# Patient Record
Sex: Female | Born: 1945 | Race: White | Hispanic: No | Marital: Married | Smoking: Never smoker
Health system: Southern US, Community
[De-identification: ages and names within clinical notes are randomized; demographics above are authoritative.]

## PROBLEM LIST (undated history)

## (undated) DIAGNOSIS — R569 Unspecified convulsions: Secondary | ICD-10-CM

## (undated) DIAGNOSIS — K219 Gastro-esophageal reflux disease without esophagitis: Secondary | ICD-10-CM

## (undated) DIAGNOSIS — F32A Depression, unspecified: Secondary | ICD-10-CM

## (undated) DIAGNOSIS — R000 Tachycardia, unspecified: Secondary | ICD-10-CM

## (undated) DIAGNOSIS — M3500 Sicca syndrome, unspecified: Secondary | ICD-10-CM

## (undated) DIAGNOSIS — I7300 Raynaud's syndrome without gangrene: Secondary | ICD-10-CM

## (undated) DIAGNOSIS — M1990 Unspecified osteoarthritis, unspecified site: Secondary | ICD-10-CM

## (undated) DIAGNOSIS — I10 Essential (primary) hypertension: Secondary | ICD-10-CM

## (undated) DIAGNOSIS — J189 Pneumonia, unspecified organism: Secondary | ICD-10-CM

## (undated) DIAGNOSIS — I517 Cardiomegaly: Secondary | ICD-10-CM

## (undated) DIAGNOSIS — E039 Hypothyroidism, unspecified: Secondary | ICD-10-CM

## (undated) HISTORY — PX: SALPINGOOPHORECTOMY: SHX82

## (undated) HISTORY — DX: Tachycardia, unspecified: R00.0

## (undated) HISTORY — PX: WISDOM TOOTH EXTRACTION: SHX21

## (undated) HISTORY — DX: Raynaud's syndrome without gangrene: I73.00

## (undated) HISTORY — DX: Depression, unspecified: F32.A

## (undated) HISTORY — DX: Unspecified convulsions: R56.9

## (undated) HISTORY — DX: Unspecified osteoarthritis, unspecified site: M19.90

## (undated) HISTORY — PX: NASAL SEPTUM SURGERY: SHX37

## (undated) HISTORY — DX: Pneumonia, unspecified organism: J18.9

## (undated) HISTORY — DX: Sjogren syndrome, unspecified: M35.00

## (undated) HISTORY — DX: Cardiomegaly: I51.7

## (undated) HISTORY — DX: Hypothyroidism, unspecified: E03.9

## (undated) HISTORY — PX: OVARIAN CYST REMOVAL: SHX89

---

## 1998-04-05 ENCOUNTER — Other Ambulatory Visit: Admission: RE | Admit: 1998-04-05 | Discharge: 1998-04-05 | Payer: Self-pay | Admitting: Obstetrics and Gynecology

## 1999-05-12 ENCOUNTER — Encounter: Payer: Self-pay | Admitting: Emergency Medicine

## 1999-05-12 ENCOUNTER — Emergency Department (HOSPITAL_COMMUNITY): Admission: EM | Admit: 1999-05-12 | Discharge: 1999-05-12 | Payer: Self-pay | Admitting: Emergency Medicine

## 1999-05-22 ENCOUNTER — Encounter: Payer: Self-pay | Admitting: Otolaryngology

## 1999-05-22 ENCOUNTER — Encounter: Admission: RE | Admit: 1999-05-22 | Discharge: 1999-05-22 | Payer: Self-pay | Admitting: Otolaryngology

## 1999-07-03 ENCOUNTER — Other Ambulatory Visit
Admission: RE | Admit: 1999-07-03 | Discharge: 1999-07-03 | Payer: Self-pay | Admitting: Physical Medicine & Rehabilitation

## 2000-08-11 ENCOUNTER — Other Ambulatory Visit: Admission: RE | Admit: 2000-08-11 | Discharge: 2000-08-11 | Payer: Self-pay | Admitting: Obstetrics and Gynecology

## 2000-08-14 ENCOUNTER — Encounter: Payer: Self-pay | Admitting: Obstetrics and Gynecology

## 2000-08-14 ENCOUNTER — Encounter: Admission: RE | Admit: 2000-08-14 | Discharge: 2000-08-14 | Payer: Self-pay | Admitting: Obstetrics and Gynecology

## 2001-08-26 ENCOUNTER — Other Ambulatory Visit: Admission: RE | Admit: 2001-08-26 | Discharge: 2001-08-26 | Payer: Self-pay | Admitting: Obstetrics and Gynecology

## 2002-10-21 ENCOUNTER — Encounter: Payer: Self-pay | Admitting: Internal Medicine

## 2002-10-21 ENCOUNTER — Ambulatory Visit (HOSPITAL_COMMUNITY): Admission: RE | Admit: 2002-10-21 | Discharge: 2002-10-21 | Payer: Self-pay | Admitting: Internal Medicine

## 2003-04-02 ENCOUNTER — Emergency Department (HOSPITAL_COMMUNITY): Admission: EM | Admit: 2003-04-02 | Discharge: 2003-04-02 | Payer: Self-pay | Admitting: Emergency Medicine

## 2003-04-09 ENCOUNTER — Emergency Department (HOSPITAL_COMMUNITY): Admission: EM | Admit: 2003-04-09 | Discharge: 2003-04-10 | Payer: Self-pay | Admitting: Emergency Medicine

## 2003-04-19 ENCOUNTER — Other Ambulatory Visit: Admission: RE | Admit: 2003-04-19 | Discharge: 2003-04-19 | Payer: Self-pay | Admitting: Obstetrics and Gynecology

## 2003-05-15 ENCOUNTER — Encounter: Admission: RE | Admit: 2003-05-15 | Discharge: 2003-05-15 | Payer: Self-pay | Admitting: Obstetrics and Gynecology

## 2004-04-05 ENCOUNTER — Emergency Department (HOSPITAL_COMMUNITY): Admission: EM | Admit: 2004-04-05 | Discharge: 2004-04-05 | Payer: Self-pay | Admitting: Emergency Medicine

## 2005-02-20 ENCOUNTER — Other Ambulatory Visit: Admission: RE | Admit: 2005-02-20 | Discharge: 2005-02-20 | Payer: Self-pay | Admitting: Obstetrics and Gynecology

## 2005-03-03 ENCOUNTER — Encounter: Admission: RE | Admit: 2005-03-03 | Discharge: 2005-03-03 | Payer: Self-pay | Admitting: Obstetrics and Gynecology

## 2005-10-08 ENCOUNTER — Emergency Department (HOSPITAL_COMMUNITY): Admission: EM | Admit: 2005-10-08 | Discharge: 2005-10-08 | Payer: Self-pay | Admitting: Emergency Medicine

## 2007-02-09 HISTORY — PX: HAND TENDON SURGERY: SHX663

## 2007-09-06 ENCOUNTER — Other Ambulatory Visit: Admission: RE | Admit: 2007-09-06 | Discharge: 2007-09-06 | Payer: Self-pay | Admitting: Obstetrics and Gynecology

## 2007-12-20 ENCOUNTER — Ambulatory Visit: Payer: Self-pay | Admitting: Infectious Disease

## 2007-12-20 DIAGNOSIS — J328 Other chronic sinusitis: Secondary | ICD-10-CM

## 2007-12-20 DIAGNOSIS — L719 Rosacea, unspecified: Secondary | ICD-10-CM | POA: Insufficient documentation

## 2007-12-20 LAB — CONVERTED CEMR LAB
BUN: 18 mg/dL (ref 6–23)
Basophils Relative: 0 % (ref 0–1)
CO2: 22 meq/L (ref 19–32)
Calcium: 9.4 mg/dL (ref 8.4–10.5)
Creatinine, Ser: 1.25 mg/dL — ABNORMAL HIGH (ref 0.40–1.20)
HCT: 45.2 % (ref 36.0–46.0)
Hemoglobin: 14.7 g/dL (ref 12.0–15.0)
Monocytes Absolute: 0.4 10*3/uL (ref 0.1–1.0)
Monocytes Relative: 8 % (ref 3–12)
Neutro Abs: 3.2 10*3/uL (ref 1.7–7.7)
Neutrophils Relative %: 59 % (ref 43–77)
Potassium: 4.3 meq/L (ref 3.5–5.3)
RBC: 4.98 M/uL (ref 3.87–5.11)

## 2008-02-14 ENCOUNTER — Encounter: Admission: RE | Admit: 2008-02-14 | Discharge: 2008-02-14 | Payer: Self-pay | Admitting: Cardiology

## 2008-02-21 ENCOUNTER — Encounter: Admission: RE | Admit: 2008-02-21 | Discharge: 2008-02-21 | Payer: Self-pay | Admitting: Cardiology

## 2008-03-10 ENCOUNTER — Encounter: Admission: RE | Admit: 2008-03-10 | Discharge: 2008-03-10 | Payer: Self-pay | Admitting: Cardiology

## 2010-01-06 DIAGNOSIS — Z8719 Personal history of other diseases of the digestive system: Secondary | ICD-10-CM

## 2010-01-06 HISTORY — DX: Personal history of other diseases of the digestive system: Z87.19

## 2010-01-07 ENCOUNTER — Emergency Department (HOSPITAL_COMMUNITY)
Admission: EM | Admit: 2010-01-07 | Discharge: 2010-01-07 | Payer: Self-pay | Source: Home / Self Care | Admitting: Emergency Medicine

## 2010-02-26 ENCOUNTER — Encounter: Payer: Self-pay | Admitting: Internal Medicine

## 2010-02-26 ENCOUNTER — Ambulatory Visit (INDEPENDENT_AMBULATORY_CARE_PROVIDER_SITE_OTHER): Payer: BC Managed Care – PPO | Admitting: Internal Medicine

## 2010-02-26 DIAGNOSIS — G43909 Migraine, unspecified, not intractable, without status migrainosus: Secondary | ICD-10-CM | POA: Insufficient documentation

## 2010-02-26 DIAGNOSIS — S62109A Fracture of unspecified carpal bone, unspecified wrist, initial encounter for closed fracture: Secondary | ICD-10-CM

## 2010-02-26 DIAGNOSIS — R634 Abnormal weight loss: Secondary | ICD-10-CM | POA: Insufficient documentation

## 2010-02-26 DIAGNOSIS — S62102A Fracture of unspecified carpal bone, left wrist, initial encounter for closed fracture: Secondary | ICD-10-CM | POA: Insufficient documentation

## 2010-02-26 DIAGNOSIS — E039 Hypothyroidism, unspecified: Secondary | ICD-10-CM

## 2010-02-26 DIAGNOSIS — I517 Cardiomegaly: Secondary | ICD-10-CM | POA: Insufficient documentation

## 2010-02-26 DIAGNOSIS — D1803 Hemangioma of intra-abdominal structures: Secondary | ICD-10-CM

## 2010-02-26 DIAGNOSIS — K222 Esophageal obstruction: Secondary | ICD-10-CM | POA: Insufficient documentation

## 2010-02-26 MED ORDER — METOPROLOL SUCCINATE ER 25 MG PO TB24: ORAL_TABLET | ORAL | Status: DC

## 2010-02-26 MED ORDER — LEVOTHYROXINE SODIUM 112 MCG PO TABS: 112.0000 ug | ORAL_TABLET | Freq: Every day | ORAL | Status: DC

## 2010-02-26 MED ORDER — OMEPRAZOLE 40 MG PO CPDR: 40.0000 mg | DELAYED_RELEASE_CAPSULE | Freq: Every day | ORAL | Status: DC

## 2010-02-26 NOTE — Assessment & Plan Note (Signed)
Stable dose of synthroid. Obtain tsh.

## 2010-02-26 NOTE — Progress Notes (Signed)
  Subjective:    Patient ID: Christy Moore, female    DOB: June 19, 1945, 65 y.o.   MRN: [id number]  HPI Pt presents to clinic to establish primary care. Notes 1.5 month h/o decreased appetite and ~5lb wt loss. May have noted intermittent slight nausea without emesis and lighter stools.Denis abd pain or blood in stool.  H/o hypothyroidism followed by endocrinology (states physician is retiring) with stable reported tsh and synthroid dose. Last lab evaluation ~6 months ago. H/o migraines with aura taking topamax followed by [facility] neurology. Followed by cardiology with h/o ST and cardiomegaly maintained on low dose beta blocker. Recalls EGD ?12/10 with esophageal stricture s/p dilatation and denies dysphagia. Colonoscopy utd 2y ago with reported 5y followup. Now seeing gyn for pap smears and mammogram utd within 1y and reportedly nl.   Reivewed PMH, PSH, medications, allergies, social hx and family hx.    Review of Systems  Constitutional: Positive for appetite change and unexpected weight change. Negative for fever, chills and fatigue.  HENT: Negative for ear pain, congestion and sore throat.   Eyes: Negative for pain and redness.  Respiratory: Negative for cough and shortness of breath.   Cardiovascular: Negative for chest pain and palpitations.  Gastrointestinal: Positive for nausea. Negative for abdominal pain, constipation and blood in stool.  Genitourinary: Negative for hematuria, decreased urine volume and difficulty urinating.  Musculoskeletal: Negative for back pain and gait problem.  Skin: Negative for color change and rash.  Neurological: Positive for headaches. Negative for dizziness, tremors and seizures.  Hematological: Negative for adenopathy. Does not bruise/bleed easily.  Psychiatric/Behavioral: Negative for behavioral problems and agitation.       Objective:   Physical Exam  Constitutional: She appears well-developed and well-nourished. No distress.  HENT:  Head:  Normocephalic and atraumatic.  Right Ear: External ear normal.  Left Ear: External ear normal.  Nose: Nose normal.  Mouth/Throat: Oropharynx is clear and moist. No oropharyngeal exudate.  Eyes: Conjunctivae and EOM are normal. Pupils are equal, round, and reactive to light. Right eye exhibits no discharge. Left eye exhibits no discharge. No scleral icterus.  Neck: Neck supple. No JVD present. No thyromegaly present.  Cardiovascular: Normal rate, regular rhythm and normal heart sounds.  Exam reveals no gallop and no friction rub.   No murmur heard. Pulmonary/Chest: Effort normal and breath sounds normal. No respiratory distress. She has no wheezes. She has no rales.  Abdominal: Soft. Bowel sounds are normal. She exhibits no distension and no mass. There is no hepatosplenomegaly. There is tenderness in the epigastric area. There is no rigidity, no rebound and no guarding.         Sl tenderness to palpation epigastric area  Musculoskeletal: She exhibits no edema.  Lymphadenopathy:    She has no cervical adenopathy.  Neurological: She is alert. Gait normal.  Skin: Skin is warm and dry. No rash noted. She is not diaphoretic. No erythema.  Psychiatric: She has a normal mood and affect. Her behavior is normal.          Assessment & Plan:

## 2010-02-26 NOTE — Assessment & Plan Note (Signed)
Mild. Associated mild nausea and decreased appetite. Increase omeprazole 40mg  po qd. Obtain cbc, chem7, lft and tsh.

## 2010-02-27 LAB — BASIC METABOLIC PANEL
BUN: 17 mg/dL (ref 6–23)
CO2: 27 mEq/L (ref 19–32)
Chloride: 110 mEq/L (ref 96–112)
Creatinine, Ser: 1 mg/dL (ref 0.4–1.2)
GFR: 60.92 mL/min (ref >60.00)
Glucose, Bld: 83 mg/dL (ref 70–99)
Potassium: 4.8 mEq/L (ref 3.5–5.1)

## 2010-02-27 LAB — CBC WITH DIFFERENTIAL/PLATELET
Basophils Relative: 0.3 % (ref 0.0–3.0)
HCT: 42.8 % (ref 36.0–46.0)
Hemoglobin: 14.6 g/dL (ref 12.0–15.0)
Lymphocytes Relative: 18.4 % (ref 12.0–46.0)
Lymphs Abs: 1.5 10*3/uL (ref 0.7–4.0)
MCHC: 34 g/dL (ref 30.0–36.0)
MCV: 93.5 fl (ref 78.0–100.0)

## 2010-02-27 LAB — TSH: TSH: 0.94 u[IU]/mL (ref 0.35–5.50)

## 2010-02-27 LAB — HEPATIC FUNCTION PANEL: Total Protein: 6.9 g/dL (ref 6.0–8.3)

## 2010-02-28 ENCOUNTER — Telehealth: Payer: Self-pay | Admitting: Internal Medicine

## 2010-02-28 NOTE — Telephone Encounter (Signed)
Triage vm----wants to speak with a nurse about the medication change and the sxs. Please return call.

## 2010-03-01 NOTE — Telephone Encounter (Signed)
Left message on machine return their call

## 2010-03-01 NOTE — Telephone Encounter (Signed)
Left message on machine returning patient's call 

## 2010-03-04 ENCOUNTER — Encounter: Payer: Self-pay | Admitting: *Deleted

## 2010-03-04 ENCOUNTER — Ambulatory Visit: Payer: BC Managed Care – PPO | Admitting: Internal Medicine

## 2010-03-05 ENCOUNTER — Encounter: Payer: Self-pay | Admitting: Internal Medicine

## 2010-03-05 ENCOUNTER — Ambulatory Visit (INDEPENDENT_AMBULATORY_CARE_PROVIDER_SITE_OTHER): Payer: BC Managed Care – PPO | Admitting: Internal Medicine

## 2010-03-05 DIAGNOSIS — R634 Abnormal weight loss: Secondary | ICD-10-CM

## 2010-03-05 MED ORDER — PANTOPRAZOLE SODIUM 40 MG PO TBEC: 40.0000 mg | DELAYED_RELEASE_TABLET | Freq: Every day | ORAL | Status: DC

## 2010-03-05 NOTE — Progress Notes (Signed)
  Subjective:    Patient ID: Christy Moore, female    DOB: Nov 14, 1945, 65 y.o.   MRN: [id number]  HPI  Pt presents to clinic for followup of wt loss. Last visit noted unintentional wt loss, nausea and abdominal discomfort. Omeprazole dose increased to 40mg  qd but pt noted increased gas production and no improvement of sx's. Attempt tums yesterday with resolution of sx's within . This am noted diarrhea without blood. Reviewed nl cbc, chem7, lft and tsh with pt.  Reviewed PMH, medications and allergies.    Review of Systems See HPI     Objective:   Physical Exam  Constitutional: She appears well-developed and well-nourished. No distress.  HENT:  Head: Normocephalic and atraumatic.  Right Ear: External ear normal.  Left Ear: External ear normal.  Eyes: Conjunctivae are normal. No scleral icterus.  Abdominal: Soft. Normal appearance and bowel sounds are normal. She exhibits no distension, no ascites and no mass. There is no hepatosplenomegaly. There is tenderness in the epigastric area and periumbilical area. There is no rigidity, no rebound and no guarding.  Neurological: She is alert.  Skin: Skin is warm and dry. She is not diaphoretic. No erythema.  Psychiatric: She has a normal mood and affect.          Assessment & Plan:

## 2010-03-05 NOTE — Assessment & Plan Note (Signed)
Failed to improve with increased dose of omeprazole but improvement with antacid. Change omeprazole to protonix 40mg  qd. If no significant improvement of sx's within 3 wks will recommend GI consult.

## 2010-03-27 ENCOUNTER — Other Ambulatory Visit: Payer: Self-pay | Admitting: Internal Medicine

## 2010-03-27 ENCOUNTER — Telehealth: Payer: Self-pay | Admitting: *Deleted

## 2010-03-27 DIAGNOSIS — R634 Abnormal weight loss: Secondary | ICD-10-CM

## 2010-03-27 NOTE — Telephone Encounter (Signed)
Pt needs to  Make sure this referral has been made to Dr. Kinnie Scales as that is her MD>

## 2010-03-27 NOTE — Telephone Encounter (Signed)
Pt wants Dr. Rodena Medin to know that the Protonix is helping her pain, but she is still losing weight and has a crawling sensation in her esphagus.  Is taking Prednisonepack  This week and is causing the GERD to be worse.

## 2010-03-27 NOTE — Telephone Encounter (Signed)
Will schedule gi consult as discussed last visit

## 2010-06-04 ENCOUNTER — Ambulatory Visit (INDEPENDENT_AMBULATORY_CARE_PROVIDER_SITE_OTHER): Payer: BC Managed Care – PPO | Admitting: Internal Medicine

## 2010-06-04 ENCOUNTER — Encounter: Payer: Self-pay | Admitting: Internal Medicine

## 2010-06-04 DIAGNOSIS — R634 Abnormal weight loss: Secondary | ICD-10-CM

## 2010-06-09 ENCOUNTER — Encounter: Payer: Self-pay | Admitting: Internal Medicine

## 2010-06-09 NOTE — Progress Notes (Signed)
  Subjective:    Patient ID: DELENA CASEBEER, female    DOB: [birth year], 65 y.o.   MRN: [id number]  HPI Patient presents to clinic for followup of weight loss. Patient now without abdominal pain nausea and weight has stabilized. Status post GI consultation. Stopped clindamycin and believes his symptoms resolved at that point. Laboratory evaluation unrevealing. Colonoscopy up-to-date and EGD December 2010. Blood pressure reviewed and her average control. No current active complaints.    Review of Systems  Constitutional: Negative for fever, fatigue and unexpected weight change.  Gastrointestinal: Negative for nausea, abdominal pain, blood in stool and abdominal distention.  Skin: Negative for color change and rash.       Objective:   Physical Exam    Physical Exam  Vitals reviewed. Constitutional:  appears well-developed and well-nourished. No distress.  HENT:  Head: Normocephalic and atraumatic.  Nose: Nose normal.  Mouth/Throat: Oropharynx is clear and moist. No oropharyngeal exudate.  Eyes: Conjunctivae and EOM are normal. Pupils are equal, round, and reactive to light. Right eye exhibits no discharge. Left eye exhibits no discharge. No scleral icterus.  Neck: Neck supple. No thyromegaly present.  Cardiovascular: Normal rate, regular rhythm and normal heart sounds.  Exam reveals no gallop and no friction rub.   No murmur heard. Pulmonary/Chest: Effort normal and breath sounds normal. No respiratory distress.  has no wheezes.  has no rales.  Lymphadenopathy:   no cervical adenopathy.  Neurological:  is alert.  Skin: Skin is warm and dry.  not diaphoretic.  Psychiatric: normal mood and affect.      Assessment & Plan:

## 2010-06-09 NOTE — Assessment & Plan Note (Signed)
Resolved. Patient relates to possible medication side effect. Continue to monitor weight. Followup as scheduled for routine care

## 2010-11-07 ENCOUNTER — Telehealth: Payer: Self-pay | Admitting: Cardiology

## 2010-11-07 NOTE — Telephone Encounter (Signed)
New message  Pt called. She said her right hand lost blood circulation. She thinks it is reymauds syndrome.  please call her back.

## 2010-11-07 NOTE — Telephone Encounter (Signed)
lm

## 2010-11-07 NOTE — Telephone Encounter (Signed)
Follow up: pt returned your call.  Please call cell [phone]

## 2010-11-08 MED ORDER — AMLODIPINE BESYLATE 2.5 MG PO TABS: 2.5000 mg | ORAL_TABLET | Freq: Every day | ORAL | Status: DC

## 2010-11-08 NOTE — Telephone Encounter (Signed)
Advised patient

## 2010-11-08 NOTE — Telephone Encounter (Signed)
Called stating (R) hand went "white" and numb,cold this AM. 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 Now at 3:45 hand is not numb but is still cold. Thinks she may have Raynaud's syndrome. Is now taking Cambia 50 mg poweder when she gets migranes. States doctors at surgical center suggested she take Norvasc 2.5 instead of Metoprol XL 25 mg 1/2 tab BID. Will route to [place] to talk to Dr. Patty Sermons.

## 2010-11-08 NOTE — Telephone Encounter (Signed)
Agree that amlodipine would be preferable in this situation.

## 2010-12-05 ENCOUNTER — Ambulatory Visit: Payer: BC Managed Care – PPO | Admitting: Internal Medicine

## 2011-02-03 ENCOUNTER — Telehealth: Payer: Self-pay | Admitting: Internal Medicine

## 2011-02-03 NOTE — Telephone Encounter (Signed)
Rx refill denied office visit needed. 

## 2011-02-04 ENCOUNTER — Telehealth: Payer: Self-pay | Admitting: Internal Medicine

## 2011-02-05 MED ORDER — PANTOPRAZOLE SODIUM 40 MG PO TBEC: 40.0000 mg | DELAYED_RELEASE_TABLET | Freq: Every day | ORAL | Status: DC

## 2011-02-05 NOTE — Telephone Encounter (Signed)
Office visit due June 2013.

## 2011-03-17 ENCOUNTER — Telehealth: Payer: Self-pay | Admitting: Family Medicine

## 2011-03-17 MED ORDER — METOPROLOL SUCCINATE ER 25 MG PO TB24: ORAL_TABLET | ORAL | Status: DC

## 2011-03-17 NOTE — Telephone Encounter (Signed)
Refill- metoprolol succ er 25mg  tab. Take 1/2 tablet twice a day. Qty 90 last fill 9.9.12

## 2011-03-17 NOTE — Telephone Encounter (Signed)
Rx refill sent to pharmacy office visit needed for future refills 

## 2011-03-19 ENCOUNTER — Encounter: Payer: Self-pay | Admitting: Family Medicine

## 2011-03-19 ENCOUNTER — Ambulatory Visit (INDEPENDENT_AMBULATORY_CARE_PROVIDER_SITE_OTHER): Payer: BC Managed Care – PPO | Admitting: Family Medicine

## 2011-03-19 VITALS — BP 130/88 | Temp 98.2°F | Wt 156.0 lb

## 2011-03-19 DIAGNOSIS — E039 Hypothyroidism, unspecified: Secondary | ICD-10-CM

## 2011-03-19 DIAGNOSIS — K219 Gastro-esophageal reflux disease without esophagitis: Secondary | ICD-10-CM | POA: Insufficient documentation

## 2011-03-19 DIAGNOSIS — I7300 Raynaud's syndrome without gangrene: Secondary | ICD-10-CM

## 2011-03-19 DIAGNOSIS — H04129 Dry eye syndrome of unspecified lacrimal gland: Secondary | ICD-10-CM

## 2011-03-19 DIAGNOSIS — H04123 Dry eye syndrome of bilateral lacrimal glands: Secondary | ICD-10-CM

## 2011-03-19 DIAGNOSIS — R682 Dry mouth, unspecified: Secondary | ICD-10-CM

## 2011-03-19 DIAGNOSIS — M2550 Pain in unspecified joint: Secondary | ICD-10-CM

## 2011-03-19 DIAGNOSIS — K117 Disturbances of salivary secretion: Secondary | ICD-10-CM

## 2011-03-19 DIAGNOSIS — I10 Essential (primary) hypertension: Secondary | ICD-10-CM | POA: Insufficient documentation

## 2011-03-19 NOTE — Progress Notes (Addendum)
Subjective:    Patient ID: Christy Moore, female    DOB: 1945-12-16, 66 y.o.   MRN: [id number]  HPI  Medical followup. Establishing with me. Past medical history significant for GERD, history of esophageal stricture, hypothyroidism, migraine headaches, rosacea. Medications reviewed. Needs followup lab work. GERD symptoms stable on protonix. Compliant with all medications. Migraines stable. She has history of Raynaud's syndrome which has been improved with amlodipine.  She complains of progressive symptoms of dry eyes and mouth. Also has dry skin. She is concerned about possible Sjogren's syndrome. She has never been tested. No history of parotid enlargement. No recent esophageal problems.  She does have occasional arthralgias which are somewhat bilateral but no objective evidence of inflammation such as redness or warmth.  Past Medical History  Diagnosis Date  . Hypothyroidism   . Sinus tachycardia   . Cardiomegaly    Past Surgical History  Procedure Date  . Hand tendon surgery 02/09/2007    tendon transfer-Dr. Amanda Pea  . Ovarian cyst removal     Right Sided Salpingo-oopherectomy  . Nasal septum surgery     operated on by Dr. Haroldine Laws 20 years ago  . Salpingoophorectomy     reports that she has never smoked. She does not have any smokeless tobacco history on file. She reports that she does not drink alcohol or use illicit drugs. family history includes Alcohol abuse in her maternal grandfather and maternal grandmother; Allergies in her other; and Cancer in her father and mother. Allergies  Allergen Reactions  . Azithromycin     REACTION: nausea, Gi  . Celecoxib     REACTION: nausea  . Ciprofloxacin     REACTION: nausea  . Doxycycline     REACTION: gi upset  . Eggs Or Egg-Derived Products   . Erythromycin     REACTION: nause, GI  . Hydrocodone     REACTION: nausea  . Hydrocodone-Acetaminophen     REACTION: GI upset  . WUJ:[id number]+[id number]+QIONGEXBMW Acid+Aspartame       REACTION: nausea  . Levofloxacin     REACTION: she claims renal failiure  . Propoxyphene N-Acetaminophen   . Rofecoxib     REACTION: nausea  . Tetanus Toxoid       Review of Systems  Constitutional: Positive for fatigue. Negative for fever, chills and appetite change.  HENT: Negative for congestion, sore throat and voice change.   Eyes: Negative for pain, redness and visual disturbance.  Respiratory: Negative for cough and shortness of breath.   Cardiovascular: Negative for chest pain, palpitations and leg swelling.  Gastrointestinal: Negative for nausea, vomiting, abdominal pain and diarrhea.  Genitourinary: Negative for dysuria.  Neurological: Negative for dizziness and headaches.       Objective:   Physical Exam  Constitutional: She is oriented to person, place, and time. She appears well-developed and well-nourished.  HENT:  Mouth/Throat: Oropharynx is clear and moist.  Neck: Neck supple. No thyromegaly present.  Cardiovascular: Normal rate and regular rhythm.   Pulmonary/Chest: Effort normal and breath sounds normal. No respiratory distress. She has no wheezes. She has no rales.  Musculoskeletal: She exhibits no edema and no tenderness.  Lymphadenopathy:    She has no cervical adenopathy.  Neurological: She is alert and oriented to person, place, and time. No cranial nerve deficit.  Skin: No rash noted.  Psychiatric: She has a normal mood and affect. Her behavior is normal.          Assessment & Plan:  #1 hypothyroidism. Recheck TSH #2 history  of GERD with esophageal stricture stable. Continue proton pump inhibitor #3 progressive symptoms of dry eyes and dry mouth. Rule out Sjogren syndrome. Check anti-nuclear antibodies, rheumatoid factor, and anti-SSA and anti-SSB antibodies #4 Raynaud's syndrome stable on calcium channel blocker  Patient has been notified of lab results. Negative rheumatoid factor. Sjogren's antibodies negative positive ANA with high titer  and speckled pattern. Patient has history of Raynaud's disease, arthralgias, and progressive dry eyes and dry mouth. She is requesting a rheumatology referral and we will set up

## 2011-03-20 LAB — BASIC METABOLIC PANEL
BUN: 14 mg/dL (ref 6–23)
Calcium: 9.6 mg/dL (ref 8.4–10.5)
Creatinine, Ser: 0.9 mg/dL (ref 0.4–1.2)
GFR: 66.98 mL/min (ref >60.00)
Potassium: 4.1 mEq/L (ref 3.5–5.1)

## 2011-03-20 LAB — ANTI-NUCLEAR AB-TITER (ANA TITER): ANA Titer 1: >1:2560 {titer} — ABNORMAL HIGH

## 2011-03-20 LAB — SJOGRENS SYNDROME-A EXTRACTABLE NUCLEAR ANTIBODY: SSA (Ro) (ENA) Antibody, IgG: 22 AU/mL (ref <30)

## 2011-03-20 LAB — SJOGRENS SYNDROME-B EXTRACTABLE NUCLEAR ANTIBODY: SSB (La) (ENA) Antibody, IgG: <1 AU/mL (ref <30)

## 2011-03-26 ENCOUNTER — Other Ambulatory Visit: Payer: Self-pay | Admitting: Family Medicine

## 2011-03-26 DIAGNOSIS — E039 Hypothyroidism, unspecified: Secondary | ICD-10-CM

## 2011-03-26 MED ORDER — LEVOTHYROXINE SODIUM 100 MCG PO TABS: 100.0000 ug | ORAL_TABLET | Freq: Every day | ORAL | Status: DC

## 2011-03-26 NOTE — Progress Notes (Signed)
Quick Note:  Pt informed, labs mailed to home future orders placed, Rx sent Pt would like rheumatology referral ______

## 2011-03-27 NOTE — Progress Notes (Signed)
Addended by: Kristian Covey on: 03/27/2011 07:56 AM   Modules accepted: Orders

## 2011-08-08 ENCOUNTER — Other Ambulatory Visit: Payer: Self-pay | Admitting: Internal Medicine

## 2011-08-11 NOTE — Telephone Encounter (Signed)
Refill for 1 year 

## 2011-08-14 ENCOUNTER — Telehealth: Payer: Self-pay | Admitting: Family Medicine

## 2011-08-14 ENCOUNTER — Ambulatory Visit (INDEPENDENT_AMBULATORY_CARE_PROVIDER_SITE_OTHER): Payer: BC Managed Care – PPO | Admitting: Emergency Medicine

## 2011-08-14 VITALS — BP 133/90 | HR 97 | Temp 98.8°F | Resp 18 | Ht 64.0 in | Wt 155.0 lb

## 2011-08-14 DIAGNOSIS — L02419 Cutaneous abscess of limb, unspecified: Secondary | ICD-10-CM

## 2011-08-14 DIAGNOSIS — L03119 Cellulitis of unspecified part of limb: Secondary | ICD-10-CM

## 2011-08-14 MED ORDER — CLINDAMYCIN HCL 300 MG PO CAPS: 300.0000 mg | ORAL_CAPSULE | Freq: Three times a day (TID) | ORAL | Status: AC

## 2011-08-14 NOTE — Telephone Encounter (Signed)
Please advise 

## 2011-08-14 NOTE — Telephone Encounter (Signed)
Pt called and said that she is going to go to an Urgent Care re: foot, since she hasn't rcvd a call back yet. Pt said that she will send Dr Caryl Never info.

## 2011-08-14 NOTE — Telephone Encounter (Signed)
Pt called and said that CAN was not avail. Pt said that she went to see Dr Thomasena Edis -Orthopedic surgeon re: inflamed and swollen lft foot and ankle. Dx with ? Cellulitis or flebitus. Need to get abx asap called in to [facility] on [address].

## 2011-08-14 NOTE — Patient Instructions (Signed)

## 2011-08-14 NOTE — Progress Notes (Signed)
Date:  08/14/2011   Name:  KESHONDA MONSOUR   DOB:  10/12/1945   MRN:  [id number]  PCP:  Kristian Covey, MD    Chief Complaint: Foot Pain   History of Present Illness:  Christy Moore is a 66 y.o. very pleasant female patient who presents with the following:  Awoke with pain in foot and now has redness and warmth.  No fever or chills.  No history of injury.  Patient Active Problem List  Diagnosis  . OTHER CHRONIC SINUSITIS  . Rosacea  . Cardiomegaly  . Hypothyroidism  . Wrist fracture, left  . Liver hemangioma  . Migraine  . Esophageal stricture  . Weight loss  . GERD (gastroesophageal reflux disease)  . Hypertension  . Raynaud's disease    Past Medical History  Diagnosis Date  . Hypothyroidism   . Sinus tachycardia   . Cardiomegaly     Past Surgical History  Procedure Date  . Hand tendon surgery 02/09/2007    tendon transfer-Dr. Amanda Pea  . Ovarian cyst removal     Right Sided Salpingo-oopherectomy  . Nasal septum surgery     operated on by Dr. Haroldine Laws 20 years ago  . Salpingoophorectomy     History  Substance Use Topics  . Smoking status: Never Smoker   . Smokeless tobacco: Not on file  . Alcohol Use: No    Family History  Problem Relation Age of Onset  . Allergies Other     Grandmother-pt states she takes after grandmother who had multiple allergies  . Cancer Mother     breast and lung  . Cancer Father     prostate  . Alcohol abuse Maternal Grandmother   . Alcohol abuse Maternal Grandfather     Allergies  Allergen Reactions  . Amoxicillin-Pot Clavulanate     REACTION: nausea  . Azithromycin     REACTION: nausea, Gi  . Celecoxib     REACTION: nausea  . Ciprofloxacin     REACTION: nausea  . Doxycycline     REACTION: gi upset  . Eggs Or Egg-Derived Products   . Erythromycin     REACTION: nause, GI  . Hydrocodone     REACTION: nausea  . Hydrocodone-Acetaminophen     REACTION: GI upset  . Levofloxacin     REACTION: she claims  renal failiure  . Propoxyphene-Acetaminophen   . Rofecoxib     REACTION: nausea  . Tetanus Toxoid     Medication list has been reviewed and updated.  Current Outpatient Prescriptions on File Prior to Visit  Medication Sig Dispense Refill  . amLODipine (NORVASC) 2.5 MG tablet Take 1 tablet (2.5 mg total) by mouth daily.  90 tablet  3  . levothyroxine (SYNTHROID, LEVOTHROID) 100 MCG tablet Take 1 tablet (100 mcg total) by mouth daily.  90 tablet  3  . pantoprazole (PROTONIX) 40 MG tablet Take 1 tablet (40 mg total) by mouth daily.  30 tablet  11  . Diclofenac Potassium (CAMBIA) 50 MG PACK Take 50 mg by mouth as needed. Powder pack used as needed for migraine headache      . DISCONTD: metoprolol succinate (TOPROL-XL) 25 MG 24 hr tablet 1/2 tablet twice a day  90 tablet  0  . DISCONTD: mupirocin (BACTROBAN) 2 % nasal ointment by Nasal route 2 (two) times daily. Use one-half of tube in each nostril twice daily for five (5) days. After application, press sides of nose together and gently massage.       Marland Kitchen  DISCONTD: topiramate (TOPAMAX) 25 MG tablet Take 25 mg by mouth at bedtime.          Review of Systems:  As per HPI, otherwise negative.    Physical Examination: Filed Vitals:   08/14/11 1803  BP: 133/90  Pulse: 97  Temp: 98.8 F (37.1 C)  Resp: 18   Filed Vitals:   08/14/11 1803  Height: 5\' 4"  (1.626 m)  Weight: 155 lb (70.308 kg)   Body mass index is 26.61 kg/(m^2). Ideal Body Weight: Weight in (lb) to have BMI = 25: 145.3    GEN: WDWN, NAD, Non-toxic, Alert & Oriented x 3 HEENT: Atraumatic, Normocephalic.  Ears and Nose: No external deformity. EXTR: No clubbing/cyanosis/edema.. Cellulitis anterior ankle and lower leg NEURO: Normal gait.  PSYCH: Normally interactive. Conversant. Not depressed or anxious appearing.  Calm demeanor.    Assessment and Plan: Crutches nwb Elevate Clindamycin (allergies) Local heat Follow up as needed and with FMD on Monday  [name], MD

## 2011-08-18 ENCOUNTER — Encounter: Payer: Self-pay | Admitting: Radiology

## 2011-08-18 ENCOUNTER — Ambulatory Visit (INDEPENDENT_AMBULATORY_CARE_PROVIDER_SITE_OTHER): Payer: BC Managed Care – PPO | Admitting: Emergency Medicine

## 2011-08-18 VITALS — BP 110/62 | HR 102 | Temp 98.6°F | Resp 16 | Ht 64.0 in | Wt 156.0 lb

## 2011-08-18 DIAGNOSIS — L03119 Cellulitis of unspecified part of limb: Secondary | ICD-10-CM

## 2011-08-18 NOTE — Progress Notes (Deleted)
  Subjective:    Patient ID: Christy Moore, female    DOB: [birth year], 66 y.o.   MRN: [id number]  HPI    Review of Systems     Objective:   Physical Exam        Assessment & Plan:

## 2011-08-18 NOTE — Progress Notes (Signed)
Date:  08/18/2011   Name:  Christy Moore   DOB:  [birth year]   MRN:  [id number] Gender: female  Age: 66 y.o.  PCP:  Kristian Covey, MD    Chief Complaint: Wound Check   History of Present Illness:  Christy Moore is a 66 y.o. pleasant patient who presents with the following:  Follow up from visit.  Her cellulitis has completely resolved with antibiotics.    Patient Active Problem List  Diagnosis  . OTHER CHRONIC SINUSITIS  . Rosacea  . Cardiomegaly  . Hypothyroidism  . Wrist fracture, left  . Liver hemangioma  . Migraine  . Esophageal stricture  . Weight loss  . GERD (gastroesophageal reflux disease)  . Hypertension  . Raynaud's disease    Past Medical History  Diagnosis Date  . Hypothyroidism   . Sinus tachycardia   . Cardiomegaly     Past Surgical History  Procedure Date  . Hand tendon surgery 02/09/2007    tendon transfer-Dr. Amanda Pea  . Ovarian cyst removal     Right Sided Salpingo-oopherectomy  . Nasal septum surgery     operated on by Dr. Haroldine Laws 20 years ago  . Salpingoophorectomy     History  Substance Use Topics  . Smoking status: Never Smoker   . Smokeless tobacco: Not on file  . Alcohol Use: No    Family History  Problem Relation Age of Onset  . Allergies Other     Grandmother-pt states she takes after grandmother who had multiple allergies  . Cancer Mother     breast and lung  . Cancer Father     prostate  . Alcohol abuse Maternal Grandmother   . Alcohol abuse Maternal Grandfather     Allergies  Allergen Reactions  . Amoxicillin-Pot Clavulanate     REACTION: nausea  . Azithromycin     REACTION: nausea, Gi  . Celecoxib     REACTION: nausea  . Ciprofloxacin     REACTION: nausea  . Doxycycline     REACTION: gi upset  . Eggs Or Egg-Derived Products   . Erythromycin     REACTION: nause, GI  . Hydrocodone     REACTION: nausea  . Hydrocodone-Acetaminophen     REACTION: GI upset  . Levofloxacin     REACTION: she  claims renal failiure  . Propoxyphene-Acetaminophen   . Rofecoxib     REACTION: nausea  . Tetanus Toxoid     Medication list has been reviewed and updated.  Current Outpatient Prescriptions on File Prior to Visit  Medication Sig Dispense Refill  . amLODipine (NORVASC) 2.5 MG tablet Take 1 tablet (2.5 mg total) by mouth daily.  90 tablet  3  . clindamycin (CLEOCIN) 300 MG capsule Take 1 capsule (300 mg total) by mouth 3 (three) times daily.  30 capsule  0  . Diclofenac Potassium (CAMBIA) 50 MG PACK Take 50 mg by mouth as needed. Powder pack used as needed for migraine headache      . levothyroxine (SYNTHROID, LEVOTHROID) 100 MCG tablet Take 1 tablet (100 mcg total) by mouth daily.  90 tablet  3  . pantoprazole (PROTONIX) 40 MG tablet Take 1 tablet (40 mg total) by mouth daily.  30 tablet  11  . DISCONTD: metoprolol succinate (TOPROL-XL) 25 MG 24 hr tablet 1/2 tablet twice a day  90 tablet  0  . DISCONTD: mupirocin (BACTROBAN) 2 % nasal ointment by Nasal route 2 (two) times daily. Use one-half of tube in each  nostril twice daily for five (5) days. After application, press sides of nose together and gently massage.       Marland Kitchen DISCONTD: topiramate (TOPAMAX) 25 MG tablet Take 25 mg by mouth at bedtime.          Review of Systems:  As per HPI, otherwise negative.    Physical Examination: Filed Vitals:   08/18/11 1458  BP: 110/62  Pulse: 102  Temp: 98.6 F (37 C)  Resp: 16   Filed Vitals:   08/18/11 1458  Height: 5\' 4"  (1.626 m)  Weight: 156 lb (70.761 kg)   Body mass index is 26.78 kg/(m^2). Ideal Body Weight: Weight in (lb) to have BMI = 25: 145.3    GEN: WDWN, NAD, Non-toxic, Alert & Oriented x 3 HEENT: Atraumatic, Normocephalic.  Ears and Nose: No external deformity. EXTR: No clubbing/cyanosis/edema NEURO: Normal gait.  PSYCH: Normally interactive. Conversant. Not depressed or anxious appearing.  Calm demeanor.  EXT:  Cellulitis resolved  Assessment and Plan: Resolved  cellulitis  [name], MD

## 2011-09-05 ENCOUNTER — Other Ambulatory Visit: Payer: Self-pay | Admitting: Emergency Medicine

## 2011-09-10 ENCOUNTER — Other Ambulatory Visit: Payer: Self-pay | Admitting: Family Medicine

## 2011-09-10 NOTE — Telephone Encounter (Signed)
Pt is requesting pantoprazole 40 mg #90 with 3 refills [facility] [phone]

## 2011-09-11 MED ORDER — PANTOPRAZOLE SODIUM 40 MG PO TBEC: 40.0000 mg | DELAYED_RELEASE_TABLET | Freq: Every day | ORAL | Status: DC

## 2011-09-19 ENCOUNTER — Ambulatory Visit: Payer: BC Managed Care – PPO | Admitting: Family Medicine

## 2011-09-19 DIAGNOSIS — Z0289 Encounter for other administrative examinations: Secondary | ICD-10-CM

## 2011-09-23 ENCOUNTER — Telehealth: Payer: Self-pay | Admitting: Family Medicine

## 2011-09-23 NOTE — Telephone Encounter (Signed)
Caller: [name]/Patient; Patient Name: Christy Moore; PCP: Evelena Peat [facility] Best Callback Phone Number: [phone]; Reason for call: States that she has an egg allergy and has never been able to get a FLU shot. States that her company has purchased a "new FLU vaccine for people with egg allergies/immunocomprised/over 64 years of age", wants to know if Dr. Caryl Never thinks it would be safe for her to have this administered. OFFICE PLEASE FOLLOW UP WITH PATIENT/QUESTION.

## 2011-09-23 NOTE — Telephone Encounter (Signed)
Pls advise.  

## 2011-09-24 NOTE — Telephone Encounter (Signed)
I would need to know specifics about exactly which vaccine before I would be comfortable saying okay to give. If she to give his name and manufacturer I can research this

## 2011-09-24 NOTE — Telephone Encounter (Signed)
Called and spoke with pt and pt states she will fax the information about the vaccine to the office.

## 2011-10-01 ENCOUNTER — Ambulatory Visit (INDEPENDENT_AMBULATORY_CARE_PROVIDER_SITE_OTHER): Payer: BC Managed Care – PPO | Admitting: Emergency Medicine

## 2011-10-01 VITALS — BP 128/86 | HR 95 | Temp 99.2°F | Resp 16 | Ht 64.5 in | Wt 155.4 lb

## 2011-10-01 DIAGNOSIS — L0291 Cutaneous abscess, unspecified: Secondary | ICD-10-CM

## 2011-10-01 DIAGNOSIS — L0390 Cellulitis, unspecified: Secondary | ICD-10-CM

## 2011-10-01 MED ORDER — CLINDAMYCIN HCL 300 MG PO CAPS: 300.0000 mg | ORAL_CAPSULE | Freq: Three times a day (TID) | ORAL | Status: DC

## 2011-10-01 NOTE — Progress Notes (Signed)
Date:  10/01/2011   Name:  Christy Moore   DOB:  08-20-1945   MRN:  [id number] Gender: female Age: 66 y.o.  PCP:  Kristian Covey, MD    Chief Complaint: Rash   History of Present Illness:  Christy Moore is a 66 y.o. pleasant patient who presents with the following:  History of cellulitis in left leg.  Previously treated with clindamycin (allergic to sulfa).  Responded well and the infection recurred when the clinda was exhausted.  She had a prescription previously filled but not taken for clindamycin and she took that and the infection was eradicated.  Has no history of injury or overuse.  No wounds on foot.  No fever or chills. Just noticed a painful red area on distal anterior lower leg.    Patient Active Problem List  Diagnosis  . OTHER CHRONIC SINUSITIS  . Rosacea  . Cardiomegaly  . Hypothyroidism  . Wrist fracture, left  . Liver hemangioma  . Migraine  . Esophageal stricture  . Weight loss  . GERD (gastroesophageal reflux disease)  . Hypertension  . Raynaud's disease    Past Medical History  Diagnosis Date  . Hypothyroidism   . Sinus tachycardia   . Cardiomegaly     Past Surgical History  Procedure Date  . Hand tendon surgery 02/09/2007    tendon transfer-Dr. Amanda Pea  . Ovarian cyst removal     Right Sided Salpingo-oopherectomy  . Nasal septum surgery     operated on by Dr. Haroldine Laws 20 years ago  . Salpingoophorectomy     History  Substance Use Topics  . Smoking status: Never Smoker   . Smokeless tobacco: Not on file  . Alcohol Use: No    Family History  Problem Relation Age of Onset  . Allergies Other     Grandmother-pt states she takes after grandmother who had multiple allergies  . Cancer Mother     breast and lung  . Cancer Father     prostate  . Alcohol abuse Maternal Grandmother   . Alcohol abuse Maternal Grandfather     Allergies  Allergen Reactions  . Amoxicillin-Pot Clavulanate     REACTION: nausea  . Azithromycin    REACTION: nausea, Gi  . Celecoxib     REACTION: nausea  . Ciprofloxacin     REACTION: nausea  . Doxycycline     REACTION: gi upset  . Eggs Or Egg-Derived Products   . Erythromycin     REACTION: nause, GI  . Hydrocodone     REACTION: nausea  . Hydrocodone-Acetaminophen     REACTION: GI upset  . Levofloxacin     REACTION: she claims renal failiure  . Propoxyphene-Acetaminophen   . Rofecoxib     REACTION: nausea  . Tetanus Toxoid     Medication list has been reviewed and updated.  Outpatient Prescriptions Prior to Visit  Medication Sig Dispense Refill  . amLODipine (NORVASC) 2.5 MG tablet Take 1 tablet (2.5 mg total) by mouth daily.  90 tablet  3  . Diclofenac Potassium (CAMBIA) 50 MG PACK Take 50 mg by mouth as needed. Powder pack used as needed for migraine headache      . levothyroxine (SYNTHROID, LEVOTHROID) 100 MCG tablet Take 1 tablet (100 mcg total) by mouth daily.  90 tablet  3  . pantoprazole (PROTONIX) 40 MG tablet Take 1 tablet (40 mg total) by mouth daily.  90 tablet  3    Review of Systems:  As per HPI,  otherwise negative.    Physical Examination: Filed Vitals:   10/01/11 1654  BP: 128/86  Pulse: 95  Temp: 99.2 F (37.3 C)  Resp: 16   Filed Vitals:   10/01/11 1654  Height: 5' 4.5" (1.638 m)  Weight: 155 lb 6.4 oz (70.489 kg)   Body mass index is 26.26 kg/(m^2). Ideal Body Weight: Weight in (lb) to have BMI = 25: 147.6    GEN: WDWN, NAD, Non-toxic, Alert & Oriented x 3 HEENT: Atraumatic, Normocephalic.  Ears and Nose: No external deformity. EXTR: No clubbing/cyanosis/edema NEURO: Normal gait.  PSYCH: Normally interactive. Conversant. Not depressed or anxious appearing.  Calm demeanor.  LEG:  Area of cellulitis left lower leg.  No wounds on foot.  No FB.    Assessment and Plan: Cellulitis Clindamycin Stop shaving for time hibiclens shower.  [name], MD  I have reviewed and agree with documentation. Robert P. Merla Riches,  M.D.

## 2011-11-21 ENCOUNTER — Telehealth: Payer: Self-pay | Admitting: Cardiology

## 2011-11-21 NOTE — Telephone Encounter (Signed)
[facility] [id number] CALLING FOR REFILL REQUEST FOR NORVASC

## 2011-11-25 ENCOUNTER — Telehealth: Payer: Self-pay | Admitting: Family Medicine

## 2011-11-25 MED ORDER — AMLODIPINE BESYLATE 2.5 MG PO TABS: 2.5000 mg | ORAL_TABLET | Freq: Every day | ORAL | Status: DC

## 2011-11-25 NOTE — Telephone Encounter (Signed)
Refills OK for one year. 

## 2011-11-25 NOTE — Telephone Encounter (Signed)
Patient calling because she needs refills on Amlodipine 2.5mg . This was being rx'd by Dr. Patty Sermons in Cardiology. Per pt, when she started coming to see Dr. Caryl Never, she assumed her meds would just flow over to our office and he would rx, but she never specifically had the conversation with him. Dr. Patty Sermons is no longer managing the amlodipine, and she is out. She has not taken one today.  [facility] [address] - the med is in her chart for viewing.  She wants to know if we will fill it for her. Last OV with [place] 03/19/11 - she has upcoming cpx appt on 01/22/2012. Please advise.

## 2011-11-25 NOTE — Telephone Encounter (Signed)
Pt informed Rx sent. 

## 2012-01-22 ENCOUNTER — Encounter: Payer: Self-pay | Admitting: Family Medicine

## 2012-01-22 ENCOUNTER — Ambulatory Visit (INDEPENDENT_AMBULATORY_CARE_PROVIDER_SITE_OTHER): Payer: BC Managed Care – PPO | Admitting: Family Medicine

## 2012-01-22 VITALS — BP 120/80 | HR 72 | Temp 99.0°F | Resp 12 | Ht 64.25 in | Wt 158.0 lb

## 2012-01-22 DIAGNOSIS — M3500 Sicca syndrome, unspecified: Secondary | ICD-10-CM

## 2012-01-22 DIAGNOSIS — Z0000 Encounter for general adult medical examination without abnormal findings: Secondary | ICD-10-CM

## 2012-01-22 LAB — HEPATIC FUNCTION PANEL
ALT: 14 U/L (ref 0–35)
AST: 21 U/L (ref 0–37)
Albumin: 4.1 g/dL (ref 3.5–5.2)
Total Protein: 7.1 g/dL (ref 6.0–8.3)

## 2012-01-22 LAB — CBC WITH DIFFERENTIAL/PLATELET
Basophils Relative: 0.1 % (ref 0.0–3.0)
Eosinophils Absolute: 0.2 10*3/uL (ref 0.0–0.7)
Eosinophils Relative: 4 % (ref 0.0–5.0)
HCT: 43.1 % (ref 36.0–46.0)
Lymphs Abs: 0.9 10*3/uL (ref 0.7–4.0)
MCHC: 33.3 g/dL (ref 30.0–36.0)
MCV: 91.6 fl (ref 78.0–100.0)
Monocytes Absolute: 0.4 10*3/uL (ref 0.1–1.0)
Neutro Abs: 3.6 10*3/uL (ref 1.4–7.7)
RBC: 4.71 Mil/uL (ref 3.87–5.11)
WBC: 5.1 10*3/uL (ref 4.5–10.5)

## 2012-01-22 LAB — BASIC METABOLIC PANEL
BUN: 16 mg/dL (ref 6–23)
Calcium: 9.2 mg/dL (ref 8.4–10.5)
GFR: 68.56 mL/min (ref >60.00)
Glucose, Bld: 96 mg/dL (ref 70–99)
Potassium: 3.9 mEq/L (ref 3.5–5.1)
Sodium: 141 mEq/L (ref 135–145)

## 2012-01-22 LAB — LIPID PANEL
Cholesterol: 153 mg/dL (ref 0–200)
HDL: 45.9 mg/dL (ref >39.00)

## 2012-01-22 NOTE — Patient Instructions (Signed)
Schedule repeat mammogram

## 2012-01-22 NOTE — Progress Notes (Signed)
Subjective:    Patient ID: Christy Moore, female    DOB: 09/10/1945, 67 y.o.   MRN: [id number]  HPI Patient here for complete physical. Already had flu vaccine. Colonoscopy reportedly 3 years ago. Previous Pneumovax. This was 2 years ago. Previous adverse reaction to tetanus. Shingles vaccine couple years ago. Pap smear 2 years ago. Low risk.  Followed by rheumatologist and she has history of Sjogren's syndrome. She has Raynaud's syndrome which does flare during the winter. Blood pressure treated with amlodipine. Hypothyroidism on levothyroxin. History of esophageal stricture with no current symptomatology.  Past Medical History  Diagnosis Date  . Hypothyroidism   . Sinus tachycardia   . Cardiomegaly    Past Surgical History  Procedure Date  . Hand tendon surgery 02/09/2007    tendon transfer-Dr. Amanda Pea  . Ovarian cyst removal     Right Sided Salpingo-oopherectomy  . Nasal septum surgery     operated on by Dr. Haroldine Laws 20 years ago  . Salpingoophorectomy     reports that she has never smoked. She does not have any smokeless tobacco history on file. She reports that she does not drink alcohol or use illicit drugs. family history includes Alcohol abuse in her maternal grandfather and maternal grandmother; Allergies in her other; and Cancer in her father and mother. Allergies  Allergen Reactions  . Amoxicillin-Pot Clavulanate     REACTION: nausea  . Azithromycin     REACTION: nausea, Gi  . Celecoxib     REACTION: nausea  . Ciprofloxacin     REACTION: nausea  . Doxycycline     REACTION: gi upset  . Eggs Or Egg-Derived Products   . Erythromycin     REACTION: nause, GI  . Hydrocodone     REACTION: nausea  . Hydrocodone-Acetaminophen     REACTION: GI upset  . Levofloxacin     REACTION: she claims renal failiure  . Propoxyphene-Acetaminophen   . Rofecoxib     REACTION: nausea  . Tetanus Toxoid       Review of Systems  Constitutional: Negative for fever, activity  change, appetite change, fatigue and unexpected weight change.  HENT: Negative for hearing loss, ear pain, sore throat and trouble swallowing.   Eyes: Negative for visual disturbance.  Respiratory: Negative for cough and shortness of breath.   Cardiovascular: Negative for chest pain and palpitations.  Gastrointestinal: Negative for abdominal pain, diarrhea, constipation and blood in stool.  Genitourinary: Negative for dysuria and hematuria.  Musculoskeletal: Negative for myalgias, back pain and arthralgias.  Skin: Negative for rash.  Neurological: Negative for dizziness, syncope and headaches.  Hematological: Negative for adenopathy.  Psychiatric/Behavioral: Negative for confusion and dysphoric mood.       Objective:   Physical Exam  Constitutional: She is oriented to person, place, and time. She appears well-developed and well-nourished.  HENT:  Head: Normocephalic and atraumatic.  Eyes: EOM are normal. Pupils are equal, round, and reactive to light.  Neck: Normal range of motion. Neck supple. No thyromegaly present.  Cardiovascular: Normal rate, regular rhythm and normal heart sounds.   No murmur heard. Pulmonary/Chest: Breath sounds normal. No respiratory distress. She has no wheezes. She has no rales.  Abdominal: Soft. Bowel sounds are normal. She exhibits no distension and no mass. There is no tenderness. There is no rebound and no guarding.  Musculoskeletal: Normal range of motion. She exhibits no edema.  Lymphadenopathy:    She has no cervical adenopathy.  Neurological: She is alert and oriented to person, place,  and time. She displays normal reflexes. No cranial nerve deficit.  Skin: No rash noted.  Psychiatric: She has a normal mood and affect. Her behavior is normal. Judgment and thought content normal.          Assessment & Plan:  Complete physical. Screening labs obtained. Vaccines up-to-date with exception of tetanus which she cannot take. Schedule repeat  mammography, will need repeat Pap smear done next year

## 2012-01-23 NOTE — Progress Notes (Signed)
Quick Note:  Pt informed on personally identified VM ______ 

## 2012-01-26 LAB — PROTEIN ELECTROPHORESIS, SERUM
Beta Globulin: 5.9 % (ref 4.7–7.2)
Total Protein, Serum Electrophoresis: 7.1 g/dL (ref 6.0–8.3)

## 2012-01-27 NOTE — Progress Notes (Signed)
Quick Note:  In our research, Rita Ohara ordered, Dr Caryl Never authorizing. Pryor Curia states she never orders additional labs with out asking first. ______

## 2012-02-06 ENCOUNTER — Telehealth: Payer: Self-pay | Admitting: Family Medicine

## 2012-02-06 NOTE — Telephone Encounter (Signed)
Error

## 2012-03-10 ENCOUNTER — Encounter: Payer: Self-pay | Admitting: Cardiology

## 2012-03-17 ENCOUNTER — Encounter: Payer: Self-pay | Admitting: Family

## 2012-03-17 ENCOUNTER — Ambulatory Visit (INDEPENDENT_AMBULATORY_CARE_PROVIDER_SITE_OTHER): Payer: BC Managed Care – PPO | Admitting: Family

## 2012-03-17 VITALS — BP 106/76 | HR 102 | Temp 98.7°F | Wt 156.0 lb

## 2012-03-17 DIAGNOSIS — J069 Acute upper respiratory infection, unspecified: Secondary | ICD-10-CM

## 2012-03-17 DIAGNOSIS — L0390 Cellulitis, unspecified: Secondary | ICD-10-CM

## 2012-03-17 DIAGNOSIS — L0291 Cutaneous abscess, unspecified: Secondary | ICD-10-CM

## 2012-03-17 MED ORDER — CLINDAMYCIN HCL 150 MG PO CAPS: 150.0000 mg | ORAL_CAPSULE | Freq: Three times a day (TID) | ORAL | Status: DC

## 2012-03-17 NOTE — Progress Notes (Signed)
Subjective:    Patient ID: Christy Moore, female    DOB: 1945-12-23, 67 y.o.   MRN: [id number]  HPI 67 year old WF, nonsmoker, is in with c/o laryngitis, ear pain, nasal congestion x 3 days. She has taken Claritin-D that helps but has only taken 1 dose.   Patient has a history of cellulitis of her left lower extremity that originally began in August 2013. She subsequently had another episode in September 2013. She was treated with clindamycin due to her multiple allergies and tolerated the medication reasonably well. Today, she woke up with redness and pain to her left shin area. He has become increasingly warm to touch, more red, and sensitive.   Review of Systems  Constitutional: Negative.  Negative for chills and fatigue.  HENT: Positive for congestion, rhinorrhea and postnasal drip.   Eyes: Negative.   Respiratory: Negative for cough and wheezing.   Cardiovascular: Negative.   Skin: Positive for rash.       Cellulitis of the left lower leg  Allergic/Immunologic: Positive for environmental allergies.  Neurological: Negative.   Hematological: Negative.   Psychiatric/Behavioral: Negative.    Past Medical History  Diagnosis Date  . Hypothyroidism   . Sinus tachycardia   . Cardiomegaly     History   Social History  . Marital Status: Married    Spouse Name: N/A    Number of Children: N/A  . Years of Education: N/A   Occupational History  . Not on file.   Social History Main Topics  . Smoking status: Never Smoker   . Smokeless tobacco: Not on file  . Alcohol Use: No  . Drug Use: No  . Sexually Active:    Other Topics Concern  . Not on file   Social History Narrative  . No narrative on file    Past Surgical History  Procedure Laterality Date  . Hand tendon surgery  02/09/2007    tendon transfer-Dr. Amanda Pea  . Ovarian cyst removal      Right Sided Salpingo-oopherectomy  . Nasal septum surgery      operated on by Dr. Haroldine Laws 20 years ago  .  Salpingoophorectomy      Family History  Problem Relation Age of Onset  . Allergies Other     Grandmother-pt states she takes after grandmother who had multiple allergies  . Cancer Mother     breast and lung  . Cancer Father     prostate  . Alcohol abuse Maternal Grandmother   . Alcohol abuse Maternal Grandfather     Allergies  Allergen Reactions  . Amoxicillin-Pot Clavulanate     REACTION: nausea  . Azithromycin     REACTION: nausea, Gi  . Celecoxib     REACTION: nausea  . Ciprofloxacin     REACTION: nausea  . Doxycycline     REACTION: gi upset  . Eggs Or Egg-Derived Products   . Erythromycin     REACTION: nause, GI  . Hydrocodone     REACTION: nausea  . Hydrocodone-Acetaminophen     REACTION: GI upset  . Levofloxacin     REACTION: she claims renal failiure  . Propoxyphene-Acetaminophen   . Rofecoxib     REACTION: nausea  . Tetanus Toxoid     Current Outpatient Prescriptions on File Prior to Visit  Medication Sig Dispense Refill  . amLODipine (NORVASC) 2.5 MG tablet Take 1 tablet (2.5 mg total) by mouth daily.  90 tablet  3  . Diclofenac Potassium (CAMBIA) 50 MG  PACK Take 50 mg by mouth as needed. Powder pack used as needed for migraine headache      . levothyroxine (SYNTHROID, LEVOTHROID) 100 MCG tablet Take 1 tablet (100 mcg total) by mouth daily.  90 tablet  3  . pantoprazole (PROTONIX) 40 MG tablet Take 1 tablet (40 mg total) by mouth daily.  90 tablet  3  . [DISCONTINUED] metoprolol succinate (TOPROL-XL) 25 MG 24 hr tablet 1/2 tablet twice a day  90 tablet  0  . [DISCONTINUED] mupirocin (BACTROBAN) 2 % nasal ointment by Nasal route 2 (two) times daily. Use one-half of tube in each nostril twice daily for five (5) days. After application, press sides of nose together and gently massage.       . [DISCONTINUED] topiramate (TOPAMAX) 25 MG tablet Take 25 mg by mouth at bedtime.         No current facility-administered medications on file prior to visit.    BP  106/76  Pulse 102  Temp(Src) 98.7 F (37.1 C) (Oral)  Wt 156 lb (70.761 kg)  BMI 26.57 kg/m2  SpO2 97%chart    Objective:   Physical Exam  Constitutional: She is oriented to person, place, and time. She appears well-developed.  HENT:  Right Ear: External ear normal.  Left Ear: External ear normal.  Nose: Nose normal.  Mouth/Throat: Oropharynx is clear and moist.  Neck: Normal range of motion. Neck supple. No thyromegaly present.  Cardiovascular: Normal rate, regular rhythm and normal heart sounds.   Pulmonary/Chest: Effort normal and breath sounds normal.  Neurological: She is alert and oriented to person, place, and time.  Skin: Skin is warm and dry. There is erythema.  Left anterior shin red, tender to touch. No calf tenderness. Pedal pulses 2 out of 2.  Psychiatric: She has a normal mood and affect.          Assessment & Plan:  Assessment:  1. Allergic rhinitis 2. Cellulitis  Plan: 1. Continue Claritin-D twice a day. 2. Clindamycin 150 mg 3 times a day x7 days. Encouraged a probiotic or yogurt daily while she's on the medication. Consider referral to infectious disease if the cellulitis continues to recur. Call the office if symptoms worsen or persist. Recheck as scheduled, and as needed.

## 2012-03-17 NOTE — Patient Instructions (Addendum)
Upper Respiratory Infection, Adult An upper respiratory infection (URI) is also sometimes known as the common cold. The upper respiratory tract includes the nose, sinuses, throat, trachea, and bronchi. Bronchi are the airways leading to the lungs. Most people improve within 1 week, but symptoms can last up to 2 weeks. A residual cough may last even longer.  CAUSES Many different viruses can infect the tissues lining the upper respiratory tract. The tissues become irritated and inflamed and often become very moist. Mucus production is also common. A cold is contagious. You can easily spread the virus to others by oral contact. This includes kissing, sharing a glass, coughing, or sneezing. Touching your mouth or nose and then touching a surface, which is then touched by another person, can also spread the virus. SYMPTOMS  Symptoms typically develop 1 to 3 days after you come in contact with a cold virus. Symptoms vary from person to person. They may include:  Runny nose.  Sneezing.  Nasal congestion.  Sinus irritation.  Sore throat.  Loss of voice (laryngitis).  Cough.  Fatigue.  Muscle aches.  Loss of appetite.  Headache.  Low-grade fever. DIAGNOSIS  You might diagnose your own cold based on familiar symptoms, since most people get a cold 2 to 3 times a year. Your caregiver can confirm this based on your exam. Most importantly, your caregiver can check that your symptoms are not due to another disease such as strep throat, sinusitis, pneumonia, asthma, or epiglottitis. Blood tests, throat tests, and X-rays are not necessary to diagnose a common cold, but they may sometimes be helpful in excluding other more serious diseases. Your caregiver will decide if any further tests are required. RISKS AND COMPLICATIONS  You may be at risk for a more severe case of the common cold if you smoke cigarettes, have chronic heart disease (such as heart failure) or lung disease (such as asthma), or if  you have a weakened immune system. The very young and very old are also at risk for more serious infections. Bacterial sinusitis, middle ear infections, and bacterial pneumonia can complicate the common cold. The common cold can worsen asthma and chronic obstructive pulmonary disease (COPD). Sometimes, these complications can require emergency medical care and may be life-threatening. PREVENTION  The best way to protect against getting a cold is to practice good hygiene. Avoid oral or hand contact with people with cold symptoms. Wash your hands often if contact occurs. There is no clear evidence that vitamin C, vitamin E, echinacea, or exercise reduces the chance of developing a cold. However, it is always recommended to get plenty of rest and practice good nutrition. TREATMENT  Treatment is directed at relieving symptoms. There is no cure. Antibiotics are not effective, because the infection is caused by a virus, not by bacteria. Treatment may include:  Increased fluid intake. Sports drinks offer valuable electrolytes, sugars, and fluids.  Breathing heated mist or steam (vaporizer or shower).  Eating chicken soup or other clear broths, and maintaining good nutrition.  Getting plenty of rest.  Using gargles or lozenges for comfort.  Controlling fevers with ibuprofen or acetaminophen as directed by your caregiver.  Increasing usage of your inhaler if you have asthma. Zinc gel and zinc lozenges, taken in the first 24 hours of the common cold, can shorten the duration and lessen the severity of symptoms. Pain medicines may help with fever, muscle aches, and throat pain. A variety of non-prescription medicines are available to treat congestion and runny nose. Your caregiver   can make recommendations and may suggest nasal or lung inhalers for other symptoms.  HOME CARE INSTRUCTIONS   Only take over-the-counter or prescription medicines for pain, discomfort, or fever as directed by your  caregiver.  Use a warm mist humidifier or inhale steam from a shower to increase air moisture. This may keep secretions moist and make it easier to breathe.  Drink enough water and fluids to keep your urine clear or pale yellow.  Rest as needed.  Return to work when your temperature has returned to normal or as your caregiver advises. You may need to stay home longer to avoid infecting others. You can also use a face mask and careful hand washing to prevent spread of the virus. SEEK MEDICAL CARE IF:   After the first few days, you feel you are getting worse rather than better.  You need your caregiver's advice about medicines to control symptoms.  You develop chills, worsening shortness of breath, or brown or red sputum. These may be signs of pneumonia.  You develop yellow or brown nasal discharge or pain in the face, especially when you bend forward. These may be signs of sinusitis.  You develop a fever, swollen neck glands, pain with swallowing, or white areas in the back of your throat. These may be signs of strep throat. SEEK IMMEDIATE MEDICAL CARE IF:   You have a fever.  You develop severe or persistent headache, ear pain, sinus pain, or chest pain.  You develop wheezing, a prolonged cough, cough up blood, or have a change in your usual mucus (if you have chronic lung disease).  You develop sore muscles or a stiff neck. Document Released: 06/18/2000 Document Revised: 03/17/2011 Document Reviewed: 04/26/2010 [facility] Patient Information 2013 [place], [place].   Cellulitis Cellulitis is an infection of the skin and the tissue beneath it. The infected area is usually red and tender. Cellulitis occurs most often in the arms and lower legs.  CAUSES  Cellulitis is caused by bacteria that enter the skin through cracks or cuts in the skin. The most common types of bacteria that cause cellulitis are Staphylococcus and Streptococcus. SYMPTOMS   Redness and  warmth.  Swelling.  Tenderness or pain.  Fever. DIAGNOSIS  Your caregiver can usually determine what is wrong based on a physical exam. Blood tests may also be done. TREATMENT  Treatment usually involves taking an antibiotic medicine. HOME CARE INSTRUCTIONS   Take your antibiotics as directed. Finish them even if you start to feel better.  Keep the infected arm or leg elevated to reduce swelling.  Apply a warm cloth to the affected area up to 4 times per day to relieve pain.  Only take over-the-counter or prescription medicines for pain, discomfort, or fever as directed by your caregiver.  Keep all follow-up appointments as directed by your caregiver. SEEK MEDICAL CARE IF:   You notice red streaks coming from the infected area.  Your red area gets larger or turns dark in color.  Your bone or joint underneath the infected area becomes painful after the skin has healed.  Your infection returns in the same area or another area.  You notice a swollen bump in the infected area.  You develop new symptoms. SEEK IMMEDIATE MEDICAL CARE IF:   You have a fever.  You feel very sleepy.  You develop vomiting or diarrhea.  You have a general ill feeling (malaise) with muscle aches and pains. MAKE SURE YOU:   Understand these instructions.  Will watch your condition.  Will get help right away if you are not doing well or get worse. Document Released: 10/02/2004 Document Revised: 06/24/2011 Document Reviewed: 03/10/2011 [facility] Patient Information 2013 [place], [place].

## 2012-04-07 ENCOUNTER — Other Ambulatory Visit: Payer: Self-pay | Admitting: Family Medicine

## 2012-05-04 ENCOUNTER — Encounter: Payer: Self-pay | Admitting: Cardiology

## 2012-08-05 ENCOUNTER — Ambulatory Visit (INDEPENDENT_AMBULATORY_CARE_PROVIDER_SITE_OTHER): Payer: BC Managed Care – PPO | Admitting: Family Medicine

## 2012-08-05 ENCOUNTER — Ambulatory Visit: Payer: BC Managed Care – PPO

## 2012-08-05 VITALS — BP 122/78 | HR 96 | Temp 100.1°F | Resp 20 | Ht 64.0 in | Wt 159.0 lb

## 2012-08-05 DIAGNOSIS — R0602 Shortness of breath: Secondary | ICD-10-CM

## 2012-08-05 DIAGNOSIS — J069 Acute upper respiratory infection, unspecified: Secondary | ICD-10-CM

## 2012-08-05 LAB — POCT CBC
Granulocyte percent: 79.1 %G (ref 37–80)
HCT, POC: 42.3 % (ref 37.7–47.9)
Hemoglobin: 13.6 g/dL (ref 12.2–16.2)
Lymph, poc: 1.5 (ref 0.6–3.4)
MCH, POC: 30.7 pg (ref 27–31.2)
MCHC: 32.2 g/dL (ref 31.8–35.4)
MCV: 95.4 fL (ref 80–97)
MID (cbc): 0.6 (ref 0–0.9)
MPV: 14.4 fL (ref 0–99.8)
POC Granulocyte: 7.8 — AB (ref 2–6.9)
POC LYMPH PERCENT: 15.1 %L (ref 10–50)
POC MID %: 5.8 % (ref 0–12)
Platelet Count, POC: 189 10*3/uL (ref 142–424)
RBC: 4.43 M/uL (ref 4.04–5.48)
RDW, POC: 14.3 %
WBC: 9.8 10*3/uL (ref 4.6–10.2)

## 2012-08-05 LAB — D-DIMER, QUANTITATIVE: D-Dimer, Quant: 0.27 ug{FEU}/mL (ref 0.00–0.48)

## 2012-08-05 MED ORDER — CLINDAMYCIN HCL 300 MG PO CAPS: 300.0000 mg | ORAL_CAPSULE | Freq: Three times a day (TID) | ORAL | Status: DC

## 2012-08-05 NOTE — Progress Notes (Signed)
[facility]:  Office Visit  Chief Complaint:  Chief Complaint  Patient presents with  . Shortness of Breath    today-very painful to take a breath    HPI: Christy Moore is a 67 y.o. female who complains of a 1 day history of acute onset of SOB this morning. Yesterday she had some sneezing and coughing but it went away, lasted for about 30 minutes.  She has low probability of PE: No recent plane ride, car ride No recent surgeries, no h/o malignancy, no history of DVT/PE Nonsmoker No use of estrogen No leg pain, swelling or asymmetry She went to work and had stabbing back pain and was painful with deep breaths.  She now has a low grade fever No new acitivities, no heavy lifting or chest wall injury  Past Medical History  Diagnosis Date  . Hypothyroidism   . Sinus tachycardia   . Cardiomegaly    Past Surgical History  Procedure Laterality Date  . Hand tendon surgery  02/09/2007    tendon transfer-Dr. Amanda Pea  . Ovarian cyst removal      Right Sided Salpingo-oopherectomy  . Nasal septum surgery      operated on by Dr. Haroldine Laws 20 years ago  . Salpingoophorectomy     History   Social History  . Marital Status: Married    Spouse Name: N/A    Number of Children: N/A  . Years of Education: N/A   Social History Main Topics  . Smoking status: Never Smoker   . Smokeless tobacco: None  . Alcohol Use: No  . Drug Use: No  . Sexually Active:    Other Topics Concern  . None   Social History Narrative  . None   Family History  Problem Relation Age of Onset  . Allergies Other     Grandmother-pt states she takes after grandmother who had multiple allergies  . Cancer Mother     breast and lung  . Cancer Father     prostate  . Alcohol abuse Maternal Grandmother   . Alcohol abuse Maternal Grandfather    Allergies  Allergen Reactions  . Amoxicillin-Pot Clavulanate     REACTION: nausea  . Azithromycin     REACTION: nausea, Gi  . Celecoxib    REACTION: nausea  . Ciprofloxacin     REACTION: nausea  . Doxycycline     REACTION: gi upset  . Eggs Or Egg-Derived Products   . Erythromycin     REACTION: nause, GI  . Hydrocodone     REACTION: nausea  . Hydrocodone-Acetaminophen     REACTION: GI upset  . Levofloxacin     REACTION: she claims renal failiure  . Propoxyphene-Acetaminophen   . Rofecoxib     REACTION: nausea  . Tetanus Toxoid    Prior to Admission medications   Medication Sig Start Date End Date Taking? Authorizing Provider  amLODipine (NORVASC) 2.5 MG tablet Take 1 tablet (2.5 mg total) by mouth daily. 11/25/11 11/24/12 Yes Bruce Colletta [place], MD  levothyroxine (SYNTHROID, LEVOTHROID) 100 MCG tablet TAKE 1 TABLET (100 MCG TOTAL) BY MOUTH DAILY. 04/07/12  Yes Kristian Covey, MD  pantoprazole (PROTONIX) 40 MG tablet Take 1 tablet (40 mg total) by mouth daily. 09/11/11  Yes Kristian Covey, MD     ROS: The patient denies  night sweats, unintentional weight loss,palpitations, wheezing, nausea, vomiting, abdominal pain, dysuria, hematuria, melena, numbness, weakness, or tingling.   All other systems have been reviewed and were  otherwise negative with the exception of those mentioned in the HPI and as above.    PHYSICAL EXAM: Filed Vitals:   08/05/12 1923  BP: 122/78  Pulse: 96  Temp: 100.1 F (37.8 C)  Resp: 20   Filed Vitals:   08/05/12 1923  Height: 5\' 4"  (1.626 m)  Weight: 159 lb (72.122 kg)   Body mass index is 27.28 kg/(m^2).  General: Alert, no acute distress HEENT:  Normocephalic, atraumatic, oropharynx patent. Tm nl. No exudates, throat is normal. No sinus tenderness Cardiovascular:  Regular rate and rhythm, no rubs murmurs or gallops.  No Carotid bruits, radial pulse intact. No pedal edema.  Respiratory: Clear to auscultation bilaterally.  No wheezes, rales, or rhonchi.  No cyanosis, no use of accessory musculature GI: No organomegaly, abdomen is soft and non-tender, positive bowel sounds.  No  masses. Skin: No rashes. Neurologic: Facial musculature symmetric. Psychiatric: Patient is appropriate throughout our interaction. Lymphatic: No cervical lymphadenopathy Musculoskeletal: Gait intact.   LABS: Results for orders placed in visit on 08/05/12  D-DIMER, QUANTITATIVE      Result Value Range   D-Dimer, Quant 0.27  0.00 - 0.48 ug/mL-FEU  POCT CBC      Result Value Range   WBC 9.8  4.6 - 10.2 K/uL   Lymph, poc 1.5  0.6 - 3.4   POC LYMPH PERCENT 15.1  10 - 50 %L   MID (cbc) 0.6  0 - 0.9   POC MID % 5.8  0 - 12 %M   POC Granulocyte 7.8 (*) 2 - 6.9   Granulocyte percent 79.1  37 - 80 %G   RBC 4.43  4.04 - 5.48 M/uL   Hemoglobin 13.6  12.2 - 16.2 g/dL   HCT, POC 40.9  81.1 - 47.9 %   MCV 95.4  80 - 97 fL   MCH, POC 30.7  27 - 31.2 pg   MCHC 32.2  31.8 - 35.4 g/dL   RDW, POC 91.4     Platelet Count, POC 189  142 - 424 K/uL   MPV 14.4  0 - 99.8 fL     EKG/XRAY:   Primary read interpreted by Dr. Conley Rolls at [facility]. ? Right peri- hilar Infiltrate vs increase vascular markings + horizontal fissure line No appreciable pneumothorax, no cardiomegaly Pt unable to take deep breaths  ASSESSMENT/PLAN: Encounter Diagnoses  Name Primary?  . SOB (shortness of breath) Yes  . URI (upper respiratory infection)    She has multiple drug allergies especially to abx including sulfa drugs She can try low dose clindamycin for short course less than 1 week without problems ? Bronchitis vs viral URI with pleurisy. No new activities, No e/o costochondritis Precautions for PE given to patient Stat D dimer will be obtained [phone] ( home)  Go to ER prn or f/u prn Official radiology results pending   Spoke to patient about labs --CBC and D dimer.   [name] PHUONG, DO 08/05/2012 10:12 PM   08/06/12 9:14 pm  Patient is doing slightly better able to take deeper breaths but still hurts We discussed her radiology report and she will continue with the clindamycin, motrin prn due to clinical  picture.  F/u prn or go to ER prn

## 2012-09-07 ENCOUNTER — Ambulatory Visit (INDEPENDENT_AMBULATORY_CARE_PROVIDER_SITE_OTHER): Payer: BC Managed Care – PPO | Admitting: Physician Assistant

## 2012-09-07 VITALS — BP 124/74 | HR 89 | Temp 99.1°F | Resp 16 | Ht 64.0 in | Wt 157.0 lb

## 2012-09-07 DIAGNOSIS — L03116 Cellulitis of left lower limb: Secondary | ICD-10-CM

## 2012-09-07 DIAGNOSIS — L02419 Cutaneous abscess of limb, unspecified: Secondary | ICD-10-CM

## 2012-09-07 MED ORDER — CLINDAMYCIN HCL 150 MG PO CAPS: 150.0000 mg | ORAL_CAPSULE | Freq: Three times a day (TID) | ORAL | Status: DC

## 2012-09-07 NOTE — Patient Instructions (Addendum)
Begin taking the clindamycin as directed (150mg  every 8 hours for 7 days).  Make sure to finish the full course.  Continue Motrin and/or Tylenol for pain relief.  Elevate the leg tonight.  Please let us know if any symptoms are worsening or not improving or if the redness is spreading.  I have put in a referral to infectious disease - you will get a phone call about scheduling this   Cellulitis Cellulitis is an infection of the skin and the tissue beneath it. The infected area is usually red and tender. Cellulitis occurs most often in the arms and lower legs.  CAUSES  Cellulitis is caused by bacteria that enter the skin through cracks or cuts in the skin. The most common types of bacteria that cause cellulitis are Staphylococcus and Streptococcus. SYMPTOMS   Redness and warmth.  Swelling.  Tenderness or pain.  Fever. DIAGNOSIS  Your caregiver can usually determine what is wrong based on a physical exam. Blood tests may also be done. TREATMENT  Treatment usually involves taking an antibiotic medicine. HOME CARE INSTRUCTIONS   Take your antibiotics as directed. Finish them even if you start to feel better.  Keep the infected arm or leg elevated to reduce swelling.  Apply a warm cloth to the affected area up to 4 times per day to relieve pain.  Only take over-the-counter or prescription medicines for pain, discomfort, or fever as directed by your caregiver.  Keep all follow-up appointments as directed by your caregiver. SEEK MEDICAL CARE IF:   You notice red streaks coming from the infected area.  Your red area gets larger or turns dark in color.  Your bone or joint underneath the infected area becomes painful after the skin has healed.  Your infection returns in the same area or another area.  You notice a swollen bump in the infected area.  You develop new symptoms. SEEK IMMEDIATE MEDICAL CARE IF:   You have a fever.  You feel very sleepy.  You develop vomiting or  diarrhea.  You have a general ill feeling (malaise) with muscle aches and pains. MAKE SURE YOU:   Understand these instructions.  Will watch your condition.  Will get help right away if you are not doing well or get worse. Document Released: 10/02/2004 Document Revised: 06/24/2011 Document Reviewed: 03/10/2011 [facility] Patient Information 2014 [place], [place].

## 2012-09-07 NOTE — Progress Notes (Signed)
  Subjective:    Patient ID: Christy Moore, female    DOB: Jan 27, 1945, 67 y.o.   MRN: [id number]  HPI   Christy Moore is a pleasant 67 yr old female here with concern for cellulitis of her left lower leg.  This has been a recurring problem - last in March 2014.  Always occurs in the same location.  She had been working in the yard a few days ago and assumed she had bruised her skin; however the left shin then turned pink and became warm and painful.  Describes the pain as throbbing.  No pain into toes or calf.  No edema.  No hx of blood clots.  Pt has taken Motrin for pain relief and elevated the leg today.    She had some clindamycin left over from previous episode of cellulitis she took 300mg  at 6pm last night then 4 hours later took another 150mg .  Today she took 300mg  at 5am and 300mg  at 2:30pm  She reports extensive antibiotic intolerances - mostly nausea, no hypersensitivity   Review of Systems  Constitutional: Negative for fever and chills.  HENT: Negative.   Respiratory: Negative.   Cardiovascular: Negative.   Gastrointestinal: Negative.   Skin: Positive for color change.  Neurological: Negative.        Objective:   Physical Exam  Vitals reviewed. Constitutional: She is oriented to person, place, and time. She appears well-developed and well-nourished. No distress.  HENT:  Head: Normocephalic and atraumatic.  Eyes: Conjunctivae are normal. No scleral icterus.  Cardiovascular: Normal rate, regular rhythm and normal heart sounds.   Pulmonary/Chest: Effort normal and breath sounds normal. She has no wheezes. She has no rales.  Neurological: She is alert and oriented to person, place, and time.  Skin: Skin is warm and dry. There is erythema.     Approx 5cm of slight erythema at anterior aspect of left lower lef; the area is warm to the touch; no induration or fluctuance; no open areas; the leg is not edematous  Psychiatric: She has a normal mood and affect. Her behavior is normal.         Assessment & Plan:  Cellulitis of leg, left - Plan: clindamycin (CLEOCIN) 150 MG capsule, Ambulatory referral to Infectious Disease   Christy Moore is a 67 yr old female with cellulitis of left lower leg.  This has been a recurrent problem in the same location.  Pt has significant antibiotic intolerances, most of which are GI related and not true hypersensitivities, but in any case pt has tolerate clinda in the past and it has resolved cellulitis.  Will try clinda 150mg  TID x 7 days.  Continue Motrin/Tylenol for pain relief.  Elevate the leg as much as possible.  Will refer pt to ID for evaluation given recurrent cellulitis and extensive intolerances.  Pt to RTC if worsening or not improving.

## 2012-09-16 ENCOUNTER — Other Ambulatory Visit: Payer: Self-pay | Admitting: Family Medicine

## 2012-09-17 ENCOUNTER — Encounter: Payer: Self-pay | Admitting: Internal Medicine

## 2012-09-17 ENCOUNTER — Ambulatory Visit (INDEPENDENT_AMBULATORY_CARE_PROVIDER_SITE_OTHER): Payer: BC Managed Care – PPO | Admitting: Internal Medicine

## 2012-09-17 VITALS — Temp 98.1°F | Ht 64.0 in | Wt 157.0 lb

## 2012-09-17 DIAGNOSIS — L03119 Cellulitis of unspecified part of limb: Secondary | ICD-10-CM

## 2012-09-17 DIAGNOSIS — L02419 Cutaneous abscess of limb, unspecified: Secondary | ICD-10-CM

## 2012-09-17 NOTE — Progress Notes (Signed)
[facility] NOTE  RFV: community referral for recurent cellulitis of left leg  Subjective:    Patient ID: Christy Moore, female    DOB: 1945/01/10, 67 y.o.   MRN: [id number]  HPI 67yo F with sjorgen's syndrome/raynaud's phenomenon, has mostly dry eyes and dry mouth, not on immuno-modulators, she does suffer from recurrent left leg cellulitis. Initially treated in aug 2013, recurrent in sep 2013, Dec 2013. Had episode in March  Went to urgent care in early sept started to notice "starting again". First noted having feeling of having bruised leg, and second day noticeably having pain with ambulation with erythema to anterior shin and warm to touch. At urgent care they gave clindamycin 150mg  TID but did not show much improved, clinda 300mg  BID which improved her erythema. Now just feels "bruised" .   Allergies  Allergen Reactions  . Amoxicillin-Pot Clavulanate     REACTION: nausea  . Azithromycin     REACTION: nausea, Gi  . Celecoxib     REACTION: nausea  . Ciprofloxacin     REACTION: nausea  . Doxycycline     REACTION: gi upset  . Eggs Or Egg-Derived Products   . Erythromycin     REACTION: nause, GI  . Hydrocodone     REACTION: nausea  . Hydrocodone-Acetaminophen     REACTION: GI upset  . Levofloxacin     REACTION: she claims renal failiure  . Propoxyphene-Acetaminophen   . Rofecoxib     REACTION: nausea  . Tetanus Toxoid    Current Outpatient Prescriptions on File Prior to Visit  Medication Sig Dispense Refill  . amLODipine (NORVASC) 2.5 MG tablet Take 1 tablet (2.5 mg total) by mouth daily.  90 tablet  3  . clindamycin (CLEOCIN) 150 MG capsule Take 1 capsule (150 mg total) by mouth 3 (three) times daily.  21 capsule  0  . levothyroxine (SYNTHROID, LEVOTHROID) 100 MCG tablet TAKE 1 TABLET (100 MCG TOTAL) BY MOUTH DAILY.  90 tablet  3  . pantoprazole (PROTONIX) 40 MG tablet TAKE 1 TABLET (40 MG TOTAL) BY MOUTH DAILY.  90 tablet  3  . [DISCONTINUED] metoprolol succinate  (TOPROL-XL) 25 MG 24 hr tablet 1/2 tablet twice a day  90 tablet  0  . [DISCONTINUED] mupirocin (BACTROBAN) 2 % nasal ointment by Nasal route 2 (two) times daily. Use one-half of tube in each nostril twice daily for five (5) days. After application, press sides of nose together and gently massage.       . [DISCONTINUED] topiramate (TOPAMAX) 25 MG tablet Take 25 mg by mouth at bedtime.         No current facility-administered medications on file prior to visit.   Active Ambulatory Problems    Diagnosis Date Noted  . OTHER CHRONIC SINUSITIS 12/20/2007  . Rosacea 12/20/2007  . Cardiomegaly 02/26/2010  . Hypothyroidism 02/26/2010  . Wrist fracture, left 02/26/2010  . Liver hemangioma 02/26/2010  . Migraine 02/26/2010  . Esophageal stricture 02/26/2010  . Weight loss 02/26/2010  . GERD (gastroesophageal reflux disease) 03/19/2011  . Hypertension 03/19/2011  . Raynaud's disease 03/19/2011  . Sjogren's disease 01/22/2012   Resolved Ambulatory Problems    Diagnosis Date Noted  . No Resolved Ambulatory Problems   Past Medical History  Diagnosis Date  . Sinus tachycardia    History  Substance Use Topics  . Smoking status: Never Smoker   . Smokeless tobacco: Not on file  . Alcohol Use: No  family history includes Alcohol abuse in her  maternal grandfather and maternal grandmother; Allergies in her other; Cancer in her father and mother.   Review of Systems  Constitutional: Negative for fever, chills, diaphoresis, activity change, appetite change, fatigue and unexpected weight change.  HENT: dry mouth. Negative for congestion, sore throat, rhinorrhea, sneezing, trouble swallowing and sinus pressure.  Eyes: Negative for photophobia and visual disturbance. Does have dry eyes Respiratory: Negative for cough, chest tightness, shortness of breath, wheezing and stridor.  Cardiovascular: Negative for chest pain, palpitations and leg swelling.  Gastrointestinal: Negative for nausea, vomiting,  abdominal pain, diarrhea, constipation, blood in stool, abdominal distention and anal bleeding.  Genitourinary: Negative for dysuria, hematuria, flank pain and difficulty urinating. Painful sex due vaginal dryness Musculoskeletal: Negative for myalgias, back pain, joint swelling, arthralgias and gait problem.  Skin: Negative for color change, pallor, rash and wound.  Neurological: Negative for dizziness, tremors, weakness and light-headedness.  Hematological: Negative for adenopathy. Does not bruise/bleed easily.  Psychiatric/Behavioral: Negative for behavioral problems, confusion, sleep disturbance, dysphoric mood, decreased concentration and agitation.       Objective:   Physical Exam Temp(Src) 98.1 F (36.7 C) (Oral)  Ht 5\' 4"  (1.626 m)  Wt 157 lb (71.215 kg)  BMI 26.94 kg/m2 Physical Exam  Constitutional: oriented to person, place, and time.  appears well-developed and well-nourished. No distress.  HENT:  Mouth/Throat: Oropharynx is clear and moist. No oropharyngeal exudate.  Lymphadenopathy: no cervical adenopathy.  Neurological:  alert and oriented to person, place, and time.  Skin: Skin is warm and dry. No rash noted. No erythema.  Psychiatric: a normal mood and affect. His behavior is normal.       Assessment & Plan:  Recurrent cellulitis = no resolved with clindamycin 300mg  BID x 5 days. Recommend that in the future she should use clindamycin 300mg  Q 8hr for more effective dosing. Her early "non-response" to antibiotics was likely due to underdosing. For now do not recommend any chronic prophylaxis. Treat as needed for next episode. She is welcome to come back if not responding in 7 days of antibiotics  Numerous antibiotic intolerances = consider to pre dosing with anti-emetic if need to use other antibiotics

## 2012-11-01 ENCOUNTER — Ambulatory Visit (INDEPENDENT_AMBULATORY_CARE_PROVIDER_SITE_OTHER): Payer: BC Managed Care – PPO | Admitting: Internal Medicine

## 2012-11-01 VITALS — BP 120/88 | HR 88 | Temp 98.4°F | Resp 20 | Ht 64.0 in | Wt 156.4 lb

## 2012-11-01 DIAGNOSIS — B370 Candidal stomatitis: Secondary | ICD-10-CM

## 2012-11-01 DIAGNOSIS — J309 Allergic rhinitis, unspecified: Secondary | ICD-10-CM

## 2012-11-01 MED ORDER — FLUTICASONE PROPIONATE 50 MCG/ACT NA SUSP: 2.0000 | Freq: Every day | NASAL | Status: DC

## 2012-11-01 MED ORDER — NYSTATIN 100000 UNIT/ML MT SUSP: 500000.0000 [IU] | Freq: Four times a day (QID) | OROMUCOSAL | Status: DC

## 2012-11-01 NOTE — Progress Notes (Signed)
  Subjective:    Patient ID: Christy Moore, female    DOB: 1945-05-04, 67 y.o.   MRN: [id number]  HPI Patient with one week history of sore mouth. Started after eating a tootsie roll last week. Also having trouble with allergies, itchy eyes. Takes Benadryl at night and sometimes loratadine during the day. Has tried saline nasal spray and vicks vaporub. Clear nasal drainage, gets congested at night. Not much PND. Has dry mouth and eyes due to Sjogren's. Has tried Muconex and Flonase in past with some success.  No fever. Little dry cough.  NO SOB, no chest pain.   Had a course of cleocin about 1 month ago.  Had flu vaccine 10/14   Review of Systems  Constitutional: Negative for fever and fatigue.  HENT: Positive for drooling, ear pain, rhinorrhea and sore throat. Negative for postnasal drip and sinus pressure.        Ear fullness.Tongue sore, white coating.  Eyes: Positive for itching.  Respiratory: Positive for cough. Negative for chest tightness, shortness of breath and wheezing.   Cardiovascular: Negative for chest pain.  Allergic/Immunologic: Positive for environmental allergies.       Objective:   Physical Exam  Nursing note and vitals reviewed. Constitutional: She is oriented to person, place, and time. She appears well-developed and well-nourished. No distress.  HENT:  Right Ear: Tympanic membrane, external ear and ear canal normal.  Left Ear: Tympanic membrane, external ear and ear canal normal.  Nose: Mucosal edema and rhinorrhea present.  Mouth/Throat: Uvula is midline and mucous membranes are normal. Posterior oropharyngeal erythema present.  Tongue with white coating.  Neck: Normal range of motion. Neck supple.  Slightly tender right anterior neck. No masses palpated.  Cardiovascular: Normal rate, regular rhythm and normal heart sounds.   Pulmonary/Chest: Effort normal and breath sounds normal. No respiratory distress. She has no wheezes. She has no rales.   Lymphadenopathy:    She has no cervical adenopathy.  Neurological: She is alert and oriented to person, place, and time.  Skin: Skin is warm and dry. She is not diaphoretic.  Psychiatric: She has a normal mood and affect. Her behavior is normal. Judgment and thought content normal.      Assessment & Plan:  Thrush, oral  Allergic rhinitis  Meds ordered this encounter  Medications  . fluticasone (FLONASE) 50 MCG/ACT nasal spray    Sig: Place 2 sprays into the nose daily.    Dispense:  16 g    Refill:  6  . nystatin (MYCOSTATIN) 100000 UNIT/ML suspension    Sig: Take 5 mLs (500,000 Units total) by mouth 4 (four) times daily.    Dispense:  240 mL    Refill:  0   Instructed patient to return if no improvement with treatment or if worsening, or fever, cough, SOB.  I have completed the patient encounter in its entirety as documented by FNP Leone Payor, with editing by me where necessary. Robert P. Merla Riches, M.D.

## 2012-11-03 ENCOUNTER — Encounter: Payer: Self-pay | Admitting: Family Medicine

## 2012-11-03 ENCOUNTER — Encounter: Payer: Self-pay | Admitting: *Deleted

## 2012-11-03 ENCOUNTER — Ambulatory Visit (INDEPENDENT_AMBULATORY_CARE_PROVIDER_SITE_OTHER): Payer: Medicare Other | Admitting: Family Medicine

## 2012-11-03 ENCOUNTER — Telehealth: Payer: Self-pay | Admitting: Family Medicine

## 2012-11-03 VITALS — BP 132/80 | HR 84 | Temp 99.8°F

## 2012-11-03 DIAGNOSIS — B370 Candidal stomatitis: Secondary | ICD-10-CM | POA: Diagnosis not present

## 2012-11-03 MED ORDER — FLUCONAZOLE 150 MG PO TABS: 150.0000 mg | ORAL_TABLET | Freq: Every day | ORAL | Status: DC

## 2012-11-03 NOTE — Progress Notes (Signed)
Subjective: Patient is a 67 year old female with history of Sjgren syndrome as well as recent cellulitis treated with antibiotics coming in with 2 week history of painful mouth sores. Patient was seen in urgent care 2 days ago and was diagnosed with rash. Patient was given a nystatin swish and swallow and states this has not made any significant improvement.  Past medical history, social, surgical and family history all reviewed.   Denies fever, chills, nausea vomiting abdominal pain, dysuria, chest pain, shortness of breath dyspnea on exertion or numbness in extremities   Physical exam BP 132/80  Pulse 84  Temp(Src) 99.8 F (37.7 C) (Oral)  SpO2 97% General appearance: alert and cooperative Head: Normocephalic, without obvious abnormality, atraumatic Eyes: conjunctivae/corneas clear. PERRL, EOM's intact. Fundi benign. Ears: normal TM's and external ear canals both ears Nose: Nares normal. Septum midline. Mucosa normal. No drainage or sinus tenderness.Mild redness of turbinates.  Throat: abnormal findings: fissured tongue, geographic tongue, mild oropharyngeal erythema, thrush and small vesicles on posterior pharynx.  Neck: moderate anterior cervical adenopathy, no carotid bruit, no JVD, supple, symmetrical, trachea midline and thyroid not enlarged, symmetric, no tenderness/mass/nodules Lungs: clear to auscultation bilaterally Heart: regular rate and rhythm, S1, S2 normal, no murmur, click, rub or gallop Abdomen: soft, non-tender; bowel sounds normal; no masses,  no organomegaly Extremities: extremities normal, atraumatic, no cyanosis or edema Skin: Skin color, texture, turgor normal. No rashes or lesions Lymph nodes: Moderate anterior cervical lymphadenopathy Neurologic: Alert and oriented X 3, normal strength and tone. Normal symmetric reflexes. Normal coordination and gait

## 2012-11-03 NOTE — Telephone Encounter (Signed)
Patient Information:  Caller Name: [place]  Phone: [phone]  Patient: Christy Moore, Christy Moore  Gender: Female  DOB: Dec 27, 1945  Age: 67 Years  PCP: Evelena Peat (Family Practice)  Office Follow Up:  Does the office need to follow up with this patient?: No  Instructions For The Office: N/A  RN Note:  Drinking fluids and voiding.  Eating soft foods. Marble sized flat lymph nodes sore on both sides. Minimal rhinorrhea present. No appointments remain at [facility]; scheduled for 1300 11/03/12 with Dr. Katrinka Blazing at [place] office.    Symptoms  Reason For Call & Symptoms: Mouth discomfort, white tongue, sensation of lump in throat "like a golf ball,"  aching glands in neck without swollen lymph nodes, aching in shoulders "like someone took a baseball bat to me."  Reports '"general feeling of exhaustion."  Diagnosed with oral thrush 11/01/12 at [facility].  UC stopped antihistamine and started  Flonase, Mucinex, and Magic Mouthwash.  Reviewed Health History In EMR: Yes  Reviewed Medications In EMR: Yes  Reviewed Allergies In EMR: Yes  Reviewed Surgeries / Procedures: Yes  Date of Onset of Symptoms: 10/27/2012  Treatments Tried: Magic Mouthwash, Flonase, Mucinex  Treatments Tried Worked: No  Guideline(s) Used:  Neck Pain or Stiffness  Lymph Nodes - Swollen  Disposition Per Guideline:   See Today in Office  Reason For Disposition Reached:   Tender node in the neck and also has a sore throat with minimal/no runny nose or cough  Advice Given:  Swollen Lymph Nodes from a Viral Infection:  Viral throat infections and colds can cause lymph nodes in the neck to double in size. Slight enlargement and mild tenderness means the lymph node is fighting the infection and doing a good job.  Expected Course: After the infection is gone, the nodes slowly return to normal size over 1 to 2 weeks. However, they will not ever completely disappear.  Pain and Fever Medicines:  For pain or fever relief, take  either acetaminophen or ibuprofen.  Call Back If:  Node enlarges to more than 1 inch (2.5 cm) in size  Overlying skin becomes red  Fever more than 103 F (39.4 C) occurs  You become worse or are worried about a lymph node.  Patient Will Follow Care Advice:  YES

## 2012-11-03 NOTE — Assessment & Plan Note (Addendum)
With patient's recent antibiotic use and patient seemed to not be responding quickly to nystatin and her history of Sorgren syndrome I believe oral anti-fungals are necessary. Patient was given a prescription for ten-day supply of Diflucan Follow up with PCP in 10 days. COntinue flonase, avoid antihistamines secondary to SS.

## 2012-11-03 NOTE — Telephone Encounter (Signed)
Noted, Pt was seen at [facility] office today

## 2012-11-03 NOTE — Patient Instructions (Signed)
Nice to meet you Try diflucan daily for 10 days Sugar free gum daily Honey at night to help throat Only come back if not better in 72 hours.  Thrush, Adult  Christy Moore is a yeast infection that develops in the mouth and throat and on the tongue. The medical term for this is oropharyngeal candidiasis, or OPC. Christy Moore is most common in older adults, but it can occur at any age. Christy Moore occurs when a yeast called candida grows out of control. Candida normally is present in small amounts in the mouth and on other mucous membranes. However, under certain circumstances, candida can grow rapidly, causing thrush. Christy Moore can be a recurring problem for people who have chronic illnesses or who take medications that limit the body's ability to fight infection (weakened immune system). Since these people have difficulty fighting infections, the fungus that causes thrush can spread throughout the body. This can cause life-threatening blood or organ infections. CAUSES  Candida, the yeast that causes thrush, is normally present in small amounts in the mouth and on other mucous membranes. It usually causes no harm. However, when conditions are present that allow the yeast to grow uncontrolled, it invades surrounding tissues and becomes an infection. Christy Moore is most commonly caused by the yeast Candida albicans. Less often, other forms of candida can lead to thrush. There are many types of bacteria in your mouth that normally control the growth of candida. Sometimes a new type of bacteria gets into your mouth and disrupts the balance of the germs already there. This can allow candida to overgrow. Other factors that increase your risk of developing thrush include:  An impaired ability to fight infection (weakened immune system). A normal immune system is usually strong enough to prevent candida from overgrowing.  Older adults are more likely to develop thrush because they may have weaker immune systems.  People with human  immunodeficiency virus (HIV) infection have a high likelihood of developing thrush. About 90% of people with HIV develop thrush at some point during the course of their disease.  People with diabetes are more likely to get thrush because high blood sugar levels promote overgrowth of the candida fungus.  A dry mouth (xerostomia). Dry mouth can result from overuse of mouthwashes or from certain conditions such as Sjgren's syndrome.  Pregnancy. Hormone changes during pregnancy can lead to thrush by altering the balance of bacteria in the mouth.  Poor dental care, especially in people who have false teeth.  The use of antibiotic medications. This may lead to thrush by changing the balance of bacteria in the mouth. SYMPTOMS  Thrush can be a mild infection that causes no symptoms. If symptoms develop, they may include the following:  A burning feeling in the mouth and throat. This can occur at the start of a thrush infection.  White patches that adhere to the mouth and tongue. The tissue around the patches may be red, raw, and painful. If rubbed (during tooth brushing, for example), the patches and the tissue of the mouth may bleed easily.  A bad taste in the mouth or difficulty tasting foods.  Cottony feeling in the mouth.  Sometimes pain during eating and swallowing. DIAGNOSIS  Your caregiver can usually diagnose thrush by exam. In addition to looking in your mouth, your caregiver will ask you questions about your health. TREATMENT  Medications that help prevent the growth of fungi (antifungals) are the standard treatment for thrush. These medications are either applied directly to the affected area (topical) or  swallowed (oral). Mild thrush In adults, mild cases of thrush may clear up with simple treatment that can be done at home. This treatment usually involves using an antifungal mouth rinse or lozenges. Treatment usually lasts about 14 days. Moderate to severe thrush  More severe  thrush infections that have spread to the esophagus are treated with an oral antifungal medication. A topical antifungal medication may also be used.  For some severe infections, a treatment period longer than 14 days may be needed.  Oral antifungal medications are almost never used during pregnancy because the fetus may be harmed. However, if a pregnant woman has a rare, severe thrush infection that has spread to her blood, oral antifungal medications may be used. In this case, the risk of harm to the mother and fetus from the severe thrush infection may be greater than the risk posed by the use of antifungal medications. Persistent or recurrent thrush Persistent (does not go away) or recurrent (keeps coming back) cases of thrush may:  Need to be treated twice as long as the symptoms last.  Require treatment with both oral and topical antifungal medications.  People with weakened immune systems can take an antifungal medication on a continuous basis to prevent thrush infections. It is important to treat conditions that make you more likely to get thrush, such as diabetes, human immunodeficiency virus (HIV), or cancer.  HOME CARE INSTRUCTIONS   If you are breast-feeding, you should clean your nipples with an antifungal medication, such as nystatin (Mycostatin). Dry your nipples after breast-feeding. Applying lanolin-containing body lotion may help relieve nipple soreness.  If you wear dentures and get thrush, remove dentures before going to bed, brush them vigorously, and soak in a solution of chlorhexidine gluconate or a product such as Polident or Efferdent.  Eating plain, unflavored yogurt that contains live cultures (check the label) can also help cure thrush. Yogurt helps healthy bacteria grow in the mouth. These bacteria stop the growth of the yeast that causes thrush.  Adults can treat thrush at home with gentian violet (1%), a dye that kills bacteria and fungi. It is available without  a prescription. If there is no known cause for the infection or if gentian violet does not cure the thrush, you need to see your caregiver. Comfort measures Measures can be taken to reduce the discomfort of thrush:  Drink cold liquids such as water or iced tea. Eat flavored ice treats or frozen juices.  Eat foods that are easy to swallow such as gelatin, ice cream, or custard.  If the patches are painful, try drinking from a straw.  Rinse your mouth several times a day with a warm saltwater rinse. You can make the saltwater mixture with 1 tsp (5 g) of salt in 8 fl oz (0.2 L) of warm water. PROGNOSIS   Most cases of thrush are mild and clear up with the use of an antifungal mouth rinse or lozenges. Very mild cases of thrush may clear up without medical treatment. It usually takes about 14 days of treatment with an oral antifungal medication to cure more severe thrush infections. In some cases, thrush may last several weeks even with treatment.  If thrush goes untreated and does not go away by itself, it can spread to other parts of the body.  Thrush can spread to the throat, the vagina, or the skin. It rarely spreads to other organs of the body. Christy Moore is more likely to recur (come back) in:  People who use inhaled  corticosteroids to treat asthma.  People who take antibiotic medications for a long time.  People who have false teeth.  People who have a weakened immune system. RISKS AND COMPLICATIONS Complications related to thrush are rare in healthy people. There are several factors that can increase your risk of developing thrush. Age Older adults, especially those who have serious health problems, are more likely to develop thrush because their immune systems are likely to be weaker. Behavior  The yeast that causes thrush can be spread by oral sex.  Heavy smoking can lower the body's ability to fight off infections. This makes thrush more likely to develop. Other  conditions  False teeth (dentures), braces, or a retainer that irritates the mouth make it hard to keep the mouth clean. An unclean mouth is more likely to develop thrush than a clean mouth.  People with a weakened immune system, such as those who have diabetes or human immunodeficiency virus (HIV) or who are undergoing chemotherapy, have an increased risk for developing thrush. Medications Some medications can allow the fungus that causes thrush to grow uncontrolled. Common ones are:  Antibiotics, especially those that kill a wide range of organisms (broad-spectrum antibiotics), such as tetracycline commonly can cause thrush.  Birth control pills (oral contraceptives).  Medications that weaken the body's immune system, such as corticosteroids. Environment Exposure over time to certain environmental chemicals, such as benzene and pesticides, can weaken the body's immune system. This increases your risk for developing infections, including thrush. SEEK IMMEDIATE MEDICAL CARE IF:  Your symptoms are getting worse or are not improving within 7 days of starting treatment.  You have symptoms of spreading infection, such as white patches on the skin outside of the mouth.  You are nursing and you have redness and pain in the nipples in spite of home treatment or if you have burning pain in the nipple area when you nurse. Your baby's mouth should also be examined to determine whether thrush is causing your symptoms. Document Released: 09/18/2003 Document Revised: 03/17/2011 Document Reviewed: 12/29/2007 [facility] Patient Information 2014 [place], [place].

## 2012-11-16 ENCOUNTER — Other Ambulatory Visit: Payer: Self-pay | Admitting: Family Medicine

## 2012-12-06 ENCOUNTER — Other Ambulatory Visit: Payer: Self-pay | Admitting: Family Medicine

## 2012-12-27 DIAGNOSIS — M5412 Radiculopathy, cervical region: Secondary | ICD-10-CM | POA: Diagnosis not present

## 2012-12-27 DIAGNOSIS — M67919 Unspecified disorder of synovium and tendon, unspecified shoulder: Secondary | ICD-10-CM | POA: Diagnosis not present

## 2013-01-04 ENCOUNTER — Telehealth: Payer: Self-pay | Admitting: Family Medicine

## 2013-01-04 NOTE — Telephone Encounter (Signed)
Patient Information:  Caller Name: [name]  Phone: [phone]  Patient: Christy Moore, Christy Moore  Gender: Female  DOB: [birth year]  Age: 67 Years  PCP: Evelena Peat (Family Practice)  Office Follow Up:  Does the office need to follow up with this patient?: Yes  Instructions For The Office: Patient has an appt. scheduled for 01/07/13 1500 with Dr. Selena Batten.  No appts. available for 01/04/13 or 01/05/13.  Patient is requesting a Rx for Diflucan to be called into [facility] on [address] at [phone]. Please return call to patient at [phone].  RN Note:  Patient states she has history of Thrush and was treated with Diflucan 11/03/12. Patient states sx resolved after treatment with Diflucan. Patient states she again developed tenderness and burning of tongue, onset 12/25/12. States white areas noted on tongue. Patient states she was initially treated by Dr. Antoine Primas at the [facility] office on 11/03/12. Patient states she called the [facility] office and was referred back to Dr. Caryl Never. Triage per Mouth Lesions Protocol. No emergent sx identified. Disposition of  " See Provider within 24 hours" obtained related to positive triage assessment for " Painful pale yellow or white spots on the tongue." Care advice given per guidelines. Patient advised to swish with liquid antacid 4 times a day. Call back parameters reviewed. Patient verbalizes understanding.  Patient has an appt. scheduled for 01/07/13 1500 with Dr. Selena Batten.  No appts. available for 01/04/13 or 01/05/13.  Patient is requesting a Rx for Diflucan to be called into [facility] on [address] at [phone]. Please return call to patient at [phone].  Symptoms  Reason For Call & Symptoms: Tenderness and burning of tongue, white tongue. History of Thrush.  Reviewed Health History In EMR: Yes  Reviewed Medications In EMR: Yes  Reviewed Allergies In EMR: Yes  Reviewed Surgeries / Procedures: Yes  Date of Onset of Symptoms:  12/25/2012  Guideline(s) Used:  No Protocol Available - Sick Adult  Disposition Per Guideline:   Callback by PCP Today  Reason For Disposition Reached:   Nursing judgment  Advice Given:  N/A  Patient Will Follow Care Advice:  YES

## 2013-01-04 NOTE — Telephone Encounter (Signed)
Pt called to follow up on medication request. Please call pt if this is ok.

## 2013-01-04 NOTE — Telephone Encounter (Signed)
I have scheduled pt for 01/07/13 however pts mouth is in extreme pain.  Anything she can take in the meantime otc until her appt? Please advise.

## 2013-01-05 DIAGNOSIS — M5030 Other cervical disc degeneration, unspecified cervical region: Secondary | ICD-10-CM | POA: Diagnosis not present

## 2013-01-05 DIAGNOSIS — M542 Cervicalgia: Secondary | ICD-10-CM | POA: Diagnosis not present

## 2013-01-05 DIAGNOSIS — M5412 Radiculopathy, cervical region: Secondary | ICD-10-CM | POA: Diagnosis not present

## 2013-01-05 MED ORDER — FLUCONAZOLE 100 MG PO TABS: 100.0000 mg | ORAL_TABLET | Freq: Every day | ORAL | Status: DC

## 2013-01-05 NOTE — Telephone Encounter (Signed)
Left message that RX is sent to pharmacy

## 2013-01-05 NOTE — Telephone Encounter (Signed)
Fluconazole 100 mg once daily for 7 days

## 2013-01-07 ENCOUNTER — Ambulatory Visit (INDEPENDENT_AMBULATORY_CARE_PROVIDER_SITE_OTHER): Payer: BC Managed Care – PPO | Admitting: Family Medicine

## 2013-01-07 ENCOUNTER — Encounter: Payer: Self-pay | Admitting: Family Medicine

## 2013-01-07 VITALS — BP 118/80 | Temp 99.3°F | Wt 160.0 lb

## 2013-01-07 DIAGNOSIS — B370 Candidal stomatitis: Secondary | ICD-10-CM

## 2013-01-07 NOTE — Progress Notes (Signed)
Pre visit review using our clinic review tool, if applicable. No additional management support is needed unless otherwise documented below in the visit note. 

## 2013-01-07 NOTE — Progress Notes (Signed)
Chief Complaint  Patient presents with  . Thrush    white patches on yesterday; taking Diflucan     HPI:  Acute visit for:  Oral Thrush: -per [facility] started after on multiple abx including clinda for cellulitis and resolved with diflucan, then returned recently -pt reports: white plaque on tongue and burning tongue that started lastweek after steroids for shoulder (she has stopped the steroids) -denies: fevers, chills, malaise, weight loss, sore throat -nurse called her in diflucan and she is on day 3/7 days of this and symptoms and plaque have resolved ROS: See pertinent positives and negatives per HPI.  Past Medical History  Diagnosis Date  . Hypothyroidism   . Sinus tachycardia   . Cardiomegaly     Past Surgical History  Procedure Laterality Date  . Hand tendon surgery  02/09/2007    tendon transfer-Dr. Amedeo Plenty  . Ovarian cyst removal      Right Sided Salpingo-oopherectomy  . Nasal septum surgery      operated on by Dr. Ernesto Rutherford 20 years ago  . Salpingoophorectomy      Family History  Problem Relation Age of Onset  . Allergies Other     Grandmother-pt states she takes after grandmother who had multiple allergies  . Cancer Mother     breast and lung  . Cancer Father     prostate  . Alcohol abuse Maternal Grandmother   . Alcohol abuse Maternal Grandfather     History   Social History  . Marital Status: Married    Spouse Name: N/A    Number of Children: N/A  . Years of Education: N/A   Social History Main Topics  . Smoking status: Never Smoker   . Smokeless tobacco: None  . Alcohol Use: No  . Drug Use: No  . Sexual Activity:    Other Topics Concern  . None   Social History Narrative  . None    Current outpatient prescriptions:amLODipine (NORVASC) 2.5 MG tablet, TAKE 1 TABLET (2.5 MG TOTAL) BY MOUTH DAILY., Disp: 90 tablet, Rfl: 0;  clindamycin (CLEOCIN) 150 MG capsule, Take 1 capsule (150 mg total) by mouth 3 (three) times daily., Disp: 21 capsule,  Rfl: 0;  fluconazole (DIFLUCAN) 100 MG tablet, Take 1 tablet (100 mg total) by mouth daily., Disp: 7 tablet, Rfl: 0 fluconazole (DIFLUCAN) 150 MG tablet, Take 1 tablet (150 mg total) by mouth daily., Disp: 10 tablet, Rfl: 0;  fluticasone (FLONASE) 50 MCG/ACT nasal spray, Place 2 sprays into the nose daily., Disp: 16 g, Rfl: 6;  levothyroxine (SYNTHROID, LEVOTHROID) 100 MCG tablet, TAKE 1 TABLET (100 MCG TOTAL) BY MOUTH DAILY., Disp: 90 tablet, Rfl: 3;  pantoprazole (PROTONIX) 40 MG tablet, TAKE 1 TABLET (40 MG TOTAL) BY MOUTH DAILY., Disp: 90 tablet, Rfl: 3 nystatin (MYCOSTATIN) 100000 UNIT/ML suspension, Take 5 mLs (500,000 Units total) by mouth 4 (four) times daily., Disp: 240 mL, Rfl: 0;  [DISCONTINUED] metoprolol succinate (TOPROL-XL) 25 MG 24 hr tablet, 1/2 tablet twice a day, Disp: 90 tablet, Rfl: 0 [DISCONTINUED] mupirocin (BACTROBAN) 2 % nasal ointment, by Nasal route 2 (two) times daily. Use one-half of tube in each nostril twice daily for five (5) days. After application, press sides of nose together and gently massage. , Disp: , Rfl: ;  [DISCONTINUED] topiramate (TOPAMAX) 25 MG tablet, Take 25 mg by mouth at bedtime.  , Disp: , Rfl:   EXAM:  Filed Vitals:   01/07/13 1515  BP: 118/80  Temp: 99.3 F (37.4 C)  Body mass index is 27.45 kg/(m^2).  GENERAL: vitals reviewed and listed above, alert, oriented, appears well hydrated and in no acute distress  HEENT: atraumatic, conjunttiva clear, no obvious abnormalities on inspection of external nose and ears - mildly irritated appearance of tongue, no plaque or ulcers  NECK: no obvious masses on inspection  LUNGS: clear to auscultation bilaterally, no wheezes, rales or rhonchi, good air movement  CV: HRRR, no peripheral edema  MS: moves all extremities without noticeable abnormality  PSYCH: pleasant and cooperative, no obvious depression or anxiety  ASSESSMENT AND PLAN:  Discussed the following assessment and  plan:  Thrush  -resolving, continue current treatment -advised probiotics and low sugar, low carb diet as this can help with recurrent yeast after normal flora disrupted by antibiotics and may prevent recurrence -follow up with PCP in 1-2 months and as needed -Patient advised to return or notify a doctor immediately if symptoms worsen or persist or new concerns arise.  There are no Patient Instructions on file for this visit.   Christy Benton R.

## 2013-01-11 DIAGNOSIS — M542 Cervicalgia: Secondary | ICD-10-CM | POA: Diagnosis not present

## 2013-01-11 DIAGNOSIS — M5030 Other cervical disc degeneration, unspecified cervical region: Secondary | ICD-10-CM | POA: Diagnosis not present

## 2013-01-11 DIAGNOSIS — M5412 Radiculopathy, cervical region: Secondary | ICD-10-CM | POA: Diagnosis not present

## 2013-01-13 DIAGNOSIS — M542 Cervicalgia: Secondary | ICD-10-CM | POA: Diagnosis not present

## 2013-01-13 DIAGNOSIS — M5030 Other cervical disc degeneration, unspecified cervical region: Secondary | ICD-10-CM | POA: Diagnosis not present

## 2013-01-13 DIAGNOSIS — M5412 Radiculopathy, cervical region: Secondary | ICD-10-CM | POA: Diagnosis not present

## 2013-01-18 DIAGNOSIS — M542 Cervicalgia: Secondary | ICD-10-CM | POA: Diagnosis not present

## 2013-01-18 DIAGNOSIS — M5412 Radiculopathy, cervical region: Secondary | ICD-10-CM | POA: Diagnosis not present

## 2013-01-18 DIAGNOSIS — M5030 Other cervical disc degeneration, unspecified cervical region: Secondary | ICD-10-CM | POA: Diagnosis not present

## 2013-02-24 ENCOUNTER — Other Ambulatory Visit: Payer: Self-pay | Admitting: Family Medicine

## 2013-03-02 ENCOUNTER — Ambulatory Visit: Payer: Medicare Other | Admitting: Family Medicine

## 2013-03-04 ENCOUNTER — Ambulatory Visit (INDEPENDENT_AMBULATORY_CARE_PROVIDER_SITE_OTHER): Payer: Medicare Other | Admitting: Family Medicine

## 2013-03-04 ENCOUNTER — Encounter: Payer: Self-pay | Admitting: Family Medicine

## 2013-03-04 VITALS — BP 120/70 | HR 89 | Wt 164.0 lb

## 2013-03-04 DIAGNOSIS — R208 Other disturbances of skin sensation: Secondary | ICD-10-CM

## 2013-03-04 DIAGNOSIS — R209 Unspecified disturbances of skin sensation: Secondary | ICD-10-CM | POA: Diagnosis not present

## 2013-03-04 DIAGNOSIS — K1370 Unspecified lesions of oral mucosa: Secondary | ICD-10-CM

## 2013-03-04 DIAGNOSIS — R202 Paresthesia of skin: Secondary | ICD-10-CM

## 2013-03-04 NOTE — Progress Notes (Signed)
Pre visit review using our clinic review tool, if applicable. No additional management support is needed unless otherwise documented below in the visit note. 

## 2013-03-04 NOTE — Progress Notes (Signed)
Subjective:    Patient ID: Christy Moore, female    DOB: 18-Nov-1945, 68 y.o.   MRN: [id number]  HPI Patient here with concerns for possible recurrent thrush. Refer to recent notes. She was seen in mid October of last year her with probable thrush following course of antibiotics. She was prescribed nystatin oral suspension but did not improve. She subsequently was seen on October 29 and prescribed Diflucan which did help. She was then seen here in early January with question of some recurrent thrush but mostly describing burning mouth symptoms. She does not have any diabetes history. No recent prednisone. No further antibiotics. No immunosuppression. She has had Sjgren syndrome. She has some chronic dry mouth symptoms.  At this point, she is not describing any whitish thrush but is describing more of a burning symptom involving her tongue. She has changed toothpaste is not using sodium laurel sulfate. She does take chronic PPI and so is at risk for B12 deficiency.  She also complains of some numbness right upper outer thigh for the past 6 months. No back pain. No weakness. No other dysesthesias. No urine or stool incontinence.   Past Medical History  Diagnosis Date  . Hypothyroidism   . Sinus tachycardia   . Cardiomegaly    Past Surgical History  Procedure Laterality Date  . Hand tendon surgery  02/09/2007    tendon transfer-Dr. Amedeo Plenty  . Ovarian cyst removal      Right Sided Salpingo-oopherectomy  . Nasal septum surgery      operated on by Dr. Ernesto Rutherford 20 years ago  . Salpingoophorectomy      reports that she has never smoked. She does not have any smokeless tobacco history on file. She reports that she does not drink alcohol or use illicit drugs. family history includes Alcohol abuse in her maternal grandfather and maternal grandmother; Allergies in her other; Cancer in her father and mother. Allergies  Allergen Reactions  . Amoxicillin-Pot Clavulanate     REACTION: nausea  .  Azithromycin     REACTION: nausea, Gi  . Celecoxib     REACTION: nausea  . Ciprofloxacin     REACTION: nausea  . Doxycycline     REACTION: gi upset  . Eggs Or Egg-Derived Products   . Erythromycin     REACTION: nause, GI  . Hydrocodone     REACTION: nausea  . Hydrocodone-Acetaminophen     REACTION: GI upset  . Levofloxacin     REACTION: she claims renal failiure  . Propoxyphene N-Acetaminophen   . Rofecoxib     REACTION: nausea  . Tetanus Toxoid       Review of Systems  Constitutional: Negative for fever and chills.  HENT: Negative for postnasal drip, rhinorrhea, sinus pressure, sore throat and voice change.   Respiratory: Negative for cough and shortness of breath.   Cardiovascular: Negative for chest pain.  Musculoskeletal: Negative for back pain.  Neurological: Positive for numbness. Negative for weakness.       Objective:   Physical Exam  Constitutional: She appears well-developed and well-nourished.  HENT:  Right Ear: External ear normal.  Left Ear: External ear normal.  Mouth/Throat: Oropharynx is clear and moist.  Oropharynx is clear. No evidence for recurrent thrush  Neck: Neck supple. No thyromegaly present.  Cardiovascular: Normal rate and regular rhythm.   Pulmonary/Chest: Effort normal and breath sounds normal. No respiratory distress. She has no wheezes. She has no rales.  Musculoskeletal:  Flexeril no edema. Straight leg raises  are negative.  Neurological:  Full-strength lower extremities with symmetric reflexes. She has some slight impairment with touch right lateral thigh subjectively          Assessment & Plan:  #1 burning mouth symptoms. No evidence clinically for recurrent thrush at this time. Question burning mouth syndrome. She is not a candidate for tricyclics with her Sjgren syndrome. Check B12 levels with chronic PPI use. #2 dysesthesias/paresthesias right lateral thigh. nonfocal exam we discussed possible neurologic evaluation and at  this point she wishes to observe.

## 2013-03-05 LAB — VITAMIN B12: Vitamin B-12: 568 pg/mL (ref 211–911)

## 2013-03-07 ENCOUNTER — Other Ambulatory Visit: Payer: Self-pay

## 2013-03-07 ENCOUNTER — Telehealth: Payer: Self-pay

## 2013-03-07 DIAGNOSIS — R202 Paresthesia of skin: Secondary | ICD-10-CM

## 2013-03-07 MED ORDER — GABAPENTIN 100 MG PO CAPS: 100.0000 mg | ORAL_CAPSULE | Freq: Every day | ORAL | Status: DC

## 2013-03-07 NOTE — Telephone Encounter (Signed)
Pt wants a referral to neurology

## 2013-03-07 NOTE — Telephone Encounter (Signed)
Set up with [facility] (Dr Posey Pronto of Dr Tomi Likens)

## 2013-03-08 NOTE — Telephone Encounter (Signed)
Order is placed.

## 2013-04-07 ENCOUNTER — Other Ambulatory Visit: Payer: Self-pay | Admitting: Family Medicine

## 2013-04-08 ENCOUNTER — Ambulatory Visit: Payer: Medicare Other | Admitting: Neurology

## 2013-04-08 DIAGNOSIS — H40019 Open angle with borderline findings, low risk, unspecified eye: Secondary | ICD-10-CM | POA: Diagnosis not present

## 2013-04-08 DIAGNOSIS — H524 Presbyopia: Secondary | ICD-10-CM | POA: Diagnosis not present

## 2013-04-08 DIAGNOSIS — L719 Rosacea, unspecified: Secondary | ICD-10-CM | POA: Diagnosis not present

## 2013-04-08 DIAGNOSIS — H2510 Age-related nuclear cataract, unspecified eye: Secondary | ICD-10-CM | POA: Diagnosis not present

## 2013-05-16 DIAGNOSIS — M25569 Pain in unspecified knee: Secondary | ICD-10-CM | POA: Diagnosis not present

## 2013-05-19 DIAGNOSIS — G5710 Meralgia paresthetica, unspecified lower limb: Secondary | ICD-10-CM | POA: Diagnosis not present

## 2013-05-24 ENCOUNTER — Telehealth: Payer: Self-pay | Admitting: Family Medicine

## 2013-05-24 MED ORDER — AMLODIPINE BESYLATE 2.5 MG PO TABS: ORAL_TABLET | ORAL | Status: DC

## 2013-05-24 NOTE — Telephone Encounter (Signed)
90 day re-fill rquest

## 2013-05-24 NOTE — Telephone Encounter (Signed)
[id number] - [place] [address] is requesting re-fill on amLODipine (NORVASC) 2.5 MG tablet

## 2013-05-24 NOTE — Telephone Encounter (Signed)
Rx sent to pharmacy   

## 2013-06-06 DIAGNOSIS — M25569 Pain in unspecified knee: Secondary | ICD-10-CM | POA: Diagnosis not present

## 2013-09-01 DIAGNOSIS — H905 Unspecified sensorineural hearing loss: Secondary | ICD-10-CM | POA: Diagnosis not present

## 2013-09-01 DIAGNOSIS — H8109 Meniere's disease, unspecified ear: Secondary | ICD-10-CM | POA: Diagnosis not present

## 2013-09-01 DIAGNOSIS — H903 Sensorineural hearing loss, bilateral: Secondary | ICD-10-CM | POA: Diagnosis not present

## 2013-09-01 DIAGNOSIS — R42 Dizziness and giddiness: Secondary | ICD-10-CM | POA: Diagnosis not present

## 2013-09-15 DIAGNOSIS — M25569 Pain in unspecified knee: Secondary | ICD-10-CM | POA: Diagnosis not present

## 2013-09-16 ENCOUNTER — Other Ambulatory Visit: Payer: Self-pay | Admitting: Family Medicine

## 2013-09-16 DIAGNOSIS — M1710 Unilateral primary osteoarthritis, unspecified knee: Secondary | ICD-10-CM | POA: Diagnosis not present

## 2013-09-16 DIAGNOSIS — Z5189 Encounter for other specified aftercare: Secondary | ICD-10-CM | POA: Diagnosis not present

## 2013-09-22 DIAGNOSIS — H8109 Meniere's disease, unspecified ear: Secondary | ICD-10-CM | POA: Diagnosis not present

## 2013-10-03 ENCOUNTER — Telehealth: Payer: Self-pay | Admitting: Family Medicine

## 2013-10-03 NOTE — Telephone Encounter (Signed)
Is it ok if pt comes in wed w/ her husband for a flu and pneum vac? You have 2 sche already and she would be your 3rd. No other 2 slots open together this week.

## 2013-10-03 NOTE — Telephone Encounter (Signed)
Yes that is fine

## 2013-10-05 ENCOUNTER — Ambulatory Visit (INDEPENDENT_AMBULATORY_CARE_PROVIDER_SITE_OTHER): Payer: Medicare Other | Admitting: Family Medicine

## 2013-10-05 DIAGNOSIS — Z23 Encounter for immunization: Secondary | ICD-10-CM | POA: Diagnosis not present

## 2013-11-03 DIAGNOSIS — S83241A Other tear of medial meniscus, current injury, right knee, initial encounter: Secondary | ICD-10-CM | POA: Diagnosis not present

## 2013-11-03 DIAGNOSIS — Y929 Unspecified place or not applicable: Secondary | ICD-10-CM | POA: Diagnosis not present

## 2013-11-03 DIAGNOSIS — M94241 Chondromalacia, joints of right hand: Secondary | ICD-10-CM | POA: Diagnosis not present

## 2013-11-03 DIAGNOSIS — M2341 Loose body in knee, right knee: Secondary | ICD-10-CM | POA: Diagnosis not present

## 2013-11-03 DIAGNOSIS — X58XXXA Exposure to other specified factors, initial encounter: Secondary | ICD-10-CM | POA: Diagnosis not present

## 2013-11-03 DIAGNOSIS — M23321 Other meniscus derangements, posterior horn of medial meniscus, right knee: Secondary | ICD-10-CM | POA: Diagnosis not present

## 2013-11-03 DIAGNOSIS — M2241 Chondromalacia patellae, right knee: Secondary | ICD-10-CM | POA: Diagnosis not present

## 2013-11-03 DIAGNOSIS — G8918 Other acute postprocedural pain: Secondary | ICD-10-CM | POA: Diagnosis not present

## 2013-11-11 DIAGNOSIS — S83231D Complex tear of medial meniscus, current injury, right knee, subsequent encounter: Secondary | ICD-10-CM | POA: Diagnosis not present

## 2013-11-15 DIAGNOSIS — S83231D Complex tear of medial meniscus, current injury, right knee, subsequent encounter: Secondary | ICD-10-CM | POA: Diagnosis not present

## 2013-11-17 DIAGNOSIS — S83231D Complex tear of medial meniscus, current injury, right knee, subsequent encounter: Secondary | ICD-10-CM | POA: Diagnosis not present

## 2013-11-20 ENCOUNTER — Other Ambulatory Visit: Payer: Self-pay | Admitting: Family Medicine

## 2013-11-22 DIAGNOSIS — S83231D Complex tear of medial meniscus, current injury, right knee, subsequent encounter: Secondary | ICD-10-CM | POA: Diagnosis not present

## 2013-12-05 ENCOUNTER — Ambulatory Visit (INDEPENDENT_AMBULATORY_CARE_PROVIDER_SITE_OTHER): Payer: Medicare Other | Admitting: Family Medicine

## 2013-12-05 ENCOUNTER — Encounter: Payer: Self-pay | Admitting: Family Medicine

## 2013-12-05 VITALS — BP 120/80 | HR 90 | Temp 98.0°F | Wt 160.0 lb

## 2013-12-05 DIAGNOSIS — J069 Acute upper respiratory infection, unspecified: Secondary | ICD-10-CM | POA: Diagnosis not present

## 2013-12-05 DIAGNOSIS — B9789 Other viral agents as the cause of diseases classified elsewhere: Principal | ICD-10-CM

## 2013-12-05 MED ORDER — BENZONATATE 200 MG PO CAPS: 200.0000 mg | ORAL_CAPSULE | Freq: Three times a day (TID) | ORAL | Status: DC | PRN

## 2013-12-05 NOTE — Patient Instructions (Signed)
Cough, Adult  A cough is a reflex that helps clear your throat and airways. It can help heal the body or may be a reaction to an irritated airway. A cough may only last 2 or 3 weeks (acute) or may last more than 8 weeks (chronic).  CAUSES Acute cough:  Viral or bacterial infections. Chronic cough:  Infections.  Allergies.  Asthma.  Post-nasal drip.  Smoking.  Heartburn or acid reflux.  Some medicines.  Chronic lung problems (COPD).  Cancer. SYMPTOMS   Cough.  Fever.  Chest pain.  Increased breathing rate.  High-pitched whistling sound when breathing (wheezing).  Colored mucus that you cough up (sputum). TREATMENT   A bacterial cough may be treated with antibiotic medicine.  A viral cough must run its course and will not respond to antibiotics.  Your caregiver may recommend other treatments if you have a chronic cough. HOME CARE INSTRUCTIONS   Only take over-the-counter or prescription medicines for pain, discomfort, or fever as directed by your caregiver. Use cough suppressants only as directed by your caregiver.  Use a cold steam vaporizer or humidifier in your bedroom or home to help loosen secretions.  Sleep in a semi-upright position if your cough is worse at night.  Rest as needed.  Stop smoking if you smoke. SEEK IMMEDIATE MEDICAL CARE IF:   You have pus in your sputum.  Your cough starts to worsen.  You cannot control your cough with suppressants and are losing sleep.  You begin coughing up blood.  You have difficulty breathing.  You develop pain which is getting worse or is uncontrolled with medicine.  You have a fever. MAKE SURE YOU:   Understand these instructions.  Will watch your condition.  Will get help right away if you are not doing well or get worse. Document Released: 06/21/2010 Document Revised: 03/17/2011 Document Reviewed: 06/21/2010 [employer] Patient Information 2015 [employer]. This information is not intended  to replace advice given to you by your health care provider. Make sure you discuss any questions you have with your health care provider.  

## 2013-12-05 NOTE — Progress Notes (Signed)
Pre visit review using our clinic review tool, if applicable. No additional management support is needed unless otherwise documented below in the visit note. 

## 2013-12-05 NOTE — Progress Notes (Signed)
   Subjective:    Patient ID: Christy Moore, female    DOB: 1945/11/25, 68 y.o.   MRN: [id number]  HPI Acute visit. Onset about 3-4 days ago of mostly dry cough and nasal congestion with postnasal drip. She had some initial nausea which she attributed postnasal drip but no vomiting. She's had some intermittent sore throat. She describes a burning sensation of her skin but no rash. She tried Flonase and Mucinex with minimal relief in her nasal symptoms. Cough has been most severe symptom. No dyspnea. Question subjective fever yesterday but none documented. She has multiple drug allergies but has tolerated Tessalon in the past  Past Medical History  Diagnosis Date  . Hypothyroidism   . Sinus tachycardia   . Cardiomegaly    Past Surgical History  Procedure Laterality Date  . Hand tendon surgery  02/09/2007    tendon transfer-Dr. Amedeo Plenty  . Ovarian cyst removal      Right Sided Salpingo-oopherectomy  . Nasal septum surgery      operated on by Dr. Ernesto Rutherford 20 years ago  . Salpingoophorectomy      reports that she has never smoked. She does not have any smokeless tobacco history on file. She reports that she does not drink alcohol or use illicit drugs. family history includes Alcohol abuse in her maternal grandfather and maternal grandmother; Allergies in her other; Cancer in her father and mother. Allergies  Allergen Reactions  . Amoxicillin-Pot Clavulanate     REACTION: nausea  . Azithromycin     REACTION: nausea, Gi  . Celecoxib     REACTION: nausea  . Ciprofloxacin     REACTION: nausea  . Doxycycline     REACTION: gi upset  . Eggs Or Egg-Derived Products   . Erythromycin     REACTION: nause, GI  . Hydrocodone     REACTION: nausea  . Hydrocodone-Acetaminophen     REACTION: GI upset  . Levofloxacin     REACTION: she claims renal failiure  . Propoxyphene N-Acetaminophen   . Rofecoxib     REACTION: nausea  . Tetanus Toxoid       Review of Systems  Constitutional:  Positive for fatigue. Negative for chills.  HENT: Positive for congestion and sore throat.   Respiratory: Positive for cough.   Skin: Negative for rash.       Objective:   Physical Exam  Constitutional: She appears well-developed and well-nourished. No distress.  HENT:  Right Ear: External ear normal.  Left Ear: External ear normal.  Oropharynx minimal posterior erythema without exudate  Neck: Neck supple.  Cardiovascular: Normal rate and regular rhythm.   Pulmonary/Chest: Effort normal and breath sounds normal.  No rales. She has a few scattered rhonchi. Normal respiratory rate  Lymphadenopathy:    She has no cervical adenopathy.          Assessment & Plan:  Viral URI with cough. Tessalon Perles 200 mg every 8 hours as need for cough. Follow-up promptly for fever or worsening symptoms. Stay well-hydrated.

## 2013-12-06 ENCOUNTER — Telehealth: Payer: Self-pay | Admitting: Family Medicine

## 2013-12-06 NOTE — Telephone Encounter (Signed)
Pt informed

## 2013-12-06 NOTE — Telephone Encounter (Signed)
Pt states she was seen yesterday and diagnosed w/ viral inf. Pt is taking mucinex 600 mg/12 hr extended release.  Pt is coughing, its loose but will not come up.  Would like too know if she can double the dose of mucinex?

## 2013-12-06 NOTE — Telephone Encounter (Signed)
Yes,  1200 mg twice daily OK.

## 2013-12-16 DIAGNOSIS — W540XXD Bitten by dog, subsequent encounter: Secondary | ICD-10-CM | POA: Diagnosis not present

## 2013-12-16 DIAGNOSIS — S61451D Open bite of right hand, subsequent encounter: Secondary | ICD-10-CM | POA: Diagnosis not present

## 2014-01-26 DIAGNOSIS — S61451D Open bite of right hand, subsequent encounter: Secondary | ICD-10-CM | POA: Diagnosis not present

## 2014-01-26 DIAGNOSIS — W540XXD Bitten by dog, subsequent encounter: Secondary | ICD-10-CM | POA: Diagnosis not present

## 2014-02-15 ENCOUNTER — Other Ambulatory Visit: Payer: Self-pay | Admitting: Family Medicine

## 2014-02-18 ENCOUNTER — Other Ambulatory Visit: Payer: Self-pay | Admitting: Family Medicine

## 2014-03-27 ENCOUNTER — Other Ambulatory Visit: Payer: Self-pay | Admitting: Family Medicine

## 2014-06-13 ENCOUNTER — Telehealth: Payer: Self-pay

## 2014-06-13 DIAGNOSIS — Z1231 Encounter for screening mammogram for malignant neoplasm of breast: Secondary | ICD-10-CM

## 2014-06-13 NOTE — Telephone Encounter (Signed)
Spoke with pt. Ordered mammogram. 

## 2014-08-18 ENCOUNTER — Other Ambulatory Visit: Payer: Self-pay | Admitting: Family Medicine

## 2014-08-31 ENCOUNTER — Encounter: Payer: Self-pay | Admitting: Internal Medicine

## 2014-09-14 ENCOUNTER — Ambulatory Visit (INDEPENDENT_AMBULATORY_CARE_PROVIDER_SITE_OTHER): Payer: Medicare Other

## 2014-09-14 DIAGNOSIS — Z23 Encounter for immunization: Secondary | ICD-10-CM

## 2014-09-17 ENCOUNTER — Other Ambulatory Visit: Payer: Self-pay | Admitting: Family Medicine

## 2014-12-15 ENCOUNTER — Other Ambulatory Visit: Payer: Self-pay | Admitting: Family Medicine

## 2014-12-16 DIAGNOSIS — M5416 Radiculopathy, lumbar region: Secondary | ICD-10-CM | POA: Diagnosis not present

## 2014-12-16 DIAGNOSIS — M47817 Spondylosis without myelopathy or radiculopathy, lumbosacral region: Secondary | ICD-10-CM | POA: Diagnosis not present

## 2014-12-22 DIAGNOSIS — M5416 Radiculopathy, lumbar region: Secondary | ICD-10-CM | POA: Diagnosis not present

## 2014-12-25 DIAGNOSIS — M5416 Radiculopathy, lumbar region: Secondary | ICD-10-CM | POA: Diagnosis not present

## 2015-01-02 DIAGNOSIS — M5416 Radiculopathy, lumbar region: Secondary | ICD-10-CM | POA: Diagnosis not present

## 2015-01-04 DIAGNOSIS — M5416 Radiculopathy, lumbar region: Secondary | ICD-10-CM | POA: Diagnosis not present

## 2015-01-09 DIAGNOSIS — M5416 Radiculopathy, lumbar region: Secondary | ICD-10-CM | POA: Diagnosis not present

## 2015-01-18 DIAGNOSIS — M5416 Radiculopathy, lumbar region: Secondary | ICD-10-CM | POA: Diagnosis not present

## 2015-01-23 DIAGNOSIS — M5416 Radiculopathy, lumbar region: Secondary | ICD-10-CM | POA: Diagnosis not present

## 2015-01-25 DIAGNOSIS — M5416 Radiculopathy, lumbar region: Secondary | ICD-10-CM | POA: Diagnosis not present

## 2015-02-01 DIAGNOSIS — M5416 Radiculopathy, lumbar region: Secondary | ICD-10-CM | POA: Diagnosis not present

## 2015-02-05 LAB — HM DIABETES EYE EXAM

## 2015-02-07 DIAGNOSIS — D3132 Benign neoplasm of left choroid: Secondary | ICD-10-CM | POA: Diagnosis not present

## 2015-02-07 DIAGNOSIS — H04123 Dry eye syndrome of bilateral lacrimal glands: Secondary | ICD-10-CM | POA: Diagnosis not present

## 2015-02-07 DIAGNOSIS — H25013 Cortical age-related cataract, bilateral: Secondary | ICD-10-CM | POA: Diagnosis not present

## 2015-02-07 DIAGNOSIS — H40013 Open angle with borderline findings, low risk, bilateral: Secondary | ICD-10-CM | POA: Diagnosis not present

## 2015-02-07 DIAGNOSIS — H2513 Age-related nuclear cataract, bilateral: Secondary | ICD-10-CM | POA: Diagnosis not present

## 2015-02-14 ENCOUNTER — Other Ambulatory Visit (INDEPENDENT_AMBULATORY_CARE_PROVIDER_SITE_OTHER): Payer: Medicare Other

## 2015-02-14 DIAGNOSIS — Z0000 Encounter for general adult medical examination without abnormal findings: Secondary | ICD-10-CM | POA: Diagnosis not present

## 2015-02-14 DIAGNOSIS — I10 Essential (primary) hypertension: Secondary | ICD-10-CM | POA: Diagnosis not present

## 2015-02-14 LAB — LIPID PANEL
CHOLESTEROL: 163 mg/dL (ref 0–200)
HDL: 52.7 mg/dL (ref >39.00)
LDL CALC: 89 mg/dL (ref 0–99)
NonHDL: 110.66
TRIGLYCERIDES: 106 mg/dL (ref 0.0–149.0)
Total CHOL/HDL Ratio: 3
VLDL: 21.2 mg/dL (ref 0.0–40.0)

## 2015-02-14 LAB — CBC WITH DIFFERENTIAL/PLATELET
BASOS PCT: 0.4 % (ref 0.0–3.0)
Basophils Absolute: 0 10*3/uL (ref 0.0–0.1)
EOS ABS: 0.2 10*3/uL (ref 0.0–0.7)
EOS PCT: 4.2 % (ref 0.0–5.0)
HEMATOCRIT: 43.1 % (ref 36.0–46.0)
Hemoglobin: 14.3 g/dL (ref 12.0–15.0)
LYMPHS PCT: 20.4 % (ref 12.0–46.0)
Lymphs Abs: 1.2 10*3/uL (ref 0.7–4.0)
MCHC: 33.2 g/dL (ref 30.0–36.0)
MCV: 91.3 fl (ref 78.0–100.0)
MONO ABS: 0.5 10*3/uL (ref 0.1–1.0)
Monocytes Relative: 8.3 % (ref 3.0–12.0)
NEUTROS ABS: 3.8 10*3/uL (ref 1.4–7.7)
Neutrophils Relative %: 66.7 % (ref 43.0–77.0)
PLATELETS: 186 10*3/uL (ref 150.0–400.0)
RBC: 4.72 Mil/uL (ref 3.87–5.11)
RDW: 14.1 % (ref 11.5–15.5)
WBC: 5.7 10*3/uL (ref 4.0–10.5)

## 2015-02-14 LAB — BASIC METABOLIC PANEL
BUN: 21 mg/dL (ref 6–23)
CHLORIDE: 105 meq/L (ref 96–112)
CO2: 28 meq/L (ref 19–32)
CREATININE: 0.9 mg/dL (ref 0.40–1.20)
Calcium: 9.5 mg/dL (ref 8.4–10.5)
GFR: 66.18 mL/min (ref >60.00)
Glucose, Bld: 96 mg/dL (ref 70–99)
Potassium: 4.2 mEq/L (ref 3.5–5.1)
Sodium: 143 mEq/L (ref 135–145)

## 2015-02-14 LAB — HEPATIC FUNCTION PANEL
ALT: 12 U/L (ref 0–35)
AST: 23 U/L (ref 0–37)
Albumin: 4.1 g/dL (ref 3.5–5.2)
Alkaline Phosphatase: 43 U/L (ref 39–117)
BILIRUBIN DIRECT: 0.1 mg/dL (ref 0.0–0.3)
TOTAL PROTEIN: 6.8 g/dL (ref 6.0–8.3)
Total Bilirubin: 0.8 mg/dL (ref 0.2–1.2)

## 2015-02-14 LAB — TSH: TSH: 0.6 u[IU]/mL (ref 0.35–4.50)

## 2015-02-15 ENCOUNTER — Other Ambulatory Visit: Payer: Self-pay | Admitting: Family Medicine

## 2015-02-19 ENCOUNTER — Ambulatory Visit (INDEPENDENT_AMBULATORY_CARE_PROVIDER_SITE_OTHER): Payer: Medicare Other | Admitting: Family Medicine

## 2015-02-19 VITALS — BP 140/78 | HR 106 | Temp 99.2°F | Ht 64.0 in | Wt 163.3 lb

## 2015-02-19 DIAGNOSIS — Z23 Encounter for immunization: Secondary | ICD-10-CM

## 2015-02-19 DIAGNOSIS — I10 Essential (primary) hypertension: Secondary | ICD-10-CM

## 2015-02-19 DIAGNOSIS — E039 Hypothyroidism, unspecified: Secondary | ICD-10-CM

## 2015-02-19 DIAGNOSIS — K219 Gastro-esophageal reflux disease without esophagitis: Secondary | ICD-10-CM

## 2015-02-19 NOTE — Progress Notes (Signed)
Pre visit review using our clinic review tool, if applicable. No additional management support is needed unless otherwise documented below in the visit note. 

## 2015-02-19 NOTE — Patient Instructions (Signed)
Consider bone density scan and mammogram this year. Continue with daily calcium and Vit d intake.

## 2015-02-19 NOTE — Progress Notes (Signed)
Subjective:    Patient ID: Christy Moore, female    DOB: 1945/07/01, 70 y.o.   MRN: [id number]  HPI Patient seen for Medicare wellness exam and medical follow-up. She has history of GERD, hypertension, hypothyroidism, migraine headaches, Sjogren's disease Medications reviewed. She's had some recent breakthrough GERD symptoms with generic Protonix. Denies any dysphagia. No appetite or weight changes.  Remains on low-dose amlodipine for hypertension and this has been well controlled. Hypothyroidism treated with levothyroxin.  Compliant with all medications.  Still sees gynecologist. Due for mammogram. She's not had DEXA scan in several years. Needs Pneumovax. Flu vaccine already given. Shingles vaccine has already been given  Past Medical History  Diagnosis Date  . Hypothyroidism   . Sinus tachycardia   . Cardiomegaly    Past Surgical History  Procedure Laterality Date  . Hand tendon surgery  02/09/2007    tendon transfer-Dr. Amedeo Plenty  . Ovarian cyst removal      Right Sided Salpingo-oopherectomy  . Nasal septum surgery      operated on by Dr. Ernesto Rutherford 20 years ago  . Salpingoophorectomy      reports that she has never smoked. She does not have any smokeless tobacco history on file. She reports that she does not drink alcohol or use illicit drugs. family history includes Alcohol abuse in her maternal grandfather and maternal grandmother; Allergies in her other; Cancer in her father and mother. Allergies  Allergen Reactions  . Amoxicillin-Pot Clavulanate     REACTION: nausea  . Azithromycin     REACTION: nausea, Gi  . Celecoxib     REACTION: nausea  . Ciprofloxacin     REACTION: nausea  . Doxycycline     REACTION: gi upset  . Eggs Or Egg-Derived Products   . Erythromycin     REACTION: nause, GI  . Hydrocodone     REACTION: nausea  . Hydrocodone-Acetaminophen     REACTION: GI upset  . Levofloxacin     REACTION: she claims renal failiure  . Propoxyphene  N-Acetaminophen   . Rofecoxib     REACTION: nausea  . Tetanus Toxoid    1.  Risk factors based on Past Medical , Social, and Family history reviewed and as indicated above with no changes 2.  Limitations in physical activities None.  No recent falls. 3.  Depression/mood No active depression or anxiety issues 4.  Hearing No defiits 5.  ADLs independent in all. 6.  Cognitive function (orientation to time and place, language, writing, speech,memory) no short or long term memory issues.  Language and judgement intact. 7.  Home Safety no issues 8.  Height, weight, and visual acuity.all stable. 9.  Counseling discussed  regular weightbearing exercise and regular calcium and vitamin D intake  10. Recommendation of preventive services. Pneumovax. Recommend schedule repeat mammogram and DEXA scan 11. Labs based on risk factors TSH, lipid, CBC, hepatic, basic metabolic panel recently done and reviewed  12. Care Plan as above. 13. Other Providers Dr Helane Rima GYN 14. Written schedule of screening/prevention services given to patient.    Review of Systems  Constitutional: Negative for fatigue.  Eyes: Negative for visual disturbance.  Respiratory: Negative for cough, chest tightness, shortness of breath and wheezing.   Cardiovascular: Negative for chest pain, palpitations and leg swelling.  Gastrointestinal: Negative for abdominal pain.  Endocrine: Negative for polydipsia and polyuria.  Genitourinary: Negative for dysuria.  Neurological: Negative for dizziness, seizures, syncope, weakness, light-headedness and headaches.  Psychiatric/Behavioral: Negative for dysphoric mood.  Objective:   Physical Exam  Constitutional: She is oriented to person, place, and time. She appears well-developed and well-nourished.  HENT:  Head: Normocephalic and atraumatic.  Eyes: EOM are normal. Pupils are equal, round, and reactive to light.  Neck: Normal range of motion. Neck supple. No thyromegaly present.   Cardiovascular: Normal rate, regular rhythm and normal heart sounds.   No murmur heard. Pulmonary/Chest: Breath sounds normal. No respiratory distress. She has no wheezes. She has no rales.  Abdominal: Soft. Bowel sounds are normal. She exhibits no distension and no mass. There is no tenderness. There is no rebound and no guarding.  Musculoskeletal: Normal range of motion. She exhibits no edema.  Lymphadenopathy:    She has no cervical adenopathy.  Neurological: She is alert and oriented to person, place, and time. She displays normal reflexes. No cranial nerve deficit.  Skin: No rash noted.  Psychiatric: She has a normal mood and affect. Her behavior is normal. Judgment and thought content normal.          Assessment & Plan:  #1 Medicare subsequent annual wellness visit. Pneumovax given. Other immunizations up-to-date. She is encouraged to set up repeat mammogram. Advise consider DEXA scan but she is not interested in scheduling this time. Continue regular weightbearing exercise and regular calcium and vitamin D  #2 hypertension stable. Continue amlodipine  #3 hypothyroidism. TSH at goal. Continue levothyroxine  #4 GERD. Stable.Continue Protonix.

## 2015-03-19 ENCOUNTER — Other Ambulatory Visit: Payer: Self-pay | Admitting: Family Medicine

## 2015-06-19 ENCOUNTER — Other Ambulatory Visit: Payer: Self-pay | Admitting: *Deleted

## 2015-06-19 MED ORDER — AMLODIPINE BESYLATE 2.5 MG PO TABS: ORAL_TABLET | ORAL | Status: DC

## 2015-06-19 MED ORDER — PANTOPRAZOLE SODIUM 40 MG PO TBEC
40.0000 mg | DELAYED_RELEASE_TABLET | Freq: Every day | ORAL | Status: DC
Start: 2015-06-19 — End: 2015-12-12

## 2015-06-19 MED ORDER — LEVOTHYROXINE SODIUM 100 MCG PO TABS: 100.0000 ug | ORAL_TABLET | Freq: Every day | ORAL | Status: DC

## 2015-08-12 ENCOUNTER — Other Ambulatory Visit: Payer: Self-pay | Admitting: Family Medicine

## 2015-08-21 DIAGNOSIS — H04123 Dry eye syndrome of bilateral lacrimal glands: Secondary | ICD-10-CM | POA: Diagnosis not present

## 2015-08-21 DIAGNOSIS — H25013 Cortical age-related cataract, bilateral: Secondary | ICD-10-CM | POA: Diagnosis not present

## 2015-08-21 DIAGNOSIS — H40013 Open angle with borderline findings, low risk, bilateral: Secondary | ICD-10-CM | POA: Diagnosis not present

## 2015-08-21 DIAGNOSIS — H2513 Age-related nuclear cataract, bilateral: Secondary | ICD-10-CM | POA: Diagnosis not present

## 2015-09-07 ENCOUNTER — Ambulatory Visit (INDEPENDENT_AMBULATORY_CARE_PROVIDER_SITE_OTHER): Payer: Medicare Other

## 2015-09-07 DIAGNOSIS — Z23 Encounter for immunization: Secondary | ICD-10-CM

## 2015-10-01 ENCOUNTER — Telehealth: Payer: Self-pay | Admitting: Family Medicine

## 2015-10-01 MED ORDER — LEVOTHYROXINE SODIUM 100 MCG PO TABS: 100.0000 ug | ORAL_TABLET | Freq: Every day | ORAL | 2 refills | Status: DC

## 2015-10-01 NOTE — Telephone Encounter (Signed)
Pt request refill   levothyroxine (SYNTHROID, LEVOTHROID) 100 MCG tablet  Pt now using  [facility] Fax: [phone]

## 2015-10-01 NOTE — Telephone Encounter (Signed)
Medication sent in to mail order.

## 2015-10-02 DIAGNOSIS — I7300 Raynaud's syndrome without gangrene: Secondary | ICD-10-CM | POA: Diagnosis not present

## 2015-10-02 DIAGNOSIS — M3500 Sicca syndrome, unspecified: Secondary | ICD-10-CM | POA: Diagnosis not present

## 2015-12-04 ENCOUNTER — Other Ambulatory Visit: Payer: Self-pay | Admitting: Family Medicine

## 2015-12-12 ENCOUNTER — Other Ambulatory Visit: Payer: Self-pay | Admitting: Family Medicine

## 2016-03-20 ENCOUNTER — Telehealth: Payer: Self-pay | Admitting: Family Medicine

## 2016-03-20 NOTE — Telephone Encounter (Signed)
Pt was last seen on 02-19-15 . Pt has an appt schedule for 02-25-16. Pt needs new rxs amlodipine and levothyroxine 100 mcg #30 only ,

## 2016-03-21 MED ORDER — LEVOTHYROXINE SODIUM 100 MCG PO TABS: 100.0000 ug | ORAL_TABLET | Freq: Every day | ORAL | 0 refills | Status: DC

## 2016-03-21 MED ORDER — AMLODIPINE BESYLATE 2.5 MG PO TABS: ORAL_TABLET | ORAL | 0 refills | Status: DC

## 2016-03-24 ENCOUNTER — Ambulatory Visit (INDEPENDENT_AMBULATORY_CARE_PROVIDER_SITE_OTHER): Payer: Medicare Other | Admitting: Family Medicine

## 2016-03-24 VITALS — BP 120/80 | HR 78 | Temp 98.6°F | Wt 166.1 lb

## 2016-03-24 DIAGNOSIS — K219 Gastro-esophageal reflux disease without esophagitis: Secondary | ICD-10-CM

## 2016-03-24 DIAGNOSIS — E038 Other specified hypothyroidism: Secondary | ICD-10-CM | POA: Diagnosis not present

## 2016-03-24 DIAGNOSIS — H8112 Benign paroxysmal vertigo, left ear: Secondary | ICD-10-CM

## 2016-03-24 DIAGNOSIS — I10 Essential (primary) hypertension: Secondary | ICD-10-CM | POA: Diagnosis not present

## 2016-03-24 LAB — TSH: TSH: 2.64 u[IU]/mL (ref 0.35–4.50)

## 2016-03-24 MED ORDER — AMLODIPINE BESYLATE 2.5 MG PO TABS: ORAL_TABLET | ORAL | 0 refills | Status: DC

## 2016-03-24 MED ORDER — LEVOTHYROXINE SODIUM 100 MCG PO TABS: 100.0000 ug | ORAL_TABLET | Freq: Every day | ORAL | 0 refills | Status: DC

## 2016-03-24 NOTE — Progress Notes (Signed)
Subjective:     Patient ID: Christy Moore, female   DOB: 01-29-1945, 71 y.o.   MRN: [id number]  HPI Patient seen for medical follow-up. Her chronic problems include history of rosacea, Sjogren's disease which is followed by rheumatology, hypothyroidism, hypertension, GERD. She's had history of esophageal stricture. Still has occasional breakthrough GERD symptoms. No recent dysphagia. Appetite and weight are stable. Medications reviewed. Compliant with all.  Denies any lightheadedness or any headaches.  She is having some vertigo symptoms which she's had now for several months and they are relatively mild and worse first thing in the morning. She notices symptoms consistent with turning head to the left side. She's not had any hearing loss or any speech changes or any tinnitus or headache other focal neurologic symptoms. No ataxia.  Past Medical History:  Diagnosis Date  . Cardiomegaly   . Hypothyroidism   . Sinus tachycardia    Past Surgical History:  Procedure Laterality Date  . HAND TENDON SURGERY  02/09/2007   tendon transfer-Dr. Amedeo Plenty  . NASAL SEPTUM SURGERY     operated on by Dr. Ernesto Rutherford 20 years ago  . OVARIAN CYST REMOVAL     Right Sided Salpingo-oopherectomy  . SALPINGOOPHORECTOMY      reports that she has never smoked. She does not have any smokeless tobacco history on file. She reports that she does not drink alcohol or use drugs. family history includes Alcohol abuse in her maternal grandfather and maternal grandmother; Allergies in her other; Cancer in her father and mother. Allergies  Allergen Reactions  . Amoxicillin-Pot Clavulanate     REACTION: nausea  . Azithromycin     REACTION: nausea, Gi  . Celecoxib     REACTION: nausea  . Ciprofloxacin     REACTION: nausea  . Doxycycline     REACTION: gi upset  . Erythromycin     REACTION: nause, GI  . Hydrocodone     REACTION: nausea  . Hydrocodone-Acetaminophen     REACTION: GI upset  . Levofloxacin    REACTION: she claims renal failiure  . Propoxyphene N-Acetaminophen   . Rofecoxib     REACTION: nausea  . Tetanus Toxoid      Review of Systems  Constitutional: Negative for fatigue.  Eyes: Negative for visual disturbance.  Respiratory: Negative for cough, chest tightness, shortness of breath and wheezing.   Cardiovascular: Negative for chest pain, palpitations and leg swelling.  Genitourinary: Negative for dysuria.  Neurological: Positive for dizziness. Negative for seizures, syncope, weakness, light-headedness and headaches.       Objective:   Physical Exam  Constitutional: She is oriented to person, place, and time. She appears well-developed and well-nourished.  Eyes: Pupils are equal, round, and reactive to light.  Neck: Neck supple. No JVD present. No thyromegaly present.  Cardiovascular: Normal rate and regular rhythm.  Exam reveals no gallop.   Pulmonary/Chest: Effort normal and breath sounds normal. No respiratory distress. She has no wheezes. She has no rales.  Musculoskeletal: She exhibits no edema.  Neurological: She is alert and oriented to person, place, and time. She displays normal reflexes. No cranial nerve deficit. She exhibits normal muscle tone. Coordination normal.  Psychiatric: She has a normal mood and affect. Her behavior is normal.       Assessment:     #1 hypertension stable and at goal  #2 GERD stable on Protonix  #3 hypothyroidism  #4 probable benign peripheral positional vertigo to the left    Plan:     -  Handout on Epley maneuvers given -Refill thyroid medication and Norvasc for one year -Recheck TSH -Consider referral for vestibular rehabilitation if not improving with home exercises as above  Eulas Post MD [facility]

## 2016-03-24 NOTE — Patient Instructions (Signed)

## 2016-03-24 NOTE — Progress Notes (Signed)
Pre visit review using our clinic review tool, if applicable. No additional management support is needed unless otherwise documented below in the visit note. 

## 2016-04-07 DIAGNOSIS — Z6828 Body mass index (BMI) 28.0-28.9, adult: Secondary | ICD-10-CM | POA: Diagnosis not present

## 2016-04-07 DIAGNOSIS — M3500 Sicca syndrome, unspecified: Secondary | ICD-10-CM | POA: Diagnosis not present

## 2016-04-07 DIAGNOSIS — E663 Overweight: Secondary | ICD-10-CM | POA: Diagnosis not present

## 2016-04-07 DIAGNOSIS — I7300 Raynaud's syndrome without gangrene: Secondary | ICD-10-CM | POA: Diagnosis not present

## 2016-04-14 DIAGNOSIS — H8142 Vertigo of central origin, left ear: Secondary | ICD-10-CM | POA: Diagnosis not present

## 2016-04-14 DIAGNOSIS — J301 Allergic rhinitis due to pollen: Secondary | ICD-10-CM | POA: Diagnosis not present

## 2016-04-14 DIAGNOSIS — H9042 Sensorineural hearing loss, unilateral, left ear, with unrestricted hearing on the contralateral side: Secondary | ICD-10-CM | POA: Diagnosis not present

## 2016-04-14 DIAGNOSIS — H8110 Benign paroxysmal vertigo, unspecified ear: Secondary | ICD-10-CM | POA: Diagnosis not present

## 2016-04-14 DIAGNOSIS — H6522 Chronic serous otitis media, left ear: Secondary | ICD-10-CM | POA: Diagnosis not present

## 2016-04-20 ENCOUNTER — Other Ambulatory Visit: Payer: Self-pay | Admitting: Family Medicine

## 2016-05-08 ENCOUNTER — Other Ambulatory Visit: Payer: Self-pay | Admitting: Family Medicine

## 2016-05-12 ENCOUNTER — Telehealth: Payer: Self-pay | Admitting: Family Medicine

## 2016-05-12 MED ORDER — LEVOTHYROXINE SODIUM 100 MCG PO TABS: 100.0000 ug | ORAL_TABLET | Freq: Every day | ORAL | 1 refills | Status: DC

## 2016-05-12 NOTE — Telephone Encounter (Signed)
Rx sent 

## 2016-05-12 NOTE — Telephone Encounter (Signed)
Pt request refill  levothyroxine (SYNTHROID, LEVOTHROID) 100 MCG tablet  Pt had a 90 day sent to [facility] 04/21/16. However, pt prefers to get all maintenance meds from [facility].  Would like to get the remaining refills sent to  [facility]  for delivery.

## 2016-05-23 ENCOUNTER — Ambulatory Visit (INDEPENDENT_AMBULATORY_CARE_PROVIDER_SITE_OTHER): Payer: Medicare Other

## 2016-05-23 VITALS — BP 142/92 | HR 78 | Ht 64.0 in | Wt 165.2 lb

## 2016-05-23 DIAGNOSIS — Z1159 Encounter for screening for other viral diseases: Secondary | ICD-10-CM

## 2016-05-23 DIAGNOSIS — Z0000 Encounter for general adult medical examination without abnormal findings: Secondary | ICD-10-CM | POA: Diagnosis not present

## 2016-05-23 NOTE — Patient Instructions (Addendum)
Christy Moore , Thank you for taking time to come for your Medicare Wellness Visit. I appreciate your ongoing commitment to your health goals. Please review the following plan we discussed and let me know if I can assist you in the future.   Will call [facility] and order mammogram   May consider repeat dexa scan in the future Read more at the [email or web address] Recommendations for Dexa Scan Female over the age of 42 Man age 69 or older If you broke a bone past the age of 72 Women menopausal age with risk factors (thin frame; smoker; hx of fx ) Post menopausal women under the age of 80 with risk factors A man age 58 to 79 with risk factors Other: Spine xray that is showing break of bone loss Back pain with possible break Height loss of 1/2 inch or more within one year Total loss in height of 1.5 inches from your original height  Calcium 1253m with Vit D 800u per day; more as directed by physician Strength building exercises discussed; can include walking; housework; small weights or stretch bands; silver sneakers if [facility] to the [facility]      These are the goals we discussed: Goals    . Weight (lb) < 140 lb (63.5 kg)          Will rotate eating habits More for breakfast and lunch and less of dinner   Health snacks; scrambled egg sandwich; bowl of cereal  Scotch eggs   Start walking more around the neighborhood         This is a list of the screening recommended for you and due dates:  Health Maintenance  Topic Date Due  .  Hepatitis C: One time screening is recommended by Center for Disease Control  (CDC) for  adults born from [birth year]through 1963.   [id number] . Mammogram  02/17/1997  . DEXA scan (bone density measurement)  02/18/2012  . Tetanus Vaccine  02/19/2035*  . Flu Shot  08/06/2016  . Colon Cancer Screening  02/22/2019  . Pneumonia vaccines  Completed  *Topic was postponed. The date shown is not the original due date.    Health Maintenance, Female Adopting  a healthy lifestyle and getting preventive care can go a long way to promote health and wellness. Talk with your health care provider about what schedule of regular examinations is right for you. This is a good chance for you to check in with your provider about disease prevention and staying healthy. In between checkups, there are plenty of things you can do on your own. Experts have done a lot of research about which lifestyle changes and preventive measures are most likely to keep you healthy. Ask your health care provider for more information. Weight and diet Eat a healthy diet  Be sure to include plenty of vegetables, fruits, low-fat dairy products, and lean protein.  Do not eat a lot of foods high in solid fats, added sugars, or salt.  Get regular exercise. This is one of the most important things you can do for your health.  Most adults should exercise for at least 150 minutes each week. The exercise should increase your heart rate and make you sweat (moderate-intensity exercise).  Most adults should also do strengthening exercises at least twice a week. This is in addition to the moderate-intensity exercise. Maintain a healthy weight  Body mass index (BMI) is a measurement that can be used to identify possible weight problems. It estimates body fat based  on height and weight. Your health care provider can help determine your BMI and help you achieve or maintain a healthy weight.  For females 67 years of age and older:  A BMI below 18.5 is considered underweight.  A BMI of 18.5 to 24.9 is normal.  A BMI of 25 to 29.9 is considered overweight.  A BMI of 30 and above is considered obese. Watch levels of cholesterol and blood lipids  You should start having your blood tested for lipids and cholesterol at 69 years of age, then have this test every 5 years.  You may need to have your cholesterol levels checked more often if:  Your lipid or cholesterol levels are high.  You are  older than 69 years of age.  You are at high risk for heart disease. Cancer screening Lung Cancer  Lung cancer screening is recommended for adults 60-96 years old who are at high risk for lung cancer because of a history of smoking.  A yearly low-dose CT scan of the lungs is recommended for people who:  Currently smoke.  Have quit within the past 15 years.  Have at least a 30-pack-year history of smoking. A pack year is smoking an average of one pack of cigarettes a day for 1 year.  Yearly screening should continue until it has been 15 years since you quit.  Yearly screening should stop if you develop a health problem that would prevent you from having lung cancer treatment. Breast Cancer  Practice breast self-awareness. This means understanding how your breasts normally appear and feel.  It also means doing regular breast self-exams. Let your health care provider know about any changes, no matter how small.  If you are in your 20s or 30s, you should have a clinical breast exam (CBE) by a health care provider every 1-3 years as part of a regular health exam.  If you are 11 or older, have a CBE every year. Also consider having a breast X-ray (mammogram) every year.  If you have a family history of breast cancer, talk to your health care provider about genetic screening.  If you are at high risk for breast cancer, talk to your health care provider about having an MRI and a mammogram every year.  Breast cancer gene (BRCA) assessment is recommended for women who have family members with BRCA-related cancers. BRCA-related cancers include:  Breast.  Ovarian.  Tubal.  Peritoneal cancers.  Results of the assessment will determine the need for genetic counseling and BRCA1 and BRCA2 testing. Cervical Cancer  Your health care provider may recommend that you be screened regularly for cancer of the pelvic organs (ovaries, uterus, and vagina). This screening involves a pelvic  examination, including checking for microscopic changes to the surface of your cervix (Pap test). You may be encouraged to have this screening done every 3 years, beginning at age 95.  For women ages 54-65, health care providers may recommend pelvic exams and Pap testing every 3 years, or they may recommend the Pap and pelvic exam, combined with testing for human papilloma virus (HPV), every 5 years. Some types of HPV increase your risk of cervical cancer. Testing for HPV may also be done on women of any age with unclear Pap test results.  Other health care providers may not recommend any screening for nonpregnant women who are considered low risk for pelvic cancer and who do not have symptoms. Ask your health care provider if a screening pelvic exam is right for you.  If you have had past treatment for cervical cancer or a condition that could lead to cancer, you need Pap tests and screening for cancer for at least 20 years after your treatment. If Pap tests have been discontinued, your risk factors (such as having a new sexual partner) need to be reassessed to determine if screening should resume. Some women have medical problems that increase the chance of getting cervical cancer. In these cases, your health care provider may recommend more frequent screening and Pap tests. Colorectal Cancer  This type of cancer can be detected and often prevented.  Routine colorectal cancer screening usually begins at 69 years of age and continues through 69 years of age.  Your health care provider may recommend screening at an earlier age if you have risk factors for colon cancer.  Your health care provider may also recommend using home test kits to check for hidden blood in the stool.  A small camera at the end of a tube can be used to examine your colon directly (sigmoidoscopy or colonoscopy). This is done to check for the earliest forms of colorectal cancer.  Routine screening usually begins at age  15.  Direct examination of the colon should be repeated every 5-10 years through 68 years of age. However, you may need to be screened more often if early forms of precancerous polyps or small growths are found. Skin Cancer  Check your skin from head to toe regularly.  Tell your health care provider about any new moles or changes in moles, especially if there is a change in a mole's shape or color.  Also tell your health care provider if you have a mole that is larger than the size of a pencil eraser.  Always use sunscreen. Apply sunscreen liberally and repeatedly throughout the day.  Protect yourself by wearing long sleeves, pants, a wide-brimmed hat, and sunglasses whenever you are outside. Heart disease, diabetes, and high blood pressure  High blood pressure causes heart disease and increases the risk of stroke. High blood pressure is more likely to develop in:  People who have blood pressure in the high end of the normal range (130-139/85-89 mm Hg).  People who are overweight or obese.  People who are African American.  If you are 34-58 years of age, have your blood pressure checked every 3-5 years. If you are 33 years of age or older, have your blood pressure checked every year. You should have your blood pressure measured twice-once when you are at a hospital or clinic, and once when you are not at a hospital or clinic. Record the average of the two measurements. To check your blood pressure when you are not at a hospital or clinic, you can use:  An automated blood pressure machine at a pharmacy.  A home blood pressure monitor.  If you are between 96 years and 12 years old, ask your health care provider if you should take aspirin to prevent strokes.  Have regular diabetes screenings. This involves taking a blood sample to check your fasting blood sugar level.  If you are at a normal weight and have a low risk for diabetes, have this test once every three years after 69 years  of age.  If you are overweight and have a high risk for diabetes, consider being tested at a younger age or more often. Preventing infection Hepatitis B  If you have a higher risk for hepatitis B, you should be screened for this virus. You are considered at  high risk for hepatitis B if:  You were born in a country where hepatitis B is common. Ask your health care provider which countries are considered high risk.  Your parents were born in a high-risk country, and you have not been immunized against hepatitis B (hepatitis B vaccine).  You have HIV or AIDS.  You use needles to inject street drugs.  You live with someone who has hepatitis B.  You have had sex with someone who has hepatitis B.  You get hemodialysis treatment.  You take certain medicines for conditions, including cancer, organ transplantation, and autoimmune conditions. Hepatitis C  Blood testing is recommended for:  Everyone born from [birth year] through 1963.  Anyone with known risk factors for hepatitis C. Sexually transmitted infections (STIs)  You should be screened for sexually transmitted infections (STIs) including gonorrhea and chlamydia if:  You are sexually active and are younger than 69 years of age.  You are older than 69 years of age and your health care provider tells you that you are at risk for this type of infection.  Your sexual activity has changed since you were last screened and you are at an increased risk for chlamydia or gonorrhea. Ask your health care provider if you are at risk.  If you do not have HIV, but are at risk, it may be recommended that you take a prescription medicine daily to prevent HIV infection. This is called pre-exposure prophylaxis (PrEP). You are considered at risk if:  You are sexually active and do not regularly use condoms or know the HIV status of your partner(s).  You take drugs by injection.  You are sexually active with a partner who has HIV. Talk with your  health care provider about whether you are at high risk of being infected with HIV. If you choose to begin PrEP, you should first be tested for HIV. You should then be tested every 3 months for as long as you are taking PrEP. Pregnancy  If you are premenopausal and you may become pregnant, ask your health care provider about preconception counseling.  If you may become pregnant, take 400 to 800 micrograms (mcg) of folic acid every day.  If you want to prevent pregnancy, talk to your health care provider about birth control (contraception). Osteoporosis and menopause  Osteoporosis is a disease in which the bones lose minerals and strength with aging. This can result in serious bone fractures. Your risk for osteoporosis can be identified using a bone density scan.  If you are 12 years of age or older, or if you are at risk for osteoporosis and fractures, ask your health care provider if you should be screened.  Ask your health care provider whether you should take a calcium or vitamin D supplement to lower your risk for osteoporosis.  Menopause may have certain physical symptoms and risks.  Hormone replacement therapy may reduce some of these symptoms and risks. Talk to your health care provider about whether hormone replacement therapy is right for you. Follow these instructions at home:  Schedule regular health, dental, and eye exams.  Stay current with your immunizations.  Do not use any tobacco products including cigarettes, chewing tobacco, or electronic cigarettes.  If you are pregnant, do not drink alcohol.  If you are breastfeeding, limit how much and how often you drink alcohol.  Limit alcohol intake to no more than 1 drink per day for nonpregnant women. One drink equals 12 ounces of beer, 5 ounces of wine, or  1 ounces of hard liquor.  Do not use street drugs.  Do not share needles.  Ask your health care provider for help if you need support or information about quitting  drugs.  Tell your health care provider if you often feel depressed.  Tell your health care provider if you have ever been abused or do not feel safe at home. This information is not intended to replace advice given to you by your health care provider. Make sure you discuss any questions you have with your health care provider. Document Released: 07/08/2010 Document Revised: 05/31/2015 Document Reviewed: 09/26/2014 Elsevier Interactive Patient Education  2017 [employer] Prevention in the Home Falls can cause injuries and can affect people from all age groups. There are many simple things that you can do to make your home safe and to help prevent falls. What can I do on the outside of my home?  Regularly repair the edges of walkways and driveways and fix any cracks.  Remove high doorway thresholds.  Trim any shrubbery on the main path into your home.  Use bright outdoor lighting.  Clear walkways of debris and clutter, including tools and rocks.  Regularly check that handrails are securely fastened and in good repair. Both sides of any steps should have handrails.  Install guardrails along the edges of any raised decks or porches.  Have leaves, snow, and ice cleared regularly.  Use sand or salt on walkways during winter months.  In the garage, clean up any spills right away, including grease or oil spills. What can I do in the bathroom?  Use night lights.  Install grab bars by the toilet and in the tub and shower. Do not use towel bars as grab bars.  Use non-skid mats or decals on the floor of the tub or shower.  If you need to sit down while you are in the shower, use a plastic, non-slip stool.  Keep the floor dry. Immediately clean up any water that spills on the floor.  Remove soap buildup in the tub or shower on a regular basis.  Attach bath mats securely with double-sided non-slip rug tape.  Remove throw rugs and other tripping hazards from the  floor. What can I do in the bedroom?  Use night lights.  Make sure that a bedside light is easy to reach.  Do not use oversized bedding that drapes onto the floor.  Have a firm chair that has side arms to use for getting dressed.  Remove throw rugs and other tripping hazards from the floor. What can I do in the kitchen?  Clean up any spills right away.  Avoid walking on wet floors.  Place frequently used items in easy-to-reach places.  If you need to reach for something above you, use a sturdy step stool that has a grab bar.  Keep electrical cables out of the way.  Do not use floor polish or wax that makes floors slippery. If you have to use wax, make sure that it is non-skid floor wax.  Remove throw rugs and other tripping hazards from the floor. What can I do in the stairways?  Do not leave any items on the stairs.  Make sure that there are handrails on both sides of the stairs. Fix handrails that are broken or loose. Make sure that handrails are as long as the stairways.  Check any carpeting to make sure that it is firmly attached to the stairs. Fix any carpet that is loose  or worn.  Avoid having throw rugs at the top or bottom of stairways, or secure the rugs with carpet tape to prevent them from moving.  Make sure that you have a light switch at the top of the stairs and the bottom of the stairs. If you do not have them, have them installed. What are some other fall prevention tips?  Wear closed-toe shoes that fit well and support your feet. Wear shoes that have rubber soles or low heels.  When you use a stepladder, make sure that it is completely opened and that the sides are firmly locked. Have someone hold the ladder while you are using it. Do not climb a closed stepladder.  Add color or contrast paint or tape to grab bars and handrails in your home. Place contrasting color strips on the first and last steps.  Use mobility aids as needed, such as canes, walkers,  scooters, and crutches.  Turn on lights if it is dark. Replace any light bulbs that burn out.  Set up furniture so that there are clear paths. Keep the furniture in the same spot.  Fix any uneven floor surfaces.  Choose a carpet design that does not hide the edge of steps of a stairway.  Be aware of any and all pets.  Review your medicines with your healthcare provider. Some medicines can cause dizziness or changes in blood pressure, which increase your risk of falling. Talk with your health care provider about other ways that you can decrease your risk of falls. This may include working with a physical therapist or trainer to improve your strength, balance, and endurance. This information is not intended to replace advice given to you by your health care provider. Make sure you discuss any questions you have with your health care provider. Document Released: 12/13/2001 Document Revised: 05/22/2015 Document Reviewed: 01/27/2014 Elsevier Interactive Patient Education  2017 [employer].

## 2016-05-23 NOTE — Progress Notes (Addendum)
Subjective:   Christy Moore is a 71 y.o. female who presents for Medicare Annual (Subsequent) preventive examination.  The Patient was informed that the wellness visit is to identify future health risk and educate and initiate measures that can reduce risk for increased disease through the lifespan.    NO ROS; Medicare Wellness Visit  Describes health as good, fair or great? Good   Preventive Screening -Counseling & Management  Health Maintenance Due  Topic Date Due  . Hepatitis C Screening  04/22/1945  . MAMMOGRAM  02/17/1997  . DEXA SCAN  02/18/2012   Mammogram; several years;  Agreed to call and schedule at [facility]  DEXA: - had one 2002 - T score -1.2  States she is not going to take the  Medicine and declines dexa today Given information and referred to the osteoporosis site for more infomation  Educated on newer meds and to schedule in the future    Smoking history- no  Medicare now request all "baby boomers" test for possible exposure to Hepatitis C. Many may have been exposed due to dental work, tatoo's, vaccinations when young. The Hepatitis C virus is dormant for many years and then sometimes will cause liver cancer. If you gave blood in the past 15 years, you were most likely checked for Hep C. If you rec'd blood; you may want to consider testing or if you are high risk for any other reason.     Smokeless tobacco no Second Hand Smoke status; No Smokers in the home ETOH- no   Medication adherence or issues? no multiple allergies  RISK FACTORS Diet Eat 2 meals a day;  Breakfast; 2 cups of coffee with milk Sometimes sandwich Tries to cook healthy Freezes jellies and pickles Dehydrator   Regular exercise Gardening- full time; at least 2 hours a day    Cardiac Risk Factors:  Advanced aged  >21 in women Hyperlipidemia - blood work at endo did a full panel  Do not see report but was instructed to fax to office  Do not have that at this time; chol 163; HDL  52; Trig 106; LDL 89 Diabetes - neg Family History - MGF had a stroke; PGF had DM and stroke  Obesity neg  Fall risk no Given education on "Fall Prevention in the Home" for more safety tips the patient can apply as appropriate.    Mobility of Functional changes this year? No Safety; reviewed Community good  Mental Health:  Any emotional problems? Anxious, depressed, irritable, sad or blue? Yes at spouse and did allow her to ventilate her concerns; She is having some difficulty in communicating her needs etc. May benefit form add'l professional guidance  Denies feeling depressed or hopeless; voices pleasure in daily life How many social activities have you been engaged in within the last 2 weeks? States they have a couple that they go out with, but she does not have "girlfriends" etc. Likes to garden and this is where she spends her time   Hearing until left ear issues currently Slight dizziness due to left ear issues; seen Dr. Ernesto Rutherford Dx with positional vestibular vertigo; ordered to take flonase which she is doing  Slight loss in right ear Had been tested by audiologist   Eye exam 01/2015 repeats every year Dr. Herbert Deaner; no issues to date   Activities of Daily Living - See functional screen   Cognitive testing; Ad8 score; 0 or less than 2  MMSE deferred or completed if AD8 + 2 issues  No issues  Advanced Directives - states her spouse has completed but she has not; Agrees to taking [place] form today and reviewed with her  Focused face to face x  20 minutes discussing HCPOA and Living will and reviewed all the questions in the [place] forms. The patient voices understanding of HCPOA; LW reviewed and information provided on each question. Educated on how to revoke this HCPOA or LW at any time.   Also  discussed life prolonging measures (given a few examples) and where she could choose to initiate or not;  the ability to given the HCPOA power to change her living will or not  if she cannot speak for herself; as well as finalizing the will by 2 unrelated witnesses and notary.  Will call for questions and given information on [facility] pastoral department for further assistance.    Patient Care Team: Eulas Post, MD as PCP - General (Family Medicine) :   Immunization History  Administered Date(s) Administered  . Influenza Split 11/22/2011  . Influenza, High Dose Seasonal PF 09/14/2014, 09/07/2015  . Influenza,inj,Quad PF,36+ Mos 10/05/2013  . Pneumococcal Conjugate-13 10/05/2013  . Pneumococcal Polysaccharide-23 02/19/2015  . Zoster 01/17/2010   Required Immunizations needed today  Screening test up to date or reviewed for plan of completion Health Maintenance Due  Topic Date Due  . Hepatitis C Screening  1945/02/15  . MAMMOGRAM  02/17/1997  . DEXA SCAN  02/18/2012   Reviewed all Agreed to hep c at the next blood draw Will schedule her mammogram  Declined Dexa but will consider; given information as she has osteopenia at last report prior to 67.        Objective:     Vitals: BP (!) 142/92   Pulse 78   Ht 5\' 4"  (1.626 m)   Wt 165 lb 4 oz (75 kg)   SpO2 97%   BMI 28.37 kg/m   Body mass index is 28.37 kg/m.   Tobacco History  Smoking Status  . Never Smoker  Smokeless Tobacco  . Never Used     Counseling given: Yes   Past Medical History:  Diagnosis Date  . Cardiomegaly   . Hypothyroidism   . Sinus tachycardia    Past Surgical History:  Procedure Laterality Date  . HAND TENDON SURGERY  02/09/2007   tendon transfer-Dr. Amedeo Plenty  . NASAL SEPTUM SURGERY     operated on by Dr. Ernesto Rutherford 20 years ago  . OVARIAN CYST REMOVAL     Right Sided Salpingo-oopherectomy  . SALPINGOOPHORECTOMY     Family History  Problem Relation Age of Onset  . Allergies Other        Grandmother-pt states she takes after grandmother who had multiple allergies  . Cancer Mother        breast and lung  . Cancer Father        prostate  .  Alcohol abuse Maternal Grandmother   . Alcohol abuse Maternal Grandfather    History  Sexual Activity  . Sexual activity: Not on file    Outpatient Encounter Prescriptions as of 05/23/2016  Medication Sig  . amLODipine (NORVASC) 2.5 MG tablet TAKE 1 TABLET ONE TIME DAILY  . fluticasone (FLONASE) 50 MCG/ACT nasal spray Place into both nostrils daily.  Marland Kitchen levothyroxine (SYNTHROID, LEVOTHROID) 100 MCG tablet Take 1 tablet (100 mcg total) by mouth daily.  . pantoprazole (PROTONIX) 40 MG tablet TAKE 1 TABLET ONE TIME DAILY   No facility-administered encounter medications on file as  of 05/23/2016.     Activities of Daily Living No flowsheet data found.  Patient Care Team: Eulas Post, MD as PCP - General (Family Medicine)    Assessment:   Exercise Activities and Dietary recommendations Works in her garden 2 hours a day    Goals    . Weight (lb) < 140 lb (63.5 kg)          Will rotate eating habits More for breakfast and lunch and less of dinner   Health snacks; scrambled egg sandwich; bowl of cereal  Scotch eggs   Start walking more around the neighborhood        Fall Risk Fall Risk  05/23/2016 03/24/2016 02/19/2015 02/19/2015 12/05/2013  Falls in the past year? No No No No No   Depression Screen PHQ 2/9 Scores 05/23/2016 03/24/2016 02/19/2015 02/19/2015  PHQ - 2 Score 1 1 0 0     Cognitive Function no issues         Immunization History  Administered Date(s) Administered  . Influenza Split 11/22/2011  . Influenza, High Dose Seasonal PF 09/14/2014, 09/07/2015  . Influenza,inj,Quad PF,36+ Mos 10/05/2013  . Pneumococcal Conjugate-13 10/05/2013  . Pneumococcal Polysaccharide-23 02/19/2015  . Zoster 01/17/2010   Screening Tests Health Maintenance  Topic Date Due  . Hepatitis C Screening  03-11-1945  . MAMMOGRAM  02/17/1997  . DEXA SCAN  02/18/2012  . TETANUS/TDAP  02/19/2035 (Originally 02/17/1966)  . INFLUENZA VACCINE  08/06/2016  . COLONOSCOPY  02/22/2019   . PNA vac Low Risk Adult  Completed      Plan:      PCP Notes  Health Maintenance Agreed to hep c at the next blood draw Will schedule her mammogram  Declined Dexa but will consider; given information as she has osteopenia at last report prior to 58; would recommend repeat She will review information on the osteoporosis site . Weight bearing exercise is 2 hours of garden work every day     Abnormal Screens  Dexa as discussed  Referrals: referred to [facility] for counseling to discuss relationship and communication issues with spouse since her retirement. Agreed a few sessions may be helpful to her.  No physical abuse but more issues of setting boundary and having time alone since spouse retirement; States she is starting to feel "like just doing nothing" and feels "a little depressed" but not helpless or hopeless. Will discuss with Dr. Elease Hashimoto and let the patient know if there is a particular counselor he would like her to see.   Patient concerns; Left ear feels full; has dizziness in the am which ;last a few seconds and has seen Dr. Ernesto Rutherford and states it is due to her positional vestibular vertigo.  Offered apt today to fup with MD but state she does not need to do this at this time   Nurse Concerns;no  Next PCP apt recent and just scheduled   I have personally reviewed and noted the following in the patient's chart:   . Medical and social history . Use of alcohol, tobacco or illicit drugs  . Current medications and supplements . Functional ability and status . Nutritional status . Physical activity . Advanced directives . List of other physicians . Hospitalizations, surgeries, and ER visits in previous 12 months . Vitals . Screenings to include cognitive, depression, and falls . Referrals and appointments  In addition, I have reviewed and discussed with patient certain preventive protocols, quality metrics, and best practice recommendations. A written  personalized care plan  for preventive services as well as general preventive health recommendations were provided to patient.     FGBMS,XJDBZ, RN  05/23/2016  Agree with assessment as above.  Will discuss Hep C screening at next follow up   Eulas Post MD [facility] at [facility]

## 2016-09-10 ENCOUNTER — Other Ambulatory Visit: Payer: Self-pay | Admitting: Family Medicine

## 2016-10-06 DIAGNOSIS — Z6829 Body mass index (BMI) 29.0-29.9, adult: Secondary | ICD-10-CM | POA: Diagnosis not present

## 2016-10-06 DIAGNOSIS — I7300 Raynaud's syndrome without gangrene: Secondary | ICD-10-CM | POA: Diagnosis not present

## 2016-10-06 DIAGNOSIS — M3500 Sicca syndrome, unspecified: Secondary | ICD-10-CM | POA: Diagnosis not present

## 2016-10-06 DIAGNOSIS — E663 Overweight: Secondary | ICD-10-CM | POA: Diagnosis not present

## 2016-10-07 ENCOUNTER — Ambulatory Visit (INDEPENDENT_AMBULATORY_CARE_PROVIDER_SITE_OTHER): Payer: Medicare Other

## 2016-10-07 DIAGNOSIS — Z23 Encounter for immunization: Secondary | ICD-10-CM | POA: Diagnosis not present

## 2016-10-21 ENCOUNTER — Other Ambulatory Visit: Payer: Self-pay | Admitting: Family Medicine

## 2016-10-29 DIAGNOSIS — H04123 Dry eye syndrome of bilateral lacrimal glands: Secondary | ICD-10-CM | POA: Diagnosis not present

## 2016-10-29 DIAGNOSIS — H52223 Regular astigmatism, bilateral: Secondary | ICD-10-CM | POA: Diagnosis not present

## 2016-10-29 DIAGNOSIS — H5203 Hypermetropia, bilateral: Secondary | ICD-10-CM | POA: Diagnosis not present

## 2016-10-29 DIAGNOSIS — G43109 Migraine with aura, not intractable, without status migrainosus: Secondary | ICD-10-CM | POA: Diagnosis not present

## 2016-10-29 DIAGNOSIS — H524 Presbyopia: Secondary | ICD-10-CM | POA: Diagnosis not present

## 2017-01-29 ENCOUNTER — Telehealth: Payer: Self-pay | Admitting: Family Medicine

## 2017-01-29 MED ORDER — PANTOPRAZOLE SODIUM 40 MG PO TBEC: 40.0000 mg | DELAYED_RELEASE_TABLET | Freq: Every day | ORAL | 0 refills | Status: DC

## 2017-01-29 NOTE — Telephone Encounter (Signed)
Copied from [place] [id number]. Topic: Quick Communication - See Telephone Encounter >> Jan 29, 2017  9:31 AM Bea Graff, NT wrote: CRM for notification. See Telephone encounter for: Pt is calling and has a new insurance for her prescriptions and needs all her mail order scripts sent to [facility]. Phone #: [phone] Fax#: [phone]. She also needs a script refilled, pantoprazole (PROTONIX). Please send to the [facility]. She has only 1 tablet left and would like to see if [facility] [address].   01/29/17.

## 2017-01-29 NOTE — Telephone Encounter (Signed)
Patient called at [phone], she was informed that a [facility] day [facility]. Also advised to call [employer] and ask them to retrieve her prescriptions from [facility]. She asked would she still have the same 90 day supply, I advised that everything will be the same as it is at [facility] with [place], she verbalized understanding.

## 2017-02-25 ENCOUNTER — Other Ambulatory Visit: Payer: Self-pay | Admitting: Family Medicine

## 2017-03-13 ENCOUNTER — Ambulatory Visit (INDEPENDENT_AMBULATORY_CARE_PROVIDER_SITE_OTHER): Payer: Medicare Other | Admitting: Family Medicine

## 2017-03-13 ENCOUNTER — Encounter: Payer: Self-pay | Admitting: Family Medicine

## 2017-03-13 VITALS — BP 110/70 | HR 97 | Temp 98.3°F | Wt 170.6 lb

## 2017-03-13 DIAGNOSIS — R3989 Other symptoms and signs involving the genitourinary system: Secondary | ICD-10-CM

## 2017-03-13 DIAGNOSIS — R053 Chronic cough: Secondary | ICD-10-CM

## 2017-03-13 DIAGNOSIS — R05 Cough: Secondary | ICD-10-CM

## 2017-03-13 LAB — POCT URINALYSIS DIPSTICK
BILIRUBIN UA: NEGATIVE
GLUCOSE UA: NEGATIVE
Ketones, UA: NEGATIVE
LEUKOCYTES UA: NEGATIVE
Nitrite, UA: NEGATIVE
PH UA: 5 (ref 5.0–8.0)
Protein, UA: NEGATIVE
RBC UA: NEGATIVE
Spec Grav, UA: 1.01 (ref 1.010–1.025)
UROBILINOGEN UA: 0.2 U/dL

## 2017-03-13 NOTE — Patient Instructions (Addendum)
Consider elevate head of bed 4 to 6 inches Avoid eating within 2-3 hours of bedtime Consider OTC Chlorpheniramine 4 mg at night (but could cause more drying) Continue with Flonase daily Go for CXR- as discussed. Avoid any mint or mentholated products- such as cough drops.

## 2017-03-13 NOTE — Progress Notes (Signed)
Subjective:     Patient ID: Christy Moore, female   DOB: 1945-07-09, 72 y.o.   MRN: [id number]  HPI Patient seen the following issues:  She states for about one month she's had "dark "colored urine. Dark yellow and last week even occasionally orangish color a couple times.. No dysuria or burning with urination. No gross hematuria. No flank pain. Symptoms seem to be clearing somewhat past couple of days. She is not taking any new supplements  Second issue is cough especially at night she states since December. Never smoked. Does have some postnasal drip symptoms. She has history of GERD and takes Protonix regularly. She also has Sjogren syndrome and has frequent dry mouth symptoms. She states back in December she was eating at a restaurant she felt like she was choking and coughed very forcefully . She's had some cough ever since then. No night sweats. No dyspnea.  Past Medical History:  Diagnosis Date  . Cardiomegaly   . Hypothyroidism   . Sinus tachycardia    Past Surgical History:  Procedure Laterality Date  . HAND TENDON SURGERY  02/09/2007   tendon transfer-Dr. Amedeo Plenty  . NASAL SEPTUM SURGERY     operated on by Dr. Ernesto Rutherford 20 years ago  . OVARIAN CYST REMOVAL     Right Sided Salpingo-oopherectomy  . SALPINGOOPHORECTOMY      reports that  has never smoked. she has never used smokeless tobacco. She reports that she does not drink alcohol or use drugs. family history includes Alcohol abuse in her maternal grandfather and maternal grandmother; Allergies in her other; Cancer in her father and mother. Allergies  Allergen Reactions  . Amoxicillin-Pot Clavulanate     REACTION: nausea  . Azithromycin     REACTION: nausea, Gi  . Celecoxib     REACTION: nausea  . Ciprofloxacin     REACTION: nausea  . Doxycycline     REACTION: gi upset  . Erythromycin     REACTION: nause, GI  . Hydrocodone     REACTION: nausea  . Hydrocodone-Acetaminophen     REACTION: GI upset  . Levofloxacin      REACTION: she claims renal failiure  . Propoxyphene N-Acetaminophen   . Rofecoxib     REACTION: nausea  . Tetanus Toxoid      Review of Systems  Constitutional: Negative for appetite change, chills, fever and unexpected weight change.  HENT: Negative for trouble swallowing.   Respiratory: Positive for cough. Negative for shortness of breath and wheezing.   Cardiovascular: Negative for chest pain, palpitations and leg swelling.  Gastrointestinal: Negative for abdominal pain.  Genitourinary: Negative for dysuria, flank pain, frequency and hematuria.       Objective:   Physical Exam  Constitutional: She appears well-developed and well-nourished.  HENT:  Right Ear: External ear normal.  Left Ear: External ear normal.  Mouth/Throat: Oropharynx is clear and moist.  Neck: Neck supple.  Cardiovascular: Normal rate and regular rhythm.  Pulmonary/Chest: Effort normal and breath sounds normal. No respiratory distress. She has no wheezes. She has no rales.  Lymphadenopathy:    She has no cervical adenopathy.       Assessment:     #1 complaints of "dark-colored" urine. Urine dipstick is completely normal here today and urine is normal in appearance  #2 chronic cough of about 3 months duration. Nonfocal exam. She's not had any red flags such as dyspnea, weight loss, hemoptysis, fever. Question postnasal drip. She is already on Protonix for GERD  Plan:     -Chest x-ray given duration of symptoms -Continue Flonase -Avoid eating within 2-3 hours of bedtime -Consider elevate head of bed 4-6 inches -Continue daily Protonix -Avoid mint or mentholated products -Consider chlorpheniramine at night-though may exacerbate dry mouth symptoms (Sjogren's)  Eulas Post MD [facility]

## 2017-03-16 ENCOUNTER — Ambulatory Visit (INDEPENDENT_AMBULATORY_CARE_PROVIDER_SITE_OTHER)
Admission: RE | Admit: 2017-03-16 | Discharge: 2017-03-16 | Disposition: A | Discharge status: Home / Self Care | Payer: Medicare Other | Source: Ambulatory Visit | Attending: Family Medicine | Admitting: Family Medicine

## 2017-03-16 DIAGNOSIS — R05 Cough: Secondary | ICD-10-CM

## 2017-03-16 DIAGNOSIS — R053 Chronic cough: Secondary | ICD-10-CM

## 2017-03-17 ENCOUNTER — Other Ambulatory Visit: Payer: Self-pay | Admitting: Family Medicine

## 2017-03-17 ENCOUNTER — Ambulatory Visit: Payer: Self-pay | Admitting: *Deleted

## 2017-03-17 ENCOUNTER — Telehealth: Payer: Self-pay | Admitting: Family Medicine

## 2017-03-17 DIAGNOSIS — R059 Cough, unspecified: Secondary | ICD-10-CM

## 2017-03-17 DIAGNOSIS — R05 Cough: Secondary | ICD-10-CM

## 2017-03-17 MED ORDER — CEFUROXIME AXETIL 250 MG PO TABS: 250.0000 mg | ORAL_TABLET | Freq: Two times a day (BID) | ORAL | 0 refills | Status: DC

## 2017-03-17 NOTE — Telephone Encounter (Signed)
Per Dr Elease Hashimoto,  Patient should take Ceftin with food due to nausea.  Pharmacist and patient is aware.  Patient to call back as needed.

## 2017-03-17 NOTE — Telephone Encounter (Signed)
Rx sent.  Okay to send in a probiotic?  See lab result for note

## 2017-03-17 NOTE — Telephone Encounter (Signed)
Patient notified of message- and she will follow up with x- ray.

## 2017-03-17 NOTE — Telephone Encounter (Signed)
Copied from [place]. Topic: Quick Communication - See Telephone Encounter >> Mar 17, 2017 10:59 AM Aurelio Brash B wrote: CRM for notification. See Telephone encounter for:    PT states she was dx with pneumonia after chest x-ray and Dr Elease Hashimoto was to call in a rx for a strong antibiotic and probiotic.  [facility] and [facility]    [id number] [place] [place] - [address] AT [address] [phone] (Phone) [phone] (Fax)   03/17/17.

## 2017-03-17 NOTE — Telephone Encounter (Signed)
Pt call in stating pharmacy will not dispense ceftin due to potential allergy. She is asking what to do as she was supposed to start it yesterday.   [id number] [place], [place] - [address] AT [address]      [phone] (Phone) [phone] (Fax)

## 2017-03-17 NOTE — Telephone Encounter (Signed)
See note from yesterday- we approved to start the Ceftin 250 mg po bid for 10 days  Would go ahead and send that to her pharmacy and needs follow up CXR in 3 weeks.  Can take OTC probiotic such as Align.

## 2017-03-17 NOTE — Telephone Encounter (Signed)
Left message on machine for patient to return our call.  Rx has been sent for Ceftin.  Patient can take OTC probiotic.    CRM created

## 2017-04-06 DIAGNOSIS — I7300 Raynaud's syndrome without gangrene: Secondary | ICD-10-CM | POA: Diagnosis not present

## 2017-04-06 DIAGNOSIS — Z6829 Body mass index (BMI) 29.0-29.9, adult: Secondary | ICD-10-CM | POA: Diagnosis not present

## 2017-04-06 DIAGNOSIS — E663 Overweight: Secondary | ICD-10-CM | POA: Diagnosis not present

## 2017-04-06 DIAGNOSIS — M3500 Sicca syndrome, unspecified: Secondary | ICD-10-CM | POA: Diagnosis not present

## 2017-04-20 ENCOUNTER — Ambulatory Visit (INDEPENDENT_AMBULATORY_CARE_PROVIDER_SITE_OTHER)
Admission: RE | Admit: 2017-04-20 | Discharge: 2017-04-20 | Disposition: A | Discharge status: Home / Self Care | Payer: Medicare Other | Source: Ambulatory Visit | Attending: Family Medicine | Admitting: Family Medicine

## 2017-04-20 ENCOUNTER — Other Ambulatory Visit: Payer: Self-pay | Admitting: *Deleted

## 2017-04-20 DIAGNOSIS — R05 Cough: Secondary | ICD-10-CM

## 2017-04-20 DIAGNOSIS — R059 Cough, unspecified: Secondary | ICD-10-CM

## 2017-04-20 DIAGNOSIS — J189 Pneumonia, unspecified organism: Secondary | ICD-10-CM | POA: Diagnosis not present

## 2017-04-20 MED ORDER — LEVOTHYROXINE SODIUM 100 MCG PO TABS: 100.0000 ug | ORAL_TABLET | Freq: Every day | ORAL | 0 refills | Status: DC

## 2017-04-20 MED ORDER — AMLODIPINE BESYLATE 2.5 MG PO TABS: 2.5000 mg | ORAL_TABLET | Freq: Every day | ORAL | 0 refills | Status: DC

## 2017-05-20 ENCOUNTER — Encounter: Payer: Self-pay | Admitting: Family Medicine

## 2017-05-20 ENCOUNTER — Ambulatory Visit (INDEPENDENT_AMBULATORY_CARE_PROVIDER_SITE_OTHER): Payer: Medicare Other | Admitting: Family Medicine

## 2017-05-20 VITALS — BP 110/80 | HR 77 | Temp 98.5°F | Ht 64.0 in | Wt 167.5 lb

## 2017-05-20 DIAGNOSIS — E038 Other specified hypothyroidism: Secondary | ICD-10-CM

## 2017-05-20 DIAGNOSIS — I10 Essential (primary) hypertension: Secondary | ICD-10-CM | POA: Diagnosis not present

## 2017-05-20 DIAGNOSIS — K219 Gastro-esophageal reflux disease without esophagitis: Secondary | ICD-10-CM

## 2017-05-20 DIAGNOSIS — Z1159 Encounter for screening for other viral diseases: Secondary | ICD-10-CM | POA: Diagnosis not present

## 2017-05-20 DIAGNOSIS — K222 Esophageal obstruction: Secondary | ICD-10-CM

## 2017-05-20 LAB — BASIC METABOLIC PANEL
BUN: 15 mg/dL (ref 6–23)
CALCIUM: 9.5 mg/dL (ref 8.4–10.5)
CO2: 30 mEq/L (ref 19–32)
CREATININE: 0.9 mg/dL (ref 0.40–1.20)
Chloride: 104 mEq/L (ref 96–112)
GFR: 65.74 mL/min (ref >60.00)
Glucose, Bld: 97 mg/dL (ref 70–99)
Potassium: 4.4 mEq/L (ref 3.5–5.1)
SODIUM: 141 meq/L (ref 135–145)

## 2017-05-20 LAB — TSH: TSH: 1.11 u[IU]/mL (ref 0.35–4.50)

## 2017-05-20 NOTE — Progress Notes (Signed)
Subjective:     Patient ID: Christy Moore, female   DOB: Dec 04, 1945, 72 y.o.   MRN: [id number]  HPI Patient for medical follow-up. Her chronic problems include history of hypertension, hypothyroidism, GERD. She also had prior esophageal stricture and had dilatation she states about 5 years ago. She has history of Sjogren's syndrome. Current medications reviewed. Blood pressure controlled with low-dose amlodipine. No recent headaches or dizziness. She has occasional breakthrough GERD symptoms on pantoprazole. She's tried previously to transition to H2-blockers without success.  Hypothyroidism treated with levothyroxine. Compliant with therapy.  She plans to see GYN later this year for Pap smear. She is overdue for mammogram. Has not had bone density scan in several years. Hepatitis C antibody was ordered last year but she never went.  Past Medical History:  Diagnosis Date  . Cardiomegaly   . Hypothyroidism   . Sinus tachycardia    Past Surgical History:  Procedure Laterality Date  . HAND TENDON SURGERY  02/09/2007   tendon transfer-Dr. Amedeo Plenty  . NASAL SEPTUM SURGERY     operated on by Dr. Ernesto Rutherford 20 years ago  . OVARIAN CYST REMOVAL     Right Sided Salpingo-oopherectomy  . SALPINGOOPHORECTOMY      reports that she has never smoked. She has never used smokeless tobacco. She reports that she does not drink alcohol or use drugs. family history includes Alcohol abuse in her maternal grandfather and maternal grandmother; Allergies in her other; Cancer in her father and mother. Allergies  Allergen Reactions  . Amoxicillin-Pot Clavulanate     REACTION: nausea  . Azithromycin     REACTION: nausea, Gi  . Celecoxib     REACTION: nausea  . Ciprofloxacin     REACTION: nausea  . Doxycycline     REACTION: gi upset  . Erythromycin     REACTION: nause, GI  . Hydrocodone     REACTION: nausea  . Hydrocodone-Acetaminophen     REACTION: GI upset  . Levofloxacin     REACTION: she claims  renal failiure  . Propoxyphene N-Acetaminophen   . Rofecoxib     REACTION: nausea  . Tetanus Toxoid      Review of Systems  Constitutional: Negative for fatigue.  Eyes: Negative for visual disturbance.  Respiratory: Negative for cough, chest tightness, shortness of breath and wheezing.   Cardiovascular: Negative for chest pain, palpitations and leg swelling.  Gastrointestinal: Negative for abdominal pain.  Endocrine: Negative for polydipsia and polyuria.  Neurological: Negative for dizziness, seizures, syncope, weakness, light-headedness and headaches.       Objective:   Physical Exam  Constitutional: She appears well-developed and well-nourished.  Eyes: Pupils are equal, round, and reactive to light.  Neck: Neck supple. No JVD present. No thyromegaly present.  Cardiovascular: Normal rate and regular rhythm. Exam reveals no gallop.  Pulmonary/Chest: Effort normal and breath sounds normal. No respiratory distress. She has no wheezes. She has no rales.  Musculoskeletal: She exhibits no edema.  Neurological: She is alert.       Assessment:     #1 hypertension stable and at goal  #2 hypothyroidism- aymptomatic  #3 history of GERD currently stable on pantoprazole    Plan:     -Check labs with TSH, basic metabolic panel, hepatitis C antibody -Patient plans to set of GYN follow-up for Pap smear and mammogram. -We recommend she consider DEXA scan and she'll get back with [place] if she decides to pursue that -Continue regular weightbearing exercise. -She'll need repeat colonoscopy in  a couple years  Eulas Post MD [facility] at [facility]

## 2017-05-20 NOTE — Patient Instructions (Signed)
Set up repeat mammogram  We need to consider DEXA scan this year.

## 2017-05-21 LAB — HEPATITIS C ANTIBODY
HEP C AB: NONREACTIVE
SIGNAL TO CUT-OFF: 0.2 (ref <1.00)

## 2017-05-26 ENCOUNTER — Ambulatory Visit: Payer: Medicare Other

## 2017-05-26 NOTE — Progress Notes (Addendum)
Subjective:   Christy Moore is a 72 y.o. female who presents for Medicare Annual (Subsequent) preventive examination.  Reports health as OV 05/2017  Spouse is depressed    Diet BMI 28  Has a huge garden  Eat 2 meals a day;  Breakfast; 2 cups of coffee with milk Sometimes sandwich Tries to cook healthy Freezes jellies and pickles Dehydrator   Regular exercise Gardening- full time; at least 2 hours a day Has had issues with right shoulder  Power washing the deck Now painting it   Eye exam 01/2015 repeats every year Dr. Herbert Deaner; no issues to date  Meds- changed to [facility] Maintenance Due  Topic Date Due  . MAMMOGRAM  02/17/1997  . DEXA SCAN  02/18/2012   See GYN Dr. Helane Rima  DEXA: - had one 2002 - T score -1.2  States she is not going to take the  Medicine and declines dexa today Given information and referred to the osteoporosis site for more infomation  Educated on newer meds and to schedule in the future  She has had no fx  Hx of breaking wrist   Educated regarding shingrix       Objective:     Vitals: BP 130/70   Pulse 75   Ht 5\' 4"  (1.626 m)   Wt 167 lb (75.8 kg)   SpO2 97%   BMI 28.67 kg/m   Body mass index is 28.67 kg/m.  Advanced Directives 05/23/2016  Does Patient Have a Medical Advance Directive? No  states her spouse has completed but she has not; Agrees to taking [place] form today and reviewed with her  On hold this year   Tobacco Social History   Tobacco Use  Smoking Status Never Smoker  Smokeless Tobacco Never Used     Counseling given: Yes   Clinical Intake:     Past Medical History:  Diagnosis Date  . Cardiomegaly   . Hypothyroidism   . Sinus tachycardia    Past Surgical History:  Procedure Laterality Date  . HAND TENDON SURGERY  02/09/2007   tendon transfer-Dr. Amedeo Plenty  . NASAL SEPTUM SURGERY     operated on by Dr. Ernesto Rutherford 20 years ago  . OVARIAN CYST REMOVAL     Right Sided Salpingo-oopherectomy  .  SALPINGOOPHORECTOMY     Family History  Problem Relation Age of Onset  . Allergies Other        Grandmother-pt states she takes after grandmother who had multiple allergies  . Cancer Mother        breast and lung  . Cancer Father        prostate  . Alcohol abuse Maternal Grandmother   . Alcohol abuse Maternal Grandfather    Social History   Socioeconomic History  . Marital status: Married    Spouse name: Not on file  . Number of children: Not on file  . Years of education: Not on file  . Highest education level: Not on file  Occupational History  . Not on file  Social Needs  . Financial resource strain: Not on file  . Food insecurity:    Worry: Not on file    Inability: Not on file  . Transportation needs:    Medical: Not on file    Non-medical: Not on file  Tobacco Use  . Smoking status: Never Smoker  . Smokeless tobacco: Never Used  Substance and Sexual Activity  . Alcohol use: No  . Drug use: No  .  Sexual activity: Not on file  Lifestyle  . Physical activity:    Days per week: Not on file    Minutes per session: Not on file  . Stress: Not on file  Relationships  . Social connections:    Talks on phone: Not on file    Gets together: Not on file    Attends religious service: Not on file    Active member of club or organization: Not on file    Attends meetings of clubs or organizations: Not on file    Relationship status: Not on file  Other Topics Concern  . Not on file  Social History Narrative  . Not on file    Outpatient Encounter Medications as of 05/27/2017  Medication Sig  . amLODipine (NORVASC) 2.5 MG tablet Take 1 tablet (2.5 mg total) by mouth daily.  . fluticasone (FLONASE) 50 MCG/ACT nasal spray Place into both nostrils daily.  Marland Kitchen levothyroxine (SYNTHROID, LEVOTHROID) 100 MCG tablet Take 1 tablet (100 mcg total) by mouth daily.  . pantoprazole (PROTONIX) 40 MG tablet TAKE 1 TABLET BY MOUTH EVERY DAY  . [DISCONTINUED] metoprolol succinate  (TOPROL-XL) 25 MG 24 hr tablet 1/2 tablet twice a day  . [DISCONTINUED] mupirocin (BACTROBAN) 2 % nasal ointment by Nasal route 2 (two) times daily. Use one-half of tube in each nostril twice daily for five (5) days. After application, press sides of nose together and gently massage.   . [DISCONTINUED] topiramate (TOPAMAX) 25 MG tablet Take 25 mg by mouth at bedtime.     No facility-administered encounter medications on file as of 05/27/2017.     Activities of Daily Living No flowsheet data found.  Patient Care Team: Eulas Post, MD as PCP - General (Family Medicine)    Assessment:   This is a routine wellness examination for Christy Moore.  Exercise Activities and Dietary recommendations    Goals    . Weight (lb) < 140 lb (63.5 kg)     Will rotate eating habits More for breakfast and lunch and less of dinner   Health snacks; scrambled egg sandwich; bowl of cereal  Scotch eggs   Start walking more around the neighborhood         Fall Risk Fall Risk  05/23/2016 03/24/2016 02/19/2015 02/19/2015 12/05/2013  Falls in the past year? No No No No No     Depression Screen PHQ 2/9 Scores 05/23/2016 03/24/2016 02/19/2015 02/19/2015  PHQ - 2 Score 1 1 0 0     Cognitive Function   Ad8 score reviewed for issues:  Issues making decisions:  Less interest in hobbies / activities:  Repeats questions, stories (family complaining):  Trouble using ordinary gadgets (microwave, computer, phone):  Forgets the month or year:   Mismanaging finances:   Remembering appts:  Daily problems with thinking and/or memory: Ad8 score is=0          Immunization History  Administered Date(s) Administered  . Influenza Split 11/22/2011  . Influenza, High Dose Seasonal PF 09/14/2014, 09/07/2015, 10/07/2016  . Influenza,inj,Quad PF,6+ Mos 10/05/2013  . Pneumococcal Conjugate-13 10/05/2013  . Pneumococcal Polysaccharide-23 02/19/2015  . Zoster 01/17/2010      Screening Tests Health  Maintenance  Topic Date Due  . MAMMOGRAM  02/17/1997  . DEXA SCAN  02/18/2012  . TETANUS/TDAP  02/19/2035 (Originally 02/17/1964)  . INFLUENZA VACCINE  08/06/2017  . COLONOSCOPY  02/22/2019  . Hepatitis C Screening  Completed  . PNA vac Low Risk Adult  Completed  Plan:       PCP Notes   Health Maintenance Will have dexa and bone density with GYN and to schedule an apt; Stated GYN will follow her mamogram and dexa   Educated regarding shingrix   Abnormal Screens  Phq 7  Referred to [facility] for counseling  Will make an apt with Dr. Elease Hashimoto to discuss medication as needed. Has been working in her garden and staying engaged. 52th anniversary this Friday.  Discussed multiple issues and referred to counseling of which she agreed.    Referrals  Would like a different RH MD  Raynaud's and shogrens  Can discuss with Dr. Elease Hashimoto   Patient concerns; White noise in right ear over the last couple of months Any recommendations. Has seen Dr. Ernesto Rutherford and audiologist there Recommended an apt to fup   Nurse Concerns; As noted;  Will need fup to note her visit with Dr. Karle Plumber to fup with her in 30 days  Still active and engaged in church etc,  Agreed to fup with counselor at this [facility]   Next PCP apt TBS     I have personally reviewed and noted the following in the patient's chart:   . Medical and social history . Use of alcohol, tobacco or illicit drugs  . Current medications and supplements . Functional ability and status . Nutritional status . Physical activity . Advanced directives . List of other physicians . Hospitalizations, surgeries, and ER visits in previous 12 months . Vitals . Screenings to include cognitive, depression, and falls . Referrals and appointments  In addition, I have reviewed and discussed with patient certain preventive protocols, quality metrics, and best practice recommendations. A written personalized care plan for  preventive services as well as general preventive health recommendations were provided to patient.     VZCHY,IFOYD, RN  05/27/2017  I have reviewed the documentation for the [facility] and [place] provided by the health coach and agree with their documentation. I was immediately available for any questions   Agree with referral for further counseling.  Eulas Post MD  Primary [facility] at [facility]

## 2017-05-27 ENCOUNTER — Telehealth: Payer: Self-pay

## 2017-05-27 ENCOUNTER — Ambulatory Visit (INDEPENDENT_AMBULATORY_CARE_PROVIDER_SITE_OTHER): Payer: Medicare Other

## 2017-05-27 VITALS — BP 130/70 | HR 75 | Ht 64.0 in | Wt 167.0 lb

## 2017-05-27 DIAGNOSIS — Z0000 Encounter for general adult medical examination without abnormal findings: Secondary | ICD-10-CM

## 2017-05-27 NOTE — Telephone Encounter (Signed)
Call to fup on depression and visit with Genevive Bi  Will need apt with Dr. Elease Hashimoto if she is not feeling better.

## 2017-05-27 NOTE — Patient Instructions (Addendum)
Christy Moore , Thank you for taking time to come for your Medicare Wellness Visit. I appreciate your ongoing commitment to your health goals. Please review the following plan we discussed and let me know if I can assist you in the future.   Will [facility]  Plans to repeat dexa but Dr. Helane Rima will order; may have the GI breast center call her for an order   Shingrix is a vaccine for the prevention of Shingles in Adults 70 and older.  If you are on Medicare, the shingrix is covered under your Part D plan, so you will take both of the vaccines in the series at your pharmacy. Please check with your benefits regarding applicable copays or out of pocket expenses.  The Shingrix is given in 2 vaccines approx 8 weeks apart. You must receive the 2nd dose prior to 6 months from receipt of the first. Please have the pharmacist print out you Immunization  dates for our office records   May make an apt with Dr. Ernesto Rutherford to fup on white noise   Please fup with Tarri Abernethy as we discussed     These are the goals we discussed: Goals    . Patient Stated     Celebrate 52th Anniversary!  Continue in your garden!    . Weight (lb) < 140 lb (63.5 kg)     Will rotate eating habits More for breakfast and lunch and less of dinner   Health snacks; scrambled egg sandwich; bowl of cereal  Scotch eggs   Start walking more around the neighborhood         This is a list of the screening recommended for you and due dates:  Health Maintenance  Topic Date Due  . Mammogram  02/17/1997  . DEXA scan (bone density measurement)  02/18/2012  . Tetanus Vaccine  02/19/2035*  . Flu Shot  08/06/2017  . Colon Cancer Screening  02/22/2019  .  Hepatitis C: One time screening is recommended by Center for Disease Control  (CDC) for  adults born from [birth year] through 1963.   Completed  . Pneumonia vaccines  Completed  *Topic was postponed. The date shown is not the original due date.     Prevention of falls: Remove rugs or any tripping hazards in the home Use Non slip mats in bathtubs and showers Placing grab bars next to the toilet and or shower Placing handrails on both sides of the stair way Adding extra lighting in the home.   Personal safety issues reviewed:  1. Consider starting a community watch program per [facility] 2.  Changes batteries is smoke detector and/or carbon monoxide detector  3.  If you have firearms; keep them in a safe place 4.  Wear protection when in the sun; Always wear sunscreen or a hat; It is good to have your doctor check your skin annually or review any new areas of concern 5. Driving safety; Keep in the right lane; stay 3 car lengths behind the car in front of you on the highway; look 3 times prior to pulling out; carry your cell phone everywhere you go!     Fall Prevention in the Home Falls can cause injuries. They can happen to people of all ages. There are many things you can do to make your home safe and to help prevent falls. What can I do on the outside of my home?  Regularly fix the edges of walkways and driveways  and fix any cracks.  Remove anything that might make you trip as you walk through a door, such as a raised step or threshold.  Trim any bushes or trees on the path to your home.  Use bright outdoor lighting.  Clear any walking paths of anything that might make someone trip, such as rocks or tools.  Regularly check to see if handrails are loose or broken. Make sure that both sides of any steps have handrails.  Any raised decks and porches should have guardrails on the edges.  Have any leaves, snow, or ice cleared regularly.  Use sand or salt on walking paths during winter.  Clean up any spills in your garage right away. This includes oil or grease spills. What can I do in the bathroom?  Use night lights.  Install grab bars by the toilet and in the tub and shower. Do not use towel bars as grab  bars.  Use non-skid mats or decals in the tub or shower.  If you need to sit down in the shower, use a plastic, non-slip stool.  Keep the floor dry. Clean up any water that spills on the floor as soon as it happens.  Remove soap buildup in the tub or shower regularly.  Attach bath mats securely with double-sided non-slip rug tape.  Do not have throw rugs and other things on the floor that can make you trip. What can I do in the bedroom?  Use night lights.  Make sure that you have a light by your bed that is easy to reach.  Do not use any sheets or blankets that are too big for your bed. They should not hang down onto the floor.  Have a firm chair that has side arms. You can use this for support while you get dressed.  Do not have throw rugs and other things on the floor that can make you trip. What can I do in the kitchen?  Clean up any spills right away.  Avoid walking on wet floors.  Keep items that you use a lot in easy-to-reach places.  If you need to reach something above you, use a strong step stool that has a grab bar.  Keep electrical cords out of the way.  Do not use floor polish or wax that makes floors slippery. If you must use wax, use non-skid floor wax.  Do not have throw rugs and other things on the floor that can make you trip. What can I do with my stairs?  Do not leave any items on the stairs.  Make sure that there are handrails on both sides of the stairs and use them. Fix handrails that are broken or loose. Make sure that handrails are as long as the stairways.  Check any carpeting to make sure that it is firmly attached to the stairs. Fix any carpet that is loose or worn.  Avoid having throw rugs at the top or bottom of the stairs. If you do have throw rugs, attach them to the floor with carpet tape.  Make sure that you have a light switch at the top of the stairs and the bottom of the stairs. If you do not have them, ask someone to add them for  you. What else can I do to help prevent falls?  Wear shoes that: ? Do not have high heels. ? Have rubber bottoms. ? Are comfortable and fit you well. ? Are closed at the toe. Do not wear sandals.  If  you use a stepladder: ? Make sure that it is fully opened. Do not climb a closed stepladder. ? Make sure that both sides of the stepladder are locked into place. ? Ask someone to hold it for you, if possible.  Clearly mark and make sure that you can see: ? Any grab bars or handrails. ? First and last steps. ? Where the edge of each step is.  Use tools that help you move around (mobility aids) if they are needed. These include: ? Canes. ? Walkers. ? Scooters. ? Crutches.  Turn on the lights when you go into a dark area. Replace any light bulbs as soon as they burn out.  Set up your furniture so you have a clear path. Avoid moving your furniture around.  If any of your floors are uneven, fix them.  If there are any pets around you, be aware of where they are.  Review your medicines with your doctor. Some medicines can make you feel dizzy. This can increase your chance of falling. Ask your doctor what other things that you can do to help prevent falls. This information is not intended to replace advice given to you by your health care provider. Make sure you discuss any questions you have with your health care provider. Document Released: 10/19/2008 Document Revised: 05/31/2015 Document Reviewed: 01/27/2014 Elsevier Interactive Patient Education  2018 [place] Maintenance, Female Adopting a healthy lifestyle and getting preventive care can go a long way to promote health and wellness. Talk with your health care provider about what schedule of regular examinations is right for you. This is a good chance for you to check in with your provider about disease prevention and staying healthy. In between checkups, there are plenty of things you can do on your own. Experts have  done a lot of research about which lifestyle changes and preventive measures are most likely to keep you healthy. Ask your health care provider for more information. Weight and diet Eat a healthy diet  Be sure to include plenty of vegetables, fruits, low-fat dairy products, and lean protein.  Do not eat a lot of foods high in solid fats, added sugars, or salt.  Get regular exercise. This is one of the most important things you can do for your health. ? Most adults should exercise for at least 150 minutes each week. The exercise should increase your heart rate and make you sweat (moderate-intensity exercise). ? Most adults should also do strengthening exercises at least twice a week. This is in addition to the moderate-intensity exercise.  Maintain a healthy weight  Body mass index (BMI) is a measurement that can be used to identify possible weight problems. It estimates body fat based on height and weight. Your health care provider can help determine your BMI and help you achieve or maintain a healthy weight.  For females 32 years of age and older: ? A BMI below 18.5 is considered underweight. ? A BMI of 18.5 to 24.9 is normal. ? A BMI of 25 to 29.9 is considered overweight. ? A BMI of 30 and above is considered obese.  Watch levels of cholesterol and blood lipids  You should start having your blood tested for lipids and cholesterol at 70 years of age, then have this test every 5 years.  You may need to have your cholesterol levels checked more often if: ? Your lipid or cholesterol levels are high. ? You are older than 70 years of age. ? You are  at high risk for heart disease.  Cancer screening Lung Cancer  Lung cancer screening is recommended for adults 69-78 years old who are at high risk for lung cancer because of a history of smoking.  A yearly low-dose CT scan of the lungs is recommended for people who: ? Currently smoke. ? Have quit within the past 15 years. ? Have at  least a 30-pack-year history of smoking. A pack year is smoking an average of one pack of cigarettes a day for 1 year.  Yearly screening should continue until it has been 15 years since you quit.  Yearly screening should stop if you develop a health problem that would prevent you from having lung cancer treatment.  Breast Cancer  Practice breast self-awareness. This means understanding how your breasts normally appear and feel.  It also means doing regular breast self-exams. Let your health care provider know about any changes, no matter how small.  If you are in your 20s or 30s, you should have a clinical breast exam (CBE) by a health care provider every 1-3 years as part of a regular health exam.  If you are 34 or older, have a CBE every year. Also consider having a breast X-ray (mammogram) every year.  If you have a family history of breast cancer, talk to your health care provider about genetic screening.  If you are at high risk for breast cancer, talk to your health care provider about having an MRI and a mammogram every year.  Breast cancer gene (BRCA) assessment is recommended for women who have family members with BRCA-related cancers. BRCA-related cancers include: ? Breast. ? Ovarian. ? Tubal. ? Peritoneal cancers.  Results of the assessment will determine the need for genetic counseling and BRCA1 and BRCA2 testing.  Cervical Cancer Your health care provider may recommend that you be screened regularly for cancer of the pelvic organs (ovaries, uterus, and vagina). This screening involves a pelvic examination, including checking for microscopic changes to the surface of your cervix (Pap test). You may be encouraged to have this screening done every 3 years, beginning at age 10.  For women ages 52-65, health care providers may recommend pelvic exams and Pap testing every 3 years, or they may recommend the Pap and pelvic exam, combined with testing for human papilloma virus  (HPV), every 5 years. Some types of HPV increase your risk of cervical cancer. Testing for HPV may also be done on women of any age with unclear Pap test results.  Other health care providers may not recommend any screening for nonpregnant women who are considered low risk for pelvic cancer and who do not have symptoms. Ask your health care provider if a screening pelvic exam is right for you.  If you have had past treatment for cervical cancer or a condition that could lead to cancer, you need Pap tests and screening for cancer for at least 20 years after your treatment. If Pap tests have been discontinued, your risk factors (such as having a new sexual partner) need to be reassessed to determine if screening should resume. Some women have medical problems that increase the chance of getting cervical cancer. In these cases, your health care provider may recommend more frequent screening and Pap tests.  Colorectal Cancer  This type of cancer can be detected and often prevented.  Routine colorectal cancer screening usually begins at 70 years of age and continues through 70 years of age.  Your health care provider may recommend screening at an  earlier age if you have risk factors for colon cancer.  Your health care provider may also recommend using home test kits to check for hidden blood in the stool.  A small camera at the end of a tube can be used to examine your colon directly (sigmoidoscopy or colonoscopy). This is done to check for the earliest forms of colorectal cancer.  Routine screening usually begins at age 63.  Direct examination of the colon should be repeated every 5-10 years through 70 years of age. However, you may need to be screened more often if early forms of precancerous polyps or small growths are found.  Skin Cancer  Check your skin from head to toe regularly.  Tell your health care provider about any new moles or changes in moles, especially if there is a change in a  mole's shape or color.  Also tell your health care provider if you have a mole that is larger than the size of a pencil eraser.  Always use sunscreen. Apply sunscreen liberally and repeatedly throughout the day.  Protect yourself by wearing long sleeves, pants, a wide-brimmed hat, and sunglasses whenever you are outside.  Heart disease, diabetes, and high blood pressure  High blood pressure causes heart disease and increases the risk of stroke. High blood pressure is more likely to develop in: ? People who have blood pressure in the high end of the normal range (130-139/85-89 mm Hg). ? People who are overweight or obese. ? People who are African American.  If you are 4-75 years of age, have your blood pressure checked every 3-5 years. If you are 49 years of age or older, have your blood pressure checked every year. You should have your blood pressure measured twice-once when you are at a hospital or clinic, and once when you are not at a hospital or clinic. Record the average of the two measurements. To check your blood pressure when you are not at a hospital or clinic, you can use: ? An automated blood pressure machine at a pharmacy. ? A home blood pressure monitor.  If you are between 26 years and 72 years old, ask your health care provider if you should take aspirin to prevent strokes.  Have regular diabetes screenings. This involves taking a blood sample to check your fasting blood sugar level. ? If you are at a normal weight and have a low risk for diabetes, have this test once every three years after 70 years of age. ? If you are overweight and have a high risk for diabetes, consider being tested at a younger age or more often. Preventing infection Hepatitis B  If you have a higher risk for hepatitis B, you should be screened for this virus. You are considered at high risk for hepatitis B if: ? You were born in a country where hepatitis B is common. Ask your health care provider  which countries are considered high risk. ? Your parents were born in a high-risk country, and you have not been immunized against hepatitis B (hepatitis B vaccine). ? You have HIV or AIDS. ? You use needles to inject street drugs. ? You live with someone who has hepatitis B. ? You have had sex with someone who has hepatitis B. ? You get hemodialysis treatment. ? You take certain medicines for conditions, including cancer, organ transplantation, and autoimmune conditions.  Hepatitis C  Blood testing is recommended for: ? Everyone born from [birth year] through 1963. ? Anyone with known risk factors for hepatitis  C.  Sexually transmitted infections (STIs)  You should be screened for sexually transmitted infections (STIs) including gonorrhea and chlamydia if: ? You are sexually active and are younger than 70 years of age. ? You are older than 70 years of age and your health care provider tells you that you are at risk for this type of infection. ? Your sexual activity has changed since you were last screened and you are at an increased risk for chlamydia or gonorrhea. Ask your health care provider if you are at risk.  If you do not have HIV, but are at risk, it may be recommended that you take a prescription medicine daily to prevent HIV infection. This is called pre-exposure prophylaxis (PrEP). You are considered at risk if: ? You are sexually active and do not regularly use condoms or know the HIV status of your partner(s). ? You take drugs by injection. ? You are sexually active with a partner who has HIV.  Talk with your health care provider about whether you are at high risk of being infected with HIV. If you choose to begin PrEP, you should first be tested for HIV. You should then be tested every 3 months for as long as you are taking PrEP. Pregnancy  If you are premenopausal and you may become pregnant, ask your health care provider about preconception counseling.  If you may become  pregnant, take 400 to 800 micrograms (mcg) of folic acid every day.  If you want to prevent pregnancy, talk to your health care provider about birth control (contraception). Osteoporosis and menopause  Osteoporosis is a disease in which the bones lose minerals and strength with aging. This can result in serious bone fractures. Your risk for osteoporosis can be identified using a bone density scan.  If you are 79 years of age or older, or if you are at risk for osteoporosis and fractures, ask your health care provider if you should be screened.  Ask your health care provider whether you should take a calcium or vitamin D supplement to lower your risk for osteoporosis.  Menopause may have certain physical symptoms and risks.  Hormone replacement therapy may reduce some of these symptoms and risks. Talk to your health care provider about whether hormone replacement therapy is right for you. Follow these instructions at home:  Schedule regular health, dental, and eye exams.  Stay current with your immunizations.  Do not use any tobacco products including cigarettes, chewing tobacco, or electronic cigarettes.  If you are pregnant, do not drink alcohol.  If you are breastfeeding, limit how much and how often you drink alcohol.  Limit alcohol intake to no more than 1 drink per day for nonpregnant women. One drink equals 12 ounces of beer, 5 ounces of wine, or 1 ounces of hard liquor.  Do not use street drugs.  Do not share needles.  Ask your health care provider for help if you need support or information about quitting drugs.  Tell your health care provider if you often feel depressed.  Tell your health care provider if you have ever been abused or do not feel safe at home. This information is not intended to replace advice given to you by your health care provider. Make sure you discuss any questions you have with your health care provider. Document Released: 07/08/2010 Document  Revised: 05/31/2015 Document Reviewed: 09/26/2014 Elsevier Interactive Patient Education  [employer].

## 2017-06-04 NOTE — Telephone Encounter (Signed)
Call to Ms. Zavadil to be sure she was able to Reach Ms. Margart Sickles for counseling. LVM to call back on my confidential line. [phone] or I would try to outreach her again.  Wynetta Fines RN

## 2017-06-24 ENCOUNTER — Telehealth: Payer: Self-pay

## 2017-06-24 NOTE — Telephone Encounter (Signed)
2nd outreach to confirm Ms. Stump did meet with a counselor Edwena Blow  Was unable to reach her. Wynetta Fines RN

## 2017-07-16 ENCOUNTER — Other Ambulatory Visit: Payer: Self-pay | Admitting: Family Medicine

## 2017-08-10 DIAGNOSIS — M25562 Pain in left knee: Secondary | ICD-10-CM | POA: Diagnosis not present

## 2017-08-17 ENCOUNTER — Ambulatory Visit (INDEPENDENT_AMBULATORY_CARE_PROVIDER_SITE_OTHER): Payer: Medicare Other | Admitting: Family Medicine

## 2017-08-17 VITALS — BP 128/74 | HR 98 | Temp 98.4°F | Wt 166.2 lb

## 2017-08-17 DIAGNOSIS — L03031 Cellulitis of right toe: Secondary | ICD-10-CM | POA: Diagnosis not present

## 2017-08-17 MED ORDER — CEPHALEXIN 500 MG PO CAPS: 500.0000 mg | ORAL_CAPSULE | Freq: Four times a day (QID) | ORAL | 0 refills | Status: DC

## 2017-08-17 NOTE — Progress Notes (Signed)
  Subjective:     Patient ID: Christy Moore, female   DOB: 1945-03-29, 72 y.o.   MRN: [id number]  HPI Patient seen with pain and swelling right fifth toe. She had a pedicure on Friday. By Saturday she had some soreness by last night she noticed some redness and warmth. She also had some chills but no documented fever. She is still able ambulate. No nausea or vomiting.  She has multiple listed drug "allergies". These are really more drug intolerances mostly involve nausea or GI upset. She has taken Keflex before. Check she's had some mild irritation with that but not the extent that she did with Augmentin or doxycycline or other medications  Past Medical History:  Diagnosis Date  . Cardiomegaly   . Hypothyroidism   . Sinus tachycardia    Past Surgical History:  Procedure Laterality Date  . HAND TENDON SURGERY  02/09/2007   tendon transfer-Dr. Amedeo Plenty  . NASAL SEPTUM SURGERY     operated on by Dr. Ernesto Rutherford 20 years ago  . OVARIAN CYST REMOVAL     Right Sided Salpingo-oopherectomy  . SALPINGOOPHORECTOMY      reports that she has never smoked. She has never used smokeless tobacco. She reports that she does not drink alcohol or use drugs. family history includes Alcohol abuse in her maternal grandfather and maternal grandmother; Allergies in her other; Cancer in her father and mother. Allergies  Allergen Reactions  . Amoxicillin-Pot Clavulanate     REACTION: nausea  . Azithromycin     REACTION: nausea, Gi  . Celecoxib     REACTION: nausea  . Ciprofloxacin     REACTION: nausea  . Doxycycline     REACTION: gi upset  . Erythromycin     REACTION: nause, GI  . Hydrocodone     REACTION: nausea  . Hydrocodone-Acetaminophen     REACTION: GI upset  . Levofloxacin     REACTION: she claims renal failiure  . Propoxyphene N-Acetaminophen   . Rofecoxib     REACTION: nausea  . Tetanus Toxoid      Review of Systems  Constitutional: Negative for chills and fever.       Objective:    Physical Exam  Constitutional: She appears well-developed and well-nourished.  Cardiovascular: Normal rate and regular rhythm.  Pulmonary/Chest: Effort normal and breath sounds normal.  Skin:  Pt has erythema, mild swelling, and warmth involving right fifth toe.  Tender to palpation. No skin breakdown. No ulceration. Nail is fully intact. No visible purulence. No fluctuance. No foot involvement       Assessment:     Cellulitis involving right fifth toe following recent pedicure    Plan:     -Elevate foot frequently -Consider warm soaks or heat on low -Keflex 500 mg 4 times a day for 10 days -Reassess in 4 days and sooner as needed  Christy Post MD [facility]

## 2017-08-17 NOTE — Patient Instructions (Signed)

## 2017-08-21 ENCOUNTER — Ambulatory Visit (INDEPENDENT_AMBULATORY_CARE_PROVIDER_SITE_OTHER): Payer: Medicare Other | Admitting: Family Medicine

## 2017-08-21 ENCOUNTER — Encounter: Payer: Self-pay | Admitting: Family Medicine

## 2017-08-21 VITALS — BP 130/80 | HR 80 | Temp 98.5°F | Wt 169.6 lb

## 2017-08-21 DIAGNOSIS — L03031 Cellulitis of right toe: Secondary | ICD-10-CM

## 2017-08-21 NOTE — Patient Instructions (Signed)
Remember to get high dose flu vaccine this Fall.

## 2017-08-21 NOTE — Progress Notes (Signed)
  Subjective:     Patient ID: Christy Moore, female   DOB: September 28, 1945, 72 y.o.   MRN: [id number]  HPI Here for follow-up cellulitis right great toe. No fever. No chills. Erythema improving. She has intolerance to multiple antibiotics but has been able to take Keflex. Only some mild abdominal cramping. No vomiting. No diarrhea.  Past Medical History:  Diagnosis Date  . Cardiomegaly   . Hypothyroidism   . Sinus tachycardia    Past Surgical History:  Procedure Laterality Date  . HAND TENDON SURGERY  02/09/2007   tendon transfer-Dr. Amedeo Plenty  . NASAL SEPTUM SURGERY     operated on by Dr. Ernesto Rutherford 20 years ago  . OVARIAN CYST REMOVAL     Right Sided Salpingo-oopherectomy  . SALPINGOOPHORECTOMY      reports that she has never smoked. She has never used smokeless tobacco. She reports that she does not drink alcohol or use drugs. family history includes Alcohol abuse in her maternal grandfather and maternal grandmother; Allergies in her other; Cancer in her father and mother. Allergies  Allergen Reactions  . Amoxicillin-Pot Clavulanate     REACTION: nausea  . Azithromycin     REACTION: nausea, Gi  . Celecoxib     REACTION: nausea  . Ciprofloxacin     REACTION: nausea  . Doxycycline     REACTION: gi upset  . Erythromycin     REACTION: nause, GI  . Hydrocodone     REACTION: nausea  . Hydrocodone-Acetaminophen     REACTION: GI upset  . Levofloxacin     REACTION: she claims renal failiure  . Propoxyphene N-Acetaminophen   . Rofecoxib     REACTION: nausea  . Tetanus Toxoid      Review of Systems  Constitutional: Negative for chills and fever.       Objective:   Physical Exam  Constitutional: She appears well-developed and well-nourished.  Cardiovascular: Normal rate and regular rhythm.  Skin:  Right fifth toe reveals much less erythema. Previously noted erythema extending to the base of the toe has resolved. No ulcerations. No fluctuance. No warmth. Minimally tender.        Assessment:     Cellulitis right fifth toe improving on Keflex    Plan:     -Finish out Keflex -Continue warm saltwater soaks -Follow-up for any recurrent erythema or other concerns  Eulas Post MD [facility] at [facility]

## 2017-08-26 ENCOUNTER — Other Ambulatory Visit: Payer: Self-pay | Admitting: Family Medicine

## 2017-09-01 ENCOUNTER — Ambulatory Visit (INDEPENDENT_AMBULATORY_CARE_PROVIDER_SITE_OTHER): Payer: Medicare Other | Admitting: Family Medicine

## 2017-09-01 ENCOUNTER — Encounter: Payer: Self-pay | Admitting: Family Medicine

## 2017-09-01 VITALS — BP 120/80 | HR 86 | Temp 98.4°F | Wt 169.8 lb

## 2017-09-01 DIAGNOSIS — L03031 Cellulitis of right toe: Secondary | ICD-10-CM

## 2017-09-01 NOTE — Patient Instructions (Signed)
Keep clean with soap and water  Follow up for any increased heat, redness, or pain.

## 2017-09-01 NOTE — Progress Notes (Signed)
  Subjective:     Patient ID: Christy Moore, female   DOB: 03-04-1945, 72 y.o.   MRN: [id number]  HPI Patient seen for follow-up regarding cellulitis right fifth toe. Symptomatically improved. Her concern is that she has some skin peeling off from the toe. She's not had any warmth. No fevers or chills. No pain with ambulation. Some discoloration of toenail.She had to reduce her Keflex to 3 times a day from 4 times a day secondary to intolerance.  Past Medical History:  Diagnosis Date  . Cardiomegaly   . Hypothyroidism   . Sinus tachycardia    Past Surgical History:  Procedure Laterality Date  . HAND TENDON SURGERY  02/09/2007   tendon transfer-Dr. Amedeo Plenty  . NASAL SEPTUM SURGERY     operated on by Dr. Ernesto Rutherford 20 years ago  . OVARIAN CYST REMOVAL     Right Sided Salpingo-oopherectomy  . SALPINGOOPHORECTOMY      reports that she has never smoked. She has never used smokeless tobacco. She reports that she does not drink alcohol or use drugs. family history includes Alcohol abuse in her maternal grandfather and maternal grandmother; Allergies in her other; Cancer in her father and mother. Allergies  Allergen Reactions  . Amoxicillin-Pot Clavulanate     REACTION: nausea  . Azithromycin     REACTION: nausea, Gi  . Celecoxib     REACTION: nausea  . Ciprofloxacin     REACTION: nausea  . Doxycycline     REACTION: gi upset  . Erythromycin     REACTION: nause, GI  . Hydrocodone     REACTION: nausea  . Hydrocodone-Acetaminophen     REACTION: GI upset  . Levofloxacin     REACTION: she claims renal failiure  . Propoxyphene N-Acetaminophen   . Rofecoxib     REACTION: nausea  . Tetanus Toxoid      Review of Systems  Constitutional: Negative for chills and fever.       Objective:   Physical Exam  Constitutional: She appears well-developed and well-nourished.  Cardiovascular: Normal rate and regular rhythm.  Skin:  Right fifth toe reveals some mild erythema distally but no  warmth. No tenderness. She has some skin peeling off the ventral and dorsal aspect. No necrosis. Good distal pulses. Excellent capillary refill. Toenail is slightly loose but attached near the base       Assessment:     Resolving cellulitis right fifth toe. She has some desquamation of epidermis on the surface but no necrosis or other worrisome features    Plan:     -follow-up promptly for any warmth, increased redness, pain, or any fever -We explained that she could potentially loose the toenail -follow-up as needed  Eulas Post MD [facility] at [facility]

## 2017-09-02 DIAGNOSIS — M1712 Unilateral primary osteoarthritis, left knee: Secondary | ICD-10-CM | POA: Diagnosis not present

## 2017-09-02 DIAGNOSIS — M238X2 Other internal derangements of left knee: Secondary | ICD-10-CM | POA: Diagnosis not present

## 2017-09-11 DIAGNOSIS — M25562 Pain in left knee: Secondary | ICD-10-CM | POA: Diagnosis not present

## 2017-09-21 DIAGNOSIS — M1712 Unilateral primary osteoarthritis, left knee: Secondary | ICD-10-CM | POA: Diagnosis not present

## 2017-09-21 DIAGNOSIS — S83242D Other tear of medial meniscus, current injury, left knee, subsequent encounter: Secondary | ICD-10-CM | POA: Diagnosis not present

## 2017-10-06 DIAGNOSIS — I7300 Raynaud's syndrome without gangrene: Secondary | ICD-10-CM | POA: Diagnosis not present

## 2017-10-06 DIAGNOSIS — M3500 Sicca syndrome, unspecified: Secondary | ICD-10-CM | POA: Diagnosis not present

## 2017-10-06 DIAGNOSIS — Z6828 Body mass index (BMI) 28.0-28.9, adult: Secondary | ICD-10-CM | POA: Diagnosis not present

## 2017-10-06 DIAGNOSIS — E663 Overweight: Secondary | ICD-10-CM | POA: Diagnosis not present

## 2017-10-19 DIAGNOSIS — M1712 Unilateral primary osteoarthritis, left knee: Secondary | ICD-10-CM | POA: Diagnosis not present

## 2017-10-26 ENCOUNTER — Ambulatory Visit (INDEPENDENT_AMBULATORY_CARE_PROVIDER_SITE_OTHER): Payer: Medicare Other | Admitting: *Deleted

## 2017-10-26 DIAGNOSIS — Z23 Encounter for immunization: Secondary | ICD-10-CM | POA: Diagnosis not present

## 2017-11-09 DIAGNOSIS — M25562 Pain in left knee: Secondary | ICD-10-CM | POA: Diagnosis not present

## 2018-01-01 ENCOUNTER — Other Ambulatory Visit: Payer: Self-pay | Admitting: Family Medicine

## 2018-02-11 ENCOUNTER — Other Ambulatory Visit: Payer: Self-pay | Admitting: Family Medicine

## 2018-02-19 ENCOUNTER — Ambulatory Visit (INDEPENDENT_AMBULATORY_CARE_PROVIDER_SITE_OTHER): Payer: Medicare Other | Admitting: Family Medicine

## 2018-02-19 ENCOUNTER — Other Ambulatory Visit: Payer: Self-pay | Admitting: Family Medicine

## 2018-02-19 ENCOUNTER — Encounter: Payer: Self-pay | Admitting: Family Medicine

## 2018-02-19 VITALS — BP 118/84 | HR 90 | Temp 99.2°F | Ht 64.0 in | Wt 167.6 lb

## 2018-02-19 DIAGNOSIS — I10 Essential (primary) hypertension: Secondary | ICD-10-CM

## 2018-02-19 MED ORDER — AMLODIPINE BESYLATE 2.5 MG PO TABS: 2.5000 mg | ORAL_TABLET | Freq: Every day | ORAL | 3 refills | Status: DC

## 2018-02-19 NOTE — Progress Notes (Signed)
  Subjective:     Patient ID: Christy Moore, female   DOB: May 09, 1945, 73 y.o.   MRN: [id number]  HPI Patient is seen for follow-up hypertension.  She takes low-dose amlodipine 2.5 mg daily.  Her other problems include history of GERD and hypothyroidism.  She remains on Protonix 40 mg daily.  Reflux symptoms well controlled.  Denies any recent dizziness, headaches, or chest pain.  Compliant with therapy and no side effects.  Past Medical History:  Diagnosis Date  . Cardiomegaly   . Hypothyroidism   . Sinus tachycardia    Past Surgical History:  Procedure Laterality Date  . HAND TENDON SURGERY  02/09/2007   tendon transfer-Dr. Amedeo Plenty  . NASAL SEPTUM SURGERY     operated on by Dr. Ernesto Rutherford 20 years ago  . OVARIAN CYST REMOVAL     Right Sided Salpingo-oopherectomy  . SALPINGOOPHORECTOMY      reports that she has never smoked. She has never used smokeless tobacco. She reports that she does not drink alcohol or use drugs. family history includes Alcohol abuse in her maternal grandfather and maternal grandmother; Allergies in an other family member; Cancer in her father and mother. Allergies  Allergen Reactions  . Amoxicillin-Pot Clavulanate     REACTION: nausea  . Azithromycin     REACTION: nausea, Gi  . Celecoxib     REACTION: nausea  . Ciprofloxacin     REACTION: nausea  . Doxycycline     REACTION: gi upset  . Erythromycin     REACTION: nause, GI  . Hydrocodone     REACTION: nausea  . Hydrocodone-Acetaminophen     REACTION: GI upset  . Levofloxacin     REACTION: she claims renal failiure  . Propoxyphene N-Acetaminophen   . Rofecoxib     REACTION: nausea  . Tetanus Toxoid      Review of Systems  Constitutional: Negative for fatigue and unexpected weight change.  Eyes: Negative for visual disturbance.  Respiratory: Negative for cough, chest tightness, shortness of breath and wheezing.   Cardiovascular: Negative for chest pain, palpitations and leg swelling.   Neurological: Negative for dizziness, seizures, syncope, weakness, light-headedness and headaches.       Objective:   Physical Exam Constitutional:      Appearance: She is well-developed.  Neck:     Musculoskeletal: Neck supple.     Thyroid: No thyromegaly.     Vascular: No JVD.  Cardiovascular:     Rate and Rhythm: Normal rate and regular rhythm.     Heart sounds: No gallop.   Pulmonary:     Effort: Pulmonary effort is normal. No respiratory distress.     Breath sounds: Normal breath sounds. No wheezing or rales.  Neurological:     Mental Status: She is alert.        Assessment:     Hypertension.  Well-controlled.    Plan:     -Refilled amlodipine for 1 year -She will set up 3 to 17-month follow-up and will be due for repeat thyroid functions by then  Eulas Post MD [facility] at [facility]

## 2018-02-22 DIAGNOSIS — M542 Cervicalgia: Secondary | ICD-10-CM | POA: Diagnosis not present

## 2018-02-24 DIAGNOSIS — M1712 Unilateral primary osteoarthritis, left knee: Secondary | ICD-10-CM | POA: Diagnosis not present

## 2018-02-24 DIAGNOSIS — M25562 Pain in left knee: Secondary | ICD-10-CM | POA: Diagnosis not present

## 2018-03-03 DIAGNOSIS — M5412 Radiculopathy, cervical region: Secondary | ICD-10-CM | POA: Diagnosis not present

## 2018-03-04 DIAGNOSIS — M25562 Pain in left knee: Secondary | ICD-10-CM | POA: Diagnosis not present

## 2018-03-08 DIAGNOSIS — M5412 Radiculopathy, cervical region: Secondary | ICD-10-CM | POA: Diagnosis not present

## 2018-03-11 DIAGNOSIS — M5412 Radiculopathy, cervical region: Secondary | ICD-10-CM | POA: Diagnosis not present

## 2018-03-16 DIAGNOSIS — M5412 Radiculopathy, cervical region: Secondary | ICD-10-CM | POA: Diagnosis not present

## 2018-03-17 DIAGNOSIS — S83242A Other tear of medial meniscus, current injury, left knee, initial encounter: Secondary | ICD-10-CM | POA: Diagnosis not present

## 2018-03-17 DIAGNOSIS — M1712 Unilateral primary osteoarthritis, left knee: Secondary | ICD-10-CM | POA: Diagnosis not present

## 2018-03-18 DIAGNOSIS — M5412 Radiculopathy, cervical region: Secondary | ICD-10-CM | POA: Diagnosis not present

## 2018-04-06 ENCOUNTER — Ambulatory Visit: Payer: Self-pay | Admitting: Family Medicine

## 2018-04-06 NOTE — Telephone Encounter (Signed)
Please see message. °

## 2018-04-06 NOTE — Telephone Encounter (Signed)
Set up web visit if symptoms persist.

## 2018-04-06 NOTE — Telephone Encounter (Signed)
Pt. Reports she has had problems with cellulitis in the past with her right lower leg. Has a red area 1 inch above her ankle and it is the size of her palm. States no open area.It is warm to touch. No fever.It woke her up out of her sleep 2 nights ago. Started herself on Keflex 500 mg yesterday and "it looks better today." Would like more Keflex sent to her pharmacy. Instructed she may need a Web visit and she is willing to do this. Please advise pt.Spoke with Apolonio Schneiders in the practice. Answer Assessment - Initial Assessment Questions 1. SYMPTOM: "What's the main symptom you're concerned about?" (e.g., redness, swelling, pain, fever, weakness)     Redness  2. CELLULITIS LOCATION: "Where is the cellulitis  located?" (e.g., hand, arm, foot, leg, face)     Right lower leg - 1 inch above the ankle - palm size 3. CELLULITIS  SIZE: "What is the size of the red area?" (e.g., inches, centimeters; compare to size of a coin) .     Size of her palm 4. BETTER-SAME-WORSE: "Are you getting better, staying the same, or getting worse compared to the day you started the antibiotics?"  5.  PAIN: Do you have any pain?"  If so, "How bad is the pain?"  (e.g., Scale 1-10; mild, moderate, or severe)    - MILD (1-3): doesn't interfere with normal activities     - MODERATE (4-7): interferes with normal activities or awakens from sleep    - SEVERE (8-10): excruciating pain, unable to do any normal activities       Today  4-5 6.  FEVER: "Do you have a fever?" If so, ask: "What is it, how was it measured and when did it start?"     No 7. OTHER SYMPTOMS: "Do you have any other symptoms?" (e.g., pus coming from a wound, red streaks, weakness) 8.  DIAGNOSIS DATE: "When was the cellulitis diagnosed?" "By whom?"  9.  ANTIBIOTIC NAME: "What antibiotic(s) are you taking?"  "How many times per day?" (Be sure the patient is receiving the antibiotic as directed).      Started herself on Keflex 500 mg 10. ANTIBIOTIC DATE: "When was the  antibiotic started?"       She started herself on Keflex yesterday 11. FOLLOW-UP APPOINTMENT: "Do you have follow-up appointment with your doctor?"       No  Protocols used: CELLULITIS ON ANTIBIOTIC FOLLOW-UP CALL-A-AH

## 2018-04-07 ENCOUNTER — Ambulatory Visit (INDEPENDENT_AMBULATORY_CARE_PROVIDER_SITE_OTHER): Payer: Medicare Other | Admitting: Family Medicine

## 2018-04-07 ENCOUNTER — Other Ambulatory Visit: Payer: Self-pay

## 2018-04-07 DIAGNOSIS — R208 Other disturbances of skin sensation: Secondary | ICD-10-CM

## 2018-04-07 DIAGNOSIS — L03115 Cellulitis of right lower limb: Secondary | ICD-10-CM

## 2018-04-07 DIAGNOSIS — L539 Erythematous condition, unspecified: Secondary | ICD-10-CM | POA: Diagnosis not present

## 2018-04-07 MED ORDER — CEPHALEXIN 500 MG PO CAPS: 500.0000 mg | ORAL_CAPSULE | Freq: Three times a day (TID) | ORAL | 0 refills | Status: DC

## 2018-04-07 NOTE — Progress Notes (Signed)
Patient ID: Christy Moore, female   DOB: [birth year], 73 y.o.   MRN: [id number]  Virtual Visit via Video Note  I connected with Christy Moore on 04/07/18 at 11:00 AM EDT by a video enabled telemedicine application and verified that I am speaking with the correct person using two identifiers.  Location patient: home Location provider:work or home office Persons participating in the virtual visit: patient, provider  I discussed the limitations of evaluation and management by telemedicine and the availability of in person appointments. The patient expressed understanding and agreed to proceed.   HPI: Patient called with concerns of some recent redness involving her right lower leg.  She had been treated for cellulitis involving the right fifth toe back in August and those symptoms eventually improved.  She states the symptoms are very similar but different location.  She noticed Sunday morning about 1 inch above her right ankle some pinkish discoloration along with increased pain and warmth.  She had some leftover Keflex and started back 3 times daily and states that her pain is much improved and she has noticed some decrease in warmth.  She still has some mild redness.  No history of MRSA.  No systemic fever.  No chills.  No nausea or vomiting.  Denies any recent injury.   ROS: See pertinent positives and negatives per HPI.  Past Medical History:  Diagnosis Date  . Cardiomegaly   . Hypothyroidism   . Sinus tachycardia     Past Surgical History:  Procedure Laterality Date  . HAND TENDON SURGERY  02/09/2007   tendon transfer-Dr. Amedeo Plenty  . NASAL SEPTUM SURGERY     operated on by Dr. Ernesto Rutherford 20 years ago  . OVARIAN CYST REMOVAL     Right Sided Salpingo-oopherectomy  . SALPINGOOPHORECTOMY      Family History  Problem Relation Age of Onset  . Allergies Other        Grandmother-pt states she takes after grandmother who had multiple allergies  . Cancer Mother        breast and lung  .  Cancer Father        prostate  . Alcohol abuse Maternal Grandmother   . Alcohol abuse Maternal Grandfather     SOCIAL HX: Non-smoker.  No regular alcohol.   Current Outpatient Medications:  .  amLODipine (NORVASC) 2.5 MG tablet, Take 1 tablet (2.5 mg total) by mouth daily., Disp: 90 tablet, Rfl: 3 .  cephALEXin (KEFLEX) 500 MG capsule, Take 1 capsule (500 mg total) by mouth 3 (three) times daily., Disp: 21 capsule, Rfl: 0 .  fluticasone (FLONASE) 50 MCG/ACT nasal spray, Place into both nostrils daily., Disp: , Rfl:  .  levothyroxine (SYNTHROID, LEVOTHROID) 100 MCG tablet, TAKE 1 TABLET BY MOUTH EVERY DAY, Disp: 90 tablet, Rfl: 1 .  pantoprazole (PROTONIX) 40 MG tablet, TAKE 1 TABLET BY MOUTH EVERY DAY, Disp: 90 tablet, Rfl: 1  EXAM:  VITALS per patient if applicable:  GENERAL: alert, oriented, appears well and in no acute distress  HEENT: atraumatic, conjunttiva clear, no obvious abnormalities on inspection of external nose and ears  NECK: normal movements of the head and neck  LUNGS: on inspection no signs of respiratory distress, breathing rate appears normal, no obvious gross SOB, gasping or wheezing  CV: no obvious cyanosis  MS: moves all visible extremities without noticeable abnormality  PSYCH/NEURO: pleasant and cooperative, no obvious depression or anxiety, speech and thought processing grossly intact  ASSESSMENT AND PLAN:  Discussed the following assessment and  plan:  Probable cellulitis right lower leg-patient nontoxic in appearance.  No systemic fever. -We decided to call and to extend her Keflex 500 mg 3 times daily for another 7 days -Elevate leg frequently and continue warm compresses -Follow-up immediately for any systemic fever, progressive redness, or other concerns     I discussed the assessment and treatment plan with the patient. The patient was provided an opportunity to ask questions and all were answered. The patient agreed with the plan and  demonstrated an understanding of the instructions.   The patient was advised to call back or seek an in-person evaluation if the symptoms worsen or if the condition fails to improve as anticipated.   [name], MD

## 2018-04-07 NOTE — Telephone Encounter (Signed)
Patient has a WebEx appointment with you today at 11am.  Patient stated that she had a few pills of Keflex 500 mg left and she thinks it is helping some but it is hard on her stomach and she has to be very careful with what she takes. She only has 4 pills left and took 3 yesterday.

## 2018-04-19 DIAGNOSIS — M1712 Unilateral primary osteoarthritis, left knee: Secondary | ICD-10-CM | POA: Diagnosis not present

## 2018-04-19 DIAGNOSIS — S83242A Other tear of medial meniscus, current injury, left knee, initial encounter: Secondary | ICD-10-CM | POA: Diagnosis not present

## 2018-04-21 DIAGNOSIS — H25813 Combined forms of age-related cataract, bilateral: Secondary | ICD-10-CM | POA: Diagnosis not present

## 2018-04-21 DIAGNOSIS — D3132 Benign neoplasm of left choroid: Secondary | ICD-10-CM | POA: Diagnosis not present

## 2018-04-21 DIAGNOSIS — M3501 Sicca syndrome with keratoconjunctivitis: Secondary | ICD-10-CM | POA: Diagnosis not present

## 2018-05-13 ENCOUNTER — Telehealth: Payer: Self-pay | Admitting: *Deleted

## 2018-05-13 NOTE — Telephone Encounter (Signed)
Left message. Need to r/s awv for July

## 2018-06-02 ENCOUNTER — Ambulatory Visit: Payer: Medicare Other

## 2018-06-25 ENCOUNTER — Other Ambulatory Visit: Payer: Self-pay | Admitting: Family Medicine

## 2018-06-28 ENCOUNTER — Other Ambulatory Visit: Payer: Self-pay

## 2018-06-28 ENCOUNTER — Ambulatory Visit (INDEPENDENT_AMBULATORY_CARE_PROVIDER_SITE_OTHER): Payer: Medicare Other | Admitting: Family Medicine

## 2018-06-28 VITALS — Wt 162.2 lb

## 2018-06-28 DIAGNOSIS — E038 Other specified hypothyroidism: Secondary | ICD-10-CM | POA: Diagnosis not present

## 2018-06-28 DIAGNOSIS — I10 Essential (primary) hypertension: Secondary | ICD-10-CM | POA: Diagnosis not present

## 2018-06-28 MED ORDER — LEVOTHYROXINE SODIUM 100 MCG PO TABS: 100.0000 ug | ORAL_TABLET | Freq: Every day | ORAL | 3 refills | Status: DC

## 2018-06-28 NOTE — Telephone Encounter (Signed)
Patient has an appointment today at 3:45pm and needs labs.

## 2018-06-28 NOTE — Progress Notes (Signed)
Patient ID: Christy Moore, female   DOB: 1945/10/30, 73 y.o.   MRN: [id number]  This visit type was conducted due to national recommendations for restrictions regarding the COVID-19 pandemic in an effort to limit this patient's exposure and mitigate transmission in our community.   Virtual Visit via Video Note  I connected with Maryna Dunleavy on 06/28/18 at  3:45 PM EDT by a video enabled telemedicine application and verified that I am speaking with the correct person using two identifiers.  Location patient: home Location provider:work or home office Persons participating in the virtual visit: patient, provider  I discussed the limitations of evaluation and management by telemedicine and the availability of in person appointments. The patient expressed understanding and agreed to proceed.   HPI: Patient seen for medical follow-up.  She is overdue for labs.  She has hypertension treated with low-dose amlodipine 2.5 mg daily.  She takes levothyroxine 100 mcg daily for hypothyroidism.  Her levels have generally been stable.  She is compliant with therapy.  She has no specific complaints currently.  Does not monitor blood pressure regularly.  No recent headaches, dizziness, chest pains.   ROS: See pertinent positives and negatives per HPI.  Past Medical History:  Diagnosis Date  . Cardiomegaly   . Hypothyroidism   . Sinus tachycardia     Past Surgical History:  Procedure Laterality Date  . HAND TENDON SURGERY  02/09/2007   tendon transfer-Dr. Amedeo Plenty  . NASAL SEPTUM SURGERY     operated on by Dr. Ernesto Rutherford 20 years ago  . OVARIAN CYST REMOVAL     Right Sided Salpingo-oopherectomy  . SALPINGOOPHORECTOMY      Family History  Problem Relation Age of Onset  . Allergies Other        Grandmother-pt states she takes after grandmother who had multiple allergies  . Cancer Mother        breast and lung  . Cancer Father        prostate  . Alcohol abuse Maternal Grandmother   . Alcohol  abuse Maternal Grandfather     SOCIAL HX: Non-smoker   Current Outpatient Medications:  .  amLODipine (NORVASC) 2.5 MG tablet, Take 1 tablet (2.5 mg total) by mouth daily., Disp: 90 tablet, Rfl: 3 .  cephALEXin (KEFLEX) 500 MG capsule, Take 1 capsule (500 mg total) by mouth 3 (three) times daily., Disp: 21 capsule, Rfl: 0 .  fluticasone (FLONASE) 50 MCG/ACT nasal spray, Place into both nostrils daily., Disp: , Rfl:  .  levothyroxine (SYNTHROID) 100 MCG tablet, Take 1 tablet (100 mcg total) by mouth daily., Disp: 90 tablet, Rfl: 3 .  pantoprazole (PROTONIX) 40 MG tablet, TAKE 1 TABLET BY MOUTH EVERY DAY, Disp: 90 tablet, Rfl: 1  EXAM:  VITALS per patient if applicable:  GENERAL: alert, oriented, appears well and in no acute distress  HEENT: atraumatic, conjunttiva clear, no obvious abnormalities on inspection of external nose and ears  NECK: normal movements of the head and neck  LUNGS: on inspection no signs of respiratory distress, breathing rate appears normal, no obvious gross SOB, gasping or wheezing  CV: no obvious cyanosis  MS: moves all visible extremities without noticeable abnormality  PSYCH/NEURO: pleasant and cooperative, no obvious depression or anxiety, speech and thought processing grossly intact  ASSESSMENT AND PLAN:  Discussed the following assessment and plan:  #1 hypothyroidism -Recheck TSH.  She will come in tomorrow for labs  #2 hypertension which has been controlled on low-dose amlodipine -Check basic metabolic panel  I discussed the assessment and treatment plan with the patient. The patient was provided an opportunity to ask questions and all were answered. The patient agreed with the plan and demonstrated an understanding of the instructions.   The patient was advised to call back or seek an in-person evaluation if the symptoms worsen or if the condition fails to improve as anticipated.   [name], MD

## 2018-06-29 ENCOUNTER — Other Ambulatory Visit (INDEPENDENT_AMBULATORY_CARE_PROVIDER_SITE_OTHER): Payer: Medicare Other

## 2018-06-29 ENCOUNTER — Other Ambulatory Visit: Payer: Self-pay

## 2018-06-29 DIAGNOSIS — I10 Essential (primary) hypertension: Secondary | ICD-10-CM | POA: Diagnosis not present

## 2018-06-29 DIAGNOSIS — E038 Other specified hypothyroidism: Secondary | ICD-10-CM

## 2018-06-29 LAB — BASIC METABOLIC PANEL
BUN: 15 mg/dL (ref 6–23)
CO2: 27 mEq/L (ref 19–32)
Calcium: 9.2 mg/dL (ref 8.4–10.5)
Chloride: 104 mEq/L (ref 96–112)
Creatinine, Ser: 0.82 mg/dL (ref 0.40–1.20)
GFR: 68.65 mL/min (ref >60.00)
Glucose, Bld: 100 mg/dL — ABNORMAL HIGH (ref 70–99)
Potassium: 3.9 mEq/L (ref 3.5–5.1)
Sodium: 140 mEq/L (ref 135–145)

## 2018-06-29 LAB — TSH: TSH: 1.78 u[IU]/mL (ref 0.35–4.50)

## 2018-07-27 ENCOUNTER — Ambulatory Visit: Payer: Medicare Other

## 2018-08-17 ENCOUNTER — Other Ambulatory Visit: Payer: Self-pay | Admitting: Family Medicine

## 2018-09-01 DIAGNOSIS — M79641 Pain in right hand: Secondary | ICD-10-CM | POA: Diagnosis not present

## 2018-09-15 ENCOUNTER — Ambulatory Visit: Payer: Medicare Other

## 2018-09-16 ENCOUNTER — Ambulatory Visit (INDEPENDENT_AMBULATORY_CARE_PROVIDER_SITE_OTHER): Payer: Medicare Other

## 2018-09-16 ENCOUNTER — Other Ambulatory Visit: Payer: Self-pay

## 2018-09-16 DIAGNOSIS — Z23 Encounter for immunization: Secondary | ICD-10-CM

## 2018-09-16 NOTE — Patient Instructions (Signed)
Health Maintenance Due  Topic Date Due  . MAMMOGRAM  02/17/1997  . DEXA SCAN  02/18/2012  . INFLUENZA VACCINE  08/07/2018    Depression screen [facility] 2/9 05/27/2017 05/23/2016 03/24/2016  Decreased Interest 3 1 0  Down, Depressed, Hopeless 3 0 1  PHQ - 2 Score 6 1 1   Altered sleeping 0 - -  Tired, decreased energy 0 - -  Change in appetite 1 - -  Feeling bad or failure about yourself  0 - -  Trouble concentrating 0 - -  Moving slowly or fidgety/restless 0 - -  Suicidal thoughts 0 - -  PHQ-9 Score 7 - -  Difficult doing work/chores Very difficult - -

## 2018-10-03 ENCOUNTER — Other Ambulatory Visit: Payer: Self-pay

## 2018-10-03 ENCOUNTER — Encounter (HOSPITAL_COMMUNITY): Payer: Self-pay | Admitting: Emergency Medicine

## 2018-10-03 DIAGNOSIS — L03115 Cellulitis of right lower limb: Secondary | ICD-10-CM | POA: Diagnosis not present

## 2018-10-03 DIAGNOSIS — M3500 Sicca syndrome, unspecified: Secondary | ICD-10-CM | POA: Insufficient documentation

## 2018-10-03 DIAGNOSIS — E039 Hypothyroidism, unspecified: Secondary | ICD-10-CM | POA: Insufficient documentation

## 2018-10-03 DIAGNOSIS — Z79899 Other long term (current) drug therapy: Secondary | ICD-10-CM | POA: Insufficient documentation

## 2018-10-03 DIAGNOSIS — I10 Essential (primary) hypertension: Secondary | ICD-10-CM | POA: Diagnosis not present

## 2018-10-03 NOTE — ED Triage Notes (Signed)
Patient complaining of right lower leg pain and cellulitis. Patient states it started last night with foot being cold. Patient states she took a tylenol and it has eased up some. Patient states it hurts to put weight on it.

## 2018-10-04 ENCOUNTER — Emergency Department (HOSPITAL_COMMUNITY)
Admission: EM | Admit: 2018-10-04 | Discharge: 2018-10-04 | Disposition: A | Discharge status: Home / Self Care | Payer: Medicare Other | Attending: Emergency Medicine | Admitting: Emergency Medicine

## 2018-10-04 DIAGNOSIS — L03115 Cellulitis of right lower limb: Secondary | ICD-10-CM

## 2018-10-04 LAB — CBC WITH DIFFERENTIAL/PLATELET
Abs Immature Granulocytes: 0.03 10*3/uL (ref 0.00–0.07)
Basophils Absolute: 0 10*3/uL (ref 0.0–0.1)
Basophils Relative: 1 %
Eosinophils Absolute: 0.2 10*3/uL (ref 0.0–0.5)
Eosinophils Relative: 3 %
HCT: 49.8 % — ABNORMAL HIGH (ref 36.0–46.0)
Hemoglobin: 15.4 g/dL — ABNORMAL HIGH (ref 12.0–15.0)
Immature Granulocytes: 0 %
Lymphocytes Relative: 20 %
Lymphs Abs: 1.8 10*3/uL (ref 0.7–4.0)
MCH: 30.4 pg (ref 26.0–34.0)
MCHC: 30.9 g/dL (ref 30.0–36.0)
MCV: 98.4 fL (ref 80.0–100.0)
Monocytes Absolute: 0.8 10*3/uL (ref 0.1–1.0)
Monocytes Relative: 9 %
Neutro Abs: 6 10*3/uL (ref 1.7–7.7)
Neutrophils Relative %: 67 %
Platelets: 192 10*3/uL (ref 150–400)
RBC: 5.06 MIL/uL (ref 3.87–5.11)
RDW: 14.2 % (ref 11.5–15.5)
WBC: 8.9 10*3/uL (ref 4.0–10.5)
nRBC: 0 % (ref 0.0–0.2)

## 2018-10-04 LAB — BASIC METABOLIC PANEL
Anion gap: 14 (ref 5–15)
BUN: 12 mg/dL (ref 8–23)
CO2: 19 mmol/L — ABNORMAL LOW (ref 22–32)
Calcium: 9.2 mg/dL (ref 8.9–10.3)
Chloride: 108 mmol/L (ref 98–111)
Creatinine, Ser: 0.74 mg/dL (ref 0.44–1.00)
GFR calc Af Amer: >60 mL/min (ref >60)
GFR calc non Af Amer: >60 mL/min (ref >60)
Glucose, Bld: 104 mg/dL — ABNORMAL HIGH (ref 70–99)
Potassium: 3.7 mmol/L (ref 3.5–5.1)
Sodium: 141 mmol/L (ref 135–145)

## 2018-10-04 MED ORDER — CEPHALEXIN 500 MG PO CAPS
500.0000 mg | ORAL_CAPSULE | Freq: Once | ORAL | Status: AC
  Administered 2018-10-04: 500 mg via ORAL
  Filled 2018-10-04: qty 1

## 2018-10-04 MED ORDER — CEPHALEXIN 500 MG PO CAPS: 500.0000 mg | ORAL_CAPSULE | Freq: Three times a day (TID) | ORAL | 0 refills | Status: DC

## 2018-10-04 MED ORDER — ACETAMINOPHEN 325 MG PO TABS
325.0000 mg | ORAL_TABLET | Freq: Once | ORAL | Status: AC
Start: 2018-10-04 — End: 2018-10-04
  Administered 2018-10-04: 03:00:00 325 mg via ORAL
  Filled 2018-10-04: qty 1

## 2018-10-04 NOTE — ED Provider Notes (Signed)
[facility]al DEPT Provider Note   CSN: [id number] Arrival date & time: 10/03/18  2035     History   Chief Complaint No chief complaint on file.   HPI Christy Moore is a 73 y.o. female.     Patient to ED with complaint of recurrent cellulitis to the right lower extremity. No fever, wound, drainage or significant swelling. She reports the last episode of infection in the extremity was less than one year ago. No injury to the leg. She denies calf pain, SOB or chest pain. No nausea, vomiting.   The history is provided by the patient. No language interpreter was used.    Past Medical History:  Diagnosis Date  . Cardiomegaly   . Hypothyroidism   . Sinus tachycardia     Patient Active Problem List   Diagnosis Date Noted  . Thrush 11/03/2012  . Sjogren's disease ([place]) 01/22/2012  . GERD (gastroesophageal reflux disease) 03/19/2011  . Hypertension 03/19/2011  . Raynaud's disease 03/19/2011  . Cardiomegaly 02/26/2010  . Hypothyroidism 02/26/2010  . Wrist fracture, left 02/26/2010  . Liver hemangioma 02/26/2010  . Migraine 02/26/2010  . Esophageal stricture 02/26/2010  . Weight loss 02/26/2010  . OTHER CHRONIC SINUSITIS 12/20/2007  . Rosacea 12/20/2007    Past Surgical History:  Procedure Laterality Date  . HAND TENDON SURGERY  02/09/2007   tendon transfer-Dr. Amedeo Plenty  . NASAL SEPTUM SURGERY     operated on by Dr. Ernesto Rutherford 20 years ago  . OVARIAN CYST REMOVAL     Right Sided Salpingo-oopherectomy  . SALPINGOOPHORECTOMY       OB History   No obstetric history on file.      Home Medications    Prior to Admission medications   Medication Sig Start Date End Date Taking? Authorizing Provider  amLODipine (NORVASC) 2.5 MG tablet Take 1 tablet (2.5 mg total) by mouth daily. 02/19/18   Burchette, Alinda Sierras, MD  cephALEXin (KEFLEX) 500 MG capsule Take 1 capsule (500 mg total) by mouth 3 (three) times daily. 04/07/18   Burchette, Alinda Sierras, MD   fluticasone (FLONASE) 50 MCG/ACT nasal spray Place into both nostrils daily.    [provider]  levothyroxine (SYNTHROID) 100 MCG tablet Take 1 tablet (100 mcg total) by mouth daily. 06/28/18   Burchette, Alinda Sierras, MD  pantoprazole (PROTONIX) 40 MG tablet TAKE 1 TABLET BY MOUTH EVERY DAY 08/17/18   Burchette, Alinda Sierras, MD  metoprolol succinate (TOPROL-XL) 25 MG 24 hr tablet 1/2 tablet twice a day 03/17/11 03/19/11  Burnice Logan, MD  mupirocin (BACTROBAN) 2 % nasal ointment by Nasal route 2 (two) times daily. Use one-half of tube in each nostril twice daily for five (5) days. After application, press sides of nose together and gently massage.   03/19/11  [provider]  topiramate (TOPAMAX) 25 MG tablet Take 25 mg by mouth at bedtime.    03/19/11  [provider]    Family History Family History  Problem Relation Age of Onset  . Allergies Other        Grandmother-pt states she takes after grandmother who had multiple allergies  . Cancer Mother        breast and lung  . Cancer Father        prostate  . Alcohol abuse Maternal Grandmother   . Alcohol abuse Maternal Grandfather     Social History Social History   Tobacco Use  . Smoking status: Never Smoker  . Smokeless tobacco: Never  Used  Substance Use Topics  . Alcohol use: No  . Drug use: No     Allergies   Amoxicillin-pot clavulanate, Azithromycin, Celecoxib, Ciprofloxacin, Doxycycline, Erythromycin, Hydrocodone, Hydrocodone-acetaminophen, Levofloxacin, Penicillins, Propoxyphene n-acetaminophen, Rofecoxib, Sulfa antibiotics, and Tetanus toxoid   Review of Systems Review of Systems  Constitutional: Negative for chills and fever.  Respiratory: Negative.  Negative for shortness of breath.   Cardiovascular: Negative.  Negative for chest pain and leg swelling.  Gastrointestinal: Negative.  Negative for nausea and vomiting.  Musculoskeletal:       See HPI.  Skin: Positive for color change. Negative for  wound.       See HPI.  Neurological: Negative.  Negative for weakness and numbness.     Physical Exam Updated Vital Signs BP 135/65   Pulse 74   Temp 98 F (36.7 C) (Oral)   Resp 16   Ht 5\' 4"  (1.626 m)   Wt 72.6 kg   SpO2 98%   BMI 27.46 kg/m   Physical Exam Constitutional:      Appearance: She is well-developed.  Neck:     Musculoskeletal: Normal range of motion.  Pulmonary:     Effort: Pulmonary effort is normal.  Musculoskeletal: Normal range of motion.     Right lower leg: No edema.     Left lower leg: No edema.  Skin:    General: Skin is warm and dry.     Findings: Erythema present.     Comments: Distal right lower extremity, anterior surface erythema. No swelling, lesion, drainage.   Neurological:     Mental Status: She is alert and oriented to person, place, and time.     Sensory: No sensory deficit.      ED Treatments / Results  Labs (all labs ordered are listed, but only abnormal results are displayed) Labs Reviewed  CBC WITH DIFFERENTIAL/PLATELET  BASIC METABOLIC PANEL   Results for orders placed or performed during the hospital encounter of 10/04/18  CBC with Differential  Result Value Ref Range   WBC 8.9 4.0 - 10.5 K/uL   RBC 5.06 3.87 - 5.11 MIL/uL   Hemoglobin 15.4 (H) 12.0 - 15.0 g/dL   HCT 49.8 (H) 36.0 - 46.0 %   MCV 98.4 80.0 - 100.0 fL   MCH 30.4 26.0 - 34.0 pg   MCHC 30.9 30.0 - 36.0 g/dL   RDW 14.2 11.5 - 15.5 %   Platelets 192 150 - 400 K/uL   nRBC 0.0 0.0 - 0.2 %   Neutrophils Relative % 67 %   Neutro Abs 6.0 1.7 - 7.7 K/uL   Lymphocytes Relative 20 %   Lymphs Abs 1.8 0.7 - 4.0 K/uL   Monocytes Relative 9 %   Monocytes Absolute 0.8 0.1 - 1.0 K/uL   Eosinophils Relative 3 %   Eosinophils Absolute 0.2 0.0 - 0.5 K/uL   Basophils Relative 1 %   Basophils Absolute 0.0 0.0 - 0.1 K/uL   Immature Granulocytes 0 %   Abs Immature Granulocytes 0.03 0.00 - 0.07 K/uL  Basic metabolic panel  Result Value Ref Range   Sodium 141 135 -  145 mmol/L   Potassium 3.7 3.5 - 5.1 mmol/L   Chloride 108 98 - 111 mmol/L   CO2 19 (L) 22 - 32 mmol/L   Glucose, Bld 104 (H) 70 - 99 mg/dL   BUN 12 8 - 23 mg/dL   Creatinine, Ser 0.74 0.44 - 1.00 mg/dL   Calcium 9.2 8.9 - 10.3 mg/dL  GFR calc non Af Amer >60 >60 mL/min   GFR calc Af Amer >60 >60 mL/min   Anion gap 14 5 - 15     EKG None  Radiology No results found.  Procedures Procedures (including critical care time)  Medications Ordered in ED Medications  cephALEXin (KEFLEX) capsule 500 mg (500 mg Oral Given 10/04/18 0258)  acetaminophen (TYLENOL) tablet 325 mg (325 mg Oral Given 10/04/18 0258)     Initial Impression / Assessment and Plan / ED Course  I have reviewed the triage vital signs and the nursing notes.  Pertinent labs & imaging results that were available during my care of the patient were reviewed by me and considered in my medical decision making (see chart for details).        Patient to ED with symptoms of recurrent cellulitis of right lower extremity.   She and husband report the infection always occurs in the same location and in the same way as current symptoms. No fever. No history of DVT and no calf pain or LE swelling tonight.   Symptoms are consistent with recurrent infection for which she take Keflex with good result. She is well established in the outpatient setting for close follow up. Labs are unremarkable. She is considered stable for discharge home.   Final Clinical Impressions(s) / ED Diagnoses   Final diagnoses:  None   1. Recurrent right LE cellulitis  ED Discharge Orders    None       Charlann Lange, Hershal Coria 10/04/18 [id number]    Ezequiel Essex, MD 10/04/18 0930

## 2018-10-04 NOTE — ED Notes (Signed)
Pt verbalized discharge instructions and follow up care. Alert and ambulatory. No IV. Leaving with husband.

## 2018-10-04 NOTE — ED Notes (Signed)
Pt ambulated without assistance. Gait steady. Slight limp noted.

## 2018-10-04 NOTE — Discharge Instructions (Signed)
Take Keflex for infection and continue Tylenol for pain. Please call Dr. Elease Hashimoto and schedule a recheck appointment in 2 days. REturn to the ED with any high fever, severe pain or new concern.

## 2018-10-06 ENCOUNTER — Ambulatory Visit (INDEPENDENT_AMBULATORY_CARE_PROVIDER_SITE_OTHER): Payer: Medicare Other | Admitting: Family Medicine

## 2018-10-06 ENCOUNTER — Encounter: Payer: Self-pay | Admitting: Family Medicine

## 2018-10-06 ENCOUNTER — Other Ambulatory Visit: Payer: Self-pay

## 2018-10-06 VITALS — BP 120/62 | HR 92 | Temp 97.6°F | Wt 162.4 lb

## 2018-10-06 DIAGNOSIS — L03115 Cellulitis of right lower limb: Secondary | ICD-10-CM

## 2018-10-06 NOTE — Patient Instructions (Signed)

## 2018-10-06 NOTE — Progress Notes (Signed)
  Subjective:     Patient ID: Christy Moore, female   DOB: Dec 10, 1945, 73 y.o.   MRN: [id number]  HPI Ivin Booty is seen for ER follow-up.  She has had past history of cellulitis right lower extremity and started last weekend with some redness and pain which had progressed by Sunday night.  That is when she went in to get checked out.  Her white count was 8.9 thousand.  Chemistries were normal.  She was placed on Keflex 500 mg 3 times daily.  Her pain is improving.  Less swelling.  Less erythema.  No fevers or chills.  No history of MRSA.  Past Medical History:  Diagnosis Date  . Cardiomegaly   . Hypothyroidism   . Sinus tachycardia    Past Surgical History:  Procedure Laterality Date  . HAND TENDON SURGERY  02/09/2007   tendon transfer-Dr. Amedeo Plenty  . NASAL SEPTUM SURGERY     operated on by Dr. Ernesto Rutherford 20 years ago  . OVARIAN CYST REMOVAL     Right Sided Salpingo-oopherectomy  . SALPINGOOPHORECTOMY      reports that she has never smoked. She has never used smokeless tobacco. She reports that she does not drink alcohol or use drugs. family history includes Alcohol abuse in her maternal grandfather and maternal grandmother; Allergies in an other family member; Cancer in her father and mother. Allergies  Allergen Reactions  . Amoxicillin-Pot Clavulanate     REACTION: nausea  . Azithromycin     REACTION: nausea, Gi  . Celecoxib     REACTION: nausea  . Ciprofloxacin     REACTION: nausea  . Doxycycline     REACTION: gi upset  . Erythromycin     REACTION: nause, GI  . Hydrocodone     REACTION: nausea  . Hydrocodone-Acetaminophen     REACTION: GI upset  . Levofloxacin     REACTION: she claims renal failiure  . Penicillins   . Propoxyphene N-Acetaminophen   . Rofecoxib     REACTION: nausea  . Sulfa Antibiotics   . Tetanus Toxoid      Review of Systems  Constitutional: Negative for chills and fever.  Respiratory: Negative for shortness of breath.   Gastrointestinal:  Negative for diarrhea, nausea and vomiting.       Objective:   Physical Exam Constitutional:      Appearance: Normal appearance.  Cardiovascular:     Rate and Rhythm: Normal rate and regular rhythm.  Pulmonary:     Effort: Pulmonary effort is normal.     Breath sounds: Normal breath sounds.  Skin:    Comments: Patient has some erythema involving about halfway down her right anterior leg to roughly the ankle region.  Sparing of the foot.  Slightly warm to touch.  Mildly tender to palpation.  No significant edema.  No open wounds.  Ambulating without difficulty  Neurological:     Mental Status: She is alert.        Assessment:     Cellulitis right lower leg improving on Keflex.  This is been recurrent and oddly in the same location.  No recent injury.  Patient nontoxic.  Recent white count normal.    Plan:     -Finish out Keflex -Elevate leg frequently -Touch base by next week if not continuing to improve  Eulas Post MD [facility]

## 2018-11-08 ENCOUNTER — Other Ambulatory Visit: Payer: Self-pay

## 2018-11-08 DIAGNOSIS — Z20822 Contact with and (suspected) exposure to covid-19: Secondary | ICD-10-CM

## 2018-11-08 DIAGNOSIS — Z20828 Contact with and (suspected) exposure to other viral communicable diseases: Secondary | ICD-10-CM | POA: Diagnosis not present

## 2018-11-10 LAB — NOVEL CORONAVIRUS, NAA: SARS-CoV-2, NAA: NOT DETECTED

## 2018-11-11 ENCOUNTER — Telehealth: Payer: Self-pay

## 2018-11-11 NOTE — Telephone Encounter (Signed)
Negative COVID results given. Patient results "NOT Detected." Caller expressed understanding. ° °

## 2018-12-01 ENCOUNTER — Other Ambulatory Visit: Payer: Self-pay

## 2018-12-11 ENCOUNTER — Other Ambulatory Visit: Payer: Self-pay

## 2018-12-11 ENCOUNTER — Encounter (HOSPITAL_COMMUNITY): Payer: Self-pay

## 2018-12-11 ENCOUNTER — Emergency Department (HOSPITAL_COMMUNITY): Payer: Medicare Other

## 2018-12-11 ENCOUNTER — Emergency Department (HOSPITAL_COMMUNITY)
Admission: EM | Admit: 2018-12-11 | Discharge: 2018-12-11 | Disposition: A | Discharge status: Home / Self Care | Payer: Medicare Other | Attending: Emergency Medicine | Admitting: Emergency Medicine

## 2018-12-11 DIAGNOSIS — E039 Hypothyroidism, unspecified: Secondary | ICD-10-CM | POA: Insufficient documentation

## 2018-12-11 DIAGNOSIS — R079 Chest pain, unspecified: Secondary | ICD-10-CM | POA: Insufficient documentation

## 2018-12-11 DIAGNOSIS — Z79899 Other long term (current) drug therapy: Secondary | ICD-10-CM | POA: Insufficient documentation

## 2018-12-11 DIAGNOSIS — M3500 Sicca syndrome, unspecified: Secondary | ICD-10-CM | POA: Insufficient documentation

## 2018-12-11 DIAGNOSIS — I10 Essential (primary) hypertension: Secondary | ICD-10-CM | POA: Insufficient documentation

## 2018-12-11 HISTORY — DX: Essential (primary) hypertension: I10

## 2018-12-11 HISTORY — DX: Gastro-esophageal reflux disease without esophagitis: K21.9

## 2018-12-11 LAB — BASIC METABOLIC PANEL
Anion gap: 12 (ref 5–15)
BUN: 13 mg/dL (ref 8–23)
CO2: 25 mmol/L (ref 22–32)
Calcium: 9.5 mg/dL (ref 8.9–10.3)
Chloride: 103 mmol/L (ref 98–111)
Creatinine, Ser: 0.88 mg/dL (ref 0.44–1.00)
GFR calc Af Amer: >60 mL/min (ref >60)
GFR calc non Af Amer: >60 mL/min (ref >60)
Glucose, Bld: 112 mg/dL — ABNORMAL HIGH (ref 70–99)
Potassium: 4 mmol/L (ref 3.5–5.1)
Sodium: 140 mmol/L (ref 135–145)

## 2018-12-11 LAB — CBC
HCT: 45.9 % (ref 36.0–46.0)
Hemoglobin: 14.9 g/dL (ref 12.0–15.0)
MCH: 30.5 pg (ref 26.0–34.0)
MCHC: 32.5 g/dL (ref 30.0–36.0)
MCV: 93.9 fL (ref 80.0–100.0)
Platelets: 200 10*3/uL (ref 150–400)
RBC: 4.89 MIL/uL (ref 3.87–5.11)
RDW: 13.8 % (ref 11.5–15.5)
WBC: 10.3 10*3/uL (ref 4.0–10.5)
nRBC: 0 % (ref 0.0–0.2)

## 2018-12-11 LAB — TROPONIN I (HIGH SENSITIVITY)
Troponin I (High Sensitivity): 13 ng/L (ref <18)
Troponin I (High Sensitivity): 14 ng/L (ref <18)

## 2018-12-11 MED ORDER — ACETAMINOPHEN 325 MG PO TABS
650.0000 mg | ORAL_TABLET | Freq: Once | ORAL | Status: AC
  Administered 2018-12-11: 650 mg via ORAL
  Filled 2018-12-11: qty 2

## 2018-12-11 MED ORDER — SODIUM CHLORIDE 0.9% FLUSH: 3.0000 mL | Freq: Once | INTRAVENOUS | Status: DC

## 2018-12-11 NOTE — ED Provider Notes (Signed)
[facility] DEPT Provider Note   CSN: [id number] Arrival date & time: 12/11/18  1302     History   Chief Complaint Chief Complaint  Patient presents with  . Chest Pain    HPI Christy Moore is a 73 y.o. female with a history of hypertension, cardiomegaly, hypothyroidism, GERD, Sjogren's, and migraines who presents to the emergency department with complaints of chest pain that began this morning.  Patient states that she woke up with a headache to the superior aspect of her head as well as some chest discomfort, the headache was worse at the time therefore she did not pay much mind to the chest discomfort.  She drank some coffee and the headache seemed to resolve spontaneously.  However the chest discomfort seem to persist.  She states it is an aching discomfort to the anterior chest that is currently an 8 out of 10 in severity.  She states that the chest discomfort is worse with position changes/movement, she states the pain seems to go up into the base of the throat when she bends over.  No alleviating factors.  No intervention prior to arrival.  She denies lightheadedness, dizziness, visual disturbance, numbness, weakness, passing out, dyspnea, nausea, vomiting, diaphoresis, or abdominal pain. Denies leg pain/swelling, hemoptysis, recent surgery/trauma, recent long travel, hormone use, personal hx of cancer, or hx of DVT/PE.  She states she has a history of migraines and this morning's headaches was much less severe.      HPI  Past Medical History:  Diagnosis Date  . Cardiomegaly   . GERD (gastroesophageal reflux disease)   . Hypertension   . Hypothyroidism   . Sinus tachycardia     Patient Active Problem List   Diagnosis Date Noted  . Thrush 11/03/2012  . Sjogren's disease ([place]) 01/22/2012  . GERD (gastroesophageal reflux disease) 03/19/2011  . Hypertension 03/19/2011  . Raynaud's disease 03/19/2011  . Cardiomegaly 02/26/2010  . Hypothyroidism  02/26/2010  . Wrist fracture, left 02/26/2010  . Liver hemangioma 02/26/2010  . Migraine 02/26/2010  . Esophageal stricture 02/26/2010  . Weight loss 02/26/2010  . OTHER CHRONIC SINUSITIS 12/20/2007  . Rosacea 12/20/2007    Past Surgical History:  Procedure Laterality Date  . HAND TENDON SURGERY  02/09/2007   tendon transfer-Dr. Amedeo Plenty  . NASAL SEPTUM SURGERY     operated on by Dr. Ernesto Rutherford 20 years ago  . OVARIAN CYST REMOVAL     Right Sided Salpingo-oopherectomy  . SALPINGOOPHORECTOMY       OB History   No obstetric history on file.      Home Medications    Prior to Admission medications   Medication Sig Start Date End Date Taking? Authorizing Provider  amLODipine (NORVASC) 2.5 MG tablet Take 1 tablet (2.5 mg total) by mouth daily. 02/19/18   Burchette, Alinda Sierras, MD  cephALEXin (KEFLEX) 500 MG capsule Take 1 capsule (500 mg total) by mouth 3 (three) times daily. 10/04/18   Charlann Lange, PA-C  fluticasone (FLONASE) 50 MCG/ACT nasal spray Place into both nostrils daily.    [provider]  levothyroxine (SYNTHROID) 100 MCG tablet Take 1 tablet (100 mcg total) by mouth daily. 06/28/18   Burchette, Alinda Sierras, MD  pantoprazole (PROTONIX) 40 MG tablet TAKE 1 TABLET BY MOUTH EVERY DAY 08/17/18   Burchette, Alinda Sierras, MD  metoprolol succinate (TOPROL-XL) 25 MG 24 hr tablet 1/2 tablet twice a day 03/17/11 03/19/11  Burnice Logan, MD  mupirocin [facility]) 2 % nasal ointment by  Nasal route 2 (two) times daily. Use one-half of tube in each nostril twice daily for five (5) days. After application, press sides of nose together and gently massage.   03/19/11  [provider]  topiramate (TOPAMAX) 25 MG tablet Take 25 mg by mouth at bedtime.    03/19/11  [provider]    Family History Family History  Problem Relation Age of Onset  . Allergies Other        Grandmother-pt states she takes after grandmother who had multiple allergies  . Cancer Mother        breast  and lung  . Cancer Father        prostate  . Alcohol abuse Maternal Grandmother   . Alcohol abuse Maternal Grandfather     Social History Social History   Tobacco Use  . Smoking status: Never Smoker  . Smokeless tobacco: Never Used  Substance Use Topics  . Alcohol use: No  . Drug use: No     Allergies   Amoxicillin-pot clavulanate, Azithromycin, Celecoxib, Ciprofloxacin, Doxycycline, Erythromycin, Hydrocodone, Hydrocodone-acetaminophen, Levofloxacin, Penicillins, Propoxyphene n-acetaminophen, Rofecoxib, Sulfa antibiotics, and Tetanus toxoid   Review of Systems Review of Systems  Constitutional: Negative for chills, diaphoresis and fever.  HENT: Negative for congestion, ear pain and sore throat.   Eyes: Negative for visual disturbance.  Respiratory: Negative for cough and shortness of breath.   Cardiovascular: Positive for chest pain. Negative for palpitations and leg swelling.  Gastrointestinal: Negative for abdominal pain, nausea and vomiting.  Genitourinary: Negative for dysuria.  Musculoskeletal: Negative for neck stiffness.  Neurological: Positive for headaches (resolved at present). Negative for dizziness, tremors, seizures, syncope, facial asymmetry, speech difficulty, weakness, light-headedness and numbness.  All other systems reviewed and are negative.    Physical Exam Updated Vital Signs BP (!) 154/85 (BP Location: Left Arm)   Pulse 99   Temp 98.4 F (36.9 C) (Oral)   Resp 20   Ht 5\' 4"  (1.626 m)   Wt 71.2 kg   SpO2 100%   BMI 26.95 kg/m   Physical Exam Vitals signs and nursing note reviewed.  Constitutional:      General: She is not in acute distress.    Appearance: She is well-developed. She is not toxic-appearing.  HENT:     Head: Normocephalic and atraumatic.  Eyes:     General:        Right eye: No discharge.        Left eye: No discharge.     Conjunctiva/sclera: Conjunctivae normal.  Neck:     Musculoskeletal: Neck supple.   Cardiovascular:     Rate and Rhythm: Normal rate and regular rhythm.     Pulses:          Radial pulses are 2+ on the right side and 2+ on the left side.  Pulmonary:     Effort: Pulmonary effort is normal. No respiratory distress.     Breath sounds: Normal breath sounds. No wheezing, rhonchi or rales.  Chest:     Chest wall: Tenderness (To anterior chest wall which patient states reproduces her chest pain.  No overlying skin changes/rashes, no palpable crepitus.) present.  Abdominal:     General: There is no distension.     Palpations: Abdomen is soft.     Tenderness: There is no abdominal tenderness.  Musculoskeletal:     Right lower leg: She exhibits no tenderness. No edema.     Left lower leg: She exhibits no tenderness. No edema.  Skin:    General: Skin is warm and dry.     Findings: No rash.  Neurological:     Mental Status: She is alert.     Comments: Clear speech.  CN III through XII grossly intact.  Sensation grossly intact bilateral upper and lower extremities.  5 out of 5 symmetric grip strength.  5 out of 5 strength with plantar dorsiflexion bilaterally.  Negative pronator drift.  Normal finger-to-nose.  Ambulatory with steady gait.  Psychiatric:        Behavior: Behavior normal.      ED Treatments / Results  Labs (all labs ordered are listed, but only abnormal results are displayed) Labs Reviewed  BASIC METABOLIC PANEL - Abnormal; Notable for the following components:      Result Value   Glucose, Bld 112 (*)    All other components within normal limits  CBC  TROPONIN I (HIGH SENSITIVITY)  TROPONIN I (HIGH SENSITIVITY)    EKG EKG Interpretation  Date/Time:  Saturday December 11 2018 13:14:40 EST Ventricular Rate:  93 PR Interval:    QRS Duration: 115 QT Interval:  369 QTC Calculation: 459 R Axis:   -73 Text Interpretation: Sinus rhythm Left atrial enlargement LVH with IVCD, LAD and secondary repol abnrm No significant change since last tracing Confirmed  by Wandra Arthurs [phone]) on 12/11/2018 5:48:58 PM   Radiology Dg Chest 2 View  Result Date: 12/11/2018 CLINICAL DATA:  Chest pain, headache EXAM: CHEST - 2 VIEW COMPARISON:  04/20/2017 FINDINGS: Lungs are clear.  No pleural effusion or pneumothorax. The heart is normal in size. Visualized osseous structures are within normal limits. IMPRESSION: Normal chest radiographs. Electronically Signed   By: Julian Hy M.D.   On: 12/11/2018 13:52    Procedures Procedures (including critical care time)  Medications Ordered in ED Medications  sodium chloride flush (NS) 0.9 % injection 3 mL (0 mLs Intravenous Hold 12/11/18 1726)    Initial Impression / Assessment and Plan / ED Course  I have reviewed the triage vital signs and the nursing notes.  Pertinent labs & imaging results that were available during my care of the patient were reviewed by me and considered in my medical decision making (see chart for details).   Patient presents to the emergency department with complaints of chest pain that began this morning as well as a headache that is resolved at present.  She is nontoxic-appearing, resting comfortably, vitals WNL with exception of mildly elevated blood pressure, doubt HTN emergency.  Her chest pain is reproducible with anterior chest wall palpation.  Her headache is resolved and she has no focal neurologic deficits.  Work-up per triage has been reviewed: CBC: No leukocytosis or anemia BMP: No electrolyte derangement Troponins: 13, 14 EKG: No significant change from prior Chest x-ray: Negative radiographs.  EKG without significant change from prior, troponins without significant elevation or change with delta, doubt ACS.  She is low risk [place], doubt PE.  No widened mediastinum on imaging, symmetric pulses, doubt dissection.  Her chest x-ray is without pneumothorax or pneumonia.  Her labs do not show critical anemia.  Her chest pain is reproducible with anterior chest wall palpation, no  specific injury or change in activity, however do query musculoskeletal, unclear definitive etiology, recommended Tylenol per over-the-counter dosing.  Her headache is resolved and she does not have any focal neurologic deficits.  She appears appropriate  for discharge home. I discussed results, treatment plan, need for follow-up, and return precautions with the patient.  Provided opportunity for questions, patient confirmed understanding and is in agreement with plan.   This is a shared visit with supervising physician Dr. Darl Householder who has independently evaluated patient & provided guidance in evaluation/management/disposition, in agreement with care   Final Clinical Impressions(s) / ED Diagnoses   Final diagnoses:  Chest pain, unspecified type    ED Discharge Orders    None       Amaryllis Dyke, PA-C 12/11/18 1829    Drenda Freeze, MD 12/12/18 [phone]

## 2018-12-11 NOTE — ED Notes (Signed)
Patient given 6 oz water

## 2018-12-11 NOTE — Discharge Instructions (Addendum)
You were seen in the emergency department today for chest pain. Your work-up in the emergency department has been overall reassuring. Your labs have been fairly normal and or similar to previous blood work you have had done. Your EKG and the enzyme we use to check your heart did not show an acute heart attack at this time. Your chest x-ray was normal.   Please try taking Tylenol per over-the-counter dosing to help with discomfort.  We would like you to follow up closely with your primary care provider and/or the cardiologist provided in your discharge instructions within 1-3 days. Return to the ER immediately should you experience any new or worsening symptoms including but not limited to return of pain, worsened pain, vomiting, shortness of breath, dizziness, lightheadedness, passing out, or any other concerns that you may have.

## 2018-12-11 NOTE — ED Triage Notes (Signed)
Pt reports that she had a severe HA and drank some coffee and was resolved, with associated Central chest pain that is a  dull a radiating to her throat 7/10. Pain worsens with movement. Pt has hx of gerd and does state that she was raking leaves yesterday.  Pt reports son had covid 2 weeks ago.

## 2018-12-11 NOTE — ED Notes (Signed)
Patient successfully completed PO challenge 

## 2018-12-13 ENCOUNTER — Encounter: Payer: Self-pay | Admitting: Family Medicine

## 2018-12-13 ENCOUNTER — Other Ambulatory Visit: Payer: Self-pay

## 2018-12-13 ENCOUNTER — Ambulatory Visit (INDEPENDENT_AMBULATORY_CARE_PROVIDER_SITE_OTHER): Payer: Medicare Other | Admitting: Family Medicine

## 2018-12-13 VITALS — BP 118/74 | HR 100 | Temp 98.1°F | Ht 64.5 in | Wt 163.0 lb

## 2018-12-13 DIAGNOSIS — I10 Essential (primary) hypertension: Secondary | ICD-10-CM | POA: Diagnosis not present

## 2018-12-13 DIAGNOSIS — M940 Chondrocostal junction syndrome [Tietze]: Secondary | ICD-10-CM

## 2018-12-13 MED ORDER — PREDNISONE 10 MG PO TABS: ORAL_TABLET | ORAL | 0 refills | Status: DC

## 2018-12-13 NOTE — Patient Instructions (Signed)
Costochondritis  Costochondritis is swelling and irritation (inflammation) of the tissue (cartilage) that connects your ribs to your breastbone (sternum). This causes pain in the front of your chest. The pain usually starts gradually and involves more than one rib. What are the causes? The exact cause of this condition is not always known. It results from stress on the cartilage where your ribs attach to your sternum. The cause of this stress could be:  Chest injury (trauma).  Exercise or activity, such as lifting.  Severe coughing. What increases the risk? You may be at higher risk for this condition if you:  Are female.  Are 30?71 years old.  Recently started a new exercise or work activity.  Have low levels of vitamin D.  Have a condition that makes you cough frequently. What are the signs or symptoms? The main symptom of this condition is chest pain. The pain:  Usually starts gradually and can be sharp or dull.  Gets worse with deep breathing, coughing, or exercise.  Gets better with rest.  May be worse when you press on the sternum-rib connection (tenderness). How is this diagnosed? This condition is diagnosed based on your symptoms, medical history, and a physical exam. Your health care provider will check for tenderness when pressing on your sternum. This is the most important finding. You may also have tests to rule out other causes of chest pain. These may include:  A chest X-ray to check for lung problems.  An electrocardiogram (ECG) to see if you have a heart problem that could be causing the pain.  An imaging scan to rule out a chest or rib fracture. How is this treated? This condition usually goes away on its own over time. Your health care provider may prescribe an NSAID to reduce pain and inflammation. Your health care provider may also suggest that you:  Rest and avoid activities that make pain worse.  Apply heat or cold to the area to reduce pain and  inflammation.  Do exercises to stretch your chest muscles. If these treatments do not help, your health care provider may inject a numbing medicine at the sternum-rib connection to help relieve the pain. Follow these instructions at home:  Avoid activities that make pain worse. This includes any activities that use chest, abdominal, and side muscles.  If directed, put ice on the painful area: ? Put ice in a plastic bag. ? Place a towel between your skin and the bag. ? Leave the ice on for 20 minutes, 2-3 times a day.  If directed, apply heat to the affected area as often as told by your health care provider. Use the heat source that your health care provider recommends, such as a moist heat pack or a heating pad. ? Place a towel between your skin and the heat source. ? Leave the heat on for 20-30 minutes. ? Remove the heat if your skin turns bright red. This is especially important if you are unable to feel pain, heat, or cold. You may have a greater risk of getting burned.  Take over-the-counter and prescription medicines only as told by your health care provider.  Return to your normal activities as told by your health care provider. Ask your health care provider what activities are safe for you.  Keep all follow-up visits as told by your health care provider. This is important. Contact a health care provider if:  You have chills or a fever.  Your pain does not go away or it gets   worse.  You have a cough that does not go away (is persistent). Get help right away if:  You have shortness of breath. This information is not intended to replace advice given to you by your health care provider. Make sure you discuss any questions you have with your health care provider. Document Released: 10/02/2004 Document Revised: 01/07/2017 Document Reviewed: 04/18/2015 Elsevier Patient Education  2020 [employer].

## 2018-12-13 NOTE — Progress Notes (Signed)
Subjective:     Patient ID: Christy Moore, female   DOB: Sep 28, 1945, 73 y.o.   MRN: [id number]  HPI   Christy Moore is seen for ER follow-up.  She has history of hypertension, hypothyroidism, Sjogren's syndrome, GERD, migraines and was evaluated recently for chest pain.  She states that last Saturday morning she woke up with headache which improved after drinking some coffee.  She then later that morning developed some chest pain "like a bruise ".  This was somewhat diffuse anterior chest.  Nonexertional.  She described 8 out of 10 in severity at its worst.  Her pain was worse with position change and movement.  There is no radiation into the left upper extremity.  She had no dyspnea, cough, or fever.  She is a non-smoker.  She went to the ER and had evaluation which included chest x-ray which is unremarkable.  EKG showed LVH type changes which are unchanged from prior tracing.  Troponin is negative x2.  CBC unremarkable.  She took some Tylenol without improvement and then tried some Advil which has provided mild relief.  She does not recall any specific injury but did do some yard work Friday with raking leaves.  She is on low-dose amlodipine for hypertension.  Her blood pressure was slightly elevated in ER but normal today.  She is on thyroid replacement.  GERD symptoms are controlled with PPI  Past Medical History:  Diagnosis Date  . Cardiomegaly   . GERD (gastroesophageal reflux disease)   . Hypertension   . Hypothyroidism   . Sinus tachycardia    Past Surgical History:  Procedure Laterality Date  . HAND TENDON SURGERY  02/09/2007   tendon transfer-Dr. Amedeo Plenty  . NASAL SEPTUM SURGERY     operated on by Dr. Ernesto Rutherford 20 years ago  . OVARIAN CYST REMOVAL     Right Sided Salpingo-oopherectomy  . SALPINGOOPHORECTOMY      reports that she has never smoked. She has never used smokeless tobacco. She reports that she does not drink alcohol or use drugs. family history includes Alcohol abuse in  her maternal grandfather and maternal grandmother; Allergies in an other family member; Cancer in her father and mother. Allergies  Allergen Reactions  . Amoxicillin-Pot Clavulanate     REACTION: nausea  . Azithromycin     REACTION: nausea, Gi  . Celecoxib     REACTION: nausea  . Ciprofloxacin     REACTION: nausea  . Doxycycline     REACTION: gi upset  . Erythromycin     REACTION: nause, GI  . Hydrocodone     REACTION: nausea  . Hydrocodone-Acetaminophen     REACTION: GI upset  . Levofloxacin     REACTION: she claims renal failiure  . Penicillins   . Propoxyphene N-Acetaminophen   . Rofecoxib     REACTION: nausea  . Sulfa Antibiotics   . Tetanus Toxoid        Review of Systems  Constitutional: Negative for chills and fever.  HENT: Negative for trouble swallowing.   Respiratory: Negative for cough and shortness of breath.   Cardiovascular: Negative for palpitations and leg swelling.  Gastrointestinal: Negative for abdominal pain, nausea and vomiting.  Genitourinary: Negative for dysuria.  Skin: Negative for rash.  Neurological: Negative for dizziness.       Objective:   Physical Exam Vitals signs reviewed.  Constitutional:      Appearance: Normal appearance.  Neck:     Musculoskeletal: Neck supple.  Cardiovascular:  Rate and Rhythm: Normal rate and regular rhythm.  Pulmonary:     Effort: Pulmonary effort is normal.     Breath sounds: Normal breath sounds. No wheezing or rales.     Comments: She is very tender to palpation along the costochondral junction bilaterally right and left side Musculoskeletal:     Right lower leg: No edema.     Left lower leg: No edema.  Neurological:     Mental Status: She is alert.        Assessment:     #1 acute costochondritis.  Her pain is consistently worse with movement and palpation.  Pain is reproduced on exam to palpation.  Recent evaluation as above unremarkable.  Poor pain relief with Tylenol  #2  hypertension stable    Plan:     -ER notes and studies were reviewed in detail -Recommend trial of prednisone taper starting at 40 mg daily.  She will take with food -Touch base in a couple days if pain not improving -Avoid any heavy lifting in the meantime  Eulas Post MD [facility] at [facility]

## 2019-01-03 ENCOUNTER — Other Ambulatory Visit: Payer: Self-pay | Admitting: Family Medicine

## 2019-02-02 ENCOUNTER — Ambulatory Visit: Payer: Medicare Other

## 2019-02-11 ENCOUNTER — Ambulatory Visit: Payer: Medicare Other | Attending: Internal Medicine

## 2019-02-11 DIAGNOSIS — Z23 Encounter for immunization: Secondary | ICD-10-CM | POA: Insufficient documentation

## 2019-02-11 NOTE — Progress Notes (Signed)
° °  Covid-19 Vaccination Clinic  Name:  Christy Moore    MRN: [id number] DOB: 1945/08/30  02/11/2019  [name] was observed post Covid-19 immunization for 30 minutes based on pre-vaccination screening without incidence. She was provided with Vaccine Information Sheet and instruction to access the V-Safe system.   [name] was instructed to call 911 with any severe reactions post vaccine:  Difficulty breathing   Swelling of your face and throat   A fast heartbeat   A bad rash all over your body   Dizziness and weakness    Immunizations Administered    Name Date Dose VIS Date Route   Pfizer COVID-19 Vaccine 02/11/2019  9:53 AM 0.3 mL 12/17/2018 Intramuscular   Manufacturer: [place]   Lot: [id number]   [place]: [id number]

## 2019-02-23 ENCOUNTER — Ambulatory Visit: Payer: Medicare Other

## 2019-02-25 ENCOUNTER — Other Ambulatory Visit: Payer: Self-pay

## 2019-02-28 ENCOUNTER — Ambulatory Visit (INDEPENDENT_AMBULATORY_CARE_PROVIDER_SITE_OTHER): Payer: Medicare Other | Admitting: Family Medicine

## 2019-02-28 ENCOUNTER — Encounter: Payer: Self-pay | Admitting: Family Medicine

## 2019-02-28 ENCOUNTER — Other Ambulatory Visit: Payer: Self-pay

## 2019-02-28 VITALS — BP 120/68 | HR 87 | Temp 98.1°F | Wt 161.0 lb

## 2019-02-28 DIAGNOSIS — R1310 Dysphagia, unspecified: Secondary | ICD-10-CM | POA: Diagnosis not present

## 2019-02-28 DIAGNOSIS — K219 Gastro-esophageal reflux disease without esophagitis: Secondary | ICD-10-CM | POA: Diagnosis not present

## 2019-02-28 NOTE — Patient Instructions (Addendum)
Dysphagia  Dysphagia is trouble swallowing. This condition occurs when solids and liquids stick in a person's throat on the way down to the stomach, or when food takes longer to get to the stomach than usual. You may have problems swallowing food, liquids, or both. You may also have pain while trying to swallow. It may take you more time and effort to swallow something. What are the causes? This condition may be caused by:  Muscle problems. They may make it difficult for you to move food and liquids through the esophagus, which is the tube that connects your mouth to your stomach.  Blockages. You may have ulcers, scar tissue, or inflammation that blocks the normal passage of food and liquids. Causes of these problems include: ? Acid reflux from your stomach into your esophagus (gastroesophageal reflux). ? Infections. ? Radiation treatment for cancer. ? Medicines taken without enough fluids to wash them down into your stomach.  Stroke. This can affect the nerves and make it difficult to swallow.  Nerve problems. These prevent signals from being sent to the muscles of your esophagus to squeeze (contract) and move what you swallow down to your stomach.  Globus pharyngeus. This is a common problem that involves a feeling like something is stuck in your throat or a sense of trouble with swallowing, even though nothing is wrong with the swallowing passages.  Certain conditions, such as cerebral palsy or Parkinson's disease. What are the signs or symptoms? Common symptoms of this condition include:  A feeling that solids or liquids are stuck in your throat on the way down to the stomach.  Pain while swallowing.  Coughing or gagging while trying to swallow. Other symptoms include:  Food moving back from your stomach to your mouth (regurgitation).  Noises coming from your throat.  Chest discomfort with swallowing.  A feeling of fullness when swallowing.  Drooling, especially when the  throat is blocked.  Heartburn. How is this diagnosed? This condition may be diagnosed by:  Barium X-ray. In this test, you will swallow a white liquid that sticks to the inside of your esophagus. X-ray images are then taken.  Endoscopy. In this test, a flexible telescope is inserted down your throat to look at your esophagus and your stomach.  CT scans and an MRI. How is this treated? Treatment for dysphagia depends on the cause of this condition, such as:  If the dysphagia is caused by acid reflux or infection, medicines may be used. They may include antibiotics and heartburn medicines.  If the dysphagia is caused by problems with the muscles, swallowing therapy may be used to help you strengthen your swallowing muscles. You may have to do specific exercises to strengthen the muscles or stretch them.  If the dysphagia is caused by a blockage or mass, procedures to remove the blockage may be done. You may need surgery and a feeding tube. You may need to make diet changes. Ask your health care provider for specific instructions. Follow these instructions at home: Medicines  Take over-the-counter and prescription medicines only as told by your health care provider.  If you were prescribed an antibiotic medicine, take it as told by your health care provider. Do not stop taking the antibiotic even if you start to feel better. Eating and drinking   Follow any diet changes as told by your health care provider.  Work with a diet and nutrition specialist (dietitian) to create an eating plan that will help you get the nutrients you need in   order to stay healthy.  Eat soft foods that are easier to swallow.  Cut your food into small pieces and eat slowly. Take small bites.  Eat and drink only when you are sitting upright.  Do not drink alcohol or caffeine. If you need help quitting, ask your health care provider. General instructions  Check your weight every day to make sure you are  not losing weight.  Do not use any products that contain nicotine or tobacco, such as cigarettes, e-cigarettes, and chewing tobacco. If you need help quitting, ask your health care provider.  Keep all follow-up visits as told by your health care provider. This is important. Contact a health care provider if you:  Lose weight because you cannot swallow.  Cough when you drink liquids.  Cough up partially digested food. Get help right away if you:  Cannot swallow your saliva.  Have shortness of breath, a fever, or both.  Have a hoarse voice and also have trouble swallowing. Summary  Dysphagia is trouble swallowing. This condition occurs when solids and liquids stick in a person's throat on the way down to the stomach. You may cough or gag while trying to swallow.  Dysphagia has many possible causes.  Treatment for dysphagia depends on the cause of the condition.  Keep all follow-up visits as told by your health care provider. This is important. This information is not intended to replace advice given to you by your health care provider. Make sure you discuss any questions you have with your health care provider. Document Revised: 05/19/2018 Document Reviewed: 05/19/2018 Elsevier Patient Education  [place].  Make sure you are taking the Protonix 30 minutes prior to a bigger meal of the day.

## 2019-02-28 NOTE — Progress Notes (Signed)
  Subjective:     Patient ID: Christy Moore, female   DOB: 15-May-1945, 74 y.o.   MRN: [id number]  HPI Christy Moore is seen with several week history of some intermittent epigastric burning symptoms.  She does have history of GERD and takes Protonix 40 mg regularly but she has been taking this at bedtime and not prior to meals.  She also has history of previous esophageal stricture which was dilated way back in 2004.  She has had some recent progressive dysphagia to some solid foods.  No pain with swallowing.  No appetite or weight changes.  She has been supplementing some with Pepcid for her dyspepsia symptoms and that seems to help.  No stool changes.  No melena.  She has noted that certain foods like fatty foods and acidic foods exacerbate her symptoms.  She has increased epigastric symptoms with things like bending over.  Wt Readings from Last 3 Encounters:  02/28/19 161 lb (73 kg)  12/13/18 163 lb (73.9 kg)  12/11/18 157 lb (71.2 kg)     Review of Systems  Constitutional: Negative for appetite change, chills, fever and unexpected weight change.  Respiratory: Negative for cough and shortness of breath.   Cardiovascular: Negative for chest pain.  Gastrointestinal: Positive for abdominal pain. Negative for blood in stool, constipation, diarrhea, nausea and vomiting.  Genitourinary: Negative for dysuria.  Neurological: Negative for dizziness and weakness.       Objective:   Physical Exam Vitals reviewed.  Constitutional:      Appearance: Normal appearance.  Cardiovascular:     Rate and Rhythm: Normal rate and regular rhythm.  Pulmonary:     Effort: Pulmonary effort is normal.     Breath sounds: Normal breath sounds.  Abdominal:     General: Bowel sounds are normal.     Palpations: Abdomen is soft. There is no mass.     Tenderness: There is abdominal tenderness. There is no guarding or rebound.     Hernia: No hernia is present.     Comments: She has some mild tenderness in the  epigastric region.  No masses palpated.  Neurological:     Mental Status: She is alert.        Assessment:     Christy Moore has longstanding history of GERD and remote history of esophageal stricture.  She presents with several week history of increased epigastric burning not controlled with Protonix.  She also relates some return of dysphagia symptoms with solid foods.  She has not describing any red flag symptoms such as appetite change or weight loss    Plan:     -We recommend she take her Protonix 30 minutes prior to biggest meal the day rather than nightly (qhs) -Continue supplement with Pepcid 20 mg once or twice daily as needed -Set up GI referral for further evaluation of her recurrent dysphagia.  Suspect she may need repeat EGD -Discussed dietary modification with GERD and she is well aware of current triggers  Eulas Post MD [facility] at [facility]

## 2019-03-06 ENCOUNTER — Encounter: Payer: Self-pay | Admitting: Family Medicine

## 2019-03-08 ENCOUNTER — Ambulatory Visit: Payer: Medicare Other | Attending: Internal Medicine

## 2019-03-08 DIAGNOSIS — Z23 Encounter for immunization: Secondary | ICD-10-CM | POA: Insufficient documentation

## 2019-03-08 NOTE — Progress Notes (Signed)
   Covid-19 Vaccination Clinic  Name:  Christy Moore    MRN: [id number] DOB: 1945/12/17  03/08/2019  [name] was observed post Covid-19 immunization for 15 minutes without incident. She was provided with Vaccine Information Sheet and instruction to access the V-Safe system.   [name] was instructed to call 911 with any severe reactions post vaccine: Marland Kitchen Difficulty breathing  . Swelling of face and throat  . A fast heartbeat  . A bad rash all over body  . Dizziness and weakness   Immunizations Administered    Name Date Dose VIS Date Route   Pfizer COVID-19 Vaccine 03/08/2019  9:52 AM 0.3 mL 12/17/2018 Intramuscular   Manufacturer: [place]   Lot: [id number]   [place]: [id number]

## 2019-03-09 ENCOUNTER — Ambulatory Visit: Payer: Medicare Other

## 2019-03-25 DIAGNOSIS — K222 Esophageal obstruction: Secondary | ICD-10-CM | POA: Diagnosis not present

## 2019-03-25 DIAGNOSIS — K219 Gastro-esophageal reflux disease without esophagitis: Secondary | ICD-10-CM | POA: Diagnosis not present

## 2019-03-25 DIAGNOSIS — R1013 Epigastric pain: Secondary | ICD-10-CM | POA: Diagnosis not present

## 2019-03-25 DIAGNOSIS — Z1211 Encounter for screening for malignant neoplasm of colon: Secondary | ICD-10-CM | POA: Diagnosis not present

## 2019-04-20 DIAGNOSIS — D3132 Benign neoplasm of left choroid: Secondary | ICD-10-CM | POA: Diagnosis not present

## 2019-04-20 DIAGNOSIS — H524 Presbyopia: Secondary | ICD-10-CM | POA: Diagnosis not present

## 2019-04-20 DIAGNOSIS — H52223 Regular astigmatism, bilateral: Secondary | ICD-10-CM | POA: Diagnosis not present

## 2019-04-20 DIAGNOSIS — H25011 Cortical age-related cataract, right eye: Secondary | ICD-10-CM | POA: Diagnosis not present

## 2019-04-20 DIAGNOSIS — H43813 Vitreous degeneration, bilateral: Secondary | ICD-10-CM | POA: Diagnosis not present

## 2019-04-20 DIAGNOSIS — H2513 Age-related nuclear cataract, bilateral: Secondary | ICD-10-CM | POA: Diagnosis not present

## 2019-04-20 DIAGNOSIS — H5203 Hypermetropia, bilateral: Secondary | ICD-10-CM | POA: Diagnosis not present

## 2019-06-15 DIAGNOSIS — M542 Cervicalgia: Secondary | ICD-10-CM | POA: Diagnosis not present

## 2019-06-15 DIAGNOSIS — M5030 Other cervical disc degeneration, unspecified cervical region: Secondary | ICD-10-CM | POA: Diagnosis not present

## 2019-06-15 DIAGNOSIS — M25511 Pain in right shoulder: Secondary | ICD-10-CM | POA: Diagnosis not present

## 2019-07-02 ENCOUNTER — Other Ambulatory Visit: Payer: Self-pay | Admitting: Family Medicine

## 2019-08-22 ENCOUNTER — Other Ambulatory Visit: Payer: Self-pay | Admitting: Family Medicine

## 2019-09-26 ENCOUNTER — Other Ambulatory Visit: Payer: Self-pay | Admitting: Family Medicine

## 2019-09-30 ENCOUNTER — Telehealth: Payer: Self-pay | Admitting: Family Medicine

## 2019-09-30 NOTE — Progress Notes (Signed)
  Chronic Care Management   Outreach Note  09/30/2019 Name: SHYLYN YOUNCE MRN: [id number] DOB: [birth year]  Referred by: Eulas Post, MD Reason for referral : No chief complaint on file.   An unsuccessful telephone outreach was attempted today. The patient was referred to the pharmacist for assistance with care management and care coordination.   Follow Up Plan:   Carley Perdue [employer] Scheduler

## 2019-10-03 ENCOUNTER — Other Ambulatory Visit: Payer: Self-pay

## 2019-10-03 ENCOUNTER — Ambulatory Visit (INDEPENDENT_AMBULATORY_CARE_PROVIDER_SITE_OTHER): Payer: Medicare Other

## 2019-10-03 DIAGNOSIS — Z23 Encounter for immunization: Secondary | ICD-10-CM

## 2019-10-03 NOTE — Progress Notes (Signed)
Patient given flu vaccine in right deltoid per orders of Dr. [name].  Patient tolerated the injection well.

## 2019-10-04 DIAGNOSIS — Z23 Encounter for immunization: Secondary | ICD-10-CM | POA: Diagnosis not present

## 2019-10-10 ENCOUNTER — Telehealth: Payer: Self-pay | Admitting: Family Medicine

## 2019-10-10 NOTE — Progress Notes (Signed)
  Chronic Care Management   Note  10/10/2019 Name: Christy Moore MRN: [id number] DOB: 06-23-1945  Christy Moore is a 74 y.o. year old female who is a primary care patient of Burchette, Alinda Sierras, MD. I reached out to [employer] by phone today in response to a referral sent by Christy Moore's PCP, Burchette, Alinda Sierras, MD.   Christy Moore was given information about Chronic Care Management services today including:  1. CCM service includes personalized support from designated clinical staff supervised by her physician, including individualized plan of care and coordination with other care providers 2. 24/7 contact phone numbers for assistance for urgent and routine care needs. 3. Service will only be billed when office clinical staff spend 20 minutes or more in a month to coordinate care. 4. Only one practitioner may furnish and bill the service in a calendar month. 5. The patient may stop CCM services at any time (effective at the end of the month) by phone call to the office staff.   Patient agreed to services and verbal consent obtained.   Follow up plan:   Christy Moore [employer] Scheduler

## 2019-10-17 ENCOUNTER — Other Ambulatory Visit: Payer: Self-pay

## 2019-10-17 ENCOUNTER — Ambulatory Visit (INDEPENDENT_AMBULATORY_CARE_PROVIDER_SITE_OTHER): Payer: Medicare Other

## 2019-10-17 DIAGNOSIS — Z0000 Encounter for general adult medical examination without abnormal findings: Secondary | ICD-10-CM

## 2019-10-17 NOTE — Progress Notes (Signed)
Subjective:   Christy Moore is a 74 y.o. female who presents for an Initial Medicare Annual Wellness Visit.  I connected with Hazelgrace Hupfer today by telephone and verified that I am speaking with the correct person using two identifiers. Location patient: home Location provider: work Persons participating in the virtual visit: patient, provider.   I discussed the limitations, risks, security and privacy concerns of performing an evaluation and management service by telephone and the availability of in person appointments. I also discussed with the patient that there may be a patient responsible charge related to this service. The patient expressed understanding and verbally consented to this telephonic visit.    Interactive audio and video telecommunications were attempted between this provider and patient, however failed, due to patient having technical difficulties OR patient did not have access to video capability.  We continued and completed visit with audio only.     Review of Systems    N/A  Cardiac Risk Factors include: advanced age (>54men, >33 women);hypertension;dyslipidemia     Objective:    Today's Vitals   There is no height or weight on file to calculate BMI.  Advanced Directives 10/17/2019 12/11/2018 10/03/2018 05/27/2017 05/23/2016  Does Patient Have a Medical Advance Directive? Yes No No No No  Type of Paramedic of [place];Living will - - - -  Does patient want to make changes to medical advance directive? No - Patient declined - - - -  Copy of [place] in Chart? No - copy requested - - - -  Would patient like information on creating a medical advance directive? - - No - Patient declined - -    Current Medications (verified) Outpatient Encounter Medications as of 10/17/2019  Medication Sig  . amLODipine (NORVASC) 2.5 MG tablet TAKE 1 TABLET BY MOUTH EVERY DAY  . famotidine (PEPCID) 40 MG tablet TAKE 1 TABLET BY  MOUTH EVERYDAY AT BEDTIME  . fluticasone (FLONASE) 50 MCG/ACT nasal spray Place 2 sprays into both nostrils daily as needed for allergies.   Marland Kitchen levothyroxine (SYNTHROID) 100 MCG tablet TAKE 1 TABLET (100 MCG TOTAL) BY MOUTH DAILY. NEEDS APPT WITH LABS CALL [phone]  . famotidine (PEPCID) 40 MG tablet Take 40 mg by mouth at bedtime.  . pantoprazole (PROTONIX) 40 MG tablet TAKE 1 TABLET BY MOUTH EVERY DAY  . [DISCONTINUED] metoprolol succinate (TOPROL-XL) 25 MG 24 hr tablet 1/2 tablet twice a day  . [DISCONTINUED] mupirocin (BACTROBAN) 2 % nasal ointment by Nasal route 2 (two) times daily. Use one-half of tube in each nostril twice daily for five (5) days. After application, press sides of nose together and gently massage.   . [DISCONTINUED] predniSONE (DELTASONE) 10 MG tablet Taper as follows: 4-4-4-4-3-3-2-2-1-1  . [DISCONTINUED] topiramate (TOPAMAX) 25 MG tablet Take 25 mg by mouth at bedtime.     No facility-administered encounter medications on file as of 10/17/2019.    Allergies (verified) Amoxicillin-pot clavulanate, Azithromycin, Celecoxib, Ciprofloxacin, Doxycycline, Erythromycin, Hydrocodone, Hydrocodone-acetaminophen, Levofloxacin, Penicillins, Propoxyphene n-acetaminophen, Rofecoxib, Sulfa antibiotics, and Tetanus toxoid   History: Past Medical History:  Diagnosis Date  . Cardiomegaly   . GERD (gastroesophageal reflux disease)   . Hypertension   . Hypothyroidism   . Sinus tachycardia    Past Surgical History:  Procedure Laterality Date  . HAND TENDON SURGERY  02/09/2007   tendon transfer-Dr. Amedeo Plenty  . NASAL SEPTUM SURGERY     operated on by Dr. Ernesto Rutherford 20 years ago  . OVARIAN CYST REMOVAL  Right Sided Salpingo-oopherectomy  . SALPINGOOPHORECTOMY     Family History  Problem Relation Age of Onset  . Allergies Other        Grandmother-pt states she takes after grandmother who had multiple allergies  . Cancer Mother        breast and lung  . Cancer Father         prostate  . Alcohol abuse Maternal Grandmother   . Alcohol abuse Maternal Grandfather    Social History   Socioeconomic History  . Marital status: Married    Spouse name: Not on file  . Number of children: Not on file  . Years of education: Not on file  . Highest education level: Not on file  Occupational History  . Not on file  Tobacco Use  . Smoking status: Never Smoker  . Smokeless tobacco: Never Used  Vaping Use  . Vaping Use: Never used  Substance and Sexual Activity  . Alcohol use: No  . Drug use: No  . Sexual activity: Not on file  Other Topics Concern  . Not on file  Social History Narrative  . Not on file   Social Determinants of Health   Financial Resource Strain: Low Risk   . Difficulty of Paying Living Expenses: Not hard at all  Food Insecurity: No Food Insecurity  . Worried About Charity fundraiser in the Last Year: Never true  . Ran Out of Food in the Last Year: Never true  Transportation Needs: No Transportation Needs  . Lack of Transportation (Medical): No  . Lack of Transportation (Non-Medical): No  Physical Activity: Inactive  . Days of Exercise per Week: 0 days  . Minutes of Exercise per Session: 0 min  Stress: No Stress Concern Present  . Feeling of Stress : Not at all  Social Connections: Moderately Isolated  . Frequency of Communication with Friends and Family: Once a week  . Frequency of Social Gatherings with Friends and Family: Once a week  . Attends Religious Services: More than 4 times per year  . Active Member of Clubs or Organizations: No  . Attends Archivist Meetings: Never  . Marital Status: Married    Tobacco Counseling Counseling given: Not Answered   Clinical Intake:  Pre-visit preparation completed: Yes        Nutritional Risks: None Diabetes: No  How often do you need to have someone help you when you read instructions, pamphlets, or other written materials from your doctor or pharmacy?: 1 - Never What  is the last grade level you completed in school?: 12th Grade  Diabetic?No  Interpreter Needed?: No  Information entered by :: [place] of Daily Living In your present state of health, do you have any difficulty performing the following activities: 10/17/2019  Hearing? Y  Comment has issues with left ear crackling and has vertigo in the mornings  Vision? N  Difficulty concentrating or making decisions? N  Walking or climbing stairs? N  Dressing or bathing? N  Doing errands, shopping? N  Preparing Food and eating ? N  Using the Toilet? N  In the past six months, have you accidently leaked urine? Y  Comment has had a few stress incontinence with sneezing maybe once or twice this year  Do you have problems with loss of bowel control? N  Managing your Medications? N  Managing your Finances? N  Housekeeping or managing your Housekeeping? N  Some recent data might be hidden  Patient Care Team: Eulas Post, MD as PCP - General (Family Medicine) Viona Gilmore, [facility] as Pharmacist (Pharmacist)  Indicate any recent Medical Services you may have received from other than [facility] providers in the past year (date may be approximate).     Assessment:   This is a routine wellness examination for Christy Moore.  Hearing/Vision screen  Hearing Screening   125Hz  250Hz  500Hz  1000Hz  2000Hz  3000Hz  4000Hz  6000Hz  8000Hz   Right ear:           Left ear:           Vision Screening Comments: Patient states gets eyes examined once per year   Dietary issues and exercise activities discussed: Current Exercise Habits: The patient does not participate in regular exercise at present, Exercise limited by: None identified  Goals    . Patient Stated     Celebrate 50th Anniversary!  Continue in your garden!    . Weight (lb) < 140 lb (63.5 kg)     Will rotate eating habits More for breakfast and lunch and less of dinner   Health snacks; scrambled egg sandwich; bowl of cereal    Scotch eggs   Start walking more around the neighborhood        Depression Screen PHQ 2/9 Scores 10/17/2019 05/27/2017 05/23/2016 03/24/2016 02/19/2015 02/19/2015 12/05/2013  PHQ - 2 Score 0 6 1 1  0 0 0  PHQ- 9 Score 0 7 - - - - -    Fall Risk Fall Risk  10/17/2019 12/01/2018 05/27/2017 05/23/2016 03/24/2016  Falls in the past year? 0 0 No No No  Comment - Emmi Telephone Survey: data to providers prior to load - - -  Number falls in past yr: 0 - - - -  Injury with Fall? 0 - - - -  Risk for fall due to : No Fall Risks - - - -  Follow up Falls evaluation completed;Falls prevention discussed - - - -    Any stairs in or around the home? Yes  If so, are there any without handrails? No  Home free of loose throw rugs in walkways, pet beds, electrical cords, etc? Yes  Adequate lighting in your home to reduce risk of falls? Yes   ASSISTIVE DEVICES UTILIZED TO PREVENT FALLS:  Life alert? No  Use of a cane, walker or w/c? No  Grab bars in the bathroom? No  Shower chair or bench in shower? No  Elevated toilet seat or a handicapped toilet? Yes     Cognitive Function: Cognition within normal limits based on direct observation MMSE - Mini Mental State Exam 05/27/2017  Not completed: (No Data)        Immunizations Immunization History  Administered Date(s) Administered  . Fluad Quad(high Dose 65+) 09/16/2018, 10/03/2019  . Influenza Split 11/22/2011  . Influenza, High Dose Seasonal PF 09/14/2014, 09/07/2015, 10/07/2016, 10/26/2017, 09/16/2018  . Influenza,inj,Quad PF,6+ Mos 10/05/2013  . Influenza,inj,quad, With Preservative 10/06/2016, 11/02/2017  . Influenza-Unspecified 11/22/2011, 10/05/2013, 09/14/2014, 09/07/2015, 10/06/2016, 10/07/2016, 10/26/2017, 11/02/2017  . PFIZER SARS-COV-2 Vaccination 02/11/2019, 03/08/2019  . Pneumococcal Conjugate-13 10/05/2013  . Pneumococcal Polysaccharide-23 02/19/2015  . Zoster 01/17/2010    TDAP status: Due, Education has been provided  regarding the importance of this vaccine. Advised may receive this vaccine at local pharmacy or Health Dept. Aware to provide a copy of the vaccination record if obtained from local pharmacy or Health Dept. Verbalized acceptance and understanding. Flu Vaccine status: Up to date Pneumococcal vaccine status: Up to date Covid-19 vaccine  status: Completed vaccines  Qualifies for Shingles Vaccine? Yes   Zostavax completed Yes   Shingrix Completed?: No.    Education has been provided regarding the importance of this vaccine. Patient has been advised to call insurance company to determine out of pocket expense if they have not yet received this vaccine. Advised may also receive vaccine at local pharmacy or Health Dept. Verbalized acceptance and understanding.  Screening Tests Health Maintenance  Topic Date Due  . MAMMOGRAM  Never done  . DEXA SCAN  02/18/2012  . COLONOSCOPY  02/22/2019  . TETANUS/TDAP  02/19/2035 (Originally 02/17/1964)  . INFLUENZA VACCINE  Completed  . COVID-19 Vaccine  Completed  . Hepatitis C Screening  Completed  . PNA vac Low Risk Adult  Completed    Health Maintenance  Health Maintenance Due  Topic Date Due  . MAMMOGRAM  Never done  . DEXA SCAN  02/18/2012  . COLONOSCOPY  02/22/2019    Colorectal cancer screening: Completed 02/21/2009. Repeat every 10 years Mammogram Status: Patient declined Bone Density Status: Patient declined  Lung Cancer Screening: (Low Dose CT Chest recommended if Age 73-80 years, 30 pack-year currently smoking OR have quit w/in 15years.) does not qualify.   Lung Cancer Screening Referral: N/A   Additional Screening:  Hepatitis C Screening: does qualify; Completed 05/20/2017  Vision Screening: Recommended annual ophthalmology exams for early detection of glaucoma and other disorders of the eye. Is the patient up to date with their annual eye exam?  Yes  Who is the provider or what is the name of the office in which the patient attends  annual eye exams? Dr. Marica Otter  If pt is not established with a provider, would they like to be referred to a provider to establish care? No .   Dental Screening: Recommended annual dental exams for proper oral hygiene  Community Resource Referral / Chronic Care Management: CRR required this visit?  No   CCM required this visit?  No      Plan:     I have personally reviewed and noted the following in the patient's chart:   . Medical and social history . Use of alcohol, tobacco or illicit drugs  . Current medications and supplements . Functional ability and status . Nutritional status . Physical activity . Advanced directives . List of other physicians . Hospitalizations, surgeries, and ER visits in previous 12 months . Vitals . Screenings to include cognitive, depression, and falls . Referrals and appointments  In addition, I have reviewed and discussed with patient certain preventive protocols, quality metrics, and best practice recommendations. A written personalized care plan for preventive services as well as general preventive health recommendations were provided to patient.     Ofilia Neas, LPN   [id number]   Nurse Notes: None

## 2019-10-17 NOTE — Patient Instructions (Addendum)
Christy Moore , Thank you for taking time to come for your Medicare Wellness Visit. I appreciate your ongoing commitment to your health goals. Please review the following plan we discussed and let me know if I can assist you in the future.   Screening recommendations/referrals: Colonoscopy: Currently due, please let us know when you are ready to get set up for your colonoscopy Mammogram: Currently due, please let us know when you would like [place] to get you scheduled Bone Density: Currently due, please let us know when you would like for [place] to get you scheduled Recommended yearly ophthalmology/optometry visit for glaucoma screening and checkup Recommended yearly dental visit for hygiene and checkup  Vaccinations: Influenza vaccine: Up to date, next due fall 2022 Pneumococcal vaccine: Completed series Tdap vaccine: Not indicated as you have Tetanus listed as and allergy  Shingles vaccine: Currently due for shingrix, please contact your pharmacy to discuss and to receive the vaccine.  Advanced directives: Please bring copies of your advanced medical directives into the office so that we may scan them into your chart.  Conditions/risks identified: None   Next appointment: 11/22/2019 @ 2 PM with Pharmacist    Preventive Care 72 Years and Older, Female Preventive care refers to lifestyle choices and visits with your health care provider that can promote health and wellness. What does preventive care include?  A yearly physical exam. This is also called an annual well check.  Dental exams once or twice a year.  Routine eye exams. Ask your health care provider how often you should have your eyes checked.  Personal lifestyle choices, including:  Daily care of your teeth and gums.  Regular physical activity.  Eating a healthy diet.  Avoiding tobacco and drug use.  Limiting alcohol use.  Practicing safe sex.  Taking low-dose aspirin every day.  Taking vitamin and mineral supplements  as recommended by your health care provider. What happens during an annual well check? The services and screenings done by your health care provider during your annual well check will depend on your age, overall health, lifestyle risk factors, and family history of disease. Counseling  Your health care provider may ask you questions about your:  Alcohol use.  Tobacco use.  Drug use.  Emotional well-being.  Home and relationship well-being.  Sexual activity.  Eating habits.  History of falls.  Memory and ability to understand (cognition).  Work and work Statistician.  Reproductive health. Screening  You may have the following tests or measurements:  Height, weight, and BMI.  Blood pressure.  Lipid and cholesterol levels. These may be checked every 5 years, or more frequently if you are over 48 years old.  Skin check.  Lung cancer screening. You may have this screening every year starting at age 102 if you have a 30-pack-year history of smoking and currently smoke or have quit within the past 15 years.  Fecal occult blood test (FOBT) of the stool. You may have this test every year starting at age 17.  Flexible sigmoidoscopy or colonoscopy. You may have a sigmoidoscopy every 5 years or a colonoscopy every 10 years starting at age 29.  Hepatitis C blood test.  Hepatitis B blood test.  Sexually transmitted disease (STD) testing.  Diabetes screening. This is done by checking your blood sugar (glucose) after you have not eaten for a while (fasting). You may have this done every 1-3 years.  Bone density scan. This is done to screen for osteoporosis. You may have this done starting at  age 46.  Mammogram. This may be done every 1-2 years. Talk to your health care provider about how often you should have regular mammograms. Talk with your health care provider about your test results, treatment options, and if necessary, the need for more tests. Vaccines  Your health care  provider may recommend certain vaccines, such as:  Influenza vaccine. This is recommended every year.  Tetanus, diphtheria, and acellular pertussis (Tdap, Td) vaccine. You may need a Td booster every 10 years.  Zoster vaccine. You may need this after age 69.  Pneumococcal 13-valent conjugate (PCV13) vaccine. One dose is recommended after age 84.  Pneumococcal polysaccharide (PPSV23) vaccine. One dose is recommended after age 58. Talk to your health care provider about which screenings and vaccines you need and how often you need them. This information is not intended to replace advice given to you by your health care provider. Make sure you discuss any questions you have with your health care provider. Document Released: 01/19/2015 Document Revised: 09/12/2015 Document Reviewed: 10/24/2014 Elsevier Interactive Patient Education  2017 [place] Prevention in the Home Falls can cause injuries. They can happen to people of all ages. There are many things you can do to make your home safe and to help prevent falls. What can I do on the outside of my home?  Regularly fix the edges of walkways and driveways and fix any cracks.  Remove anything that might make you trip as you walk through a door, such as a raised step or threshold.  Trim any bushes or trees on the path to your home.  Use bright outdoor lighting.  Clear any walking paths of anything that might make someone trip, such as rocks or tools.  Regularly check to see if handrails are loose or broken. Make sure that both sides of any steps have handrails.  Any raised decks and porches should have guardrails on the edges.  Have any leaves, snow, or ice cleared regularly.  Use sand or salt on walking paths during winter.  Clean up any spills in your garage right away. This includes oil or grease spills. What can I do in the bathroom?  Use night lights.  Install grab bars by the toilet and in the tub and shower. Do  not use towel bars as grab bars.  Use non-skid mats or decals in the tub or shower.  If you need to sit down in the shower, use a plastic, non-slip stool.  Keep the floor dry. Clean up any water that spills on the floor as soon as it happens.  Remove soap buildup in the tub or shower regularly.  Attach bath mats securely with double-sided non-slip rug tape.  Do not have throw rugs and other things on the floor that can make you trip. What can I do in the bedroom?  Use night lights.  Make sure that you have a light by your bed that is easy to reach.  Do not use any sheets or blankets that are too big for your bed. They should not hang down onto the floor.  Have a firm chair that has side arms. You can use this for support while you get dressed.  Do not have throw rugs and other things on the floor that can make you trip. What can I do in the kitchen?  Clean up any spills right away.  Avoid walking on wet floors.  Keep items that you use a lot in easy-to-reach places.  If you  need to reach something above you, use a strong step stool that has a grab bar.  Keep electrical cords out of the way.  Do not use floor polish or wax that makes floors slippery. If you must use wax, use non-skid floor wax.  Do not have throw rugs and other things on the floor that can make you trip. What can I do with my stairs?  Do not leave any items on the stairs.  Make sure that there are handrails on both sides of the stairs and use them. Fix handrails that are broken or loose. Make sure that handrails are as long as the stairways.  Check any carpeting to make sure that it is firmly attached to the stairs. Fix any carpet that is loose or worn.  Avoid having throw rugs at the top or bottom of the stairs. If you do have throw rugs, attach them to the floor with carpet tape.  Make sure that you have a light switch at the top of the stairs and the bottom of the stairs. If you do not have them,  ask someone to add them for you. What else can I do to help prevent falls?  Wear shoes that:  Do not have high heels.  Have rubber bottoms.  Are comfortable and fit you well.  Are closed at the toe. Do not wear sandals.  If you use a stepladder:  Make sure that it is fully opened. Do not climb a closed stepladder.  Make sure that both sides of the stepladder are locked into place.  Ask someone to hold it for you, if possible.  Clearly mark and make sure that you can see:  Any grab bars or handrails.  First and last steps.  Where the edge of each step is.  Use tools that help you move around (mobility aids) if they are needed. These include:  Canes.  Walkers.  Scooters.  Crutches.  Turn on the lights when you go into a dark area. Replace any light bulbs as soon as they burn out.  Set up your furniture so you have a clear path. Avoid moving your furniture around.  If any of your floors are uneven, fix them.  If there are any pets around you, be aware of where they are.  Review your medicines with your doctor. Some medicines can make you feel dizzy. This can increase your chance of falling. Ask your doctor what other things that you can do to help prevent falls. This information is not intended to replace advice given to you by your health care provider. Make sure you discuss any questions you have with your health care provider. Document Released: 10/19/2008 Document Revised: 05/31/2015 Document Reviewed: 01/27/2014 Elsevier Interactive Patient Education  2017 [employer].

## 2019-11-18 ENCOUNTER — Telehealth: Payer: Self-pay | Admitting: Pharmacist

## 2019-11-18 NOTE — Chronic Care Management (AMB) (Signed)
Chronic Care Management Pharmacy Assistant   Name: Christy Moore  MRN: [id number] DOB: 1945/06/12  Reason for Encounter: Medication Review/Initial Questions for Pharmacist visit on 11/22/2019  Patient Questions: 1. Have you seen any other providers since your last visit? No 2. Any changes in your medications or health? No 3. Any side effects from any medications? No 4. Do you have any symptoms or problems not managed by your medications?  Christy Moore She would like to find other medication that can help with her Pepcid 40 mg. 5. Any concerns about your health right now?  Christy Moore She would like to find other rheumatology. 6. Has your provider asked that you check blood pressure, blood sugar, or follow a special diet at home? No 7. Do you get any type of exercise regularly?  . She does yard work and walks 8. Can you think of a goal you would like to reach for your health? No 9. Do you have any problems getting your medications? No  10. Is there anything that you would like to discuss during the appointment? No  The patient was asked to please bring medications, blood pressure/ blood sugar log, and supplements to your appointment.   PCP : Eulas Post, MD  Allergies:   Allergies  Allergen Reactions  . Amoxicillin-Pot Clavulanate     REACTION: nausea  . Azithromycin     REACTION: nausea, Gi  . Celecoxib     REACTION: nausea  . Ciprofloxacin     REACTION: nausea  . Doxycycline     REACTION: gi upset  . Erythromycin     REACTION: nause, GI  . Hydrocodone     REACTION: nausea  . Hydrocodone-Acetaminophen     REACTION: GI upset  . Levofloxacin     REACTION: she claims renal failiure  . Penicillins   . Propoxyphene N-Acetaminophen   . Rofecoxib     REACTION: nausea  . Sulfa Antibiotics   . Tetanus Toxoid     Medications: Outpatient Encounter Medications as of 11/18/2019  Medication Sig  . amLODipine (NORVASC) 2.5 MG tablet TAKE 1 TABLET BY MOUTH EVERY DAY  . famotidine  (PEPCID) 40 MG tablet TAKE 1 TABLET BY MOUTH EVERYDAY AT BEDTIME  . famotidine (PEPCID) 40 MG tablet Take 40 mg by mouth at bedtime.  . fluticasone (FLONASE) 50 MCG/ACT nasal spray Place 2 sprays into both nostrils daily as needed for allergies.   Christy Moore levothyroxine (SYNTHROID) 100 MCG tablet TAKE 1 TABLET (100 MCG TOTAL) BY MOUTH DAILY. NEEDS APPT WITH LABS CALL [phone]  . pantoprazole (PROTONIX) 40 MG tablet TAKE 1 TABLET BY MOUTH EVERY DAY  . [DISCONTINUED] metoprolol succinate (TOPROL-XL) 25 MG 24 hr tablet 1/2 tablet twice a day  . [DISCONTINUED] mupirocin (BACTROBAN) 2 % nasal ointment by Nasal route 2 (two) times daily. Use one-half of tube in each nostril twice daily for five (5) days. After application, press sides of nose together and gently massage.   . [DISCONTINUED] topiramate (TOPAMAX) 25 MG tablet Take 25 mg by mouth at bedtime.     No facility-administered encounter medications on file as of 11/18/2019.    Current Diagnosis: Patient Active Problem List   Diagnosis Date Noted  . Thrush 11/03/2012  . Sjogren's disease ([place]) 01/22/2012  . GERD (gastroesophageal reflux disease) 03/19/2011  . Hypertension 03/19/2011  . Raynaud's disease 03/19/2011  . Cardiomegaly 02/26/2010  . Hypothyroidism 02/26/2010  . Wrist fracture, left 02/26/2010  . Liver hemangioma 02/26/2010  . Migraine 02/26/2010  .  Esophageal stricture 02/26/2010  . Weight loss 02/26/2010  . OTHER CHRONIC SINUSITIS 12/20/2007  . Rosacea 12/20/2007    Goals Addressed   None     Follow-Up:  Pharmacist Review   Maia Breslow, [place] Assistant [phone]

## 2019-11-21 ENCOUNTER — Telehealth: Payer: Self-pay | Admitting: *Deleted

## 2019-11-21 DIAGNOSIS — I517 Cardiomegaly: Secondary | ICD-10-CM

## 2019-11-21 DIAGNOSIS — D1803 Hemangioma of intra-abdominal structures: Secondary | ICD-10-CM

## 2019-11-21 DIAGNOSIS — M3500 Sicca syndrome, unspecified: Secondary | ICD-10-CM

## 2019-11-21 DIAGNOSIS — E038 Other specified hypothyroidism: Secondary | ICD-10-CM

## 2019-11-21 DIAGNOSIS — K219 Gastro-esophageal reflux disease without esophagitis: Secondary | ICD-10-CM

## 2019-11-21 DIAGNOSIS — I10 Essential (primary) hypertension: Secondary | ICD-10-CM

## 2019-11-21 NOTE — Telephone Encounter (Signed)
-----   Message from Viona Gilmore, [facility] sent at 11/18/2019  4:34 PM EST ----- Regarding: CCM referral Hi,  Can you please put in a referral for Sherin Landry? She is another patient of Dr. Elease Hashimoto.  Thank you, Maddie

## 2019-11-22 ENCOUNTER — Telehealth: Payer: Medicare Other

## 2019-12-08 ENCOUNTER — Telehealth: Payer: Self-pay | Admitting: Pharmacist

## 2019-12-08 NOTE — Chronic Care Management (AMB) (Signed)
I left the patient a message about her upcoming appointment on 12-09-2019  @ 10:30 am with the clinical pharmacist. She was asked to please have all medication on hand to review the pharmacist.  Maia Breslow, [place] Assistant [phone]

## 2019-12-09 ENCOUNTER — Ambulatory Visit: Payer: Medicare Other | Admitting: Pharmacist

## 2019-12-09 DIAGNOSIS — K219 Gastro-esophageal reflux disease without esophagitis: Secondary | ICD-10-CM

## 2019-12-09 DIAGNOSIS — I10 Essential (primary) hypertension: Secondary | ICD-10-CM

## 2019-12-09 NOTE — Chronic Care Management (AMB) (Signed)
Chronic Care Management Pharmacy  Name: MARGARINE GROSSHANS  MRN: [id number] DOB: 01-Sep-1945  Initial Planning Appointment: completed 11/18/19  Initial Questions: 1. Have you seen any other providers since your last visit? n/a 2. Any changes in your medicines or health? No   Chief Complaint/ HPI  Christy Moore,  74 y.o. , female presents for their Initial CCM visit with the clinical pharmacist via telephone due to COVID-19 Pandemic.  PCP : Eulas Post, MD  Their chronic conditions include: HTN, GERD, hypothyroidism, allergic rhinitis  Office Visits: -10/17/19 Ofilia Neas, LPN: Patient presented for medicare annual wellness exam.   -02/28/19 [name], MD: Patient presented for dysphagia and GERD evaluation. Referral placed for gastroenterology.  -12/13/18 [name], MD: Patient presented with shoulder pain and HTN follow up. Prednisone taper prescribed.  Consult Visit: -07/13/19 Shelle [facility]): Patient presented for neck pain follow up. Unable to access notes.  -04/20/19 Marica Otter (optometry): Patient presented for eye exam. Unable to access notes.  -03/25/19 Rolene Course (gastro): Patient presented for initial GERD consult. Prescribed famotidine 40 mg at bedtime.   Medications: Outpatient Encounter Medications as of 12/09/2019  Medication Sig  . amLODipine (NORVASC) 2.5 MG tablet TAKE 1 TABLET BY MOUTH EVERY DAY  . famotidine (PEPCID) 40 MG tablet TAKE 1 TABLET BY MOUTH EVERYDAY AT BEDTIME  . fluticasone (FLONASE) 50 MCG/ACT nasal spray Place 2 sprays into both nostrils daily as needed for allergies.   . Multiple Vitamins-Minerals (CENTRUM SILVER ADULT 50+ PO) Take 1 tablet by mouth daily as needed.  . [DISCONTINUED] levothyroxine (SYNTHROID) 100 MCG tablet TAKE 1 TABLET (100 MCG TOTAL) BY MOUTH DAILY. NEEDS APPT WITH LABS CALL [phone]  . [DISCONTINUED] famotidine (PEPCID) 40 MG tablet Take 40 mg by mouth at bedtime.  . [DISCONTINUED]  metoprolol succinate (TOPROL-XL) 25 MG 24 hr tablet 1/2 tablet twice a day  . [DISCONTINUED] mupirocin (BACTROBAN) 2 % nasal ointment by Nasal route 2 (two) times daily. Use one-half of tube in each nostril twice daily for five (5) days. After application, press sides of nose together and gently massage.   . [DISCONTINUED] pantoprazole (PROTONIX) 40 MG tablet TAKE 1 TABLET BY MOUTH EVERY DAY  . [DISCONTINUED] topiramate (TOPAMAX) 25 MG tablet Take 25 mg by mouth at bedtime.     No facility-administered encounter medications on file as of 12/09/2019.   Patient lives with 1 cat and 1 dog inside and her husband. She also feeds about 5 cats in the morning and 10 cats at dinner that live outside nearby. She has some family nearby in [place] and some in [place] and she just saw her son in [place] for Thanksgiving.   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 She typically cooks meat with two or three veggies and mostly from [employer]. She doesn't eat processesed food. She does eat chicken and some pork and beef and eats fish out at restaurants. She eats out pretty much every day.  For exercise, she does some yardwork and tries to get outside every day. She does raking, cleans the gutters, and gardens. They grew beans and tomatoes and corn, peppers, and okra in the garden.   Patient denies any problems with falling asleep and goes to bed at 12-1am get up at 7:30-8:30 in the morning. She does drink coffee in the morning but feels rested during the day and does not nap often.   Patient enjoys working in the [employer], likes to read, and watch certain  TV programs.  Patient denies any current problems with her medicines except she would like to find another medication that can help with her famotidine 40 mg, as it has not been helping enough.   Current Diagnosis/Assessment:  Goals Addressed            This Visit's Progress   . Pharmacy Care Plan       CARE PLAN ENTRY (see longitudinal  plan of care for additional care plan information)  Current Barriers:  . Chronic Disease Management support, education, and care coordination needs related to Hypertension, GERD, Hypothyroidism, and Allergic Rhinitis   Hypertension BP Readings from Last 3 Encounters:  02/28/19 120/68  12/13/18 118/74  12/11/18 (!) 159/66   . Pharmacist Clinical Goal(s): o Over the next 180 days, patient will work with PharmD and providers to maintain BP goal <140/90 . Current regimen:  o Amlodipine 2.5 mg 1 tablet daily . Interventions: o Discussed DASH eating plan recommendations: . Emphasizes vegetables, fruits, and whole-grains . Includes fat-free or low-fat dairy products, fish, poultry, beans, nuts, and vegetable oils . Limits foods that are high in saturated fat. These foods include fatty meats, full-fat dairy products, and tropical oils such as coconut, palm kernel, and palm oils. . Limits sugar-sweetened beverages and sweets . Limiting sodium intake to < 1500 mg/day . Patient self care activities - Over the next 180 days, patient will: o Check blood pressure a few times a month, document, and provide at future appointments o Ensure daily salt intake < 2300 mg/day  Hypothyroidism Lab Results  Component Value Date   TSH 1.78 06/29/2018   . Pharmacist Clinical Goal(s): o Over the next 180 days, patient will work with PharmD and providers to maintain TSH between 0.4 to 4.5 . Current regimen:  o Levothyroxine 100 mcg 1 tablet daily . Interventions: o We discussed:  consistent administration on an empty stomach with water prior to other medications . Patient self care activities - Over the next 180 days, patient will: o Continue current medication  Heartburn . Pharmacist Clinical Goal(s): o Over the next 180 days, patient will work with PharmD and providers to manage symptoms of heartburn . Current regimen:  o Famotidine 40 mg 1 tablet daily . Interventions: o Discussed  non-pharmacologic management of symptoms such as elevating the head of your bed, avoiding eating 2-3 hours before bed, avoiding triggering foods such as acidic, spicy, or fatty foods, eating smaller meals, and wearing clothes that are loose around the waist . Patient self care activities - Over the next 180 days, patient will: o Increase famotidine up to 40 mg twice daily to find relief for heartburn o If unable to find relief, make an appointment with Dr. Elease Hashimoto  Allergic rhinitis . Pharmacist Clinical Goal(s) o Over the next 180 days, patient will work with PharmD and providers to manage symptoms of allergic rhinitis . Current regimen:  . Flonase 50 mcg/act 2 sprays in both nostrils as needed . Flonase sensimist as needed . Patient self care activities - Over the next 180 days, patient will: o Continue current medication  Medication management . Pharmacist Clinical Goal(s): o Over the next 180 days, patient will work with PharmD and providers to maintain optimal medication adherence . [facility] . Interventions o Comprehensive medication review performed. o Continue current medication management strategy . Patient self care activities - Over the next 180 days, patient will: o Take medications as prescribed o Report any questions or concerns to PharmD and/or provider(s)  Initial goal documentation       SDOH Interventions   Flowsheet Row Most Recent Value  SDOH Interventions   Financial Strain Interventions Intervention Not Indicated  Transportation Interventions Intervention Not Indicated      Hypertension   BP goal is:  <140/90  Office blood pressures are  BP Readings from Last 3 Encounters:  02/28/19 120/68  12/13/18 118/74  12/11/18 (!) 159/66   Patient checks BP at home never Patient home BP readings are ranging: n/a  Patient has failed these meds in the past: metoprolol (for HR) Patient is currently controlled on the following medications:   . Amlodipine 2.5 mg 1 tablet daily  We discussed diet and exercise extensively -DASH eating plan recommendations: . Emphasizes vegetables, fruits, and whole-grains . Includes fat-free or low-fat dairy products, fish, poultry, beans, nuts, and vegetable oils . Limits foods that are high in saturated fat. These foods include fatty meats, full-fat dairy products, and tropical oils such as coconut, palm kernel, and palm oils. . Limits sugar-sweetened beverages and sweets . Limiting sodium intake to < 1500 mg/day -Diet: limits salt, can't do super sweet foods, can't do spicy foods - cut back to one cup of coffee  Plan  Continue current medications    GERD   Patient has failed these meds in past: Pantoprazole (hurt stomach) Patient is currently uncontrolled on the following medications:  . Famotidine 40 mg 1 tablet daily  We discussed:  Non-pharmacologic management of symptoms such as elevating the head of your bed, avoiding eating 2-3 hours before bed, avoiding triggering foods such as acidic, spicy, or fatty foods, eating smaller meals, and wearing clothes that are loose around the waist; can't do exercises due to heartburn  Plan Patient will try 40 mg BID (or 20 mg in AM and 40 mg in PM) for additional relief. Recommended discussing with PCP if needing further management. Continue current medications  Hypothyroidism   Lab Results  Component Value Date/Time   TSH 1.78 06/29/2018 09:45 AM   TSH 1.11 05/20/2017 09:54 AM    Patient has failed these meds in past: none Patient is currently controlled on the following medications:  . Levothyroxine 100 mcg 1 tablet daily  We discussed:  consistent administration on an empty stomach with water prior to other medications  Plan  Continue current medications  Allergic rhinitis   Patient has failed these meds in past: none Patient is currently controlled on the following medications:  . Flonase 50 mcg/act 2 sprays in both nostrils  as needed . Flonase sensimist  PRN  We discussed:  Avoiding allergy triggers  Plan  Continue current medications   Vaccines   Reviewed and discussed patient's vaccination history.    Immunization History  Administered Date(s) Administered  . Fluad Quad(high Dose 65+) 09/16/2018, 10/03/2019  . Influenza Split 11/22/2011  . Influenza, High Dose Seasonal PF 09/14/2014, 09/07/2015, 10/07/2016, 10/26/2017, 09/16/2018  . Influenza,inj,Quad PF,6+ Mos 10/05/2013  . Influenza,inj,quad, With Preservative 10/06/2016, 11/02/2017  . Influenza-Unspecified 11/22/2011, 10/05/2013, 09/14/2014, 09/07/2015, 10/06/2016, 10/07/2016, 10/26/2017, 11/02/2017  . PFIZER SARS-COV-2 Vaccination 02/11/2019, 03/08/2019, 10/04/2019  . Pneumococcal Conjugate-13 10/05/2013  . Pneumococcal Polysaccharide-23 02/19/2015  . Zoster 01/17/2010    Plan  Recommended patient receive Shingles vaccine at pharmacy.   Medication Management   Patient's preferred pharmacy is:  [id number] [place], [place] - [address] AT [address] [place] [address] Phone: [phone] Fax: [phone]  Uses pill box? No - ledge of kitchen  window Pt endorses 90% compliance - needs to make a habit for famotidine  We discussed: Current pharmacy is preferred with insurance plan and patient is satisfied with pharmacy services  Plan  Continue current medication management strategy    Follow up: 6 month phone visit 3 month BP assessment with CPA  Jeni Salles, PharmD [place] Pharmacist [place] at [place]

## 2019-12-20 ENCOUNTER — Other Ambulatory Visit: Payer: Self-pay | Admitting: Family Medicine

## 2019-12-26 NOTE — Patient Instructions (Addendum)
Hi Enis!  It was so lovely to get to speak with you over the phone! As we had discussed, I do think it would be helpful to check your blood pressure at home every once in a while to make sure your medication is working properly. Also, if you do not find relief from famotidine for your heartburn, please make an appointment with Dr. Elease Hashimoto to discuss further. I also wanted to remind you to look into getting the shingles vaccine at the pharmacy.  Please give me a call if you need anything from me or have any questions before our follow up!  Best, Maddie  Jeni Salles, PharmD [facility] Clinical Pharmacist [place] at [place]   Visit Information  Goals Addressed            This Visit's Progress   . Pharmacy Care Plan       CARE PLAN ENTRY (see longitudinal plan of care for additional care plan information)  Current Barriers:  . Chronic Disease Management support, education, and care coordination needs related to Hypertension, GERD, Hypothyroidism, and Allergic Rhinitis   Hypertension BP Readings from Last 3 Encounters:  02/28/19 120/68  12/13/18 118/74  12/11/18 (!) 159/66   . Pharmacist Clinical Goal(s): o Over the next 180 days, patient will work with PharmD and providers to maintain BP goal <140/90 . Current regimen:  o Amlodipine 2.5 mg 1 tablet daily . Interventions: o Discussed DASH eating plan recommendations: . Emphasizes vegetables, fruits, and whole-grains . Includes fat-free or low-fat dairy products, fish, poultry, beans, nuts, and vegetable oils . Limits foods that are high in saturated fat. These foods include fatty meats, full-fat dairy products, and tropical oils such as coconut, palm kernel, and palm oils. . Limits sugar-sweetened beverages and sweets . Limiting sodium intake to < 1500 mg/day . Patient self care activities - Over the next 180 days, patient will: o Check blood pressure a few times a month, document, and provide  at future appointments o Ensure daily salt intake < 2300 mg/day  Hypothyroidism Lab Results  Component Value Date   TSH 1.78 06/29/2018   . Pharmacist Clinical Goal(s): o Over the next 180 days, patient will work with PharmD and providers to maintain TSH between 0.4 to 4.5 . Current regimen:  o Levothyroxine 100 mcg 1 tablet daily . Interventions: o We discussed:  consistent administration on an empty stomach with water prior to other medications . Patient self care activities - Over the next 180 days, patient will: o Continue current medication  Heartburn . Pharmacist Clinical Goal(s): o Over the next 180 days, patient will work with PharmD and providers to manage symptoms of heartburn . Current regimen:  o Famotidine 40 mg 1 tablet daily . Interventions: o Discussed non-pharmacologic management of symptoms such as elevating the head of your bed, avoiding eating 2-3 hours before bed, avoiding triggering foods such as acidic, spicy, or fatty foods, eating smaller meals, and wearing clothes that are loose around the waist . Patient self care activities - Over the next 180 days, patient will: o Increase famotidine up to 40 mg twice daily to find relief for heartburn o If unable to find relief, make an appointment with Dr. Elease Hashimoto  Allergic rhinitis . Pharmacist Clinical Goal(s) o Over the next 180 days, patient will work with PharmD and providers to manage symptoms of allergic rhinitis . Current regimen:  . Flonase 50 mcg/act 2 sprays in both nostrils as needed . Flonase sensimist as needed . Patient  self care activities - Over the next 180 days, patient will: o Continue current medication  Medication management . Pharmacist Clinical Goal(s): o Over the next 180 days, patient will work with PharmD and providers to maintain optimal medication adherence . [facility] . Interventions o Comprehensive medication review performed. o Continue current medication  management strategy . Patient self care activities - Over the next 180 days, patient will: o Take medications as prescribed o Report any questions or concerns to PharmD and/or provider(s)  Initial goal documentation        Ms. Remo was given information about Chronic Care Management services today including:  1. CCM service includes personalized support from designated clinical staff supervised by her physician, including individualized plan of care and coordination with other care providers 2. 24/7 contact phone numbers for assistance for urgent and routine care needs. 3. Standard insurance, coinsurance, copays and deductibles apply for chronic care management only during months in which we provide at least 20 minutes of these services. Most insurances cover these services at 100%, however patients may be responsible for any copay, coinsurance and/or deductible if applicable. This service may help you avoid the need for more expensive face-to-face services. 4. Only one practitioner may furnish and bill the service in a calendar month. 5. The patient may stop CCM services at any time (effective at the end of the month) by phone call to the office staff.  Patient agreed to services and verbal consent obtained.   The patient verbalized understanding of instructions, educational materials, and care plan provided today and agreed to receive a mailed copy of patient instructions, educational materials, and care plan.  Telephone follow up appointment with pharmacy team member scheduled for: 6 months  Viona Gilmore, [place] for Gastroesophageal Reflux Disease, Adult When you have gastroesophageal reflux disease (GERD), the foods you eat and your eating habits are very important. Choosing the right foods can help ease your discomfort. Think about working with a nutrition specialist (dietitian) to help you make good choices. What are tips for following this plan?  Meals  Choose  healthy foods that are low in fat, such as fruits, vegetables, whole grains, low-fat dairy products, and lean meat, fish, and poultry.  Eat small meals often instead of 3 large meals a day. Eat your meals slowly, and in a place where you are relaxed. Avoid bending over or lying down until 2-3 hours after eating.  Avoid eating meals 2-3 hours before bed.  Avoid drinking a lot of liquid with meals.  Cook foods using methods other than frying. Bake, grill, or broil food instead.  Avoid or limit: ? Chocolate. ? Peppermint or spearmint. ? Alcohol. ? Pepper. ? Black and decaffeinated coffee. ? Black and decaffeinated tea. ? Bubbly (carbonated) soft drinks. ? Caffeinated energy drinks and soft drinks.  Limit high-fat foods such as: ? Fatty meat or fried foods. ? Whole milk, cream, butter, or ice cream. ? Nuts and nut butters. ? Pastries, donuts, and sweets made with butter or shortening.  Avoid foods that cause symptoms. These foods may be different for everyone. Common foods that cause symptoms include: ? Tomatoes. ? Oranges, lemons, and limes. ? Peppers. ? Spicy food. ? Onions and garlic. ? Vinegar. Lifestyle  Maintain a healthy weight. Ask your doctor what weight is healthy for you. If you need to lose weight, work with your doctor to do so safely.  Exercise for at least 30 minutes for 5 or more days  each week, or as told by your doctor.  Wear loose-fitting clothes.  Do not smoke. If you need help quitting, ask your doctor.  Sleep with the head of your bed higher than your feet. Use a wedge under the mattress or blocks under the bed frame to raise the head of the bed. Summary  When you have gastroesophageal reflux disease (GERD), food and lifestyle choices are very important in easing your symptoms.  Eat small meals often instead of 3 large meals a day. Eat your meals slowly, and in a place where you are relaxed.  Limit high-fat foods such as fatty meat or fried  foods.  Avoid bending over or lying down until 2-3 hours after eating.  Avoid peppermint and spearmint, caffeine, alcohol, and chocolate. This information is not intended to replace advice given to you by your health care provider. Make sure you discuss any questions you have with your health care provider. Document Revised: 04/15/2018 Document Reviewed: 01/29/2016 Elsevier Patient Education  [place].

## 2020-01-05 ENCOUNTER — Telehealth: Payer: Self-pay | Admitting: Family Medicine

## 2020-01-05 DIAGNOSIS — R002 Palpitations: Secondary | ICD-10-CM

## 2020-01-05 DIAGNOSIS — I10 Essential (primary) hypertension: Secondary | ICD-10-CM

## 2020-01-05 NOTE — Telephone Encounter (Signed)
Patient called and wanted to see if the provider could put her in a referral to Dr. Chilton Si at [facility], please advise. CB is [phone]

## 2020-01-05 NOTE — Telephone Encounter (Signed)
Left a message for the pt to return my call.  

## 2020-01-05 NOTE — Telephone Encounter (Signed)
OK to refer but we need to know why she is requesting referral (in order to place the order)

## 2020-01-09 NOTE — Telephone Encounter (Signed)
Pt is returning Joannes call.

## 2020-01-09 NOTE — Telephone Encounter (Signed)
Left a detailed message at the pts home number to return a call with the reason she is requesting the referral as below.

## 2020-01-10 NOTE — Telephone Encounter (Signed)
Patient called and stated that she needed a referral to check to make sure she doesn't have any heart flutters just to make sure things are working properly. Pt also stated that every so often she experiences chest pressure, please advise.CB is [phone]

## 2020-01-10 NOTE — Telephone Encounter (Signed)
Okay to refer to cardiology to Dr. Duke Salvia as requested

## 2020-01-10 NOTE — Telephone Encounter (Signed)
Referral ordered

## 2020-01-10 NOTE — Addendum Note (Signed)
Addended by: Judd Gaudier on: 01/10/2020 01:33 PM   Modules accepted: Orders

## 2020-02-14 ENCOUNTER — Other Ambulatory Visit: Payer: Self-pay | Admitting: Family Medicine

## 2020-03-03 ENCOUNTER — Telehealth (INDEPENDENT_AMBULATORY_CARE_PROVIDER_SITE_OTHER): Payer: Medicare Other | Admitting: Family Medicine

## 2020-03-03 DIAGNOSIS — L03119 Cellulitis of unspecified part of limb: Secondary | ICD-10-CM | POA: Diagnosis not present

## 2020-03-03 DIAGNOSIS — K219 Gastro-esophageal reflux disease without esophagitis: Secondary | ICD-10-CM | POA: Diagnosis not present

## 2020-03-03 DIAGNOSIS — I10 Essential (primary) hypertension: Secondary | ICD-10-CM

## 2020-03-03 MED ORDER — CEPHALEXIN 500 MG PO CAPS: 500.0000 mg | ORAL_CAPSULE | Freq: Three times a day (TID) | ORAL | 0 refills | Status: DC

## 2020-03-03 MED ORDER — SUCRALFATE 1 G PO TABS: 1.0000 g | ORAL_TABLET | Freq: Three times a day (TID) | ORAL | 0 refills | Status: DC

## 2020-03-03 MED ORDER — AMLODIPINE BESYLATE 2.5 MG PO TABS: 2.5000 mg | ORAL_TABLET | Freq: Every day | ORAL | 0 refills | Status: DC

## 2020-03-03 NOTE — Progress Notes (Signed)
[place] [facility] [address], [place], [place] [address] [phone]  Date:  03/03/2020   Name:  Christy Moore   DOB:  12-Nov-1945   MRN:  [id number]  PCP:  Eulas Post, MD    Chief Complaint: Cellulitis (Onset: 1 day , left foot )   History of Present Illness:  Christy Moore is a 75 y.o. very pleasant female patient who presents with the following:  Virtual visit today for concern of possible cellulitis Patient location is home, my location is home.  Patient identity confirmed with 2 factors, she gives consent for virtual visit today.  The patient myself are present on the call today Primary patient of Bruce Burchette-last visit with him 1 year ago  History of Sjogren's disease, migraine headache, hypertension, GERD, cardiomegaly  She has many medications listed on her allergy profile, most of the antibiotics appear to be GI upset or nausea as opposed to true allergy  Today pt notes that she might have cellulitis on the top of her right foot She has had this in the past and these sx are similar She noted the sx last night - felt stiff  No break in the skin, no recent pedicure or shaving The area is pink/red and inflamed, it is a bit swollen She notes a "tiny dot" on the side of the foot but is not sure if this might be nidus of infection She does not have DM She otherwise feels well, no fever- no flu like symptoms  The red area is about the size of a small potato She tried wearing shoes and pressure on the foot is painful   We discussed her allergy profile.  She has used cephalexin in the recent past, most recently in 2020.  She notes she tolerated this medication well.  No history of true allergy-rash, hives, anaphylaxis, itching with penicillins in the past -GI upset only Patient Active Problem List   Diagnosis Date Noted  . Thrush 11/03/2012  . Sjogren's disease ([place]) 01/22/2012  . GERD (gastroesophageal  reflux disease) 03/19/2011  . Hypertension 03/19/2011  . Raynaud's disease 03/19/2011  . Cardiomegaly 02/26/2010  . Hypothyroidism 02/26/2010  . Wrist fracture, left 02/26/2010  . Liver hemangioma 02/26/2010  . Migraine 02/26/2010  . Esophageal stricture 02/26/2010  . Weight loss 02/26/2010  . OTHER CHRONIC SINUSITIS 12/20/2007  . Rosacea 12/20/2007    Past Medical History:  Diagnosis Date  . Cardiomegaly   . GERD (gastroesophageal reflux disease)   . Hypertension   . Hypothyroidism   . Sinus tachycardia     Past Surgical History:  Procedure Laterality Date  . HAND TENDON SURGERY  02/09/2007   tendon transfer-Dr. Amedeo Plenty  . NASAL SEPTUM SURGERY     operated on by Dr. Ernesto Rutherford 20 years ago  . OVARIAN CYST REMOVAL     Right Sided Salpingo-oopherectomy  . SALPINGOOPHORECTOMY      Social History   Tobacco Use  . Smoking status: Never Smoker  . Smokeless tobacco: Never Used  Vaping Use  . Vaping Use: Never used  Substance Use Topics  . Alcohol use: No  . Drug use: No    Family History  Problem Relation Age of Onset  . Allergies Other        Grandmother-pt states she takes after grandmother who had multiple allergies  . Cancer Mother        breast and lung  . Cancer Father  prostate  . Alcohol abuse Maternal Grandmother   . Alcohol abuse Maternal Grandfather     Allergies  Allergen Reactions  . Amoxicillin-Pot Clavulanate     REACTION: nausea  . Azithromycin     REACTION: nausea, Gi  . Celecoxib     REACTION: nausea  . Ciprofloxacin     REACTION: nausea  . Doxycycline     REACTION: gi upset  . Erythromycin     REACTION: nause, GI  . Hydrocodone     REACTION: nausea  . Hydrocodone-Acetaminophen     REACTION: GI upset  . Levofloxacin     REACTION: she claims renal failiure  . Penicillins   . Propoxyphene N-Acetaminophen   . Rofecoxib     REACTION: nausea  . Sulfa Antibiotics   . Tetanus Toxoid     Medication list has been reviewed and  updated.  Current Outpatient Medications on File Prior to Visit  Medication Sig Dispense Refill  . amLODipine (NORVASC) 2.5 MG tablet TAKE 1 TABLET BY MOUTH EVERY DAY 30 tablet 0  . famotidine (PEPCID) 40 MG tablet TAKE 1 TABLET BY MOUTH EVERYDAY AT BEDTIME    . fluticasone (FLONASE) 50 MCG/ACT nasal spray Place 2 sprays into both nostrils daily as needed for allergies.     Marland Kitchen levothyroxine (SYNTHROID) 100 MCG tablet TAKE 1 TABLET BY MOUTH DAILY. NEEDS APPT WITH LABS CALL [phone] 90 tablet 0  . Multiple Vitamins-Minerals (CENTRUM SILVER ADULT 50+ PO) Take 1 tablet by mouth daily as needed.    . [DISCONTINUED] metoprolol succinate (TOPROL-XL) 25 MG 24 hr tablet 1/2 tablet twice a day 90 tablet 0  . [DISCONTINUED] mupirocin (BACTROBAN) 2 % nasal ointment by Nasal route 2 (two) times daily. Use one-half of tube in each nostril twice daily for five (5) days. After application, press sides of nose together and gently massage.     . [DISCONTINUED] topiramate (TOPAMAX) 25 MG tablet Take 25 mg by mouth at bedtime.       No current facility-administered medications on file prior to visit.    Review of Systems:  As per HPI- otherwise negative.   Physical Examination: There were no vitals filed for this visit. There were no vitals filed for this visit. There is no height or weight on file to calculate BMI. Ideal Body Weight:    BP was 135/71, pulse 98 Not checking her temperature  BP Readings from Last 3 Encounters:  02/28/19 120/68  12/13/18 118/74  12/11/18 (!) 159/66   Pulse Readings from Last 3 Encounters:  02/28/19 87  12/13/18 100  12/11/18 94   Patient observed over video monitor.  She looks well, no wheezing or distress is noted.  We are using her desktop computer so she is not able to show me her foot on camera  Assessment and Plan: Hypertension, unspecified type - Plan: amLODipine (NORVASC) 2.5 MG tablet  Cellulitis of foot - Plan: cephALEXin (KEFLEX) 500 MG  capsule  Gastroesophageal reflux disease, unspecified whether esophagitis present - Plan: sucralfate (CARAFATE) 1 g tablet  Virtual visit today for concern of foot cellulitis.  The patient has had this issue intermittently over the past several years.  She has used Keflex in the recent past without incident except for increase in "stomach acid".  I called in Keflex for 1 week, will also give her Carafate to try in hopes this may reduce her stomach symptoms.  She also notes that her amlodipine needs to be refilled, she is overdue for a visit  with PCP.  I advised her I will refill for another month, but she does need to see her primary doctor soon.  She plans to do so  I have asked her to be seen in clinic next week if her foot is not improving.  If any significant worsening or symptoms such as fever or chills she is to seek immediate care  She states understanding and agreement.  Video used for duration of visit today  Signed Lamar Blinks, MD

## 2020-03-12 ENCOUNTER — Ambulatory Visit (INDEPENDENT_AMBULATORY_CARE_PROVIDER_SITE_OTHER): Payer: Medicare Other | Admitting: Family Medicine

## 2020-03-12 ENCOUNTER — Other Ambulatory Visit: Payer: Self-pay

## 2020-03-12 ENCOUNTER — Encounter: Payer: Self-pay | Admitting: Family Medicine

## 2020-03-12 VITALS — BP 122/76 | HR 90 | Temp 98.3°F | Ht 64.2 in | Wt 158.2 lb

## 2020-03-12 DIAGNOSIS — I10 Essential (primary) hypertension: Secondary | ICD-10-CM

## 2020-03-12 DIAGNOSIS — L03119 Cellulitis of unspecified part of limb: Secondary | ICD-10-CM

## 2020-03-12 DIAGNOSIS — E038 Other specified hypothyroidism: Secondary | ICD-10-CM | POA: Diagnosis not present

## 2020-03-12 LAB — BASIC METABOLIC PANEL
BUN: 15 mg/dL (ref 6–23)
CO2: 31 mEq/L (ref 19–32)
Calcium: 9.1 mg/dL (ref 8.4–10.5)
Chloride: 104 mEq/L (ref 96–112)
Creatinine, Ser: 0.86 mg/dL (ref 0.40–1.20)
GFR: 67.19 mL/min (ref >60.00)
Glucose, Bld: 90 mg/dL (ref 70–99)
Potassium: 4.2 mEq/L (ref 3.5–5.1)
Sodium: 142 mEq/L (ref 135–145)

## 2020-03-12 LAB — TSH: TSH: 2.08 u[IU]/mL (ref 0.35–4.50)

## 2020-03-12 MED ORDER — LEVOTHYROXINE SODIUM 100 MCG PO TABS: ORAL_TABLET | ORAL | 3 refills | Status: DC

## 2020-03-12 MED ORDER — AMLODIPINE BESYLATE 2.5 MG PO TABS: 2.5000 mg | ORAL_TABLET | Freq: Every day | ORAL | 3 refills | Status: DC

## 2020-03-12 NOTE — Progress Notes (Signed)
Established Patient Office Visit  Subjective:  Patient ID: Christy Moore, female    DOB: [birth year]  Age: 75 y.o. MRN: [id number]  CC:  Chief Complaint  Patient presents with  . Follow-up    Follow up from infectionin left foot clear up in 3 days     HPI Christy Moore presents for recent cellulitis changes left foot.  She had virtual visit was treated with Keflex.  She states after a few days her symptoms had fully resolved.  She has had no recurrent erythema.  She had recurrent cellulitis in the past.  No adverse side effects from the Keflex.  She has hypertension currently treated with low-dose amlodipine 2.5 mg daily.  Does not monitor blood pressure regularly at home.  No recent dizziness or headaches.  Compliant with therapy.  No chest pains.  She has hypothyroidism and is treated with levothyroxine 100 mcg daily.  Last TSH was 6/20.  She is overdue.  Does have some generalized fatigue but no other specific overt symptoms of hypothyroidism.  Past Medical History:  Diagnosis Date  . Cardiomegaly   . GERD (gastroesophageal reflux disease)   . Hypertension   . Hypothyroidism   . Sinus tachycardia     Past Surgical History:  Procedure Laterality Date  . HAND TENDON SURGERY  02/09/2007   tendon transfer-Dr. Amedeo Plenty  . NASAL SEPTUM SURGERY     operated on by Dr. Ernesto Rutherford 20 years ago  . OVARIAN CYST REMOVAL     Right Sided Salpingo-oopherectomy  . SALPINGOOPHORECTOMY      Family History  Problem Relation Age of Onset  . Allergies Other        Grandmother-pt states she takes after grandmother who had multiple allergies  . Cancer Mother        breast and lung  . Cancer Father        prostate  . Alcohol abuse Maternal Grandmother   . Alcohol abuse Maternal Grandfather     Social History   Socioeconomic History  . Marital status: Married    Spouse name: Not on file  . Number of children: Not on file  . Years of education: Not on file  . Highest education  level: Not on file  Occupational History  . Not on file  Tobacco Use  . Smoking status: Never Smoker  . Smokeless tobacco: Never Used  Vaping Use  . Vaping Use: Never used  Substance and Sexual Activity  . Alcohol use: No  . Drug use: No  . Sexual activity: Not on file  Other Topics Concern  . Not on file  Social History Narrative  . Not on file   Social Determinants of Health   Financial Resource Strain: Low Risk   . Difficulty of Paying Living Expenses: Not hard at all  Food Insecurity: No Food Insecurity  . Worried About Charity fundraiser in the Last Year: Never true  . Ran Out of Food in the Last Year: Never true  Transportation Needs: No Transportation Needs  . Lack of Transportation (Medical): No  . Lack of Transportation (Non-Medical): No  Physical Activity: Inactive  . Days of Exercise per Week: 0 days  . Minutes of Exercise per Session: 0 min  Stress: No Stress Concern Present  . Feeling of Stress : Not at all  Social Connections: Moderately Isolated  . Frequency of Communication with Friends and Family: Once a week  . Frequency of Social Gatherings with Friends and Family: Once  a week  . Attends Religious Services: More than 4 times per year  . Active Member of Clubs or Organizations: No  . Attends Archivist Meetings: Never  . Marital Status: Married  Human resources officer Violence: Not At Risk  . Fear of Current or Ex-Partner: No  . Emotionally Abused: No  . Physically Abused: No  . Sexually Abused: No    Outpatient Medications Prior to Visit  Medication Sig Dispense Refill  . fluticasone (FLONASE) 50 MCG/ACT nasal spray Place 2 sprays into both nostrils daily as needed for allergies.     Marland Kitchen amLODipine (NORVASC) 2.5 MG tablet Take 1 tablet (2.5 mg total) by mouth daily. 30 tablet 0  . levothyroxine (SYNTHROID) 100 MCG tablet TAKE 1 TABLET BY MOUTH DAILY. NEEDS APPT WITH LABS CALL [phone] 90 tablet 0  . Multiple Vitamins-Minerals (CENTRUM  SILVER ADULT 50+ PO) Take 1 tablet by mouth daily as needed.    . famotidine (PEPCID) 40 MG tablet TAKE 1 TABLET BY MOUTH EVERYDAY AT BEDTIME (Patient not taking: Reported on 03/12/2020)    . sucralfate (CARAFATE) 1 g tablet Take 1 tablet (1 g total) by mouth 4 (four) times daily -  with meals and at bedtime. (Patient not taking: Reported on 03/12/2020) 40 tablet 0  . cephALEXin (KEFLEX) 500 MG capsule Take 1 capsule (500 mg total) by mouth 3 (three) times daily. (Patient not taking: Reported on 03/12/2020) 21 capsule 0   No facility-administered medications prior to visit.    Allergies  Allergen Reactions  . Amoxicillin-Pot Clavulanate     REACTION: nausea  . Azithromycin     REACTION: nausea, Gi  . Celecoxib     REACTION: nausea  . Ciprofloxacin     REACTION: nausea  . Doxycycline     REACTION: gi upset  . Erythromycin     REACTION: nause, GI  . Hydrocodone     REACTION: nausea  . Hydrocodone-Acetaminophen     REACTION: GI upset  . Levofloxacin     REACTION: she claims renal failiure  . Penicillins   . Propoxyphene N-Acetaminophen   . Rofecoxib     REACTION: nausea  . Sulfa Antibiotics   . Tetanus Toxoid     ROS Review of Systems  Constitutional: Positive for fatigue.  Eyes: Negative for visual disturbance.  Respiratory: Negative for cough, chest tightness, shortness of breath and wheezing.   Cardiovascular: Negative for chest pain, palpitations and leg swelling.  Neurological: Negative for dizziness, seizures, syncope, weakness, light-headedness and headaches.      Objective:    Physical Exam Constitutional:      Appearance: She is well-developed and well-nourished.  Eyes:     Pupils: Pupils are equal, round, and reactive to light.  Neck:     Thyroid: No thyromegaly.     Vascular: No JVD.  Cardiovascular:     Rate and Rhythm: Normal rate and regular rhythm.     Heart sounds: No gallop.   Pulmonary:     Effort: Pulmonary effort is normal. No respiratory  distress.     Breath sounds: Normal breath sounds. No wheezing or rales.  Musculoskeletal:        General: No edema.     Cervical back: Neck supple.  Neurological:     Mental Status: She is alert.     BP 122/76 (BP Location: Left Arm, Patient Position: Sitting, Cuff Size: Normal)   Pulse 90   Temp 98.3 F (36.8 C)   Ht 5' 4.2" (1.631  m)   Wt 158 lb 3.2 oz (71.8 kg)   SpO2 98%   BMI 26.99 kg/m  Wt Readings from Last 3 Encounters:  03/12/20 158 lb 3.2 oz (71.8 kg)  02/28/19 161 lb (73 kg)  12/13/18 163 lb (73.9 kg)     Health Maintenance Due  Topic Date Due  . MAMMOGRAM  Never done  . DEXA SCAN  02/18/2012  . COLONOSCOPY (Pts 45-9yrs Insurance coverage will need to be confirmed)  02/22/2019    There are no preventive care reminders to display for this patient.  Lab Results  Component Value Date   TSH 1.78 06/29/2018   Lab Results  Component Value Date   WBC 10.3 12/11/2018   HGB 14.9 12/11/2018   HCT 45.9 12/11/2018   MCV 93.9 12/11/2018   PLT 200 12/11/2018   Lab Results  Component Value Date   NA 140 12/11/2018   K 4.0 12/11/2018   CO2 25 12/11/2018   GLUCOSE 112 (H) 12/11/2018   BUN 13 12/11/2018   CREATININE 0.88 12/11/2018   BILITOT 0.8 02/14/2015   ALKPHOS 43 02/14/2015   AST 23 02/14/2015   ALT 12 02/14/2015   PROT 6.8 02/14/2015   ALBUMIN 4.1 02/14/2015   CALCIUM 9.5 12/11/2018   ANIONGAP 12 12/11/2018   GFR 68.65 06/29/2018   Lab Results  Component Value Date   CHOL 163 02/14/2015   Lab Results  Component Value Date   HDL 52.70 02/14/2015   Lab Results  Component Value Date   LDLCALC 89 02/14/2015   Lab Results  Component Value Date   TRIG 106.0 02/14/2015   Lab Results  Component Value Date   CHOLHDL 3 02/14/2015   No results found for: HGBA1C    Assessment & Plan:   #1 recent cellulitis left foot resolved with Keflex.  Follow-up promptly for any recurrence  #2 hypertension stable -Refill amlodipine for 1 year  #3  hypothyroidism.  Patient overdue for follow-up labs -Recheck TSH -We will plan to refill levothyroxine for 1 year  Meds ordered this encounter  Medications  . levothyroxine (SYNTHROID) 100 MCG tablet    Sig: TAKE 1 TABLET BY MOUTH DAILY.    Dispense:  90 tablet    Refill:  3  . amLODipine (NORVASC) 2.5 MG tablet    Sig: Take 1 tablet (2.5 mg total) by mouth daily.    Dispense:  90 tablet    Refill:  3    Follow-up: No follow-ups on file.    [name], MD

## 2020-03-22 ENCOUNTER — Telehealth: Payer: Self-pay | Admitting: Pharmacist

## 2020-03-22 NOTE — Chronic Care Management (AMB) (Signed)
Chronic Care Management Pharmacy Assistant   Name: Christy Moore  MRN: [id number] DOB: December 01, 1945  Reason for Encounter: Disease State   Recent office visits:  03.07.2022 Eulas Post MD Family Medicine 02.26.2022 Cydney Ok MD Family medicine   Recent consult visits:  None  Hospital visits:  None in previous 6 months  Medications: Outpatient Encounter Medications as of 03/22/2020  Medication Sig  . amLODipine (NORVASC) 2.5 MG tablet Take 1 tablet (2.5 mg total) by mouth daily.  . famotidine (PEPCID) 40 MG tablet TAKE 1 TABLET BY MOUTH EVERYDAY AT BEDTIME (Patient not taking: Reported on 03/12/2020)  . fluticasone (FLONASE) 50 MCG/ACT nasal spray Place 2 sprays into both nostrils daily as needed for allergies.   Marland Kitchen levothyroxine (SYNTHROID) 100 MCG tablet TAKE 1 TABLET BY MOUTH DAILY.  Marland Kitchen sucralfate (CARAFATE) 1 g tablet Take 1 tablet (1 g total) by mouth 4 (four) times daily -  with meals and at bedtime. (Patient not taking: Reported on 03/12/2020)  . [DISCONTINUED] metoprolol succinate (TOPROL-XL) 25 MG 24 hr tablet 1/2 tablet twice a day  . [DISCONTINUED] mupirocin (BACTROBAN) 2 % nasal ointment by Nasal route 2 (two) times daily. Use one-half of tube in each nostril twice daily for five (5) days. After application, press sides of nose together and gently massage.   . [DISCONTINUED] topiramate (TOPAMAX) 25 MG tablet Take 25 mg by mouth at bedtime.     No facility-administered encounter medications on file as of 03/22/2020.   Reviewed chart prior to disease state call. Spoke with patient regarding BP  Recent Office Vitals: BP Readings from Last 3 Encounters:  03/12/20 122/76  02/28/19 120/68  12/13/18 118/74   Pulse Readings from Last 3 Encounters:  03/12/20 90  02/28/19 87  12/13/18 100    Wt Readings from Last 3 Encounters:  03/12/20 158 lb 3.2 oz (71.8 kg)  02/28/19 161 lb (73 kg)  12/13/18 163 lb (73.9 kg)     Kidney Function Lab Results   Component Value Date/Time   CREATININE 0.86 03/12/2020 09:14 AM   CREATININE 0.88 12/11/2018 01:19 PM   GFR 67.19 03/12/2020 09:14 AM   GFRNONAA >60 12/11/2018 01:19 PM   GFRAA >60 12/11/2018 01:19 PM    BMP Latest Ref Rng & Units 03/12/2020 12/11/2018 10/04/2018  Glucose 70 - 99 mg/dL 90 112(H) 104(H)  BUN 6 - 23 mg/dL 15 13 12   Creatinine 0.40 - 1.20 mg/dL 0.86 0.88 0.74  Sodium 135 - 145 mEq/L 142 140 141  Potassium 3.5 - 5.1 mEq/L 4.2 4.0 3.7  Chloride 96 - 112 mEq/L 104 103 108  CO2 19 - 32 mEq/L 31 25 19(L)  Calcium 8.4 - 10.5 mg/dL 9.1 9.5 9.2    . Current antihypertensive regimen:  o Amlodipine 2.5 mg tablet:daily  . How often are you checking your Blood Pressure? infrequently . Current home BP readings:  o 03.01 122/84 o 03.11 120/70 . What recent interventions/DTPs have been made by any provider to improve Blood Pressure control since last CPP Visit: None . Any recent hospitalizations or ED visits since last visit with CPP? No . What diet changes have been made to improve Blood Pressure Control?  o No Changes . What exercise is being done to improve your Blood Pressure Control?  o No Changes  Adherence Review: Is the patient currently on ACE/ARB medication? No Does the patient have >5 day gap between last estimated fill dates? No   She states she has  been doing well. She has continued to take her medication as written. She denies ED visits since his last CPP follow-up. She also denies any side effects with his medication and any problems with his current pharmacy.  Maia Breslow, [place] Assistant [phone]

## 2020-04-09 DIAGNOSIS — M25562 Pain in left knee: Secondary | ICD-10-CM | POA: Diagnosis not present

## 2020-04-10 ENCOUNTER — Other Ambulatory Visit: Payer: Self-pay

## 2020-04-10 ENCOUNTER — Ambulatory Visit (INDEPENDENT_AMBULATORY_CARE_PROVIDER_SITE_OTHER): Payer: Medicare Other | Admitting: Cardiovascular Disease

## 2020-04-10 ENCOUNTER — Encounter: Payer: Self-pay | Admitting: Cardiovascular Disease

## 2020-04-10 VITALS — BP 128/72 | HR 92 | Ht 64.5 in | Wt 159.0 lb

## 2020-04-10 DIAGNOSIS — I10 Essential (primary) hypertension: Secondary | ICD-10-CM | POA: Diagnosis not present

## 2020-04-10 DIAGNOSIS — M3500 Sicca syndrome, unspecified: Secondary | ICD-10-CM | POA: Diagnosis not present

## 2020-04-10 DIAGNOSIS — I517 Cardiomegaly: Secondary | ICD-10-CM

## 2020-04-10 DIAGNOSIS — I7300 Raynaud's syndrome without gangrene: Secondary | ICD-10-CM

## 2020-04-10 NOTE — Assessment & Plan Note (Signed)
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  Unclear why she would have LVH given that her blood pressure is well-controlled on low-dose of one medication.  We will get an echo to assess for evidence of structural heart disease or pulmonary hypertension.

## 2020-04-10 NOTE — Assessment & Plan Note (Signed)
Blood pressure well have been controlled on amlodipine.

## 2020-04-10 NOTE — Assessment & Plan Note (Signed)
Frequent symptoms despite being on amlodipine.  We will get an echocardiogram to assess for structural heart disease as well as pulmonary hypertension.  If her echo is unremarkable consider increasing amlodipine versus adding low-dose sildenafil.

## 2020-04-10 NOTE — Progress Notes (Deleted)
Cardiology Office Note   Date:  04/10/2020   ID:  Christy Moore, DOB 1945/09/02, MRN [id number]  PCP:  Eulas Post, MD  Cardiologist:   Skeet Latch, MD   No chief complaint on file.     History of Present Illness: Christy Moore is a 75 y.o. female who presents for ***    Past Medical History:  Diagnosis Date  . Cardiomegaly   . GERD (gastroesophageal reflux disease)   . Hypertension   . Hypothyroidism   . Sinus tachycardia     Past Surgical History:  Procedure Laterality Date  . HAND TENDON SURGERY  02/09/2007   tendon transfer-Dr. Amedeo Plenty  . NASAL SEPTUM SURGERY     operated on by Dr. Ernesto Rutherford 20 years ago  . OVARIAN CYST REMOVAL     Right Sided Salpingo-oopherectomy  . SALPINGOOPHORECTOMY       Current Outpatient Medications  Medication Sig Dispense Refill  . amLODipine (NORVASC) 2.5 MG tablet Take 1 tablet (2.5 mg total) by mouth daily. 90 tablet 3  . famotidine (PEPCID) 40 MG tablet     . fluticasone (FLONASE) 50 MCG/ACT nasal spray Place 2 sprays into both nostrils daily as needed for allergies.     Marland Kitchen levothyroxine (SYNTHROID) 100 MCG tablet TAKE 1 TABLET BY MOUTH DAILY. 90 tablet 3   No current facility-administered medications for this visit.    Allergies:   Amoxicillin-pot clavulanate, Azithromycin, Celecoxib, Ciprofloxacin, Doxycycline, Erythromycin, Hydrocodone, Hydrocodone-acetaminophen, Levofloxacin, Penicillins, Propoxyphene n-acetaminophen, Rofecoxib, Sulfa antibiotics, and Tetanus toxoid    Social History:  The patient  reports that she has never smoked. She has never used smokeless tobacco. She reports that she does not drink alcohol and does not use drugs.   Family History:  The patient's ***family history includes Alcohol abuse in her maternal grandfather and maternal grandmother; Allergies in an other family member; Cancer in her father and mother.    ROS:  Please see the history of present illness.   Otherwise, review of  systems are positive for {NONE DEFAULTED:18576::"none"}.   All other systems are reviewed and negative.    PHYSICAL EXAM: VS:  BP 128/72   Pulse 92   Ht 5' 4.5" (1.638 m)   Wt 159 lb (72.1 kg)   SpO2 98%   BMI 26.87 kg/m  , BMI Body mass index is 26.87 kg/m. GENERAL:  Well appearing HEENT:  Pupils equal round and reactive, fundi not visualized, oral mucosa unremarkable NECK:  No jugular venous distention, waveform within normal limits, carotid upstroke brisk and symmetric, no bruits, no thyromegaly LYMPHATICS:  No cervical adenopathy LUNGS:  Clear to auscultation bilaterally HEART:  RRR.  PMI not displaced or sustained,S1 and S2 within normal limits, no S3, no S4, no clicks, no rubs, *** murmurs ABD:  Flat, positive bowel sounds normal in frequency in pitch, no bruits, no rebound, no guarding, no midline pulsatile mass, no hepatomegaly, no splenomegaly EXT:  2 plus pulses throughout, no edema, no cyanosis no clubbing SKIN:  No rashes no nodules NEURO:  Cranial nerves II through XII grossly intact, motor grossly intact throughout PSYCH:  Cognitively intact, oriented to person place and time    EKG:  EKG {ACTION; IS/IS IDP:[id number]} ordered today. The ekg ordered today demonstrates ***   Recent Labs: 03/12/2020: BUN 15; Creatinine, Ser 0.86; Potassium 4.2; Sodium 142; TSH 2.08    Lipid Panel    Component Value Date/Time   CHOL 163 02/14/2015 0936   TRIG 106.0 02/14/2015 0936  HDL 52.70 02/14/2015 0936   CHOLHDL 3 02/14/2015 0936   VLDL 21.2 02/14/2015 0936   LDLCALC 89 02/14/2015 0936      Wt Readings from Last 3 Encounters:  04/10/20 159 lb (72.1 kg)  03/12/20 158 lb 3.2 oz (71.8 kg)  02/28/19 161 lb (73 kg)      ASSESSMENT AND PLAN:  ***   Current medicines are reviewed at length with the patient today.  The patient {ACTIONS; HAS/DOES NOT HAVE:19233} concerns regarding medicines.  The following changes have been made:  {PLAN; NO CHANGE:13088:s}  Labs/  tests ordered today include: *** No orders of the defined types were placed in this encounter.    Disposition:   FU with ***     Signed, Jahnae Mcadoo C. Oval Linsey, MD, [facility]  04/10/2020 1:51 PM    [place]

## 2020-04-10 NOTE — Progress Notes (Signed)
Cardiology Office Note   Date:  04/10/2020   ID:  Christy Moore, DOB 04/19/1945, MRN [id number]  PCP:  Eulas Post, MD  Cardiologist:   Skeet Latch, MD   No chief complaint on file.     History of Present Illness: Christy Moore is a 75 y.o. female with sjogren's disease, hypertension, GERD, and cardiomegaly who presents for hypertension and palpitations. She had a nuclear stress test in 2014 that revealed LVEF 60% and no ischemia.  She is concerned that she is losing tactile coordination in her hands but she contributes this to her Raynaud's syndrome(10years). When she drinks cold liquids her skin(hands) turns blue or pallor, which will last for minutes. She also loses sensation in her hands at times.  This never occurs with exertion.  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 She has been losing strength while holding screws and nails that she eventually drops. Since last year she has noticed a decrease in energy from baseline. While moving, she gets fatigue and feels a pinching sensation in the center and right side of her chest. The pinching is mild in intensity and only lasts a short period of time.  She has no chest pressure or shortness of breath with exertion.  She also has a cyst beneath her kneecap which stops her from completing formal exercise.  She has no shortness of breath, chest pain, pressure, or tightness when lying down. She usually sleeps on her left side due to her GERD and does not move while sleeping. She has noticed that drinking some teas and coffees will give her palpitations. Her father had a heart attack in his 62s. Her mother had a stroke when she was 95 years old, and she also breast cancer that metastasized to her lungs.   Past Medical History:  Diagnosis Date  . Cardiomegaly   . GERD (gastroesophageal reflux disease)   . Hypertension   . Hypothyroidism   . Sinus tachycardia     Past Surgical  History:  Procedure Laterality Date  . HAND TENDON SURGERY  02/09/2007   tendon transfer-Dr. Amedeo Plenty  . NASAL SEPTUM SURGERY     operated on by Dr. Ernesto Rutherford 20 years ago  . OVARIAN CYST REMOVAL     Right Sided Salpingo-oopherectomy  . SALPINGOOPHORECTOMY       Current Outpatient Medications  Medication Sig Dispense Refill  . amLODipine (NORVASC) 2.5 MG tablet Take 1 tablet (2.5 mg total) by mouth daily. 90 tablet 3  . famotidine (PEPCID) 40 MG tablet     . fluticasone (FLONASE) 50 MCG/ACT nasal spray Place 2 sprays into both nostrils daily as needed for allergies.     Marland Kitchen levothyroxine (SYNTHROID) 100 MCG tablet TAKE 1 TABLET BY MOUTH DAILY. 90 tablet 3   No current facility-administered medications for this visit.    Allergies:   Amoxicillin-pot clavulanate, Azithromycin, Celecoxib, Ciprofloxacin, Doxycycline, Erythromycin, Hydrocodone, Hydrocodone-acetaminophen, Levofloxacin, Penicillins, Propoxyphene n-acetaminophen, Rofecoxib, Sulfa antibiotics, and Tetanus toxoid    Social History:  The patient  reports that she has never smoked. She has never used smokeless tobacco. She reports that she does not drink alcohol and does not use drugs.   Family History:  The patient's family history includes Alcohol abuse in her maternal grandfather and maternal grandmother; Allergies in an other family member; Cancer in her father and mother; Heart attack (age of onset: 85) in her father; Hypothyroidism in her mother; Stroke (age of onset:  34) in her mother.    ROS:  Please see the history of present illness. All other systems are reviewed and negative.    PHYSICAL EXAM: VS:  BP 128/72   Pulse 92   Ht 5' 4.5" (1.638 m)   Wt 159 lb (72.1 kg)   SpO2 98%   BMI 26.87 kg/m  , BMI Body mass index is 26.87 kg/m. GENERAL:  Well appearing HEENT:  Pupils equal round and reactive, fundi not visualized, oral mucosa unremarkable NECK:  No jugular venous distention, waveform within normal limits,  carotid upstroke brisk and symmetric, no bruits LUNGS:  Clear to auscultation bilaterally HEART:  RRR.  PMI not displaced or sustained,S1 and S2 within normal limits, no S3, no S4, no clicks, no rubs, no murmurs ABD:  Flat, positive bowel sounds normal in frequency in pitch, no bruits, no rebound, no guarding, no midline pulsatile mass, no hepatomegaly, no splenomegaly EXT:  2 plus pulses throughout, no edema, no cyanosis no clubbing SKIN:  No rashes no nodules NEURO:  Cranial nerves II through XII grossly intact, motor grossly intact throughout [facility]:  Cognitively intact, oriented to person place and time   EKG:   04/10/20-Sinus rhythm, rate 92 bpm, RBB and LAFB, LVH with repolarization  Recent Labs: 03/12/2020: BUN 15; Creatinine, Ser 0.86; Potassium 4.2; Sodium 142; TSH 2.08    Lipid Panel    Component Value Date/Time   CHOL 163 02/14/2015 0936   TRIG 106.0 02/14/2015 0936   HDL 52.70 02/14/2015 0936   CHOLHDL 3 02/14/2015 0936   VLDL 21.2 02/14/2015 0936   LDLCALC 89 02/14/2015 0936      Wt Readings from Last 3 Encounters:  04/10/20 159 lb (72.1 kg)  03/12/20 158 lb 3.2 oz (71.8 kg)  02/28/19 161 lb (73 kg)      ASSESSMENT AND PLAN: Hypertension Blood pressure well have been controlled on amlodipine.  Cardiomegaly LVH noted on EKG.  Unclear why she would have LVH given that her blood pressure is well-controlled on low-dose of one medication.  We will get an echo to assess for evidence of structural heart disease or pulmonary hypertension.  Raynaud's disease Frequent symptoms despite being on amlodipine.  We will get an echocardiogram to assess for structural heart disease as well as pulmonary hypertension.  If her echo is unremarkable consider increasing amlodipine versus adding low-dose sildenafil.  Current medicines are reviewed at length with the patient today.  The patient does not have concerns regarding medicines.  The following changes have been made:  no  change  Labs/ tests ordered today include:   Orders Placed This Encounter  Procedures  . EKG 12-Lead  . ECHOCARDIOGRAM COMPLETE    Disposition:   FU with in 2 months for APP  I,Mathew Stumpf,acting as a scribe for Skeet Latch, MD.,have documented all relevant documentation on the behalf of Skeet Latch, MD,as directed by  Skeet Latch, MD while in the presence of Skeet Latch, MD.  Signed, [place]. Oval Linsey, MD, [facility]  04/10/2020 4:54 PM    [place]

## 2020-04-10 NOTE — Patient Instructions (Addendum)
Medication Instructions:  Your physician recommends that you continue on your current medications as directed. Please refer to the Current Medication list given to you today.  *If you need a refill on your cardiac medications before your next appointment, please call your pharmacy*  Lab Work: NONE   Testing/Procedures: Your physician has requested that you have an echocardiogram. Echocardiography is a painless test that uses sound waves to create images of your heart. It provides your doctor with information about the size and shape of your heart and how well your heart's chambers and valves are working. This procedure takes approximately one hour. There are no restrictions for this procedure.  [address]   Follow-Up: At [facility], you and your health needs are our priority.  As part of our continuing mission to provide you with exceptional heart care, we have created designated Provider Care Teams.  These Care Teams include your primary Cardiologist (physician) and Advanced Practice Providers (APPs -  Physician Assistants and Nurse Practitioners) who all work together to provide you with the care you need, when you need it.  We recommend signing up for the patient portal called "MyChart".  Sign up information is provided on this After Visit Summary.  MyChart is used to connect with patients for Virtual Visits (Telemedicine).  Patients are able to view lab/test results, encounter notes, upcoming appointments, etc.  Non-urgent messages can be sent to your provider as well.   To learn more about what you can do with MyChart, go to [email or web address].    Your next appointment:   2 month(s)  The format for your next appointment:   In Person  Provider:   You may see DR [facility]  or one of the following Advanced Practice Providers on your designated Care Team:    Sande Rives, PA-C  [name], FNP    Echocardiogram An echocardiogram is a  test that uses sound waves (ultrasound) to produce images of the heart. Images from an echocardiogram can provide important information about:  Heart size and shape.  The size and thickness and movement of your heart's walls.  Heart muscle function and strength.  Heart valve function or if you have stenosis. Stenosis is when the heart valves are too narrow.  If blood is flowing backward through the heart valves (regurgitation).  A tumor or infectious growth around the heart valves.  Areas of heart muscle that are not working well because of poor blood flow or injury from a heart attack.  Aneurysm detection. An aneurysm is a weak or damaged part of an artery wall. The wall bulges out from the normal force of blood pumping through the body. Tell a health care provider about:  Any allergies you have.  All medicines you are taking, including vitamins, herbs, eye drops, creams, and over-the-counter medicines.  Any blood disorders you have.  Any surgeries you have had.  Any medical conditions you have.  Whether you are pregnant or may be pregnant. What are the risks? Generally, this is a safe test. However, problems may occur, including an allergic reaction to dye (contrast) that may be used during the test. What happens before the test? No specific preparation is needed. You may eat and drink normally. What happens during the test?  You will take off your clothes from the waist up and put on a hospital gown.  Electrodes or electrocardiogram (ECG)patches may be placed on your chest. The electrodes or patches are then connected to  a device that monitors your heart rate and rhythm.  You will lie down on a table for an ultrasound exam. A gel will be applied to your chest to help sound waves pass through your skin.  A handheld device, called a transducer, will be pressed against your chest and moved over your heart. The transducer produces sound waves that travel to your heart and  bounce back (or "echo" back) to the transducer. These sound waves will be captured in real-time and changed into images of your heart that can be viewed on a video monitor. The images will be recorded on a computer and reviewed by your health care provider.  You may be asked to change positions or hold your breath for a short time. This makes it easier to get different views or better views of your heart.  In some cases, you may receive contrast through an IV in one of your veins. This can improve the quality of the pictures from your heart. The procedure may vary among health care providers and hospitals.   What can I expect after the test? You may return to your normal, everyday life, including diet, activities, and medicines, unless your health care provider tells you not to do that. Follow these instructions at home:  It is up to you to get the results of your test. Ask your health care provider, or the department that is doing the test, when your results will be ready.  Keep all follow-up visits. This is important. Summary  An echocardiogram is a test that uses sound waves (ultrasound) to produce images of the heart.  Images from an echocardiogram can provide important information about the size and shape of your heart, heart muscle function, heart valve function, and other possible heart problems.  You do not need to do anything to prepare before this test. You may eat and drink normally.  After the echocardiogram is completed, you may return to your normal, everyday life, unless your health care provider tells you not to do that. This information is not intended to replace advice given to you by your health care provider. Make sure you discuss any questions you have with your health care provider. Document Revised: 08/16/2019 Document Reviewed: 08/16/2019 Elsevier Patient Education  2021 [employer].

## 2020-04-20 DIAGNOSIS — Z23 Encounter for immunization: Secondary | ICD-10-CM | POA: Diagnosis not present

## 2020-04-25 DIAGNOSIS — H35363 Drusen (degenerative) of macula, bilateral: Secondary | ICD-10-CM | POA: Diagnosis not present

## 2020-04-25 DIAGNOSIS — H2513 Age-related nuclear cataract, bilateral: Secondary | ICD-10-CM | POA: Diagnosis not present

## 2020-05-02 DIAGNOSIS — M1711 Unilateral primary osteoarthritis, right knee: Secondary | ICD-10-CM | POA: Diagnosis not present

## 2020-05-02 DIAGNOSIS — M25562 Pain in left knee: Secondary | ICD-10-CM | POA: Diagnosis not present

## 2020-05-11 DIAGNOSIS — S83242A Other tear of medial meniscus, current injury, left knee, initial encounter: Secondary | ICD-10-CM | POA: Diagnosis not present

## 2020-05-11 DIAGNOSIS — S83242D Other tear of medial meniscus, current injury, left knee, subsequent encounter: Secondary | ICD-10-CM | POA: Diagnosis not present

## 2020-05-22 ENCOUNTER — Other Ambulatory Visit: Payer: Self-pay

## 2020-05-22 ENCOUNTER — Ambulatory Visit (HOSPITAL_COMMUNITY): Payer: Medicare Other | Attending: Cardiology

## 2020-05-22 DIAGNOSIS — I517 Cardiomegaly: Secondary | ICD-10-CM | POA: Diagnosis not present

## 2020-05-22 DIAGNOSIS — I10 Essential (primary) hypertension: Secondary | ICD-10-CM | POA: Insufficient documentation

## 2020-05-22 LAB — ECHOCARDIOGRAM COMPLETE
Area-P 1/2: 7.16 cm2
S' Lateral: 3.6 cm

## 2020-06-07 ENCOUNTER — Telehealth: Payer: Self-pay | Admitting: Gastroenterology

## 2020-06-07 ENCOUNTER — Telehealth: Payer: Self-pay | Admitting: Pharmacist

## 2020-06-07 NOTE — Telephone Encounter (Signed)
Good afternoon Dr. Ardis Hughs, patient called wanting to schedule an appointment with you since her husband is a patient of yours.  She saw Dr. Carlean Purl in 2004 then went to other GI practice.  States all her doctors are within [facility] and would like to transfer back.  Can you please advise on scheduling?  Thank you.

## 2020-06-07 NOTE — Telephone Encounter (Signed)
I am happy to see her as a new patient (my first available, do not double book, not with extender) if Dr. Carlean Purl is OK with that

## 2020-06-07 NOTE — Telephone Encounter (Signed)
Dr. Carlean Purl ok with you?

## 2020-06-07 NOTE — Chronic Care Management (AMB) (Signed)
    Chronic Care Management Pharmacy Assistant   Name: CYLIE DOR  MRN: [id number] DOB: 1945-01-21  06/07/20- Called patient to remind of appointment with Jeni Salles) on (06/08/20 at 12 pm by phone)   No answer, left message of appointment date, time and type of appointment (either telephone or in person). Left message to have all medications, supplements, blood pressure and/or blood sugar logs available during appointment and to return call if need to reschedule.  Star Rating Drug:  None.   [place]  Clinical Pharmacist Assistant [phone]

## 2020-06-08 ENCOUNTER — Ambulatory Visit (INDEPENDENT_AMBULATORY_CARE_PROVIDER_SITE_OTHER): Payer: Medicare Other | Admitting: Pharmacist

## 2020-06-08 ENCOUNTER — Encounter: Payer: Self-pay | Admitting: Gastroenterology

## 2020-06-08 DIAGNOSIS — E038 Other specified hypothyroidism: Secondary | ICD-10-CM | POA: Diagnosis not present

## 2020-06-08 DIAGNOSIS — I10 Essential (primary) hypertension: Secondary | ICD-10-CM

## 2020-06-08 DIAGNOSIS — Z780 Asymptomatic menopausal state: Secondary | ICD-10-CM

## 2020-06-08 NOTE — Telephone Encounter (Signed)
AOK w/ me

## 2020-06-08 NOTE — Progress Notes (Signed)
Chronic Care Management Pharmacy Note  06/11/2020 Name:  Christy Moore MRN:  [id number] DOB:  [birth year]  Summary: BP is not at goal < 140/90 per home readings  Recommendations/Changes made from today's visit: -Recommended repeat DEXA scan and vitamin D level -Recommended BP monitoring at home 2-3 times a week  Plan: -BP assessment in 2-3 weeks  Subjective: Christy Moore is an 75 y.o. year old female who is a primary patient of Burchette, Alinda Sierras, MD.  The CCM team was consulted for assistance with disease management and care coordination needs.    Engaged with patient by telephone for follow up visit in response to provider referral for pharmacy case management and/or care coordination services.   Consent to Services:  The patient was given information about Chronic Care Management services, agreed to services, and gave verbal consent prior to initiation of services.  Please see initial visit note for detailed documentation.   Patient Care Team: Eulas Post, MD as PCP - General (Family Medicine) Skeet Latch, MD as PCP - Cardiology (Cardiology) Viona Gilmore, [facility] as Pharmacist (Pharmacist)  Recent office visits: 03/12/20 Christy Littler, MD: Patient presented for chronic conditions and cellulitis follow up.   Recent consult visits: 05/11/20 Christy Sauer, PA ([facility]): Patient presented for tear of meniscus in knee.  05/02/20 Christy Cabal, MD ([facility]): Patient presented for osteoarthritis.  04/10/20 Skeet Latch, MD (cardiology): Patient presented for Raynaud's follow up. Plan for repeat echo.  04/09/20 Christy Cabal, MD ([facility]): Patient presented for osteoarthritis.  03/03/20 Christy Blinks, MD (family medicine): Patient presented for video visit with cellulitis. Prescribed Keflex.  Hospital visits: None in previous 6 months   Objective:  Lab Results  Component Value Date   CREATININE 0.86 03/12/2020   BUN 15 03/12/2020   GFR  67.19 03/12/2020   GFRNONAA >60 12/11/2018   GFRAA >60 12/11/2018   NA 142 03/12/2020   K 4.2 03/12/2020   CALCIUM 9.1 03/12/2020   CO2 31 03/12/2020   GLUCOSE 90 03/12/2020    Lab Results  Component Value Date/Time   GFR 67.19 03/12/2020 09:14 AM   GFR 68.65 06/29/2018 09:45 AM    Last diabetic Eye exam:  Lab Results  Component Value Date/Time   HMDIABEYEEXA No Retinopathy 02/05/2015 12:00 AM    Last diabetic Foot exam: No results found for: HMDIABFOOTEX   Lab Results  Component Value Date   CHOL 163 02/14/2015   HDL 52.70 02/14/2015   LDLCALC 89 02/14/2015   TRIG 106.0 02/14/2015   CHOLHDL 3 02/14/2015    Hepatic Function Latest Ref Rng & Units 02/14/2015 01/22/2012 02/26/2010  Total Protein 6.0 - 8.3 g/dL 6.8 7.1 6.9  Albumin 3.5 - 5.2 g/dL 4.1 4.1 4.3  AST 0 - 37 U/L 23 21 29   ALT 0 - 35 U/L 12 14 20   Alk Phosphatase 39 - 117 U/L 43 43 45  Total Bilirubin 0.2 - 1.2 mg/dL 0.8 0.8 0.5  Bilirubin, Direct 0.0 - 0.3 mg/dL 0.1 0.0 0.0    Lab Results  Component Value Date/Time   TSH 2.08 03/12/2020 09:14 AM   TSH 1.78 06/29/2018 09:45 AM    CBC Latest Ref Rng & Units 12/11/2018 10/04/2018 02/14/2015  WBC 4.0 - 10.5 K/uL 10.3 8.9 5.7  Hemoglobin 12.0 - 15.0 g/dL 14.9 15.4(H) 14.3  Hematocrit 36.0 - 46.0 % 45.9 49.8(H) 43.1  Platelets 150 - 400 K/uL 200 192 186.0    No results found for: VD25OH  Clinical ASCVD: No  The ASCVD Risk score Mikey Bussing DC Brooke Bonito., et al., 2013) failed to calculate for the following reasons:   Cannot find a previous HDL lab   Cannot find a previous total cholesterol lab    Depression screen [facility] 2/9 10/17/2019 05/27/2017 05/23/2016  Decreased Interest 0 3 1  Down, Depressed, Hopeless 0 3 0  PHQ - 2 Score 0 6 1  Altered sleeping 0 0 -  Tired, decreased energy 0 0 -  Change in appetite 0 1 -  Feeling bad or failure about yourself  0 0 -  Trouble concentrating 0 0 -  Moving slowly or fidgety/restless 0 0 -  Suicidal thoughts 0 0 -  PHQ-9 Score 0  7 -  Difficult doing work/chores Not difficult at all Very difficult -      Social History   Tobacco Use  Smoking Status Never Smoker  Smokeless Tobacco Never Used   BP Readings from Last 3 Encounters:  04/10/20 128/72  03/12/20 122/76  02/28/19 120/68   Pulse Readings from Last 3 Encounters:  04/10/20 92  03/12/20 90  02/28/19 87   Wt Readings from Last 3 Encounters:  04/10/20 159 lb (72.1 kg)  03/12/20 158 lb 3.2 oz (71.8 kg)  02/28/19 161 lb (73 kg)   BMI Readings from Last 3 Encounters:  04/10/20 26.87 kg/m  03/12/20 26.99 kg/m  02/28/19 27.21 kg/m    Assessment/Interventions: Review of patient past medical history, allergies, medications, health status, including review of consultants reports, laboratory and other test data, was performed as part of comprehensive evaluation and provision of chronic care management services.   SDOH:  (Social Determinants of Health) assessments and interventions performed: No  SDOH Screenings   Alcohol Screen: Low Risk   . Last Alcohol Screening Score (AUDIT): 1  Depression (PHQ2-9): Low Risk   . PHQ-2 Score: 0  Financial Resource Strain: Low Risk   . Difficulty of Paying Living Expenses: Not hard at all  Food Insecurity: No Food Insecurity  . Worried About Charity fundraiser in the Last Year: Never true  . Ran Out of Food in the Last Year: Never true  Housing: Low Risk   . Last Housing Risk Score: 0  Physical Activity: Inactive  . Days of Exercise per Week: 0 days  . Minutes of Exercise per Session: 0 min  Social Connections: Moderately Isolated  . Frequency of Communication with Friends and Family: Once a week  . Frequency of Social Gatherings with Friends and Family: Once a week  . Attends Religious Services: More than 4 times per year  . Active Member of Clubs or Organizations: No  . Attends Archivist Meetings: Never  . Marital Status: Married  Stress: No Stress Concern Present  . Feeling of Stress :  Not at all  Tobacco Use: Low Risk   . Smoking Tobacco Use: Never Smoker  . Smokeless Tobacco Use: Never Used  Transportation Needs: No Transportation Needs  . Lack of Transportation (Medical): No  . Lack of Transportation (Non-Medical): No    CCM Care Plan  Allergies  Allergen Reactions  . Amoxicillin-Pot Clavulanate     REACTION: nausea  . Azithromycin     REACTION: nausea, Gi  . Celecoxib     REACTION: nausea  . Ciprofloxacin     REACTION: nausea  . Doxycycline     REACTION: gi upset  . Erythromycin     REACTION: nause, GI  . Hydrocodone     REACTION: nausea  .  Hydrocodone-Acetaminophen     REACTION: GI upset  . Levofloxacin     REACTION: she claims renal failiure  . Penicillins   . Propoxyphene N-Acetaminophen   . Rofecoxib     REACTION: nausea  . Sulfa Antibiotics   . Tetanus Toxoid     Medications Reviewed Today    Reviewed by Viona Gilmore, [facility] (Pharmacist) on 06/08/20 at 1212  Med List Status: <None>  Medication Order Taking? Sig Documenting Provider Last Dose Status Informant  amLODipine (NORVASC) 2.5 MG tablet [id number] Yes Take 1 tablet (2.5 mg total) by mouth daily. Eulas Post, MD Taking Active   famotidine (PEPCID) 40 MG tablet [id number] Yes Take 60 mg by mouth daily. [provider] Taking Active   fluticasone (FLONASE) 50 MCG/ACT nasal spray [id number]  Place 2 sprays into both nostrils daily as needed for allergies.  [provider]  Active Self  levothyroxine (SYNTHROID) 100 MCG tablet [id number]  TAKE 1 TABLET BY MOUTH DAILY. Eulas Post, MD  Active         Discontinued 03/19/11 1620 (Error)         Discontinued 03/19/11 1620 (Error)         Discontinued 03/19/11 1620 (Error)           Patient Active Problem List   Diagnosis Date Noted  . Thrush 11/03/2012  . Sjogren's disease ([place]) 01/22/2012  . GERD (gastroesophageal reflux disease) 03/19/2011  . Hypertension 03/19/2011  . Raynaud's disease 03/19/2011   . Cardiomegaly 02/26/2010  . Hypothyroidism 02/26/2010  . Wrist fracture, left 02/26/2010  . Liver hemangioma 02/26/2010  . Migraine 02/26/2010  . Esophageal stricture 02/26/2010  . Weight loss 02/26/2010  . OTHER CHRONIC SINUSITIS 12/20/2007  . Rosacea 12/20/2007    Immunization History  Administered Date(s) Administered  . Fluad Quad(high Dose 65+) 09/16/2018, 10/03/2019  . Influenza Split 11/22/2011  . Influenza, High Dose Seasonal PF 09/14/2014, 09/07/2015, 10/07/2016, 10/26/2017, 09/16/2018  . Influenza,inj,Quad PF,6+ Mos 10/05/2013  . Influenza,inj,quad, With Preservative 10/06/2016, 11/02/2017  . Influenza-Unspecified 11/22/2011, 10/05/2013, 09/14/2014, 09/07/2015, 10/06/2016, 10/07/2016, 10/26/2017, 11/02/2017  . PFIZER(Purple Top)SARS-COV-2 Vaccination 02/11/2019, 03/08/2019, 10/04/2019, 04/20/2020  . Pneumococcal Conjugate-13 10/05/2013  . Pneumococcal Polysaccharide-23 02/19/2015  . Zoster, Live 01/17/2010     Conditions to be addressed/monitored:  Hypertension, GERD, Hypothyroidism and Allergic Rhinitis  Care Plan : CCM Pharmacy Care Plan  Updates made by Viona Gilmore, [place] since 06/11/2020 12:00 AM    Problem: Problem: Hypertension, GERD, Hypothyroidism and Allergic Rhinitis     Long-Range Goal: Patient-Specific Goal   Start Date: 06/08/2020  Expected End Date: 06/08/2021  This Visit's Progress: On track  Priority: High  Note:   Current Barriers:  . Unable to maintain control of blood pressure  Pharmacist Clinical Goal(s):  Marland Kitchen Patient will maintain control of blood pressure as evidenced by home blood pressure readings  through collaboration with PharmD and provider.   Interventions: . 1:1 collaboration with Eulas Post, MD regarding development and update of comprehensive plan of care as evidenced by provider attestation and co-signature . Inter-disciplinary care team collaboration (see longitudinal plan of care) . Comprehensive medication review  performed; medication list updated in electronic medical record  Hypertension/Raynaud's syndrome (BP goal <140/90) -Controlled -Current treatment:  Amlodipine 2.5 mg 1 tablet daily -Medications previously tried: metoprolol (for HR) -Current home readings: 164/68, 155/80 -Current dietary habits: not eating as much -Current exercise habits: not quite as active lately -Denies hypotensive/hypertensive symptoms -Educated on BP goals and benefits of medications  for prevention of heart attack, stroke and kidney damage; Exercise goal of 150 minutes per week; Importance of home blood pressure monitoring; Proper BP monitoring technique; -Counseled to monitor BP at home weekly, document, and provide log at future appointments -Counseled on diet and exercise extensively Recommended to continue current medication Plan for BP assessment in a few weeks.  GERD (Goal: minimize symptoms) -Controlled -Current treatment   Famotidine 40 mg 1/2 tablet in the morning and 1 tablet before bedtime  Gaviscon as needed -Medications previously tried: Pantoprazole (hurt stomach)  -Counseled on non-pharmacologic management of symptoms such as elevating the head of your bed, avoiding eating 2-3 hours before bed, avoiding triggering foods such as acidic, spicy, or fatty foods, eating smaller meals, and wearing clothes that are loose around the waist   Hypothyroidism (Goal: TSH 0.35-4.5) -Controlled -Current treatment  . Levothyroxine 100 mcg 1 tablet daily -Medications previously tried: none  -Recommended to continue current medication  Allergic rhinitis (Goal: minimize symptoms) -Controlled -Current treatment   Flonase 50 mcg/act 2 sprays in both nostrils as needed  Flonase sensimist as needed -Medications previously tried: none  -Recommended to continue current medication  Bone health (Goal prevent fractures) -Uncontrolled -Last DEXA Scan: 2007  T-Score femoral neck: unable to access  T-Score  total hip: unable to access  T-Score lumbar spine: unable to access  T-Score forearm radius: unable to access  10-year probability of major osteoporotic fracture: unable to access  10-year probability of hip fracture: unable to access -Patient is not a candidate for pharmacologic treatment -Current treatment  . No medications -Medications previously tried: none  -Recommend [phone] units of vitamin D daily. Recommend 1200 mg of calcium daily from dietary and supplemental sources. Recommend weight-bearing and muscle strengthening exercises for building and maintaining bone density. -Recommended repeat DEXA and vitamin D level   Health Maintenance -Vaccine gaps: Shingrix -Current therapy:  . No medications -Educated on Cost vs benefit of each product must be carefully weighed by individual consumer -Patient is satisfied with current therapy and denies issues -Recommended to continue as is  Patient Goals/Self-Care Activities . Patient will:  - take medications as prescribed check blood pressure a few times a week, document, and provide at future appointments target a minimum of 150 minutes of moderate intensity exercise weekly  Follow Up Plan: The care management team will reach out to the patient again over the next 30 days.       Medication Assistance: None required.  Patient affirms current coverage meets needs.  Compliance/Adherence/Medication fill history: Care Gaps: Shingrix, mammogram, DEXA, colonoscopy  Star-Rating Drugs: None  Patient's preferred pharmacy is:  [id number] [place], [place] - [address]Phone: [phone]: [phone] Uses pill box? Yes Pt endorses 100% compliance  We discussed: Current pharmacy is preferred with insurance plan and patient is satisfied with pharmacy services Patient decided to: Continue current medication management  strategy  Care Plan and Follow Up Patient Decision:  Patient agrees to Care Plan and Follow-up.  Plan: The care management team will reach out to the patient again over the next 30 days.  MJeni Salles PharmD [facility] BelhavenClinical Pharmacist [place] 2-3 weeks -

## 2020-06-08 NOTE — Telephone Encounter (Signed)
Thank you both.  Patient is scheduled for 07/13/20.

## 2020-06-11 NOTE — Addendum Note (Signed)
Addended by: Eulas Post on: 06/11/2020 05:55 PM   Modules accepted: Orders

## 2020-06-11 NOTE — Progress Notes (Signed)
Cardiology Clinic Note   Patient Name: Christy Moore Date of Encounter: 06/12/2020  Primary Care Provider:  Eulas Post, MD Primary Cardiologist:  Skeet Latch, MD  Patient Profile    Christy Moore 75 year old female presents the clinic today for follow-up evaluation of her hypertension, palpitations, and cardiomegaly.  Past Medical History    Past Medical History:  Diagnosis Date  . Cardiomegaly   . GERD (gastroesophageal reflux disease)   . Hypertension   . Hypothyroidism   . Sinus tachycardia    Past Surgical History:  Procedure Laterality Date  . HAND TENDON SURGERY  02/09/2007   tendon transfer-Dr. Amedeo Plenty  . NASAL SEPTUM SURGERY     operated on by Dr. Ernesto Rutherford 20 years ago  . OVARIAN CYST REMOVAL     Right Sided Salpingo-oopherectomy  . SALPINGOOPHORECTOMY      Allergies  Allergies  Allergen Reactions  . Amoxicillin-Pot Clavulanate     REACTION: nausea  . Azithromycin     REACTION: nausea, Gi  . Celecoxib     REACTION: nausea  . Ciprofloxacin     REACTION: nausea  . Doxycycline     REACTION: gi upset  . Erythromycin     REACTION: nause, GI  . Hydrocodone     REACTION: nausea  . Hydrocodone-Acetaminophen     REACTION: GI upset  . Levofloxacin     REACTION: she claims renal failiure  . Penicillins   . Propoxyphene N-Acetaminophen   . Rofecoxib     REACTION: nausea  . Sulfa Antibiotics   . Tetanus Toxoid     History of Present Illness    Christy Moore has a PMH of Sjogren's syndrome, hypertension, GERD, palpitations, and cardiomegaly.  She underwent nuclear stress testing 2014 that showed LVEF of 60% and no ischemia.  She was last seen by Dr. Oval Linsey on 04/10/2020.  During that time she reported that she was concerned about losing coordination in her hands which she attributed to Raynaud's syndrome (10 years).  She reported that when she drinks cold liquids her hands turn blue which dissipates after a few minutes.  She also  reported that she loses sensation in her hands.  She reports that this does not occur with exertion.  She was physically active helping her husband with construction work and gardening.  She noted that she had been losing strength in her hands.  She also noticed a decrease in her energy over the past year.  She described a pinching type sensation in the center of her right chest with physical activity.  She described it as mildly intense and lasting for short period of time.  She denied chest pain or pressure with increased physical activity.  She has a cyst beneath her kneecap that stops her from completing formal exercise.  She denied shortness of breath, chest pain, pressure, and tightness when laying down.  She reported palpitations with drinking tea and coffee.  Her father had heart attack in his 65s and her mother had CVA when she was 4.  Her mother also had breast cancer that metastasized to her lungs.  An echocardiogram was ordered 05/22/2020 and showed an EF of 50-55%, G1 DD, and mild mitral valve regurgitation.  She presents to the clinic today for follow-up evaluation states she is still having trouble with her Raynaud's syndrome.  She notices pallor and blue digits with holding cold drinks.  She continues to notice loss of tactile sensation in her fingers.  She reports that  she stays very physically active growing a very large vegetable garden.  She also has several fruit trees, berry bushes, and is excited about making pickles this summer.  She has noticed she also has posterior headaches and was previously seen by neurology.  She describes a vasodilator powder that she previously used that helped with her headaches.  I have asked her to follow-up with neurology for further evaluation of her migraines/headaches.  We will increase her amlodipine, give her the salty 6 diet sheet, have her maintain her physical activity and follow-up with Dr. Oval Linsey in 6 months.  Today she denies chest pain,  shortness of breath, lower extremity edema, fatigue, palpitations, melena, hematuria, hemoptysis, diaphoresis, weakness, presyncope, syncope, orthopnea, and PND.     Home Medications    Prior to Admission medications   Medication Sig Start Date End Date Taking? Authorizing Provider  amLODipine (NORVASC) 2.5 MG tablet Take 1 tablet (2.5 mg total) by mouth daily. 03/12/20   Burchette, Alinda Sierras, MD  famotidine (PEPCID) 40 MG tablet Take 60 mg by mouth daily. 10/12/19   [provider]  fluticasone (FLONASE) 50 MCG/ACT nasal spray Place 2 sprays into both nostrils daily as needed for allergies.     [provider]  levothyroxine (SYNTHROID) 100 MCG tablet TAKE 1 TABLET BY MOUTH DAILY. 03/12/20   Burchette, Alinda Sierras, MD  metoprolol succinate (TOPROL-XL) 25 MG 24 hr tablet 1/2 tablet twice a day 03/17/11 03/19/11  Burnice Logan, MD  mupirocin [facility]) 2 % nasal ointment by Nasal route 2 (two) times daily. Use one-half of tube in each nostril twice daily for five (5) days. After application, press sides of nose together and gently massage.   03/19/11  [provider]  topiramate (TOPAMAX) 25 MG tablet Take 25 mg by mouth at bedtime.    03/19/11  [provider]    Family History    Family History  Problem Relation Age of Onset  . Allergies Other        Grandmother-pt states she takes after grandmother who had multiple allergies  . Cancer Mother        breast and lung  . Stroke Mother 53  . Hypothyroidism Mother   . Cancer Father        prostate  . Heart attack Father 26  . Alcohol abuse Maternal Grandmother   . Alcohol abuse Maternal Grandfather    She indicated that the status of her mother is unknown. She indicated that the status of her father is unknown. She indicated that the status of her maternal grandmother is unknown. She indicated that the status of her maternal grandfather is unknown. She indicated that the status of her other is unknown.  Social  History    Social History   Socioeconomic History  . Marital status: Married    Spouse name: Not on file  . Number of children: Not on file  . Years of education: Not on file  . Highest education level: Not on file  Occupational History  . Not on file  Tobacco Use  . Smoking status: Never Smoker  . Smokeless tobacco: Never Used  Vaping Use  . Vaping Use: Never used  Substance and Sexual Activity  . Alcohol use: No  . Drug use: No  . Sexual activity: Not on file  Other Topics Concern  . Not on file  Social History Narrative  . Not on file   Social Determinants of Health   Financial Resource Strain: Low  Risk   . Difficulty of Paying Living Expenses: Not hard at all  Food Insecurity: No Food Insecurity  . Worried About Charity fundraiser in the Last Year: Never true  . Ran Out of Food in the Last Year: Never true  Transportation Needs: No Transportation Needs  . Lack of Transportation (Medical): No  . Lack of Transportation (Non-Medical): No  Physical Activity: Inactive  . Days of Exercise per Week: 0 days  . Minutes of Exercise per Session: 0 min  Stress: No Stress Concern Present  . Feeling of Stress : Not at all  Social Connections: Moderately Isolated  . Frequency of Communication with Friends and Family: Once a week  . Frequency of Social Gatherings with Friends and Family: Once a week  . Attends Religious Services: More than 4 times per year  . Active Member of Clubs or Organizations: No  . Attends Archivist Meetings: Never  . Marital Status: Married  Human resources officer Violence: Not At Risk  . Fear of Current or Ex-Partner: No  . Emotionally Abused: No  . Physically Abused: No  . Sexually Abused: No     Review of Systems    General:  No chills, fever, night sweats or weight changes.  Cardiovascular:  No chest pain, dyspnea on exertion, edema, orthopnea, palpitations, paroxysmal nocturnal dyspnea. Dermatological: No rash,  lesions/masses Respiratory: No cough, dyspnea Urologic: No hematuria, dysuria Abdominal:   No nausea, vomiting, diarrhea, bright red blood per rectum, melena, or hematemesis Neurologic:  No visual changes, wkns, changes in mental status. All other systems reviewed and are otherwise negative except as noted above.  Physical Exam    VS:  BP 130/80 (BP Location: Left Arm)   Pulse 75   Ht 5\' 4"  (1.626 m)   Wt 156 lb 6.4 oz (70.9 kg)   SpO2 98%   BMI 26.85 kg/m  , BMI Body mass index is 26.85 kg/m. GEN: Well nourished, well developed, in no acute distress. HEENT: normal. Neck: Supple, no JVD, carotid bruits, or masses. Cardiac: RRR, no murmurs, rubs, or gallops. No clubbing, cyanosis, edema.  Radials/DP/PT 2+ and equal bilaterally.  Respiratory:  Respirations regular and unlabored, clear to auscultation bilaterally. GI: Soft, nontender, nondistended, BS + x 4. MS: no deformity or atrophy. Skin: warm and dry, no rash. Neuro:  Strength and sensation are intact. Psych: Normal affect.  Accessory Clinical Findings    Recent Labs: 03/12/2020: BUN 15; Creatinine, Ser 0.86; Potassium 4.2; Sodium 142; TSH 2.08   Recent Lipid Panel    Component Value Date/Time   CHOL 163 02/14/2015 0936   TRIG 106.0 02/14/2015 0936   HDL 52.70 02/14/2015 0936   CHOLHDL 3 02/14/2015 0936   VLDL 21.2 02/14/2015 0936   LDLCALC 89 02/14/2015 0936    ECG personally reviewed by me today-none today.    Echocardiogram 05/22/2020  IMPRESSIONS    1. Left ventricular ejection fraction, by estimation, is 50 to 55%. The  left ventricle has low normal function. The left ventricle has no regional  wall motion abnormalities. Left ventricular diastolic parameters are  consistent with Grade I diastolic  dysfunction (impaired relaxation). Elevated left atrial pressure. The  average left ventricular global longitudinal strain is -22.3 %. The global  longitudinal strain is normal.  2. Right ventricular systolic  function is normal. The right ventricular  size is normal. Tricuspid regurgitation signal is inadequate for assessing  PA pressure.  3. The mitral valve is normal in structure. Mild mitral valve  regurgitation. No evidence of mitral stenosis.  4. The aortic valve is tricuspid. Aortic valve regurgitation is not  visualized. Mild aortic valve sclerosis is present, with no evidence of  aortic valve stenosis.  5. The inferior vena cava is normal in size with greater than 50%  respiratory variability, suggesting right atrial pressure of 3 mmHg.  Assessment & Plan   1.  Cardiomegaly- echocardiogram 05/22/2020 showed EF 50-55%, G1 DD, and mild mitral valve regurgitation.  Recommendations plan for good blood pressure control. Continue amlodipine Heart healthy low-sodium diet-salty 6 given Increase physical activity as tolerated  Essential hypertension-BP today 130/80.  Well-controlled at home. Increase amlodipine 5 mg Heart healthy low-sodium diet-salty 6 given Increase physical activity as tolerated  Raynaud's disease- diagnosed 10 years ago.  Recommendation made for increasing amlodipine to see if symptoms improve. Increase amlodipine to 5 mg daily   Disposition: Follow-up with Dr. Oval Linsey in 4 to 6 months.  Jossie Ng. Waylen Depaolo NP-C    06/12/2020, 10:38 AM [address] Office [phone] Fax [phone]  Notice: This dictation was prepared with Dragon dictation along with smaller phrase technology. Any transcriptional errors that result from this process are unintentional and may not be corrected upon review.  I spent 13 minutes examining this patient, reviewing medications, and using patient centered shared decision making involving her cardiac care.  Prior to her visit I spent greater than 20 minutes reviewing her past medical history,  medications, and prior cardiac tests.

## 2020-06-11 NOTE — Patient Instructions (Addendum)
Hi [place],  It was great to get to speak with you again! As we discussed, please check your blood pressure a few times a week and keep a record of it. My assistant will reach out to you in a few weeks to get some of those readings.  I also attached some information/reminders that I hope will be helpful when you are checking your blood pressure to make sure it's not influenced by anything else.  Please reach out to me if you have any questions or need anything before our follow up!  Best, Maddie  Jeni Salles, PharmD, [place] [facility] [place] [phone]  Visit Information  Goals Addressed   None    Patient Care Plan: CCM Pharmacy Care Plan    Problem Identified: Problem: Hypertension, GERD, Hypothyroidism and Allergic Rhinitis     Long-Range Goal: Patient-Specific Goal   Start Date: 06/08/2020  Expected End Date: 06/08/2021  This Visit's Progress: On track  Priority: High  Note:   Current Barriers:  . Unable to maintain control of blood pressure  Pharmacist Clinical Goal(s):  Marland Kitchen Patient will maintain control of blood pressure as evidenced by home blood pressure readings  through collaboration with PharmD and provider.   Interventions: . 1:1 collaboration with Eulas Post, MD regarding development and update of comprehensive plan of care as evidenced by provider attestation and co-signature . Inter-disciplinary care team collaboration (see longitudinal plan of care) . Comprehensive medication review performed; medication list updated in electronic medical record  Hypertension/Raynaud's syndrome (BP goal <140/90) -Controlled -Current treatment:  Amlodipine 2.5 mg 1 tablet daily -Medications previously tried: metoprolol (for HR) -Current home readings: 164/68, 155/80 -Current dietary habits: not eating as much -Current exercise habits: not quite as active lately -Denies hypotensive/hypertensive symptoms -Educated on BP goals and  benefits of medications for prevention of heart attack, stroke and kidney damage; Exercise goal of 150 minutes per week; Importance of home blood pressure monitoring; Proper BP monitoring technique; -Counseled to monitor BP at home weekly, document, and provide log at future appointments -Counseled on diet and exercise extensively Recommended to continue current medication Plan for BP assessment in a few weeks.  GERD (Goal: minimize symptoms) -Controlled -Current treatment   Famotidine 40 mg 1/2 tablet in the morning and 1 tablet before bedtime  Gaviscon as needed -Medications previously tried: Pantoprazole (hurt stomach)  -Counseled on non-pharmacologic management of symptoms such as elevating the head of your bed, avoiding eating 2-3 hours before bed, avoiding triggering foods such as acidic, spicy, or fatty foods, eating smaller meals, and wearing clothes that are loose around the waist   Hypothyroidism (Goal: TSH 0.35-4.5) -Controlled -Current treatment  . Levothyroxine 100 mcg 1 tablet daily -Medications previously tried: none  -Recommended to continue current medication  Allergic rhinitis (Goal: minimize symptoms) -Controlled -Current treatment   Flonase 50 mcg/act 2 sprays in both nostrils as needed  Flonase sensimist as needed -Medications previously tried: none  -Recommended to continue current medication  Bone health (Goal prevent fractures) -Uncontrolled -Last DEXA Scan: 2007  T-Score femoral neck: unable to access  T-Score total hip: unable to access  T-Score lumbar spine: unable to access  T-Score forearm radius: unable to access  10-year probability of major osteoporotic fracture: unable to access  10-year probability of hip fracture: unable to access -Patient is not a candidate for pharmacologic treatment -Current treatment  . No medications -Medications previously tried: none  -Recommend [phone] units of vitamin D daily. Recommend 1200 mg of calcium  daily from dietary and supplemental sources. Recommend weight-bearing and muscle strengthening exercises for building and maintaining bone density. -Recommended repeat DEXA and vitamin D level   Health Maintenance -Vaccine gaps: Shingrix -Current therapy:  . No medications -Educated on Cost vs benefit of each product must be carefully weighed by individual consumer -Patient is satisfied with current therapy and denies issues -Recommended to continue as is  Patient Goals/Self-Care Activities . Patient will:  - take medications as prescribed check blood pressure a few times a week, document, and provide at future appointments target a minimum of 150 minutes of moderate intensity exercise weekly  Follow Up Plan: The care management team will reach out to the patient again over the next 30 days.        Patient verbalizes understanding of instructions provided today and agrees to view in [place].  The pharmacy team will reach out to the patient again over the next 30 days.   Viona Gilmore, RPH  How to Take Your Blood Pressure Blood pressure measures how strongly your blood is pressing against the walls of your arteries. Arteries are blood vessels that carry blood from your heart throughout your body. You can take your blood pressure at home with a machine. You may need to check your blood pressure at home:  To check if you have high blood pressure (hypertension).  To check your blood pressure over time.  To make sure your blood pressure medicine is working. Supplies needed:  Blood pressure machine, or monitor.  Dining room chair to sit in.  Table or desk.  Small notebook.  Pencil or pen. How to prepare Avoid these things for 30 minutes before checking your blood pressure:  Having drinks with caffeine in them, such as coffee or tea.  Drinking alcohol.  Eating.  Smoking.  Exercising. Do these things five minutes before checking your blood pressure:  Go to the  bathroom and pee (urinate).  Sit in a dining chair. Do not sit in a soft couch or an armchair.  Be quiet. Do not talk. How to take your blood pressure Follow the instructions that came with your machine. If you have a digital blood pressure monitor, these may be the instructions: 1. Sit up straight. 2. Place your feet on the floor. Do not cross your ankles or legs. 3. Rest your left arm at the level of your heart. You may rest it on a table, desk, or chair. 4. Pull up your shirt sleeve. 5. Wrap the blood pressure cuff around the upper part of your left arm. The cuff should be 1 inch (2.5 cm) above your elbow. It is best to wrap the cuff around bare skin. 6. Fit the cuff snugly around your arm. You should be able to place only one finger between the cuff and your arm. 7. Place the cord so that it rests in the bend of your elbow. 8. Press the power button. 9. Sit quietly while the cuff fills with air and loses air. 10. Write down the numbers on the screen. 11. Wait 2-3 minutes and then repeat steps 1-10.   What do the numbers mean? Two numbers make up your blood pressure. The first number is called systolic pressure. The second is called diastolic pressure. An example of a blood pressure reading is "120 over 80" (or 120/80). If you are an adult and do not have a medical condition, use this guide to find out if your blood pressure is normal: Normal  First number: below  120.  Second number: below 80. Elevated  First number: 120-129.  Second number: below 80. Hypertension stage 1  First number: 130-139.  Second number: 80-89. Hypertension stage 2  First number: 140 or above.  Second number: 43 or above. Your blood pressure is above normal even if only the top or bottom number is above normal. Follow these instructions at home:  Check your blood pressure as often as your doctor tells you to.  Check your blood pressure at the same time every day.  Take your monitor to your  next doctor's appointment. Your doctor will: ? Make sure you are using it correctly. ? Make sure it is working right.  Make sure you understand what your blood pressure numbers should be.  Tell your doctor if your medicine is causing side effects.  Keep all follow-up visits as told by your doctor. This is important. General tips:  You will need a blood pressure machine, or monitor. Your doctor can suggest a monitor. You can buy one at a drugstore or online. When choosing one: ? Choose one with an arm cuff. ? Choose one that wraps around your upper arm. Only one finger should fit between your arm and the cuff. ? Do not choose one that measures your blood pressure from your wrist or finger. Where to find more information American Heart Association: www.heart.org Contact a doctor if:  Your blood pressure keeps being high. Get help right away if:  Your first blood pressure number is higher than 180.  Your second blood pressure number is higher than 120. Summary  Check your blood pressure at the same time every day.  Avoid caffeine, alcohol, smoking, and exercise for 30 minutes before checking your blood pressure.  Make sure you understand what your blood pressure numbers should be. This information is not intended to replace advice given to you by your health care provider. Make sure you discuss any questions you have with your health care provider. Document Revised: 12/17/2018 Document Reviewed: 12/17/2018 Elsevier Patient Education  2021 [employer].

## 2020-06-12 ENCOUNTER — Encounter: Payer: Self-pay | Admitting: General Practice

## 2020-06-12 ENCOUNTER — Ambulatory Visit (INDEPENDENT_AMBULATORY_CARE_PROVIDER_SITE_OTHER): Payer: Medicare Other | Admitting: General Practice

## 2020-06-12 ENCOUNTER — Other Ambulatory Visit: Payer: Self-pay

## 2020-06-12 VITALS — BP 130/80 | HR 75 | Ht 64.0 in | Wt 156.4 lb

## 2020-06-12 DIAGNOSIS — I10 Essential (primary) hypertension: Secondary | ICD-10-CM | POA: Diagnosis not present

## 2020-06-12 DIAGNOSIS — I517 Cardiomegaly: Secondary | ICD-10-CM | POA: Diagnosis not present

## 2020-06-12 DIAGNOSIS — I7300 Raynaud's syndrome without gangrene: Secondary | ICD-10-CM | POA: Diagnosis not present

## 2020-06-12 NOTE — Patient Instructions (Signed)
Medication Instructions:  The current medical regimen is effective;  continue present plan and medications as directed. Please refer to the Current Medication list given to you today.  *If you need a refill on your cardiac medications before your next appointment, please call your pharmacy*  Lab Work:   Testing/Procedures:  NONE    NONE  Special Instructions PLEASE READ AND FOLLOW SALTY 6-ATTACHED-1,800mg  daily  PLEASE MAINTAIN PHYSICAL ACTIVITY  Follow-Up: Your next appointment:  4-6 month(s) In Person with Skeet Latch, MD  At [facility], you and your health needs are our priority.  As part of our continuing mission to provide you with exceptional heart care, we have created designated Provider Care Teams.  These Care Teams include your primary Cardiologist (physician) and Advanced Practice Providers (APPs -  Physician Assistants and Nurse Practitioners) who all work together to provide you with the care you need, when you need it.            6 SALTY THINGS TO AVOID     1,800MG  DAILY

## 2020-06-25 ENCOUNTER — Other Ambulatory Visit: Payer: Self-pay

## 2020-06-25 ENCOUNTER — Ambulatory Visit (INDEPENDENT_AMBULATORY_CARE_PROVIDER_SITE_OTHER)
Admission: RE | Admit: 2020-06-25 | Discharge: 2020-06-25 | Disposition: A | Discharge status: Home / Self Care | Payer: Medicare Other | Source: Ambulatory Visit | Attending: Family Medicine | Admitting: Family Medicine

## 2020-06-25 DIAGNOSIS — Z780 Asymptomatic menopausal state: Secondary | ICD-10-CM | POA: Diagnosis not present

## 2020-07-03 ENCOUNTER — Telehealth: Payer: Self-pay | Admitting: Pharmacist

## 2020-07-03 NOTE — Chronic Care Management (AMB) (Signed)
    Chronic Care Management Pharmacy Assistant   Name: Christy Moore  MRN: [id number] DOB: 01/20/1945  Spoke with patient per Jeni Salles to follow up with patient since her appointment on 06/08/20. Patient was having elevated blood pressures and was asked to keep a log. Patient reports readings as below. Patient stated she suffers from Vertigo but otherwise has not had any symptoms out of the ordinary for her. Patient states her Cardiologist doubled her Amlodipine and she is currently taking 5mg  daily now instead of 2.5mg . patient reports no other changes or issues with her medications since last CPP visit. Patient informed to continue keeping log of her blood pressures. Patient agreeable and thanked me for my call.   06/19/20 - 135/67 P:85 06/23/20 - 162/79 P:88 06/27/20 - 133/66 P:83 06/30/20 - 138/85 P:83 07/03/20 - 124/71 P:104    Medications: Outpatient Encounter Medications as of 07/03/2020  Medication Sig   amLODipine (NORVASC) 2.5 MG tablet Take 1 tablet (2.5 mg total) by mouth daily.   famotidine (PEPCID) 40 MG tablet Take 60 mg by mouth daily.   fluticasone (FLONASE) 50 MCG/ACT nasal spray Place 2 sprays into both nostrils daily as needed for allergies.    levothyroxine (SYNTHROID) 100 MCG tablet TAKE 1 TABLET BY MOUTH DAILY.   [DISCONTINUED] metoprolol succinate (TOPROL-XL) 25 MG 24 hr tablet 1/2 tablet twice a day   [DISCONTINUED] mupirocin (BACTROBAN) 2 % nasal ointment by Nasal route 2 (two) times daily. Use one-half of tube in each nostril twice daily for five (5) days. After application, press sides of nose together and gently massage.    [DISCONTINUED] topiramate (TOPAMAX) 25 MG tablet Take 25 mg by mouth at bedtime.     No facility-administered encounter medications on file as of 07/03/2020.    Star Rating Drugs:  None.   [place]  Clinical Pharmacist Assistant [phone]

## 2020-07-04 ENCOUNTER — Encounter: Payer: Self-pay | Admitting: Family Medicine

## 2020-07-04 ENCOUNTER — Other Ambulatory Visit: Payer: Self-pay

## 2020-07-04 ENCOUNTER — Ambulatory Visit (INDEPENDENT_AMBULATORY_CARE_PROVIDER_SITE_OTHER): Payer: Medicare Other | Admitting: Family Medicine

## 2020-07-04 VITALS — BP 134/62 | HR 95 | Temp 98.6°F | Wt 160.5 lb

## 2020-07-04 DIAGNOSIS — H8111 Benign paroxysmal vertigo, right ear: Secondary | ICD-10-CM

## 2020-07-04 NOTE — Progress Notes (Signed)
Established Patient Office Visit  Subjective:  Patient ID: Christy Moore, female    DOB: [birth year]  Age: 75 y.o. MRN: [id number]  CC:  Chief Complaint  Patient presents with   Dizziness    Vertigo when getting up from laying down or going from sitting to laying, happens ever night and every morning, L ear feels stopped up, sharp pains on the left side of the back of the head and behind the ear,     HPI Christy Moore presents for dizziness intermittently past week and a half or so.  She describes this as vertigo symptoms worse in the morning and when she first gets up.  She has noted symptoms seem to be worse with turning to the right.  She has had similar symptoms in the past.  No recent headache.  No speech changes.  No swallowing difficulties.  No focal weakness.  She has had some intermittent symptoms of feeling like her ear is stopped up on the left side.  No acute hearing changes.  No unilateral tinnitus.  Occasional sharp shooting pains behind the left ear.  No recent fevers or chills.  She had recent DEXA scan and we reviewed results.  This showed osteopenia.  Very little change in T score compared to DEXA all the way back to 2002.  She stays very active.  She does take some calcium and vitamin D.  Past Medical History:  Diagnosis Date   Cardiomegaly    GERD (gastroesophageal reflux disease)    Hypertension    Hypothyroidism    Sinus tachycardia     Past Surgical History:  Procedure Laterality Date   HAND TENDON SURGERY  02/09/2007   tendon transfer-Christy Moore   NASAL SEPTUM SURGERY     operated on by Dr. Ernesto Moore 20 years ago   OVARIAN CYST REMOVAL     Right Sided Salpingo-oopherectomy   SALPINGOOPHORECTOMY      Family History  Problem Relation Age of Onset   Allergies Other        Grandmother-pt states she takes after grandmother who had multiple allergies   Cancer Mother        breast and lung   Stroke Mother 81   Hypothyroidism Mother    Cancer Father         prostate   Heart attack Father 29   Alcohol abuse Maternal Grandmother    Alcohol abuse Maternal Grandfather     Social History   Socioeconomic History   Marital status: Married    Spouse name: Not on file   Number of children: Not on file   Years of education: Not on file   Highest education level: Not on file  Occupational History   Not on file  Tobacco Use   Smoking status: Never   Smokeless tobacco: Never  Vaping Use   Vaping Use: Never used  Substance and Sexual Activity   Alcohol use: No   Drug use: No   Sexual activity: Not on file  Other Topics Concern   Not on file  Social History Narrative   Not on file   Social Determinants of Health   Financial Resource Strain: Low Risk    Difficulty of Paying Living Expenses: Not hard at all  Food Insecurity: No Food Insecurity   Worried About Charity fundraiser in the Last Year: Never true   [place] in the Last Year: Never true  Transportation Needs: No Transportation Needs   Lack  of Transportation (Medical): No   Lack of Transportation (Non-Medical): No  Physical Activity: Inactive   Days of Exercise per Week: 0 days   Minutes of Exercise per Session: 0 min  Stress: No Stress Concern Present   Feeling of Stress : Not at all  Social Connections: Moderately Isolated   Frequency of Communication with Friends and Family: Once a week   Frequency of Social Gatherings with Friends and Family: Once a week   Attends Religious Services: More than 4 times per year   Active Member of [employer] or Organizations: No   Attends Archivist Meetings: Never   Marital Status: Married  Human resources officer Violence: Not At Risk   Fear of Current or Ex-Partner: No   Emotionally Abused: No   Physically Abused: No   Sexually Abused: No    Outpatient Medications Prior to Visit  Medication Sig Dispense Refill   amLODipine (NORVASC) 2.5 MG tablet Take 1 tablet (2.5 mg total) by mouth daily. 90 tablet 3   famotidine  (PEPCID) 40 MG tablet Take 60 mg by mouth daily.     fluticasone (FLONASE) 50 MCG/ACT nasal spray Place 2 sprays into both nostrils daily as needed for allergies.      levothyroxine (SYNTHROID) 100 MCG tablet TAKE 1 TABLET BY MOUTH DAILY. 90 tablet 3   No facility-administered medications prior to visit.    Allergies  Allergen Reactions   Amoxicillin-Pot Clavulanate     REACTION: nausea   Azithromycin     REACTION: nausea, Gi   Celecoxib     REACTION: nausea   Ciprofloxacin     REACTION: nausea   Doxycycline     REACTION: gi upset   Erythromycin     REACTION: nause, GI   Hydrocodone     REACTION: nausea   Hydrocodone-Acetaminophen     REACTION: GI upset   Levofloxacin     REACTION: she claims renal failiure   Penicillins    Propoxyphene N-Acetaminophen    Rofecoxib     REACTION: nausea   Sulfa Antibiotics    Tetanus Toxoid     ROS Review of Systems  Eyes:  Negative for visual disturbance.  Cardiovascular:  Negative for chest pain.  Genitourinary:  Negative for dysuria.  Neurological:  Positive for dizziness. Negative for seizures, syncope, facial asymmetry, speech difficulty, weakness and headaches.  Psychiatric/Behavioral:  Negative for confusion.      Objective:    Physical Exam Vitals reviewed.  Constitutional:      Appearance: Normal appearance.  Cardiovascular:     Rate and Rhythm: Normal rate and regular rhythm.  Neurological:     General: No focal deficit present.     Mental Status: She is alert and oriented to person, place, and time. Mental status is at baseline.     Cranial Nerves: No cranial nerve deficit.     Motor: No weakness.     Coordination: Coordination normal.     Comments: Vertigo symptoms are reproduced with patient going from seated to supine with head turned 45 degrees to the right.    BP 134/62 (BP Location: Left Arm, Patient Position: Sitting, Cuff Size: Normal)   Pulse 95   Temp 98.6 F (37 C) (Oral)   Wt 160 lb 8 oz (72.8 kg)    SpO2 97%   BMI 27.55 kg/m  Wt Readings from Last 3 Encounters:  07/04/20 160 lb 8 oz (72.8 kg)  06/12/20 156 lb 6.4 oz (70.9 kg)  04/10/20 159 lb (72.1  kg)     Health Maintenance Due  Topic Date Due   MAMMOGRAM  Never done   Zoster Vaccines- Shingrix (1 of 2) Never done   COLONOSCOPY (Pts 45-51yrs Insurance coverage will need to be confirmed)  02/22/2019    There are no preventive care reminders to display for this patient.  Lab Results  Component Value Date   TSH 2.08 03/12/2020   Lab Results  Component Value Date   WBC 10.3 12/11/2018   HGB 14.9 12/11/2018   HCT 45.9 12/11/2018   MCV 93.9 12/11/2018   PLT 200 12/11/2018   Lab Results  Component Value Date   NA 142 03/12/2020   K 4.2 03/12/2020   CO2 31 03/12/2020   GLUCOSE 90 03/12/2020   BUN 15 03/12/2020   CREATININE 0.86 03/12/2020   BILITOT 0.8 02/14/2015   ALKPHOS 43 02/14/2015   AST 23 02/14/2015   ALT 12 02/14/2015   PROT 6.8 02/14/2015   ALBUMIN 4.1 02/14/2015   CALCIUM 9.1 03/12/2020   ANIONGAP 12 12/11/2018   GFR 67.19 03/12/2020   Lab Results  Component Value Date   CHOL 163 02/14/2015   Lab Results  Component Value Date   HDL 52.70 02/14/2015   Lab Results  Component Value Date   LDLCALC 89 02/14/2015   Lab Results  Component Value Date   TRIG 106.0 02/14/2015   Lab Results  Component Value Date   CHOLHDL 3 02/14/2015   No results found for: HGBA1C    Assessment & Plan:   Vertigo of over 1 week duration.  Symptoms reproducible as above.  Suspect benign peripheral positional vertigo to the right.  She does not any red flags as far as any focal neurologic concerns  -Recommend trial of Epley maneuvers with handout given and be in touch if dizziness not resolving over the next week  No orders of the defined types were placed in this encounter.   Follow-up: No follow-ups on file.    [name], MD

## 2020-07-13 ENCOUNTER — Encounter: Payer: Self-pay | Admitting: Gastroenterology

## 2020-07-13 ENCOUNTER — Telehealth: Payer: Self-pay

## 2020-07-13 ENCOUNTER — Ambulatory Visit (INDEPENDENT_AMBULATORY_CARE_PROVIDER_SITE_OTHER): Payer: Medicare Other | Admitting: Gastroenterology

## 2020-07-13 DIAGNOSIS — F32A Depression, unspecified: Secondary | ICD-10-CM | POA: Insufficient documentation

## 2020-07-13 DIAGNOSIS — R1310 Dysphagia, unspecified: Secondary | ICD-10-CM | POA: Diagnosis not present

## 2020-07-13 DIAGNOSIS — Z1211 Encounter for screening for malignant neoplasm of colon: Secondary | ICD-10-CM

## 2020-07-13 DIAGNOSIS — K219 Gastro-esophageal reflux disease without esophagitis: Secondary | ICD-10-CM | POA: Diagnosis not present

## 2020-07-13 MED ORDER — ESOMEPRAZOLE MAGNESIUM 40 MG PO CPDR: 40.0000 mg | DELAYED_RELEASE_CAPSULE | Freq: Every day | ORAL | 11 refills | Status: DC

## 2020-07-13 MED ORDER — FAMOTIDINE 20 MG PO TABS: 20.0000 mg | ORAL_TABLET | Freq: Every day | ORAL | Status: DC

## 2020-07-13 MED ORDER — SUPREP BOWEL PREP KIT 17.5-3.13-1.6 GM/177ML PO SOLN: 1.0000 | ORAL | 0 refills | Status: DC

## 2020-07-13 NOTE — Telephone Encounter (Signed)
Ok, lets try pantoprazole 40mg  pills one pill once daily, disp 30 with 11 refills.   Thanks

## 2020-07-13 NOTE — Telephone Encounter (Signed)
Received from pharmacy stating that insurance will not cover esomeprazole 40mg .  Insurance will only cover pantoprazole, omeprazole, or lansoprazole.   Please advise.

## 2020-07-13 NOTE — Progress Notes (Signed)
HPI: This is a very pleasant 75 year old woman who was referred to me by Eulas Post, MD  to evaluate dysphagia, GERD, routine risk for colon cancer.    EGD 2004 Dr. 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  Transfer care to Dr. Earlie Raveling.  She had repeat upper endoscopy with him sometime around 2012, her esophagus was dilated then.  She did get relief out of both of these dilations about 4 5 years.  She has Sjogren's syndrome and has a very dry mouth chronically.  She was seen by gastroenterologist March 2021.  This is for epigastric discomfort, esophageal stricture.  She was offered wrist endoscopy with esophageal dilation but she really wanted to avoid procedures.  She was also recommended to have colon cancer screening with a colonoscopy and she declined that as well.  Instead she started famotidine 40 mg nightly if needed she was supposed to follow-up in 1 month.  It does not look like she followed up with them ever.  Currently she takes famotidine 60 mg before dinner every night.  She has dysphagia at times with thick starchy potatoes, her weight is up 3 pounds in the past 4 weeks.  She has chronic GERD, pyrosis.  This seems to be fairly well controlled on her Pepcid but not completely controlled.  She has no troubles with her bowels, since pacifically no constipation diarrhea or bleeding.   Review of systems: Pertinent positive and negative review of systems were noted in the above HPI section. All other review negative.   Past Medical History:  Diagnosis Date   Arthritis    Cardiomegaly    Depression    GERD (gastroesophageal reflux disease)    Hypertension    Hypothyroidism    Sinus tachycardia    Sjogren's syndrome ([place])    Status post dilation of esophageal narrowing 2012   Dr. Earlean Shawl    Past Surgical History:  Procedure Laterality Date   HAND TENDON SURGERY  02/09/2007   tendon  transfer-Dr. Amedeo Plenty   NASAL SEPTUM SURGERY     operated on by Dr. Ernesto Rutherford 20 years ago   OVARIAN CYST REMOVAL     Right Sided Salpingo-oopherectomy   SALPINGOOPHORECTOMY     WISDOM TOOTH EXTRACTION      Current Outpatient Medications  Medication Sig Dispense Refill   amLODipine (NORVASC) 2.5 MG tablet Take 1 tablet (2.5 mg total) by mouth daily. (Patient taking differently: Take 5 mg by mouth daily.) 90 tablet 3   fluticasone (FLONASE) 50 MCG/ACT nasal spray Place 2 sprays into both nostrils daily as needed for allergies.      levothyroxine (SYNTHROID) 100 MCG tablet TAKE 1 TABLET BY MOUTH DAILY. 90 tablet 3   famotidine (PEPCID) 40 MG tablet Take 60 mg by mouth daily.     No current facility-administered medications for this visit.    Allergies as of 07/13/2020 - Review Complete 07/13/2020  Allergen Reaction Noted   Amoxicillin-pot clavulanate  12/20/2007   Azithromycin  12/20/2007   Celecoxib  12/20/2007   Ciprofloxacin  12/20/2007   Doxycycline  12/20/2007   Erythromycin  12/20/2007   Hydrocodone  12/20/2007   Hydrocodone-acetaminophen  12/20/2007   Levofloxacin  12/20/2007   Penicillins  10/03/2018   Propoxyphene n-acetaminophen  12/20/2007   Rofecoxib  12/20/2007   Sulfa antibiotics  10/03/2018   Tetanus toxoid  12/20/2007    Family History  Problem Relation Age of Onset  Cancer Mother        breast and lung   Stroke Mother 53   Hypothyroidism Mother    Cancer Father        prostate   Heart attack Father 35   Alcohol abuse Maternal Grandmother    Alcohol abuse Maternal Grandfather    Allergies Other        Grandmother-pt states she takes after grandmother who had multiple allergies   Colon cancer Neg Hx     Social History   Socioeconomic History   Marital status: Married    Spouse name: Not on file   Number of children: Not on file   Years of education: Not on file   Highest education level: Not on file  Occupational History   Not on file  Tobacco  Use   Smoking status: Never   Smokeless tobacco: Never  Vaping Use   Vaping Use: Never used  Substance and Sexual Activity   Alcohol use: Yes    Comment: occasional   Drug use: No   Sexual activity: Not on file  Other Topics Concern   Not on file  Social History Narrative   Not on file   Social Determinants of Health   Financial Resource Strain: Low Risk    Difficulty of Paying Living Expenses: Not hard at all  Food Insecurity: No Food Insecurity   Worried About Charity fundraiser in the Last Year: Never true   [place] in the Last Year: Never true  Transportation Needs: No Transportation Needs   Lack of Transportation (Medical): No   Lack of Transportation (Non-Medical): No  Physical Activity: Inactive   Days of Exercise per Week: 0 days   Minutes of Exercise per Session: 0 min  Stress: No Stress Concern Present   Feeling of Stress : Not at all  Social Connections: Moderately Isolated   Frequency of Communication with Friends and Family: Once a week   Frequency of Social Gatherings with Friends and Family: Once a week   Attends Religious Services: More than 4 times per year   Active Member of [employer] or Organizations: No   Attends Music therapist: Never   Marital Status: Married  Human resources officer Violence: Not At Risk   Fear of Current or Ex-Partner: No   Emotionally Abused: No   Physically Abused: No   Sexually Abused: No     Physical Exam: Pulse 92   Ht 5' 4.5" (1.638 m)   Wt 160 lb (72.6 kg)   SpO2 98%   BMI 27.04 kg/m  Constitutional: generally well-appearing Psychiatric: alert and oriented x3 Eyes: extraocular movements intact Mouth: oral pharynx moist, no lesions Neck: supple no lymphadenopathy Cardiovascular: heart regular rate and rhythm Lungs: clear to auscultation bilaterally Abdomen: soft, nontender, nondistended, no obvious ascites, no peritoneal signs, normal bowel sounds Extremities: no lower extremity edema  bilaterally Skin: no lesions on visible extremities   Assessment and plan: 75 y.o. female with chronic GERD, intermittent dysphagia, Sjogren's syndrome, routine risk for colon cancer  Her GERD symptoms are fairly well controlled on 60 mg of H2 blocker daily but not completely controlled.  I recommended that she start taking an PPI every morning shortly before breakfast, [place] omeprazole, 40 mg.  I also recommend that she continue taking her H2 blocker Pepcid at 20 mg strength at bedtime every night.  Given her intermittent dysphagia which I think is possibly related to her GERD and almost certainly related to her Sjogren's  syndrome and dry mouth I recommended a EGD to exclude significant stricturing or esophagitis.  At the same time she agreed to undergo colonoscopy for colon cancer screening routine risk.  I see no reason for further blood tests or imaging studies prior to then.   Please see the "Patient Instructions" section for addition details about the plan.   Christy Loffler, MD [facility] 07/13/2020, 10:24 AM  Cc: Eulas Post, MD  Total time on date of encounter was 45 minutes (this included time spent preparing to see the patient reviewing records; obtaining and/or reviewing separately obtained history; performing a medically appropriate exam and/or evaluation; counseling and educating the patient and family if present; ordering medications, tests or procedures if applicable; and documenting clinical information in the health record).

## 2020-07-13 NOTE — Patient Instructions (Signed)
If you are age 73 or older, your body mass index should be between 23-30. Your Body mass index is 27.04 kg/m. If this is out of the aforementioned range listed, please consider follow up with your Primary Care Provider. __________________________________________________________  The [place] [facility] providers would like to encourage you to use [facility] to communicate with providers for non-urgent requests or questions.  Due to long hold times on the telephone, sending your provider a message by [facility] may be a faster and more efficient way to get a response.  Please allow 48 business hours for a response.  Please remember that this is for non-urgent requests.  __________________________________________________________  Dennis Bast have been scheduled for an endoscopy and colonoscopy. Please follow the written instructions given to you at your visit today. Please pick up your prep supplies at the pharmacy within the next 1-3 days. If you use inhalers (even only as needed), please bring them with you on the day of your procedure. ___________________________________________________________  We have sent the following medications to your pharmacy for you to pick up at your convenience:   START: Nexium 40mg  one capsule shortly before breakfast meal each day. ____________________________________________________________  Pepcid 20mg  take one tablet every night at bedtime. ____________________________________________________________  Due to recent changes in healthcare laws, you may see the results of your imaging and laboratory studies on MyChart before your provider has had a chance to review them.  We understand that in some cases there may be results that are confusing or concerning to you. Not all laboratory results come back in the same time frame and the provider may be waiting for multiple results in order to interpret others.  Please give [place] 48 hours in order for your provider to thoroughly review all the  results before contacting the office for clarification of your results.  _____________________________________________________________  Thank you for entrusting me with your care and choosing [facility].  Dr Ardis Hughs

## 2020-07-16 NOTE — Telephone Encounter (Signed)
Left message on voicemail for patient to return call to discuss medications. Will continue efforts.

## 2020-07-16 NOTE — Telephone Encounter (Signed)
Patient returned call and stated that when she went to pharmacy, they suggested that she should try omeprazole over-the-counter since her insurance would not cover esomeprazole.  She purchased omeprazole 20mg  and will take 2 capsules.  Is this OK?

## 2020-07-17 MED ORDER — OMEPRAZOLE MAGNESIUM 20 MG PO TBEC: 40.0000 mg | DELAYED_RELEASE_TABLET | Freq: Every day | ORAL | Status: DC

## 2020-07-17 NOTE — Telephone Encounter (Signed)
Left message on voicemail per patients request that it is OK to take OTC omeprazole 20mg  2 capsules daily per Dr Ardis Hughs.  Advised patient to return call to office with any questions or concerns.

## 2020-07-17 NOTE — Telephone Encounter (Signed)
Yes, thanks

## 2020-07-17 NOTE — Addendum Note (Signed)
Addended by: Stevan Born on: 07/17/2020 03:47 PM   Modules accepted: Orders

## 2020-07-25 ENCOUNTER — Telehealth: Payer: Self-pay | Admitting: Gastroenterology

## 2020-07-25 NOTE — Telephone Encounter (Signed)
The pt states that nexium is making her sick with nausea, weakness and lightheaded.  She is stopping the nexium and starting omeprazole 40 mg in the morning and pepcid at bedtime.  She was advised to keep her appt for her procedures as planned.  FYI Dr Ardis Hughs.

## 2020-07-25 NOTE — Telephone Encounter (Signed)
Inbound call from patient husband. States Nexium has been making patient sick. Vomiting, loss of appetite, tiredness, and lightheaded. Best contact number [phone]

## 2020-08-01 ENCOUNTER — Ambulatory Visit (INDEPENDENT_AMBULATORY_CARE_PROVIDER_SITE_OTHER): Payer: Medicare Other | Admitting: Family Medicine

## 2020-08-01 ENCOUNTER — Other Ambulatory Visit: Payer: Self-pay

## 2020-08-01 VITALS — BP 136/70 | HR 101 | Temp 98.3°F | Ht 64.5 in | Wt 157.1 lb

## 2020-08-01 DIAGNOSIS — R1011 Right upper quadrant pain: Secondary | ICD-10-CM

## 2020-08-01 DIAGNOSIS — R630 Anorexia: Secondary | ICD-10-CM | POA: Diagnosis not present

## 2020-08-01 NOTE — Progress Notes (Signed)
Established Patient Office Visit  Subjective:  Patient ID: Christy Moore, female    DOB: 1945-06-20  Age: 75 y.o. MRN: [id number]  CC:  Chief Complaint  Patient presents with   Abdominal Pain   Diarrhea    HPI Christy Moore presents for approxi-1 week history of nonbloody diarrhea, generalized weakness, loss of appetite, vomiting, and some right upper quadrant pain.  No hematemesis.  No melena.  No bloody stools.  No fever.  Home COVID test negative.  She states this is the first time she has vomited about 20 years.  She has also has had some intermittent radiation of pain towards the right scapular region.  After doing some reading she was concerned whether she could have gallbladder disease.  She does have some chronic GI issues and takes Pepcid and omeprazole for reflux.  Her recent reflux symptoms have been controlled.  Symptoms above started around July 20.  She was going to lunch with some friends and by the time they reached the restaurant around 11 AM she felt very weak and shaky and loss of appetite.  She went to the restroom and had what she described as "violent dry retching ".  Went home and had several more episodes of vomiting.  Vomiting has resolved at this time.  Diarrhea slowly improving.  No history of pancreatitis.  She was able to eat a [employer] hamburger yesterday and kept that down.  Generally trying to eat more bland foods otherwise  Past Medical History:  Diagnosis Date   Arthritis    Cardiomegaly    Depression    GERD (gastroesophageal reflux disease)    Hypertension    Hypothyroidism    Sinus tachycardia    Sjogren's syndrome (HCC)    Status post dilation of esophageal narrowing 2012   Dr. Earlean Shawl    Past Surgical History:  Procedure Laterality Date   HAND TENDON SURGERY  02/09/2007   tendon transfer-Dr. Amedeo Plenty   NASAL SEPTUM SURGERY     operated on by Dr. Ernesto Rutherford 20 years ago   OVARIAN CYST REMOVAL     Right Sided Salpingo-oopherectomy    SALPINGOOPHORECTOMY     WISDOM TOOTH EXTRACTION      Family History  Problem Relation Age of Onset   Cancer Mother        breast and lung   Stroke Mother 68   Hypothyroidism Mother    Cancer Father        prostate   Heart attack Father 61   Alcohol abuse Maternal Grandmother    Alcohol abuse Maternal Grandfather    Allergies Other        Grandmother-pt states she takes after grandmother who had multiple allergies   Colon cancer Neg Hx     Social History   Socioeconomic History   Marital status: Married    Spouse name: Not on file   Number of children: Not on file   Years of education: Not on file   Highest education level: Not on file  Occupational History   Not on file  Tobacco Use   Smoking status: Never   Smokeless tobacco: Never  Vaping Use   Vaping Use: Never used  Substance and Sexual Activity   Alcohol use: Yes    Comment: occasional   Drug use: No   Sexual activity: Not on file  Other Topics Concern   Not on file  Social History Narrative   Not on file   Social Determinants of Health  Financial Resource Strain: Low Risk    Difficulty of Paying Living Expenses: Not hard at all  Food Insecurity: No Food Insecurity   Worried About Charity fundraiser in the Last Year: Never true   Ran Out of Food in the Last Year: Never true  Transportation Needs: No Transportation Needs   Lack of Transportation (Medical): No   Lack of Transportation (Non-Medical): No  Physical Activity: Inactive   Days of Exercise per Week: 0 days   Minutes of Exercise per Session: 0 min  Stress: No Stress Concern Present   Feeling of Stress : Not at all  Social Connections: Moderately Isolated   Frequency of Communication with Friends and Family: Once a week   Frequency of Social Gatherings with Friends and Family: Once a week   Attends Religious Services: More than 4 times per year   Active Member of [employer] or Organizations: No   Attends Archivist Meetings: Never    Marital Status: Married  Human resources officer Violence: Not At Risk   Fear of Current or Ex-Partner: No   Emotionally Abused: No   Physically Abused: No   Sexually Abused: No    Outpatient Medications Prior to Visit  Medication Sig Dispense Refill   amLODipine (NORVASC) 2.5 MG tablet Take 1 tablet (2.5 mg total) by mouth daily. (Patient taking differently: Take 5 mg by mouth daily.) 90 tablet 3   famotidine (PEPCID) 20 MG tablet Take 1 tablet (20 mg total) by mouth at bedtime.     fluticasone (FLONASE) 50 MCG/ACT nasal spray Place 2 sprays into both nostrils daily as needed for allergies.      levothyroxine (SYNTHROID) 100 MCG tablet TAKE 1 TABLET BY MOUTH DAILY. 90 tablet 3   Na Sulfate-K Sulfate-Mg Sulf (SUPREP BOWEL PREP KIT) 17.5-3.13-1.6 GM/177ML SOLN Take 1 kit by mouth as directed. 324 mL 0   omeprazole (PRILOSEC OTC) 20 MG tablet Take 2 tablets (40 mg total) by mouth daily.     No facility-administered medications prior to visit.    Allergies  Allergen Reactions   Amoxicillin-Pot Clavulanate     REACTION: nausea   Azithromycin     REACTION: nausea, Gi   Celecoxib     REACTION: nausea   Ciprofloxacin     REACTION: nausea   Doxycycline     REACTION: gi upset   Erythromycin     REACTION: nause, GI   Hydrocodone     REACTION: nausea   Hydrocodone-Acetaminophen     REACTION: GI upset   Levofloxacin     REACTION: she claims renal failiure   Penicillins    Propoxyphene N-Acetaminophen    Rofecoxib     REACTION: nausea   Sulfa Antibiotics    Tetanus Toxoid     ROS Review of Systems  Constitutional:  Positive for fatigue. Negative for chills and fever.  Respiratory:  Negative for shortness of breath.   Cardiovascular:  Negative for chest pain.  Gastrointestinal:  Positive for abdominal pain, diarrhea and vomiting.  Genitourinary:  Negative for dysuria.  Neurological:  Negative for syncope.     Objective:    Physical Exam Vitals reviewed.  Constitutional:       Appearance: She is well-developed.  Cardiovascular:     Rate and Rhythm: Normal rate and regular rhythm.  Pulmonary:     Effort: Pulmonary effort is normal.     Breath sounds: Normal breath sounds.  Abdominal:     General: Bowel sounds are normal.  Palpations: Abdomen is soft. There is no hepatomegaly or mass.     Comments: No guarding or rebound tenderness.  She does have some right upper quadrant tenderness to deep palpation.  No hepatomegaly noted.  Genitourinary:    Vagina: No tenderness or bleeding.  Neurological:     Mental Status: She is alert.    BP 136/70   Pulse (!) 101   Temp 98.3 F (36.8 C) (Oral)   Ht 5' 4.5" (1.638 m)   Wt 157 lb 1.6 oz (71.3 kg)   SpO2 97%   BMI 26.55 kg/m  Wt Readings from Last 3 Encounters:  08/01/20 157 lb 1.6 oz (71.3 kg)  07/13/20 160 lb (72.6 kg)  07/04/20 160 lb 8 oz (72.8 kg)     Health Maintenance Due  Topic Date Due   MAMMOGRAM  Never done   Zoster Vaccines- Shingrix (1 of 2) Never done   COLONOSCOPY (Pts 45-19yr Insurance coverage will need to be confirmed)  02/22/2019    There are no preventive care reminders to display for this patient.  Lab Results  Component Value Date   TSH 2.08 03/12/2020   Lab Results  Component Value Date   WBC 10.3 12/11/2018   HGB 14.9 12/11/2018   HCT 45.9 12/11/2018   MCV 93.9 12/11/2018   PLT 200 12/11/2018   Lab Results  Component Value Date   NA 142 03/12/2020   K 4.2 03/12/2020   CO2 31 03/12/2020   GLUCOSE 90 03/12/2020   BUN 15 03/12/2020   CREATININE 0.86 03/12/2020   BILITOT 0.8 02/14/2015   ALKPHOS 43 02/14/2015   AST 23 02/14/2015   ALT 12 02/14/2015   PROT 6.8 02/14/2015   ALBUMIN 4.1 02/14/2015   CALCIUM 9.1 03/12/2020   ANIONGAP 12 12/11/2018   GFR 67.19 03/12/2020   Lab Results  Component Value Date   CHOL 163 02/14/2015   Lab Results  Component Value Date   HDL 52.70 02/14/2015   Lab Results  Component Value Date   LDLCALC 89 02/14/2015   Lab  Results  Component Value Date   TRIG 106.0 02/14/2015   Lab Results  Component Value Date   CHOLHDL 3 02/14/2015   No results found for: HGBA1C    Assessment & Plan:   Recent vomiting and diarrhea. ?  Acute viral illness.  Though symptoms have improved somewhat but she has some right upper quadrant pain and specifically has concerns about gallstone disease.  -Check CBC and CMP along with lipase -Consider repeat ultrasound abdomen to further assess -Recommend bland diet until further assessed   No orders of the defined types were placed in this encounter.   Follow-up: No follow-ups on file.    BCarolann Littler MD

## 2020-08-01 NOTE — Patient Instructions (Signed)
Will set up abdominal ultrasound to further assess  Try to eat bland diet in the meantime.

## 2020-08-01 NOTE — Progress Notes (Signed)
Pre visit review using our clinic review tool, if applicable. No additional management support is needed unless otherwise documented below in the visit note. 

## 2020-08-02 ENCOUNTER — Other Ambulatory Visit (INDEPENDENT_AMBULATORY_CARE_PROVIDER_SITE_OTHER): Payer: Medicare Other

## 2020-08-02 DIAGNOSIS — R1011 Right upper quadrant pain: Secondary | ICD-10-CM | POA: Diagnosis not present

## 2020-08-02 LAB — CBC WITH DIFFERENTIAL/PLATELET
Basophils Absolute: 0 10*3/uL (ref 0.0–0.1)
Basophils Relative: 0.2 % (ref 0.0–3.0)
Eosinophils Absolute: 0.2 10*3/uL (ref 0.0–0.7)
Eosinophils Relative: 2.7 % (ref 0.0–5.0)
HCT: 42.4 % (ref 36.0–46.0)
Hemoglobin: 14.2 g/dL (ref 12.0–15.0)
Lymphocytes Relative: 14.6 % (ref 12.0–46.0)
Lymphs Abs: 1.2 10*3/uL (ref 0.7–4.0)
MCHC: 33.6 g/dL (ref 30.0–36.0)
MCV: 91.4 fl (ref 78.0–100.0)
Monocytes Absolute: 0.6 10*3/uL (ref 0.1–1.0)
Monocytes Relative: 7.1 % (ref 3.0–12.0)
Neutro Abs: 6.2 10*3/uL (ref 1.4–7.7)
Neutrophils Relative %: 75.4 % (ref 43.0–77.0)
Platelets: 218 10*3/uL (ref 150.0–400.0)
RBC: 4.63 Mil/uL (ref 3.87–5.11)
RDW: 14.5 % (ref 11.5–15.5)
WBC: 8.2 10*3/uL (ref 4.0–10.5)

## 2020-08-02 LAB — COMPREHENSIVE METABOLIC PANEL
ALT: 22 U/L (ref 0–35)
AST: 38 U/L — ABNORMAL HIGH (ref 0–37)
Albumin: 4.1 g/dL (ref 3.5–5.2)
Alkaline Phosphatase: 49 U/L (ref 39–117)
BUN: 10 mg/dL (ref 6–23)
CO2: 29 mEq/L (ref 19–32)
Calcium: 9.1 mg/dL (ref 8.4–10.5)
Chloride: 104 mEq/L (ref 96–112)
Creatinine, Ser: 0.93 mg/dL (ref 0.40–1.20)
GFR: 61 mL/min (ref >60.00)
Glucose, Bld: 99 mg/dL (ref 70–99)
Potassium: 3.9 mEq/L (ref 3.5–5.1)
Sodium: 140 mEq/L (ref 135–145)
Total Bilirubin: 0.6 mg/dL (ref 0.2–1.2)
Total Protein: 6.8 g/dL (ref 6.0–8.3)

## 2020-08-02 LAB — LIPASE: Lipase: 82 U/L — ABNORMAL HIGH (ref 11.0–59.0)

## 2020-08-03 ENCOUNTER — Ambulatory Visit
Admission: RE | Admit: 2020-08-03 | Discharge: 2020-08-03 | Disposition: A | Discharge status: Home / Self Care | Payer: Medicare Other | Source: Ambulatory Visit | Attending: Family Medicine | Admitting: Family Medicine

## 2020-08-03 ENCOUNTER — Other Ambulatory Visit: Payer: Self-pay

## 2020-08-03 DIAGNOSIS — K760 Fatty (change of) liver, not elsewhere classified: Secondary | ICD-10-CM | POA: Diagnosis not present

## 2020-08-03 DIAGNOSIS — R1011 Right upper quadrant pain: Secondary | ICD-10-CM | POA: Diagnosis not present

## 2020-08-07 ENCOUNTER — Telehealth: Payer: Self-pay | Admitting: Cardiovascular Disease

## 2020-08-07 DIAGNOSIS — I10 Essential (primary) hypertension: Secondary | ICD-10-CM

## 2020-08-07 MED ORDER — AMLODIPINE BESYLATE 5 MG PO TABS: 5.0000 mg | ORAL_TABLET | Freq: Every day | ORAL | 3 refills | Status: DC

## 2020-08-07 NOTE — Telephone Encounter (Signed)
   Pt c/o medication issue:  1. Name of Medication:   amLODipine (NORVASC) 2.5 MG tablet    2. How are you currently taking this medication (dosage and times per day)? Take 1 tablet (2.5 mg total) by mouth daily.Patient taking differently: Take 5 mg by mouth daily.  3. Are you having a reaction (difficulty breathing--STAT)?   4. What is your medication issue? Pt said when she saw Dr. Oval Linsey she increased her amlodipine to '5mg'$  but no new prescription sent to her pharmacy, she said she is running out of meds

## 2020-08-07 NOTE — Telephone Encounter (Signed)
Essential hypertension-BP today 130/80.  Well-controlled at home. Increase amlodipine 5 mg Heart healthy low-sodium diet-salty 6 given Increase physical activity as tolerated   Raynaud's disease- diagnosed 10 years ago.  Recommendation made for increasing amlodipine to see if symptoms improve. Increase amlodipine to 5 mg daily     Disposition: Follow-up with Dr. Oval Linsey in 4 to 6 months.   Jossie Ng. Cleaver NP-C  Above message from office visit 6/7 Refilled as requested, patient aware

## 2020-08-13 ENCOUNTER — Ambulatory Visit (AMBULATORY_SURGERY_CENTER): Payer: Medicare Other | Admitting: Gastroenterology

## 2020-08-13 ENCOUNTER — Other Ambulatory Visit: Payer: Self-pay

## 2020-08-13 ENCOUNTER — Encounter: Payer: Self-pay | Admitting: Gastroenterology

## 2020-08-13 VITALS — BP 132/68 | HR 89 | Temp 97.8°F | Resp 15 | Ht 64.0 in | Wt 160.0 lb

## 2020-08-13 DIAGNOSIS — K2970 Gastritis, unspecified, without bleeding: Secondary | ICD-10-CM

## 2020-08-13 DIAGNOSIS — K219 Gastro-esophageal reflux disease without esophagitis: Secondary | ICD-10-CM | POA: Diagnosis not present

## 2020-08-13 DIAGNOSIS — K200 Eosinophilic esophagitis: Secondary | ICD-10-CM

## 2020-08-13 DIAGNOSIS — E039 Hypothyroidism, unspecified: Secondary | ICD-10-CM | POA: Diagnosis not present

## 2020-08-13 DIAGNOSIS — I10 Essential (primary) hypertension: Secondary | ICD-10-CM | POA: Diagnosis not present

## 2020-08-13 DIAGNOSIS — K5730 Diverticulosis of large intestine without perforation or abscess without bleeding: Secondary | ICD-10-CM

## 2020-08-13 DIAGNOSIS — R1310 Dysphagia, unspecified: Secondary | ICD-10-CM | POA: Diagnosis not present

## 2020-08-13 DIAGNOSIS — R197 Diarrhea, unspecified: Secondary | ICD-10-CM

## 2020-08-13 DIAGNOSIS — Z1211 Encounter for screening for malignant neoplasm of colon: Secondary | ICD-10-CM

## 2020-08-13 DIAGNOSIS — K2950 Unspecified chronic gastritis without bleeding: Secondary | ICD-10-CM | POA: Diagnosis not present

## 2020-08-13 MED ORDER — SODIUM CHLORIDE 0.9 % IV SOLN: 500.0000 mL | Freq: Once | INTRAVENOUS | Status: DC

## 2020-08-13 NOTE — Op Note (Signed)
[facility] Patient Name: Christy Moore Procedure Date: 08/13/2020 7:50 AM MRN: [id number] Endoscopist: Milus Banister , MD Age: 75 Referring MD:  Date of Birth: 1945/11/27 Gender: Female Account #: [id number] Procedure:                Upper GI endoscopy Indications:              Dysphagia, recent vomitting illness Medicines:                Monitored Anesthesia Care Procedure:                Pre-Anesthesia Assessment:                           - Prior to the procedure, a History and Physical                            was performed, and patient medications and                            allergies were reviewed. The patient's tolerance of                            previous anesthesia was also reviewed. The risks                            and benefits of the procedure and the sedation                            options and risks were discussed with the patient.                            All questions were answered, and informed consent                            was obtained. Prior Anticoagulants: The patient has                            taken no previous anticoagulant or antiplatelet                            agents. ASA Grade Assessment: II - A patient with                            mild systemic disease. After reviewing the risks                            and benefits, the patient was deemed in                            satisfactory condition to undergo the procedure.                           After obtaining informed consent, the endoscope was  passed under direct vision. Throughout the                            procedure, the patient's blood pressure, pulse, and                            oxygen saturations were monitored continuously. The                            GIF [id number] was introduced through the                            mouth, and advanced to the second part of duodenum.                            The upper GI  endoscopy was accomplished without                            difficulty. The patient tolerated the procedure                            well. Scope In: Scope Out: Findings:                 Mild inflammation characterized by erythema,                            friability and granularity was found in the gastric                            antrum. Biopsies were taken with a cold forceps for                            histology.                           Biopsies were taken with a cold forceps from distal                            and proximal esophagus in separate jars.                           The exam was otherwise without abnormality. Complications:            No immediate complications. Estimated blood loss:                            None. Estimated Blood Loss:     Estimated blood loss: none. Impression:               - Non-specific gastritis. Biopsied to check for H.                            pylori.                           - The  examination was otherwise normal.                           - Biopsies were taken with a cold forceps from the                            esopahgus to check for Eosinophilic Esophagitis. Recommendation:           - Patient has a contact number available for                            emergencies. The signs and symptoms of potential                            delayed complications were discussed with the                            patient. Return to normal activities tomorrow.                            Written discharge instructions were provided to the                            patient.                           - Resume previous diet.                           - Continue present medications.                           - Await pathology results. Milus Banister, MD 08/13/2020 8:31:34 AM This report has been signed electronically.

## 2020-08-13 NOTE — Patient Instructions (Signed)
Thank you for letting us take care of your healthcare needs today. Please see handouts given to you on Diverticulosis.   YOU HAD AN ENDOSCOPIC PROCEDURE TODAY AT [facility]:   Refer to the procedure report that was given to you for any specific questions about what was found during the examination.  If the procedure report does not answer your questions, please call your gastroenterologist to clarify.  If you requested that your care partner not be given the details of your procedure findings, then the procedure report has been included in a sealed envelope for you to review at your convenience later.  YOU SHOULD EXPECT: Some feelings of bloating in the abdomen. Passage of more gas than usual.  Walking can help get rid of the air that was put into your GI tract during the procedure and reduce the bloating. If you had a lower endoscopy (such as a colonoscopy or flexible sigmoidoscopy) you may notice spotting of blood in your stool or on the toilet paper. If you underwent a bowel prep for your procedure, you may not have a normal bowel movement for a few days.  Please Note:  You might notice some irritation and congestion in your nose or some drainage.  This is from the oxygen used during your procedure.  There is no need for concern and it should clear up in a day or so.  SYMPTOMS TO REPORT IMMEDIATELY:  Following lower endoscopy (colonoscopy or flexible sigmoidoscopy):  Excessive amounts of blood in the stool  Significant tenderness or worsening of abdominal pains  Swelling of the abdomen that is new, acute  Fever of 100F or higher  Following upper endoscopy (EGD)  Vomiting of blood or coffee ground material  New chest pain or pain under the shoulder blades  Painful or persistently difficult swallowing  New shortness of breath  Fever of 100F or higher  Black, tarry-looking stools  For urgent or emergent issues, a gastroenterologist can be reached at any hour by calling  [phone]. Do not use MyChart messaging for urgent concerns.    DIET:  We do recommend a small meal at first, but then you may proceed to your regular diet.  Drink plenty of fluids but you should avoid alcoholic beverages for 24 hours.  ACTIVITY:  You should plan to take it easy for the rest of today and you should NOT DRIVE or use heavy machinery until tomorrow (because of the sedation medicines used during the test).    FOLLOW UP: Our staff will call the number listed on your records 48-72 hours following your procedure to check on you and address any questions or concerns that you may have regarding the information given to you following your procedure. If we do not reach you, we will leave a message.  We will attempt to reach you two times.  During this call, we will ask if you have developed any symptoms of COVID 19. If you develop any symptoms (ie: fever, flu-like symptoms, shortness of breath, cough etc.) before then, please call [phone].  If you test positive for Covid 19 in the 2 weeks post procedure, please call and report this information to [place].    If any biopsies were taken you will be contacted by phone or by letter within the next 1-3 weeks.  Please call us at [phone] if you have not heard about the biopsies in 3 weeks.    SIGNATURES/CONFIDENTIALITY: You and/or your care partner have signed paperwork which will  be entered into your electronic medical record.  These signatures attest to the fact that that the information above on your After Visit Summary has been reviewed and is understood.  Full responsibility of the confidentiality of this discharge information lies with you and/or your care-partner.

## 2020-08-13 NOTE — Progress Notes (Signed)
Called to room to assist during endoscopic procedure.  Patient ID and intended procedure confirmed with present staff. Received instructions for my participation in the procedure from the performing physician.  

## 2020-08-13 NOTE — Progress Notes (Signed)
Report to PACU, RN, vss, BBS= Clear.  

## 2020-08-13 NOTE — Progress Notes (Signed)
VS by CW. ?

## 2020-08-13 NOTE — Op Note (Signed)
[facility] Patient Name: Christy Moore Procedure Date: 08/13/2020 7:50 AM MRN: [id number] Endoscopist: Milus Banister , MD Age: 75 Referring MD:  Date of Birth: 1945/10/30 Gender: Female Account #: [id number] Procedure:                Colonoscopy Indications:              Screening for colorectal malignant neoplasm, recent                            diarrheal illness Medicines:                Monitored Anesthesia Care Procedure:                Pre-Anesthesia Assessment:                           - Prior to the procedure, a History and Physical                            was performed, and patient medications and                            allergies were reviewed. The patient's tolerance of                            previous anesthesia was also reviewed. The risks                            and benefits of the procedure and the sedation                            options and risks were discussed with the patient.                            All questions were answered, and informed consent                            was obtained. Prior Anticoagulants: The patient has                            taken no previous anticoagulant or antiplatelet                            agents. ASA Grade Assessment: II - A patient with                            mild systemic disease. After reviewing the risks                            and benefits, the patient was deemed in                            satisfactory condition to undergo the procedure.  After obtaining informed consent, the colonoscope                            was passed under direct vision. Throughout the                            procedure, the patient's blood pressure, pulse, and                            oxygen saturations were monitored continuously. The                            CF [id number] was introduced through the anus                            and advanced to the the terminal ileum.  The                            colonoscopy was performed without difficulty. The                            patient tolerated the procedure well. The quality                            of the bowel preparation was good. The terminal                            ileum, ileocecal valve, appendiceal orifice, and                            rectum were photographed. Scope In: 8:04:03 AM Scope Out: 8:17:55 AM Scope Withdrawal Time: 0 hours 10 minutes 16 seconds  Total Procedure Duration: 0 hours 13 minutes 52 seconds  Findings:                 The terminal ileum appeared normal.                           Multiple small and large-mouthed diverticula were                            found in the left colon.                           Biopsies for histology were taken with a cold                            forceps from the entire colon for evaluation of                            microscopic colitis.                           The exam was otherwise without abnormality on  direct and retroflexion views. Complications:            No immediate complications. Estimated blood loss:                            None. Estimated Blood Loss:     Estimated blood loss: none. Impression:               - The examined portion of the ileum was normal.                           - Diverticulosis in the left colon.                           - The examination was otherwise normal on direct                            and retroflexion views.                           - Biopsies were taken with a cold forceps from the                            entire colon for evaluation of microscopic colitis. Recommendation:           - Await pathology results.                           - EGD now. Milus Banister, MD 08/13/2020 8:20:16 AM This report has been signed electronically.

## 2020-08-15 ENCOUNTER — Telehealth: Payer: Self-pay

## 2020-08-15 NOTE — Telephone Encounter (Signed)
Left message on follow up call. 

## 2020-08-15 NOTE — Telephone Encounter (Signed)
Left message on answering machine. 

## 2020-08-23 ENCOUNTER — Encounter: Payer: Self-pay | Admitting: Gastroenterology

## 2020-08-29 DIAGNOSIS — S83242A Other tear of medial meniscus, current injury, left knee, initial encounter: Secondary | ICD-10-CM | POA: Diagnosis not present

## 2020-08-30 ENCOUNTER — Telehealth: Payer: Self-pay | Admitting: Gastroenterology

## 2020-08-30 NOTE — Telephone Encounter (Signed)
Pt just scheduled f/u ov with Dr. Ardis Hughs for 11/06/20 but is asking if there is anything sooner. It looks like Dr. Ardis Hughs wanted to see her in the next few weeks per his notes. Pls call her.

## 2020-08-30 NOTE — Telephone Encounter (Signed)
The pt has been advised what her path report states. She was scheduled for an appt on 11/1 with Dr Ardis Hughs.  She will keep that appt and call if she has any problems prior to that appt.

## 2020-09-21 DIAGNOSIS — Z23 Encounter for immunization: Secondary | ICD-10-CM | POA: Diagnosis not present

## 2020-09-25 DIAGNOSIS — Y999 Unspecified external cause status: Secondary | ICD-10-CM | POA: Diagnosis not present

## 2020-09-25 DIAGNOSIS — S83232A Complex tear of medial meniscus, current injury, left knee, initial encounter: Secondary | ICD-10-CM | POA: Diagnosis not present

## 2020-09-25 DIAGNOSIS — M94262 Chondromalacia, left knee: Secondary | ICD-10-CM | POA: Diagnosis not present

## 2020-09-25 DIAGNOSIS — X58XXXA Exposure to other specified factors, initial encounter: Secondary | ICD-10-CM | POA: Diagnosis not present

## 2020-09-25 DIAGNOSIS — G8918 Other acute postprocedural pain: Secondary | ICD-10-CM | POA: Diagnosis not present

## 2020-10-01 ENCOUNTER — Telehealth: Payer: Self-pay | Admitting: Family Medicine

## 2020-10-01 NOTE — Telephone Encounter (Signed)
Left message for patient to call back and schedule Medicare Annual Wellness Visit (AWV) either virtually or in office. Left  my Christy Moore number [phone]   Last AWV 10/17/19  please schedule at anytime with [facility] Nurse Health Advisor 1 or 2   This should be a 45 minute visit.

## 2020-10-08 DIAGNOSIS — Z23 Encounter for immunization: Secondary | ICD-10-CM | POA: Diagnosis not present

## 2020-10-10 DIAGNOSIS — M25562 Pain in left knee: Secondary | ICD-10-CM | POA: Diagnosis not present

## 2020-10-17 ENCOUNTER — Ambulatory Visit (INDEPENDENT_AMBULATORY_CARE_PROVIDER_SITE_OTHER): Payer: Medicare Other

## 2020-10-17 ENCOUNTER — Other Ambulatory Visit: Payer: Self-pay

## 2020-10-17 VITALS — BP 132/78 | HR 90 | Temp 98.3°F | Ht 64.0 in | Wt 159.0 lb

## 2020-10-17 DIAGNOSIS — Z0000 Encounter for general adult medical examination without abnormal findings: Secondary | ICD-10-CM

## 2020-10-17 NOTE — Progress Notes (Signed)
Subjective:   Christy Moore is a 75 y.o. female who presents for Medicare Annual (Subsequent) preventive examination.  Review of Systems     Cardiac Risk Factors include: advanced age (>45men, >68 women);dyslipidemia;hypertension     Objective:    Today's Vitals   10/17/20 1530  BP: 132/78  Pulse: 90  Temp: 98.3 F (36.8 C)  SpO2: 97%  Weight: 159 lb (72.1 kg)  Height: 5\' 4"  (1.626 m)   Body mass index is 27.29 kg/m.  Advanced Directives 10/17/2020 08/13/2020 10/17/2019 12/11/2018 10/03/2018 05/27/2017 05/23/2016  Does Patient Have a Medical Advance Directive? Yes Yes Yes No No No No  Type of Paramedic of [place];Living will [place];Living will [place];Living will - - - -  Does patient want to make changes to medical advance directive? - No - Patient declined No - Patient declined - - - -  Copy of [place] in Chart? No - copy requested No - copy requested No - copy requested - - - -  Would patient like information on creating a medical advance directive? - - - - No - Patient declined - -    Current Medications (verified) Outpatient Encounter Medications as of 10/17/2020  Medication Sig   amLODipine (NORVASC) 5 MG tablet Take 1 tablet (5 mg total) by mouth daily.   famotidine (PEPCID) 20 MG tablet Take 1 tablet (20 mg total) by mouth at bedtime.   fluticasone (FLONASE) 50 MCG/ACT nasal spray Place 2 sprays into both nostrils daily as needed for allergies.    levothyroxine (SYNTHROID) 100 MCG tablet TAKE 1 TABLET BY MOUTH DAILY.   omeprazole (PRILOSEC OTC) 20 MG tablet Take 2 tablets (40 mg total) by mouth daily. (Patient not taking: No sig reported)   [DISCONTINUED] metoprolol succinate (TOPROL-XL) 25 MG 24 hr tablet 1/2 tablet twice a day   [DISCONTINUED] mupirocin (BACTROBAN) 2 % nasal ointment by Nasal route 2 (two) times daily. Use one-half of tube in each nostril twice daily for five  (5) days. After application, press sides of nose together and gently massage.    [DISCONTINUED] topiramate (TOPAMAX) 25 MG tablet Take 25 mg by mouth at bedtime.     No facility-administered encounter medications on file as of 10/17/2020.    Allergies (verified) Amoxicillin-pot clavulanate, Azithromycin, Celecoxib, Ciprofloxacin, Doxycycline, Erythromycin, Hydrocodone, Hydrocodone-acetaminophen, Levofloxacin, Penicillins, Propoxyphene n-acetaminophen, Rofecoxib, Sulfa antibiotics, and Tetanus toxoid   History: Past Medical History:  Diagnosis Date   Arthritis    Cardiomegaly    Depression    GERD (gastroesophageal reflux disease)    Hypertension    Hypothyroidism    Seizures (HCC)    Sinus tachycardia    Sjogren's syndrome (HCC)    Status post dilation of esophageal narrowing 2012   Dr. Earlean Shawl   Past Surgical History:  Procedure Laterality Date   HAND TENDON SURGERY  02/09/2007   tendon transfer-Dr. Amedeo Plenty   NASAL SEPTUM SURGERY     operated on by Dr. Ernesto Rutherford 20 years ago   OVARIAN CYST REMOVAL     Right Sided Salpingo-oopherectomy   SALPINGOOPHORECTOMY     WISDOM TOOTH EXTRACTION     Family History  Problem Relation Age of Onset   Cancer Mother        breast and lung   Stroke Mother 16   Hypothyroidism Mother    Cancer Father        prostate   Heart attack Father 15   Alcohol  abuse Maternal Grandmother    Alcohol abuse Maternal Grandfather    Allergies Other        Grandmother-pt states she takes after grandmother who had multiple allergies   Colon cancer Neg Hx    Esophageal cancer Neg Hx    Stomach cancer Neg Hx    Rectal cancer Neg Hx    Social History   Socioeconomic History   Marital status: Married    Spouse name: Not on file   Number of children: Not on file   Years of education: Not on file   Highest education level: Not on file  Occupational History   Not on file  Tobacco Use   Smoking status: Never   Smokeless tobacco: Never  Vaping Use    Vaping Use: Never used  Substance and Sexual Activity   Alcohol use: Yes    Comment: occasional   Drug use: No   Sexual activity: Not on file  Other Topics Concern   Not on file  Social History Narrative   Not on file   Social Determinants of Health   Financial Resource Strain: Low Risk    Difficulty of Paying Living Expenses: Not hard at all  Food Insecurity: No Food Insecurity   Worried About Charity fundraiser in the Last Year: Never true   Ran Out of Food in the Last Year: Never true  Transportation Needs: No Transportation Needs   Lack of Transportation (Medical): No   Lack of Transportation (Non-Medical): No  Physical Activity: Inactive   Days of Exercise per Week: 0 days   Minutes of Exercise per Session: 0 min  Stress: No Stress Concern Present   Feeling of Stress : Not at all  Social Connections: Moderately Integrated   Frequency of Communication with Friends and Family: Three times a week   Frequency of Social Gatherings with Friends and Family: Three times a week   Attends Religious Services: More than 4 times per year   Active Member of Clubs or Organizations: No   Attends Archivist Meetings: Never   Marital Status: Married    Tobacco Counseling Counseling given: Not Answered   Clinical Intake:  Pre-visit preparation completed: Yes  Pain : No/denies pain     Nutritional Risks: None Diabetes: No  How often do you need to have someone help you when you read instructions, pamphlets, or other written materials from your doctor or pharmacy?: 1 - Never What is the last grade level you completed in school?: [place] Needed?: No  Information entered by :: [place]   Activities of Daily Living In your present state of health, do you have any difficulty performing the following activities: 10/17/2020  Hearing? N  Vision? N  Difficulty concentrating or making decisions? N  Walking or climbing stairs? N   Dressing or bathing? N  Doing errands, shopping? N  Preparing Food and eating ? N  Using the Toilet? N  In the past six months, have you accidently leaked urine? N  Do you have problems with loss of bowel control? N  Managing your Medications? N  Managing your Finances? N  Housekeeping or managing your Housekeeping? N  Some recent data might be hidden    Patient Care Team: Eulas Post, MD as PCP - General (Family Medicine) Skeet Latch, MD as PCP - Cardiology (Cardiology) Viona Gilmore, [facility] as Pharmacist (Pharmacist)  Indicate any recent Medical Services you may have received from other than [facility]  providers in the past year (date may be approximate).     Assessment:   This is a routine wellness examination for Christy Moore.  Hearing/Vision screen Vision Screening - Comments:: Annual eye exams wear glasses  Dietary issues and exercise activities discussed: Current Exercise Habits: Home exercise routine, Type of exercise: walking, Time (Minutes): 30, Frequency (Times/Week): 5, Weekly Exercise (Minutes/Week): 150, Intensity: Mild, Exercise limited by: orthopedic condition(s)   Goals Addressed   None    Depression Screen PHQ 2/9 Scores 10/17/2020 10/17/2020 08/01/2020 10/17/2019 05/27/2017 05/23/2016 03/24/2016  PHQ - 2 Score 0 0 0 0 6 1 1   PHQ- 9 Score - - - 0 7 - -    Fall Risk Fall Risk  10/17/2020 08/01/2020 10/17/2019 12/01/2018 05/27/2017  Falls in the past year? 0 0 0 0 No  Comment - - - Emmi Telephone Survey: data to providers prior to load -  Number falls in past yr: 0 0 0 - -  Injury with Fall? 0 0 0 - -  Risk for fall due to : - - No Fall Risks - -  Follow up Falls evaluation completed Falls evaluation completed Falls evaluation completed;Falls prevention discussed - -    FALL RISK PREVENTION PERTAINING TO THE HOME:  Any stairs in or around the home? Yes  If so, are there any without handrails? No  Home free of loose throw rugs in walkways, pet beds,  electrical cords, etc? Yes  Adequate lighting in your home to reduce risk of falls? Yes   ASSISTIVE DEVICES UTILIZED TO PREVENT FALLS:  Life alert? No  Use of a cane, walker or w/c? No  Grab bars in the bathroom? No  Shower chair or bench in shower? No  Elevated toilet seat or a handicapped toilet? Yes   TIMED UP AND GO:  Was the test performed? Yes .  Length of time to ambulate 10 feet: 8 sec.   Gait steady and fast without use of assistive device  Cognitive Function: Normal cognitive status assessed by direct observation by this Nurse Health Advisor. No abnormalities found.   MMSE - Mini Mental State Exam 05/27/2017  Not completed: (No Data)        Immunizations Immunization History  Administered Date(s) Administered   Fluad Quad(high Dose 65+) 09/16/2018, 10/03/2019   Influenza Split 11/22/2011   Influenza, High Dose Seasonal PF 09/14/2014, 09/07/2015, 10/07/2016, 10/26/2017, 09/16/2018, 09/21/2020   Influenza,inj,Quad PF,6+ Mos 10/05/2013   Influenza,inj,quad, With Preservative 10/06/2016, 11/02/2017   Influenza-Unspecified 11/22/2011, 10/05/2013, 09/14/2014, 09/07/2015, 10/06/2016, 10/07/2016, 10/26/2017, 11/02/2017   PFIZER(Purple Top)SARS-COV-2 Vaccination 02/11/2019, 03/08/2019, 10/04/2019, 04/20/2020   Pfizer Covid-19 Vaccine Bivalent Booster 91yrs & up 10/08/2020   Pneumococcal Conjugate-13 10/05/2013   Pneumococcal Polysaccharide-23 02/19/2015   Zoster Recombinat (Shingrix) 10/08/2020   Zoster, Live 01/17/2010    TDAP status: Due, Education has been provided regarding the importance of this vaccine. Advised may receive this vaccine at local pharmacy or Health Dept. Aware to provide a copy of the vaccination record if obtained from local pharmacy or Health Dept. Verbalized acceptance and understanding.  Flu Vaccine status: Up to date  Pneumococcal vaccine status: Up to date  Covid-19 vaccine status: Completed vaccines  Qualifies for Shingles Vaccine? Yes    Zostavax completed No   Shingrix Completed?: Yes  Screening Tests Health Maintenance  Topic Date Due   MAMMOGRAM  Never done   TETANUS/TDAP  02/19/2035 (Originally 02/17/1964)   Zoster Vaccines- Shingrix (2 of 2) 12/03/2020   COLONOSCOPY (Pts 45-65yrs Insurance coverage will  need to be confirmed)  08/14/2030   INFLUENZA VACCINE  Completed   DEXA SCAN  Completed   COVID-19 Vaccine  Completed   Hepatitis C Screening  Completed   HPV VACCINES  Aged Out    Health Maintenance  Health Maintenance Due  Topic Date Due   MAMMOGRAM  Never done    Colorectal cancer screening: Type of screening: Colonoscopy. Completed 08/13/2020. Repeat every 10 years  Mammogram status: Completed Patient declined . Repeat every year  Bone Density status: Completed 06/25/2020. Results reflect: Bone density results: OSTEOPENIA. Repeat every 5 years.  Lung Cancer Screening: (Low Dose CT Chest recommended if Age 65-80 years, 30 pack-year currently smoking OR have quit w/in 15years.) does not qualify.   Lung Cancer Screening Referral: n/a  Additional Screening:  Hepatitis C Screening: does not qualify; Completed 05/20/2017  Vision Screening: Recommended annual ophthalmology exams for early detection of glaucoma and other disorders of the eye. Is the patient up to date with their annual eye exam?  Yes  Who is the provider or what is the name of the office in which the patient attends annual eye exams? Dr.Miller  If pt is not established with a provider, would they like to be referred to a provider to establish care? No .   Dental Screening: Recommended annual dental exams for proper oral hygiene  Community Resource Referral / Chronic Care Management: CRR required this visit?  No   CCM required this visit?  No      Plan:     I have personally reviewed and noted the following in the patient's chart:   Medical and social history Use of alcohol, tobacco or illicit drugs  Current medications and  supplements including opioid prescriptions.  Functional ability and status Nutritional status Physical activity Advanced directives List of other physicians Hospitalizations, surgeries, and ER visits in previous 12 months Vitals Screenings to include cognitive, depression, and falls Referrals and appointments  In addition, I have reviewed and discussed with patient certain preventive protocols, quality metrics, and best practice recommendations. A written personalized care plan for preventive services as well as general preventive health recommendations were provided to patient.     Randel Pigg, LPN   [id number]   Nurse Notes: none

## 2020-10-17 NOTE — Patient Instructions (Signed)
Christy Moore , Thank you for taking time to come for your Medicare Wellness Visit. I appreciate your ongoing commitment to your health goals. Please review the following plan we discussed and let me know if I can assist you in the future.   Screening recommendations/referrals: Colonoscopy: 08/13/2020 Mammogram: declined  Bone Density: 06/25/2020 Recommended yearly ophthalmology/optometry visit for glaucoma screening and checkup Recommended yearly dental visit for hygiene and checkup  Vaccinations: Influenza vaccine: completed  Pneumococcal vaccine: completed series  Tdap vaccine: allergy  Shingles vaccine: completed series     Advanced directives: will provide copies   Conditions/risks identified: none   Next appointment: none    Preventive Care 4 Years and Older, Female Preventive care refers to lifestyle choices and visits with your health care provider that can promote health and wellness. What does preventive care include? A yearly physical exam. This is also called an annual well check. Dental exams once or twice a year. Routine eye exams. Ask your health care provider how often you should have your eyes checked. Personal lifestyle choices, including: Daily care of your teeth and gums. Regular physical activity. Eating a healthy diet. Avoiding tobacco and drug use. Limiting alcohol use. Practicing safe sex. Taking low-dose aspirin every day. Taking vitamin and mineral supplements as recommended by your health care provider. What happens during an annual well check? The services and screenings done by your health care provider during your annual well check will depend on your age, overall health, lifestyle risk factors, and family history of disease. Counseling  Your health care provider may ask you questions about your: Alcohol use. Tobacco use. Drug use. Emotional well-being. Home and relationship well-being. Sexual activity. Eating habits. History of  falls. Memory and ability to understand (cognition). Work and work Statistician. Reproductive health. Screening  You may have the following tests or measurements: Height, weight, and BMI. Blood pressure. Lipid and cholesterol levels. These may be checked every 5 years, or more frequently if you are over 38 years old. Skin check. Lung cancer screening. You may have this screening every year starting at age 54 if you have a 30-pack-year history of smoking and currently smoke or have quit within the past 15 years. Fecal occult blood test (FOBT) of the stool. You may have this test every year starting at age 34. Flexible sigmoidoscopy or colonoscopy. You may have a sigmoidoscopy every 5 years or a colonoscopy every 10 years starting at age 59. Hepatitis C blood test. Hepatitis B blood test. Sexually transmitted disease (STD) testing. Diabetes screening. This is done by checking your blood sugar (glucose) after you have not eaten for a while (fasting). You may have this done every 1-3 years. Bone density scan. This is done to screen for osteoporosis. You may have this done starting at age 76. Mammogram. This may be done every 1-2 years. Talk to your health care provider about how often you should have regular mammograms. Talk with your health care provider about your test results, treatment options, and if necessary, the need for more tests. Vaccines  Your health care provider may recommend certain vaccines, such as: Influenza vaccine. This is recommended every year. Tetanus, diphtheria, and acellular pertussis (Tdap, Td) vaccine. You may need a Td booster every 10 years. Zoster vaccine. You may need this after age 30. Pneumococcal 13-valent conjugate (PCV13) vaccine. One dose is recommended after age 8. Pneumococcal polysaccharide (PPSV23) vaccine. One dose is recommended after age 58. Talk to your health care provider about which screenings and vaccines  you need and how often you need  them. This information is not intended to replace advice given to you by your health care provider. Make sure you discuss any questions you have with your health care provider. Document Released: 01/19/2015 Document Revised: 09/12/2015 Document Reviewed: 10/24/2014 Elsevier Interactive Patient Education  2017 [place] Prevention in the Home Falls can cause injuries. They can happen to people of all ages. There are many things you can do to make your home safe and to help prevent falls. What can I do on the outside of my home? Regularly fix the edges of walkways and driveways and fix any cracks. Remove anything that might make you trip as you walk through a door, such as a raised step or threshold. Trim any bushes or trees on the path to your home. Use bright outdoor lighting. Clear any walking paths of anything that might make someone trip, such as rocks or tools. Regularly check to see if handrails are loose or broken. Make sure that both sides of any steps have handrails. Any raised decks and porches should have guardrails on the edges. Have any leaves, snow, or ice cleared regularly. Use sand or salt on walking paths during winter. Clean up any spills in your garage right away. This includes oil or grease spills. What can I do in the bathroom? Use night lights. Install grab bars by the toilet and in the tub and shower. Do not use towel bars as grab bars. Use non-skid mats or decals in the tub or shower. If you need to sit down in the shower, use a plastic, non-slip stool. Keep the floor dry. Clean up any water that spills on the floor as soon as it happens. Remove soap buildup in the tub or shower regularly. Attach bath mats securely with double-sided non-slip rug tape. Do not have throw rugs and other things on the floor that can make you trip. What can I do in the bedroom? Use night lights. Make sure that you have a light by your bed that is easy to reach. Do not use  any sheets or blankets that are too big for your bed. They should not hang down onto the floor. Have a firm chair that has side arms. You can use this for support while you get dressed. Do not have throw rugs and other things on the floor that can make you trip. What can I do in the kitchen? Clean up any spills right away. Avoid walking on wet floors. Keep items that you use a lot in easy-to-reach places. If you need to reach something above you, use a strong step stool that has a grab bar. Keep electrical cords out of the way. Do not use floor polish or wax that makes floors slippery. If you must use wax, use non-skid floor wax. Do not have throw rugs and other things on the floor that can make you trip. What can I do with my stairs? Do not leave any items on the stairs. Make sure that there are handrails on both sides of the stairs and use them. Fix handrails that are broken or loose. Make sure that handrails are as long as the stairways. Check any carpeting to make sure that it is firmly attached to the stairs. Fix any carpet that is loose or worn. Avoid having throw rugs at the top or bottom of the stairs. If you do have throw rugs, attach them to the floor with carpet tape. Make sure  that you have a light switch at the top of the stairs and the bottom of the stairs. If you do not have them, ask someone to add them for you. What else can I do to help prevent falls? Wear shoes that: Do not have high heels. Have rubber bottoms. Are comfortable and fit you well. Are closed at the toe. Do not wear sandals. If you use a stepladder: Make sure that it is fully opened. Do not climb a closed stepladder. Make sure that both sides of the stepladder are locked into place. Ask someone to hold it for you, if possible. Clearly mark and make sure that you can see: Any grab bars or handrails. First and last steps. Where the edge of each step is. Use tools that help you move around (mobility aids)  if they are needed. These include: Canes. Walkers. Scooters. Crutches. Turn on the lights when you go into a dark area. Replace any light bulbs as soon as they burn out. Set up your furniture so you have a clear path. Avoid moving your furniture around. If any of your floors are uneven, fix them. If there are any pets around you, be aware of where they are. Review your medicines with your doctor. Some medicines can make you feel dizzy. This can increase your chance of falling. Ask your doctor what other things that you can do to help prevent falls. This information is not intended to replace advice given to you by your health care provider. Make sure you discuss any questions you have with your health care provider. Document Released: 10/19/2008 Document Revised: 05/31/2015 Document Reviewed: 01/27/2014 Elsevier Interactive Patient Education  2017 [employer].

## 2020-10-23 ENCOUNTER — Encounter (HOSPITAL_BASED_OUTPATIENT_CLINIC_OR_DEPARTMENT_OTHER): Payer: Self-pay | Admitting: Cardiovascular Disease

## 2020-10-23 ENCOUNTER — Ambulatory Visit (INDEPENDENT_AMBULATORY_CARE_PROVIDER_SITE_OTHER): Payer: Medicare Other | Admitting: Cardiovascular Disease

## 2020-10-23 ENCOUNTER — Other Ambulatory Visit: Payer: Self-pay

## 2020-10-23 VITALS — BP 130/80 | HR 78 | Ht 64.0 in | Wt 160.6 lb

## 2020-10-23 DIAGNOSIS — I7300 Raynaud's syndrome without gangrene: Secondary | ICD-10-CM | POA: Diagnosis not present

## 2020-10-23 DIAGNOSIS — I10 Essential (primary) hypertension: Secondary | ICD-10-CM | POA: Diagnosis not present

## 2020-10-23 NOTE — Progress Notes (Signed)
Cardiology Office Note   Date:  10/23/2020   ID:  Christy Moore, DOB 26-Apr-1945, MRN [id number]  PCP:  Eulas Post, MD  Cardiologist:   Skeet Latch, MD   No chief complaint on file.     History of Present Illness: Christy Moore is a 75 y.o. female with sjogren's disease, hypertension, GERD, and cardiomegaly who comes in today for follow-up. She had a nuclear stress test in 2014 that revealed LVEF 60% and no ischemia. At her last appointment, LVH was revealed on her EKG. Given this and cardiomegaly referred for an echo 05/2020 which showed grade 1 diastolic dysfunction and LVEF 50-55%. She followed with [name] NP 06/2020 and amlodipine was increased due to elevated blood pressure and raynaud's disease.  Today, she is accompanied by her husband. Overall, she is feeling fine. She tried taking omeprazole but had a severe adverse reaction. Two hours after taking omeprazole, she experienced painful dry heaving until epistaxis and cold flashes. For the following week, she had diarrhea. However, in the 2 weeks following taking omeprazole, she did not have indigestion or acid reflux. She tolerates amlodipine. She had arthroscopic surgery on her L knee and she is recovering well. Her L knee swelling is reportedly mild and she was not assigned physical therapy. She has not been able to exercise after the surgery. She denies any palpitations, chest pain, shortness of breath, lightheadedness, headaches, syncope, orthopnea, PND, or exertional symptoms.   Past Medical History:  Diagnosis Date   Arthritis    Cardiomegaly    Depression    GERD (gastroesophageal reflux disease)    Hypertension    Hypothyroidism    Seizures (HCC)    Sinus tachycardia    Sjogren's syndrome ([place])    Status post dilation of esophageal narrowing 2012   Dr. Earlean Shawl    Past Surgical History:  Procedure Laterality Date   HAND TENDON SURGERY  02/09/2007   tendon transfer-Dr. Amedeo Plenty   NASAL SEPTUM  SURGERY     operated on by Dr. Ernesto Rutherford 20 years ago   OVARIAN CYST REMOVAL     Right Sided Salpingo-oopherectomy   SALPINGOOPHORECTOMY     WISDOM TOOTH EXTRACTION       Current Outpatient Medications  Medication Sig Dispense Refill   amLODipine (NORVASC) 5 MG tablet Take 1 tablet (5 mg total) by mouth daily. 90 tablet 3   famotidine (PEPCID) 20 MG tablet Take 1 tablet (20 mg total) by mouth at bedtime.     fluticasone (FLONASE) 50 MCG/ACT nasal spray Place 2 sprays into both nostrils daily as needed for allergies.      levothyroxine (SYNTHROID) 100 MCG tablet TAKE 1 TABLET BY MOUTH DAILY. 90 tablet 3   No current facility-administered medications for this visit.    Allergies:   Amoxicillin-pot clavulanate, Azithromycin, Celecoxib, Ciprofloxacin, Doxycycline, Erythromycin, Hydrocodone, Hydrocodone-acetaminophen, Levofloxacin, Penicillins, Propoxyphene n-acetaminophen, Rofecoxib, Sulfa antibiotics, and Tetanus toxoid    Social History:  The patient  reports that she has never smoked. She has never used smokeless tobacco. She reports current alcohol use. She reports that she does not use drugs.   Family History:  The patient's family history includes Alcohol abuse in her maternal grandfather and maternal grandmother; Allergies in an other family member; Cancer in her father and mother; Heart attack (age of onset: 37) in her father; Hypothyroidism in her mother; Stroke (age of onset: 26) in her mother.    ROS:  Please see the history of present illness.  (+)  Dry Heaving (+) Epistaxis (+) Cold flashes (+) diarrhea (+) L Knee swelling  All other systems are reviewed and negative.    PHYSICAL EXAM: VS:  BP 130/80 (BP Location: Right Arm, Patient Position: Sitting, Cuff Size: Normal)   Pulse 78   Ht 5\' 4"  (1.626 m)   Wt 160 lb 9.6 oz (72.8 kg)   BMI 27.57 kg/m  , BMI Body mass index is 27.57 kg/m. GENERAL:  Well appearing HEENT:  Pupils equal round and reactive, fundi not  visualized, oral mucosa unremarkable NECK:  No jugular venous distention, waveform within normal limits, carotid upstroke brisk and symmetric, no bruits LUNGS:  Clear to auscultation bilaterally HEART:  RRR.  PMI not displaced or sustained,S1 and S2 within normal limits, no S3, no S4, no clicks, no rubs, no murmurs ABD:  Flat, positive bowel sounds normal in frequency in pitch, no bruits, no rebound, no guarding, no midline pulsatile mass, no hepatomegaly, no splenomegaly EXT:  2 plus pulses throughout, no edema, no cyanosis no clubbing SKIN:  No rashes no nodules NEURO:  Cranial nerves II through XII grossly intact, motor grossly intact throughout PSYCH:  Cognitively intact, oriented to person place and time   EKG:   10/23/2020: Sinus rhythm Rate 78 bpm RBBB LAFB 04/10/20: Sinus rhythm, rate 92 bpm, RBBB and LAFB, LVH with repolarization  Echo 05/22/2020 1. Left ventricular ejection fraction, by estimation, is 50 to 55%. The  left ventricle has low normal function. The left ventricle has no regional  wall motion abnormalities. Left ventricular diastolic parameters are  consistent with Grade I diastolic  dysfunction (impaired relaxation). Elevated left atrial pressure. The  average left ventricular global longitudinal strain is -22.3 %. The global  longitudinal strain is normal.   2. Right ventricular systolic function is normal. The right ventricular  size is normal. Tricuspid regurgitation signal is inadequate for assessing  PA pressure.   3. The mitral valve is normal in structure. Mild mitral valve  regurgitation. No evidence of mitral stenosis.   4. The aortic valve is tricuspid. Aortic valve regurgitation is not  visualized. Mild aortic valve sclerosis is present, with no evidence of  aortic valve stenosis.   5. The inferior vena cava is normal in size with greater than 50%  respiratory variability, suggesting right atrial pressure of 3 mmHg.   Recent Labs: 03/12/2020: TSH  2.08 08/02/2020: ALT 22; BUN 10; Creatinine, Ser 0.93; Hemoglobin 14.2; Platelets 218.0; Potassium 3.9; Sodium 140    Lipid Panel    Component Value Date/Time   CHOL 163 02/14/2015 0936   TRIG 106.0 02/14/2015 0936   HDL 52.70 02/14/2015 0936   CHOLHDL 3 02/14/2015 0936   VLDL 21.2 02/14/2015 0936   LDLCALC 89 02/14/2015 0936      Wt Readings from Last 3 Encounters:  10/23/20 160 lb 9.6 oz (72.8 kg)  10/17/20 159 lb (72.1 kg)  08/13/20 160 lb (72.6 kg)      ASSESSMENT AND PLAN: Hypertension BP well-controlled on amlodipine.  It is also helping with her Raynaud's.  She will start back exercising soon.  She hasn't had lipids checked recently.  She will come back for fasting lipids and CMP.  Raynaud's disease Symptoms improved on amlodipine.  Current medicines are reviewed at length with the patient today.  The patient does not have concerns regarding medicines.  The following changes have been made:  no change  Labs/ tests ordered today include:   Orders Placed This Encounter  Procedures   Lipid  panel   Comprehensive metabolic panel   EKG 48-XAFH     Disposition:   FU with Reshunda Strider C. Oval Linsey, MD, [facility] in 1 year   I,Mykaella Javier,acting as a scribe for Skeet Latch, MD.,have documented all relevant documentation on the behalf of Skeet Latch, MD,as directed by  Skeet Latch, MD while in the presence of Skeet Latch, MD.  I, [place] Oval Linsey, MD have reviewed all documentation for this visit.  The documentation of the exam, diagnosis, procedures, and orders on 10/23/2020 are all accurate and complete.   Signed, Dustin Burrill C. Oval Linsey, MD, [facility]  10/23/2020 7:04 PM    [place]

## 2020-10-23 NOTE — Assessment & Plan Note (Signed)
Symptoms improved on amlodipine.

## 2020-10-23 NOTE — Assessment & Plan Note (Addendum)
BP well-controlled on amlodipine.  It is also helping with her Raynaud's.  She will start back exercising soon.  She hasn't had lipids checked recently.  She will come back for fasting lipids and CMP.

## 2020-10-23 NOTE — Patient Instructions (Addendum)
Medication Instructions:  Your physician recommends that you continue on your current medications as directed. Please refer to the Current Medication list given to you today.   *If you need a refill on your cardiac medications before your next appointment, please call your pharmacy*  Lab Work: FASTING LP/CMET SOON   Testing/Procedures: NONE  Follow-Up: At [facility], you and your health needs are our priority.  As part of our continuing mission to provide you with exceptional heart care, we have created designated Provider Care Teams.  These Care Teams include your primary Cardiologist (physician) and Advanced Practice Providers (APPs -  Physician Assistants and Nurse Practitioners) who all work together to provide you with the care you need, when you need it.  We recommend signing up for the patient portal called "MyChart".  Sign up information is provided on this After Visit Summary.  MyChart is used to connect with patients for Virtual Visits (Telemedicine).  Patients are able to view lab/test results, encounter notes, upcoming appointments, etc.  Non-urgent messages can be sent to your provider as well.   To learn more about what you can do with MyChart, go to [email or web address].    Your next appointment:   12 month(s)  The format for your next appointment:   In Person  Provider:   Tiffany Saltaire, MD          

## 2020-11-06 ENCOUNTER — Encounter: Payer: Self-pay | Admitting: Gastroenterology

## 2020-11-06 ENCOUNTER — Ambulatory Visit (INDEPENDENT_AMBULATORY_CARE_PROVIDER_SITE_OTHER): Payer: Medicare Other | Admitting: Gastroenterology

## 2020-11-06 VITALS — BP 126/68 | HR 70 | Ht 64.5 in | Wt 156.0 lb

## 2020-11-06 DIAGNOSIS — R12 Heartburn: Secondary | ICD-10-CM | POA: Diagnosis not present

## 2020-11-06 MED ORDER — FAMOTIDINE 20 MG PO TABS: 20.0000 mg | ORAL_TABLET | Freq: Every morning | ORAL | Status: DC

## 2020-11-06 MED ORDER — FAMOTIDINE 40 MG PO TABS: 40.0000 mg | ORAL_TABLET | Freq: Every day | ORAL | Status: DC

## 2020-11-06 NOTE — Progress Notes (Signed)
Review of pertinent gastrointestinal problems: 1.  Dysphagia, benign GE junction stricture.  EGD 2004 Dr. Carlean Purl treated with [facility] dilator.  Transferred care to Dr. Earlie Raveling.  EGD sometime around a year 2012, repeat dilation with good relief of her symptoms.  Repeat EGD August 2022 after she establish care with Dr. Ardis Hughs found mild nonspecific gastritis.  Biopsies from stomach and esophagus were all normal. 2.  Routine risk for colon cancer.  Colonoscopy August 2022 found no polyps.  Colon was randomly biopsied due to recent diarrheal illness.  The biopsies from her colon were all normal.   HPI: This is a very pleasant 75 year old woman  I saw her in the office in July 2022.  She describes her chronic GERD symptoms of pyrosis.  She was on famotidine 40 mg at bedtime and possibly 20 mg in the morning.  I made some changes to her medicines and so she has been taking Pepcid 20 mg at bedtime and omeprazole 40 mg every morning.  She has Sjogren's syndrome with resultant very dry mouth  Since her procedures she has been taking famotidine 40 mg sometime between dinner and bedtime.  She will periodically take a 20 mg famotidine in the morning.  On that regimen she is usually quite good.  She only has to grab for the extra morning H2 blocker a couple times a week.  She had a real significant reaction to omeprazole.  Shortly after taking it for the first time she had vomiting and diarrhea fairly dramatically.  She will not try that again.  She has no dysphagia  Her weight is overall stable  ROS: complete GI ROS as described in HPI, all other review negative.  Constitutional:  No unintentional weight loss   Past Medical History:  Diagnosis Date   Arthritis    Cardiomegaly    Depression    GERD (gastroesophageal reflux disease)    Hypertension    Hypothyroidism    Seizures (HCC)    Sinus tachycardia    Sjogren's syndrome ([place])    Status post dilation of esophageal narrowing 2012    Dr. Earlean Shawl    Past Surgical History:  Procedure Laterality Date   HAND TENDON SURGERY  02/09/2007   tendon transfer-Dr. Amedeo Plenty   NASAL SEPTUM SURGERY     operated on by Dr. Ernesto Rutherford 20 years ago   OVARIAN CYST REMOVAL     Right Sided Salpingo-oopherectomy   SALPINGOOPHORECTOMY     WISDOM TOOTH EXTRACTION      Current Outpatient Medications  Medication Sig Dispense Refill   amLODipine (NORVASC) 5 MG tablet Take 1 tablet (5 mg total) by mouth daily. 90 tablet 3   famotidine (PEPCID) 40 MG tablet Take 40 mg by mouth daily.     fluticasone (FLONASE) 50 MCG/ACT nasal spray Place 2 sprays into both nostrils daily as needed for allergies.      levothyroxine (SYNTHROID) 100 MCG tablet TAKE 1 TABLET BY MOUTH DAILY. 90 tablet 3   No current facility-administered medications for this visit.    Allergies as of 11/06/2020 - Review Complete 10/23/2020  Allergen Reaction Noted   Amoxicillin-pot clavulanate  12/20/2007   Azithromycin  12/20/2007   Celecoxib  12/20/2007   Ciprofloxacin  12/20/2007   Doxycycline  12/20/2007   Erythromycin  12/20/2007   Hydrocodone  12/20/2007   Hydrocodone-acetaminophen  12/20/2007   Levofloxacin  12/20/2007   Penicillins  10/03/2018   Propoxyphene n-acetaminophen  12/20/2007   Rofecoxib  12/20/2007   Sulfa antibiotics  10/03/2018   Tetanus toxoid  12/20/2007    Family History  Problem Relation Age of Onset   Cancer Mother        breast and lung   Stroke Mother 85   Hypothyroidism Mother    Cancer Father        prostate   Heart attack Father 17   Alcohol abuse Maternal Grandmother    Alcohol abuse Maternal Grandfather    Allergies Other        Grandmother-pt states she takes after grandmother who had multiple allergies   Colon cancer Neg Hx    Esophageal cancer Neg Hx    Stomach cancer Neg Hx    Rectal cancer Neg Hx     Social History   Socioeconomic History   Marital status: Married    Spouse name: Not on file   Number of children:  Not on file   Years of education: Not on file   Highest education level: Not on file  Occupational History   Not on file  Tobacco Use   Smoking status: Never   Smokeless tobacco: Never  Vaping Use   Vaping Use: Never used  Substance and Sexual Activity   Alcohol use: Yes    Comment: occasional   Drug use: No   Sexual activity: Not on file  Other Topics Concern   Not on file  Social History Narrative   Not on file   Social Determinants of Health   Financial Resource Strain: Low Risk    Difficulty of Paying Living Expenses: Not hard at all  Food Insecurity: No Food Insecurity   Worried About Charity fundraiser in the Last Year: Never true   Ran Out of Food in the Last Year: Never true  Transportation Needs: No Transportation Needs   Lack of Transportation (Medical): No   Lack of Transportation (Non-Medical): No  Physical Activity: Inactive   Days of Exercise per Week: 0 days   Minutes of Exercise per Session: 0 min  Stress: No Stress Concern Present   Feeling of Stress : Not at all  Social Connections: Moderately Integrated   Frequency of Communication with Friends and Family: Three times a week   Frequency of Social Gatherings with Friends and Family: Three times a week   Attends Religious Services: More than 4 times per year   Active Member of Clubs or Organizations: No   Attends Archivist Meetings: Never   Marital Status: Married  Human resources officer Violence: Not At Risk   Fear of Current or Ex-Partner: No   Emotionally Abused: No   Physically Abused: No   Sexually Abused: No     Physical Exam: BP 126/68   Pulse 70   Ht 5' 4.5" (1.638 m)   Wt 156 lb (70.8 kg)   BMI 26.36 kg/m  Constitutional: generally well-appearing Psychiatric: alert and oriented x3 Abdomen: soft, nontender, nondistended, no obvious ascites, no peritoneal signs, normal bowel sounds No peripheral edema noted in lower extremities  Assessment and plan: 75 y.o. female with  chronic GERD, Sjogren's syndrome with dry mouth  I explained to her that it is definitely safe for her to remain on famotidine 40 mg pills 1 pill in the evening hours, closest at bedtime is probably best.  She can also take a morning famotidine 20 or 40 mg what ever it takes to control his GERD symptoms on a daily basis.  She has no alarm symptoms.  She will follow-up on an as-needed basis  only.  Please see the "Patient Instructions" section for addition details about the plan.  Owens Loffler, MD [facility] 11/06/2020, 10:49 AM   Total time on date of encounter was 20 minutes (this included time spent preparing to see the patient reviewing records; obtaining and/or reviewing separately obtained history; performing a medically appropriate exam and/or evaluation; counseling and educating the patient and family if present; ordering medications, tests or procedures if applicable; and documenting clinical information in the health record).

## 2020-11-06 NOTE — Patient Instructions (Signed)
If you are age 73 or older, your body mass index should be between 23-30. Your Body mass index is 26.36 kg/m. If this is out of the aforementioned range listed, please consider follow up with your Primary Care Provider. ________________________________________________________  The [facility] providers would like to encourage you to use [facility] to communicate with providers for non-urgent requests or questions.  Due to long hold times on the telephone, sending your provider a message by [facility] may be a faster and more efficient way to get a response.  Please allow 48 business hours for a response.  Please remember that this is for non-urgent requests.  _______________________________________________________  CONTINUE: Famotidine 40mg  one tablet at bedtime each night.  START: Famotidine 20mg  one tablet every morning.  Thank you for entrusting me with your care and choosing [facility].  Dr Ardis Hughs

## 2020-11-12 ENCOUNTER — Other Ambulatory Visit: Payer: Self-pay

## 2020-11-12 ENCOUNTER — Other Ambulatory Visit (HOSPITAL_COMMUNITY): Payer: Self-pay | Admitting: Specialist

## 2020-11-12 ENCOUNTER — Ambulatory Visit (HOSPITAL_COMMUNITY)
Admission: RE | Admit: 2020-11-12 | Discharge: 2020-11-12 | Disposition: A | Discharge status: Home / Self Care | Payer: Medicare Other | Source: Ambulatory Visit | Attending: Specialist | Admitting: Specialist

## 2020-11-12 DIAGNOSIS — M7989 Other specified soft tissue disorders: Secondary | ICD-10-CM

## 2020-11-12 DIAGNOSIS — M79662 Pain in left lower leg: Secondary | ICD-10-CM

## 2020-12-14 ENCOUNTER — Other Ambulatory Visit: Payer: Self-pay | Admitting: Family Medicine

## 2020-12-14 ENCOUNTER — Ambulatory Visit: Payer: Medicare Other | Admitting: Pharmacist

## 2020-12-14 DIAGNOSIS — K219 Gastro-esophageal reflux disease without esophagitis: Secondary | ICD-10-CM

## 2020-12-14 DIAGNOSIS — I10 Essential (primary) hypertension: Secondary | ICD-10-CM

## 2020-12-14 NOTE — Progress Notes (Signed)
Chronic Care Management Pharmacy Note  12/14/2020 Name:  Christy Moore MRN:  [id number] DOB:  01/15/1945  Summary: Pt is not checking BP at home  Recommendations/Changes made from today's visit: -Recommended repeat vitamin D level -Recommended BP monitoring at home at least once weekly -Recommended limiting meloxicam use with ongoing GERD  Plan: BP assessment in 3-4 months  Subjective: Christy Moore is an 75 y.o. year old female who is a primary patient of Burchette, Alinda Sierras, MD.  The CCM team was consulted for assistance with disease management and care coordination needs.    Engaged with patient by telephone for follow up visit in response to provider referral for pharmacy case management and/or care coordination services.   Consent to Services:  The patient was given information about Chronic Care Management services, agreed to services, and gave verbal consent prior to initiation of services.  Please see initial visit note for detailed documentation.   Patient Care Team: Eulas Post, MD as PCP - General (Family Medicine) Skeet Latch, MD as PCP - Cardiology (Cardiology) Viona Gilmore, [facility] as Pharmacist (Pharmacist)  Recent office visits: 10/17/20 Christy Pigg, LPN: Patient presented for AWV.  08/01/20 Christy Littler, MD: Patient presented for abdominal pain.  07/04/20 Christy Littler, MD: Patient presented for vertigo and review of DEXA scan results.  Recent consult visits: 11/06/20 Owens Loffler, MD (gastro): Patient presented for heartburn follow up. Follow up PRN.  10/23/20 Skeet Latch, MD (cardiology): Patient presented for Raynaud's and HTN follow up.  10/10/20 Elizabeth Sauer, PA (emergeortho): Patient presented for tear of meniscus in knee.  08/29/20 Sydnee Cabal, MD (emergeortho): Patient presented for osteoarthritis.  07/13/20 Owens Loffler, MD (gastro): Patient presented for heartburn follow up. Prescribed esomeprazole 40 mg  daily.  Hospital visits: None in previous 6 months   Objective:  Lab Results  Component Value Date   CREATININE 0.93 08/02/2020   BUN 10 08/02/2020   GFR 61.00 08/02/2020   GFRNONAA >60 12/11/2018   GFRAA >60 12/11/2018   NA 140 08/02/2020   K 3.9 08/02/2020   CALCIUM 9.1 08/02/2020   CO2 29 08/02/2020   GLUCOSE 99 08/02/2020    Lab Results  Component Value Date/Time   GFR 61.00 08/02/2020 09:09 AM   GFR 67.19 03/12/2020 09:14 AM    Last diabetic Eye exam:  Lab Results  Component Value Date/Time   HMDIABEYEEXA No Retinopathy 02/05/2015 12:00 AM    Last diabetic Foot exam: No results found for: HMDIABFOOTEX   Lab Results  Component Value Date   CHOL 163 02/14/2015   HDL 52.70 02/14/2015   LDLCALC 89 02/14/2015   TRIG 106.0 02/14/2015   CHOLHDL 3 02/14/2015    Hepatic Function Latest Ref Rng & Units 08/02/2020 02/14/2015 01/22/2012  Total Protein 6.0 - 8.3 g/dL 6.8 6.8 7.1  Albumin 3.5 - 5.2 g/dL 4.1 4.1 4.1  AST 0 - 37 U/L 38(H) 23 21  ALT 0 - 35 U/L 22 12 14   Alk Phosphatase 39 - 117 U/L 49 43 43  Total Bilirubin 0.2 - 1.2 mg/dL 0.6 0.8 0.8  Bilirubin, Direct 0.0 - 0.3 mg/dL - 0.1 0.0    Lab Results  Component Value Date/Time   TSH 2.08 03/12/2020 09:14 AM   TSH 1.78 06/29/2018 09:45 AM    CBC Latest Ref Rng & Units 08/02/2020 12/11/2018 10/04/2018  WBC 4.0 - 10.5 K/uL 8.2 10.3 8.9  Hemoglobin 12.0 - 15.0 g/dL 14.2 14.9 15.4(H)  Hematocrit 36.0 - 46.0 % 42.4  45.9 49.8(H)  Platelets 150.0 - 400.0 K/uL 218.0 200 192    No results found for: VD25OH  Clinical ASCVD: No  The ASCVD Risk score (Arnett DK, et al., 2019) failed to calculate for the following reasons:   Cannot find a previous HDL lab   Cannot find a previous total cholesterol lab    Depression screen [facility] 2/9 10/17/2020 10/17/2020 08/01/2020  Decreased Interest 0 0 0  Down, Depressed, Hopeless 0 0 0  PHQ - 2 Score 0 0 0  Altered sleeping - - -  Tired, decreased energy - - -  Change in  appetite - - -  Feeling bad or failure about yourself  - - -  Trouble concentrating - - -  Moving slowly or fidgety/restless - - -  Suicidal thoughts - - -  PHQ-9 Score - - -  Difficult doing work/chores - - -      Social History   Tobacco Use  Smoking Status Never  Smokeless Tobacco Never   BP Readings from Last 3 Encounters:  11/06/20 126/68  10/23/20 130/80  10/17/20 132/78   Pulse Readings from Last 3 Encounters:  11/06/20 70  10/23/20 78  10/17/20 90   Wt Readings from Last 3 Encounters:  11/06/20 156 lb (70.8 kg)  10/23/20 160 lb 9.6 oz (72.8 kg)  10/17/20 159 lb (72.1 kg)   BMI Readings from Last 3 Encounters:  11/06/20 26.36 kg/m  10/23/20 27.57 kg/m  10/17/20 27.29 kg/m    Assessment/Interventions: Review of patient past medical history, allergies, medications, health status, including review of consultants reports, laboratory and other test data, was performed as part of comprehensive evaluation and provision of chronic care management services.   SDOH:  (Social Determinants of Health) assessments and interventions performed: No  SDOH Screenings   Alcohol Screen: Low Risk    Last Alcohol Screening Score (AUDIT): 1  Depression (PHQ2-9): Low Risk    PHQ-2 Score: 0  Financial Resource Strain: Low Risk    Difficulty of Paying Living Expenses: Not hard at all  Food Insecurity: No Food Insecurity   Worried About Charity fundraiser in the Last Year: Never true   Ran Out of Food in the Last Year: Never true  Housing: Low Risk    Last Housing Risk Score: 0  Physical Activity: Inactive   Days of Exercise per Week: 0 days   Minutes of Exercise per Session: 0 min  Social Connections: Moderately Integrated   Frequency of Communication with Friends and Family: Three times a week   Frequency of Social Gatherings with Friends and Family: Three times a week   Attends Religious Services: More than 4 times per year   Active Member of Clubs or Organizations: No    Attends Archivist Meetings: Never   Marital Status: Married  Stress: No Stress Concern Present   Feeling of Stress : Not at all  Tobacco Use: Low Risk    Smoking Tobacco Use: Never   Smokeless Tobacco Use: Never   Passive Exposure: Not on file  Transportation Needs: No Transportation Needs   Lack of Transportation (Medical): No   Lack of Transportation (Non-Medical): No    CCM Care Plan  Allergies  Allergen Reactions   Amoxicillin-Pot Clavulanate     REACTION: nausea   Azithromycin     REACTION: nausea, Gi   Celecoxib     REACTION: nausea   Ciprofloxacin     REACTION: nausea   Doxycycline  REACTION: gi upset   Erythromycin     REACTION: nause, GI   Hydrocodone     REACTION: nausea   Hydrocodone-Acetaminophen     REACTION: GI upset   Levofloxacin     REACTION: she claims renal failiure   Omeprazole Nausea And Vomiting   Penicillins    Propoxyphene N-Acetaminophen    Rofecoxib     REACTION: nausea   Sulfa Antibiotics    Tetanus Toxoid     Medications Reviewed Today     Reviewed by Viona Gilmore, [facility] (Pharmacist) on 12/14/20 at 1425  Med List Status: <None>   Medication Order Taking? Sig Documenting Provider Last Dose Status Informant  amLODipine (NORVASC) 5 MG tablet [id number] Yes Take 1 tablet (5 mg total) by mouth daily. Skeet Latch, MD Taking Active   famotidine (PEPCID) 20 MG tablet [id number] Yes Take 1 tablet (20 mg total) by mouth every morning. Milus Banister, MD Taking Active   famotidine (PEPCID) 40 MG tablet [id number] Yes Take 1 tablet (40 mg total) by mouth at bedtime. Milus Banister, MD Taking Active   fluticasone [facility]) 50 MCG/ACT nasal spray [id number] No Place 2 sprays into both nostrils daily as needed for allergies.   Patient not taking: Reported on 12/14/2020   [provider] Not Taking Active Self  levothyroxine (SYNTHROID) 100 MCG tablet [id number] Yes TAKE 1 TABLET BY MOUTH EVERY DAY Burchette, Alinda Sierras,  MD Taking Active   meloxicam (MOBIC) 7.5 MG tablet [id number] Yes Take 7.5 mg by mouth daily. [provider] Taking Active   Polyethyl Glycol-Propyl Glycol (SYSTANE) 0.4-0.3 % SOLN [id number] Yes Apply 1 drop to eye. [provider] Taking Active   sodium chloride (OCEAN) 0.65 % nasal spray [id number] Yes Place 1 spray into the nose as needed for congestion. [provider] Taking Active             Patient Active Problem List   Diagnosis Date Noted   Depression 07/13/2020   Thrush 11/03/2012   Sjogren's disease ([place]) 01/22/2012   GERD (gastroesophageal reflux disease) 03/19/2011   Hypertension 03/19/2011   Raynaud's disease 03/19/2011   Cardiomegaly 02/26/2010   Hypothyroidism 02/26/2010   Wrist fracture, left 02/26/2010   Liver hemangioma 02/26/2010   Migraine 02/26/2010   Esophageal stricture 02/26/2010   Weight loss 02/26/2010   OTHER CHRONIC SINUSITIS 12/20/2007   Rosacea 12/20/2007    Immunization History  Administered Date(s) Administered   Fluad Quad(high Dose 65+) 09/16/2018, 10/03/2019   Influenza Split 11/22/2011   Influenza, High Dose Seasonal PF 09/14/2014, 09/07/2015, 10/07/2016, 10/26/2017, 09/16/2018, 09/21/2020   Influenza,inj,Quad PF,6+ Mos 10/05/2013   Influenza,inj,quad, With Preservative 10/06/2016, 11/02/2017   Influenza-Unspecified 11/22/2011, 10/05/2013, 09/14/2014, 09/07/2015, 10/06/2016, 10/07/2016, 10/26/2017, 11/02/2017   PFIZER(Purple Top)SARS-COV-2 Vaccination 02/11/2019, 03/08/2019, 10/04/2019, 04/20/2020   Pfizer Covid-19 Vaccine Bivalent Booster 33yr & up 10/08/2020   Pneumococcal Conjugate-13 10/05/2013   Pneumococcal Polysaccharide-23 02/19/2015   Zoster Recombinat (Shingrix) 10/08/2020   Zoster, Live 01/17/2010   Patient is dealing with her arthritis pain more often lately. She has a gel injection scheduled for the new year. Right now she has been getting some relief from the meloxicam.  Conditions to be  addressed/monitored:  Hypertension, GERD, Hypothyroidism and Allergic Rhinitis  Conditions addressed this visit: Hypertension, GERD  Care Plan : [place] Updates made by PViona Gilmore RArvadasince 12/14/2020 12:00 AM     Problem: Problem: Hypertension, GERD, Hypothyroidism and Allergic Rhinitis      Long-Range  Goal: Patient-Specific Goal   Start Date: 06/08/2020  Expected End Date: 06/08/2021  Recent Progress: On track  Priority: High  Note:   Current Barriers:  Unable to independently monitor therapeutic efficacy  Pharmacist Clinical Goal(s):  Patient will maintain control of blood pressure as evidenced by home blood pressure readings  through collaboration with PharmD and provider.   Interventions: 1:1 collaboration with Eulas Post, MD regarding development and update of comprehensive plan of care as evidenced by provider attestation and co-signature Inter-disciplinary care team collaboration (see longitudinal plan of care) Comprehensive medication review performed; medication list updated in electronic medical record  Hypertension/Raynaud's syndrome (BP goal <140/90) -Controlled -Current treatment: Amlodipine 5 mg 1 tablet daily -Medications previously tried: metoprolol (for HR) -Current home readings: not checking consistently (checking once a week - try to do that) -Current dietary habits: not eating as much -Current exercise habits: not quite as active lately -Denies hypotensive/hypertensive symptoms -Educated on BP goals and benefits of medications for prevention of heart attack, stroke and kidney damage; Exercise goal of 150 minutes per week; Importance of home blood pressure monitoring; Proper BP monitoring technique; -Counseled to monitor BP at home weekly, document, and provide log at future appointments -Counseled on diet and exercise extensively Recommended to continue current medication  GERD (Goal: minimize  symptoms) -Controlled -Current treatment  Famotidine 40 mg 20 mg in the morning and 1 tablet before bedtime Gaviscon as needed -Medications previously tried: Pantoprazole (hurt stomach), omeprazole (N/V)  -Counseled on non-pharmacologic management of symptoms such as elevating the head of your bed, avoiding eating 2-3 hours before bed, avoiding triggering foods such as acidic, spicy, or fatty foods, eating smaller meals, and wearing clothes that are loose around the waist  Recommended limiting meloxicam use with frequent acid reflux symptoms.  Hypothyroidism (Goal: TSH 2 - 4) -Controlled -Current treatment  Levothyroxine 100 mcg 1 tablet daily -Medications previously tried: none  -Recommended to continue current medication  Allergic rhinitis (Goal: minimize symptoms) -Controlled -Current treatment  Flonase 50 mcg/act 2 sprays in both nostrils as needed Flonase sensimist as needed -Medications previously tried: none  -Recommended to continue current medication  Osteopenia (Goal prevent fractures) -Not ideally controlled -Last DEXA Scan: 2022  T-Score femoral neck: -1.8  T-Score total hip: n/a  T-Score lumbar spine: -1.1  T-Score forearm radius: n/a  10-year probability of major osteoporotic fracture: 11.9%  10-year probability of hip fracture: 3.0% -Patient is not a candidate for pharmacologic treatment -Current treatment  No medications -Medications previously tried: none  -Recommend [phone] units of vitamin D daily. Recommend 1200 mg of calcium daily from dietary and supplemental sources. Recommend weight-bearing and muscle strengthening exercises for building and maintaining bone density. -Recommended repeat vitamin D level  Health Maintenance -Vaccine gaps: shingrix (second dose) -Current therapy:  No medications -Educated on Cost vs benefit of each product must be carefully weighed by individual consumer -Patient is satisfied with current therapy and denies  issues -Recommended to continue as is  Patient Goals/Self-Care Activities Patient will:  - take medications as prescribed check blood pressure a few times a week, document, and provide at future appointments target a minimum of 150 minutes of moderate intensity exercise weekly  Follow Up Plan: Telephone follow up appointment with care management team member scheduled for:1 year       Medication Assistance: None required.  Patient affirms current coverage meets needs.  Compliance/Adherence/Medication fill history: Care Gaps: Shingrix (2nd dose), mammogram  Star-Rating Drugs: None  Patient's preferred pharmacy is:  [id number]- [place], [place] - [address] [id number]: [phone]: [phone] Uses pill box? Yes Pt endorses 100% compliance  We discussed: Current pharmacy is preferred with insurance plan and patient is satisfied with pharmacy services Patient decided to: Continue current medication management strategy  Care Plan and Follow Up Patient Decision:  Patient agrees to Care Plan and Follow-up.  Plan: Telephone follow up appointment with care management team member scheduled for:  1 year  MJeni Salles PharmD BArlingtonPharmacist LOccidental Petroleumat BGun Club [phone]

## 2020-12-14 NOTE — Patient Instructions (Addendum)
Hi Sy[place]ity,  It was great to catch up with you again! Thanks for the movie recommendation again.  Please reach out to me if you have any questions or need anything before our follow up!  Best, Maddie  Jeni Salles, PharmD, [facility] [place]   Visit Information   Goals Addressed   None    Patient Care Plan: CCM Pharmacy Care Plan     Problem Identified: Problem: Hypertension, GERD, Hypothyroidism and Allergic Rhinitis      Long-Range Goal: Patient-Specific Goal   Start Date: 06/08/2020  Expected End Date: 06/08/2021  Recent Progress: On track  Priority: High  Note:   Current Barriers:  Unable to independently monitor therapeutic efficacy  Pharmacist Clinical Goal(s):  Patient will maintain control of blood pressure as evidenced by home blood pressure readings  through collaboration with PharmD and provider.   Interventions: 1:1 collaboration with Eulas Post, MD regarding development and update of comprehensive plan of care as evidenced by provider attestation and co-signature Inter-disciplinary care team collaboration (see longitudinal plan of care) Comprehensive medication review performed; medication list updated in electronic medical record  Hypertension/Raynaud's syndrome (BP goal <140/90) -Controlled -Current treatment: Amlodipine 5 mg 1 tablet daily -Medications previously tried: metoprolol (for HR) -Current home readings: not checking consistently (checking once a week - try to do that) -Current dietary habits: not eating as much -Current exercise habits: not quite as active lately -Denies hypotensive/hypertensive symptoms -Educated on BP goals and benefits of medications for prevention of heart attack, stroke and kidney damage; Exercise goal of 150 minutes per week; Importance of home blood pressure monitoring; Proper BP monitoring technique; -Counseled to monitor BP at home weekly, document,  and provide log at future appointments -Counseled on diet and exercise extensively Recommended to continue current medication  GERD (Goal: minimize symptoms) -Controlled -Current treatment  Famotidine 40 mg 20 mg in the morning and 1 tablet before bedtime Gaviscon as needed -Medications previously tried: Pantoprazole (hurt stomach), omeprazole (N/V)  -Counseled on non-pharmacologic management of symptoms such as elevating the head of your bed, avoiding eating 2-3 hours before bed, avoiding triggering foods such as acidic, spicy, or fatty foods, eating smaller meals, and wearing clothes that are loose around the waist  Recommended limiting meloxicam use with frequent acid reflux symptoms.  Hypothyroidism (Goal: TSH 2 - 4) -Controlled -Current treatment  Levothyroxine 100 mcg 1 tablet daily -Medications previously tried: none  -Recommended to continue current medication  Allergic rhinitis (Goal: minimize symptoms) -Controlled -Current treatment  Flonase 50 mcg/act 2 sprays in both nostrils as needed Flonase sensimist as needed -Medications previously tried: none  -Recommended to continue current medication  Osteopenia (Goal prevent fractures) -Not ideally controlled -Last DEXA Scan: 2022  T-Score femoral neck: -1.8  T-Score total hip: n/a  T-Score lumbar spine: -1.1  T-Score forearm radius: n/a  10-year probability of major osteoporotic fracture: 11.9%  10-year probability of hip fracture: 3.0% -Patient is not a candidate for pharmacologic treatment -Current treatment  No medications -Medications previously tried: none  -Recommend [phone] units of vitamin D daily. Recommend 1200 mg of calcium daily from dietary and supplemental sources. Recommend weight-bearing and muscle strengthening exercises for building and maintaining bone density. -Recommended repeat vitamin D level  Health Maintenance -Vaccine gaps: shingrix (second dose) -Current therapy:  No  medications -Educated on Cost vs benefit of each product must be carefully weighed by individual consumer -Patient is satisfied with current therapy and denies issues -Recommended to continue as  is  Patient Goals/Self-Care Activities Patient will:  - take medications as prescribed check blood pressure a few times a week, document, and provide at future appointments target a minimum of 150 minutes of moderate intensity exercise weekly  Follow Up Plan: Telephone follow up appointment with care management team member scheduled for:1 year       Patient verbalizes understanding of instructions provided today and agrees to view in [place].  Telephone follow up appointment with pharmacy team member scheduled for: 1 year  Viona Gilmore, [facility]

## 2020-12-21 ENCOUNTER — Ambulatory Visit (INDEPENDENT_AMBULATORY_CARE_PROVIDER_SITE_OTHER): Payer: Medicare Other | Admitting: Family Medicine

## 2020-12-21 VITALS — BP 120/70 | HR 95 | Temp 98.3°F | Wt 155.1 lb

## 2020-12-21 DIAGNOSIS — G43109 Migraine with aura, not intractable, without status migrainosus: Secondary | ICD-10-CM

## 2020-12-21 NOTE — Progress Notes (Signed)
Established Patient Office Visit  Subjective:  Patient ID: Christy Moore, female    DOB: March 22, 1945  Age: 75 y.o. MRN: [id number]  CC:  Chief Complaint  Patient presents with   Migraine    X 7 days, back left side of head, family HX of stroke in that area    HPI Christy Moore presents for recent headaches.  Patient has history of migraine headaches dating back to 75 years of age.  She has seen neurologist over at [facility] but has not seen them in some time and will have to establish as a new patient.  She states that she tends to have visual aura prior to migraines.  She states she has had visual aura couple times this week without full-blown headache.  Frequency of headache varies considerably.  Headaches usually last about 3 hours.  Frequently unilateral and aggravated by bright lights.  She cannot recall which medicines she has taken in the past for these.  She apparently has not been on prophylactic medications previously.  She is not sure of specific triggers.  Denies any recent atypical features.  Recent headaches have been consistent with her prior migraines.  No recent head injury.  Past Medical History:  Diagnosis Date   Arthritis    Cardiomegaly    Depression    GERD (gastroesophageal reflux disease)    Hypertension    Hypothyroidism    Seizures (HCC)    Sinus tachycardia    Sjogren's syndrome ([place])    Status post dilation of esophageal narrowing 2012   Dr. Earlean Shawl    Past Surgical History:  Procedure Laterality Date   HAND TENDON SURGERY  02/09/2007   tendon transfer-Dr. Amedeo Plenty   NASAL SEPTUM SURGERY     operated on by Dr. Ernesto Rutherford 20 years ago   OVARIAN CYST REMOVAL     Right Sided Salpingo-oopherectomy   SALPINGOOPHORECTOMY     WISDOM TOOTH EXTRACTION      Family History  Problem Relation Age of Onset   Cancer Mother        breast and lung   Stroke Mother 23   Hypothyroidism Mother    Cancer Father        prostate   Heart attack Father 59    Alcohol abuse Maternal Grandmother    Alcohol abuse Maternal Grandfather    Allergies Other        Grandmother-pt states she takes after grandmother who had multiple allergies   Colon cancer Neg Hx    Esophageal cancer Neg Hx    Stomach cancer Neg Hx    Rectal cancer Neg Hx     Social History   Socioeconomic History   Marital status: Married    Spouse name: Not on file   Number of children: Not on file   Years of education: Not on file   Highest education level: Not on file  Occupational History   Not on file  Tobacco Use   Smoking status: Never   Smokeless tobacco: Never  Vaping Use   Vaping Use: Never used  Substance and Sexual Activity   Alcohol use: Yes    Comment: occasional   Drug use: No   Sexual activity: Not on file  Other Topics Concern   Not on file  Social History Narrative   Not on file   Social Determinants of Health   Financial Resource Strain: Low Risk    Difficulty of Paying Living Expenses: Not hard at all  Food Insecurity:  No Food Insecurity   Worried About Charity fundraiser in the Last Year: Never true   Ran Out of Food in the Last Year: Never true  Transportation Needs: No Transportation Needs   Lack of Transportation (Medical): No   Lack of Transportation (Non-Medical): No  Physical Activity: Inactive   Days of Exercise per Week: 0 days   Minutes of Exercise per Session: 0 min  Stress: No Stress Concern Present   Feeling of Stress : Not at all  Social Connections: Moderately Integrated   Frequency of Communication with Friends and Family: Three times a week   Frequency of Social Gatherings with Friends and Family: Three times a week   Attends Religious Services: More than 4 times per year   Active Member of Clubs or Organizations: No   Attends Archivist Meetings: Never   Marital Status: Married  Human resources officer Violence: Not At Risk   Fear of Current or Ex-Partner: No   Emotionally Abused: No   Physically Abused: No    Sexually Abused: No    Outpatient Medications Prior to Visit  Medication Sig Dispense Refill   amLODipine (NORVASC) 5 MG tablet Take 1 tablet (5 mg total) by mouth daily. 90 tablet 3   famotidine (PEPCID) 20 MG tablet Take 1 tablet (20 mg total) by mouth every morning.     famotidine (PEPCID) 40 MG tablet Take 1 tablet (40 mg total) by mouth at bedtime.     fluticasone (FLONASE) 50 MCG/ACT nasal spray Place 2 sprays into both nostrils daily as needed for allergies.     levothyroxine (SYNTHROID) 100 MCG tablet TAKE 1 TABLET BY MOUTH EVERY DAY 90 tablet 0   meloxicam (MOBIC) 7.5 MG tablet Take 7.5 mg by mouth daily.     Polyethyl Glycol-Propyl Glycol (SYSTANE) 0.4-0.3 % SOLN Apply 1 drop to eye.     sodium chloride (OCEAN) 0.65 % nasal spray Place 1 spray into the nose as needed for congestion.     No facility-administered medications prior to visit.    Allergies  Allergen Reactions   Amoxicillin-Pot Clavulanate     REACTION: nausea   Azithromycin     REACTION: nausea, Gi   Celecoxib     REACTION: nausea   Ciprofloxacin     REACTION: nausea   Doxycycline     REACTION: gi upset   Erythromycin     REACTION: nause, GI   Hydrocodone     REACTION: nausea   Hydrocodone-Acetaminophen     REACTION: GI upset   Levofloxacin     REACTION: she claims renal failiure   Omeprazole Nausea And Vomiting   Penicillins    Propoxyphene N-Acetaminophen    Rofecoxib     REACTION: nausea   Sulfa Antibiotics    Tetanus Toxoid     ROS Review of Systems  Constitutional:  Negative for chills and fever.  Respiratory:  Negative for shortness of breath.   Cardiovascular:  Negative for chest pain.  Neurological:  Positive for headaches. Negative for dizziness, seizures, syncope, speech difficulty and weakness.     Objective:    Physical Exam Vitals reviewed.  Constitutional:      Appearance: Normal appearance.  Eyes:     Extraocular Movements: Extraocular movements intact.     Pupils:  Pupils are equal, round, and reactive to light.  Cardiovascular:     Rate and Rhythm: Normal rate and regular rhythm.  Pulmonary:     Effort: Pulmonary effort is normal.  Breath sounds: Normal breath sounds.  Musculoskeletal:     Cervical back: Neck supple.  Neurological:     General: No focal deficit present.     Mental Status: She is alert.     Cranial Nerves: No cranial nerve deficit.     Motor: No weakness.     Gait: Gait normal.    BP 120/70 (BP Location: Left Arm, Patient Position: Sitting, Cuff Size: Normal)    Pulse 95    Temp 98.3 F (36.8 C) (Oral)    Wt 155 lb 1.6 oz (70.4 kg)    SpO2 99%    BMI 26.21 kg/m  Wt Readings from Last 3 Encounters:  12/21/20 155 lb 1.6 oz (70.4 kg)  11/06/20 156 lb (70.8 kg)  10/23/20 160 lb 9.6 oz (72.8 kg)     Health Maintenance Due  Topic Date Due   MAMMOGRAM  Never done   Zoster Vaccines- Shingrix (2 of 2) 12/03/2020    There are no preventive care reminders to display for this patient.  Lab Results  Component Value Date   TSH 2.08 03/12/2020   Lab Results  Component Value Date   WBC 8.2 08/02/2020   HGB 14.2 08/02/2020   HCT 42.4 08/02/2020   MCV 91.4 08/02/2020   PLT 218.0 08/02/2020   Lab Results  Component Value Date   NA 140 08/02/2020   K 3.9 08/02/2020   CO2 29 08/02/2020   GLUCOSE 99 08/02/2020   BUN 10 08/02/2020   CREATININE 0.93 08/02/2020   BILITOT 0.6 08/02/2020   ALKPHOS 49 08/02/2020   AST 38 (H) 08/02/2020   ALT 22 08/02/2020   PROT 6.8 08/02/2020   ALBUMIN 4.1 08/02/2020   CALCIUM 9.1 08/02/2020   ANIONGAP 12 12/11/2018   GFR 61.00 08/02/2020   Lab Results  Component Value Date   CHOL 163 02/14/2015   Lab Results  Component Value Date   HDL 52.70 02/14/2015   Lab Results  Component Value Date   LDLCALC 89 02/14/2015   Lab Results  Component Value Date   TRIG 106.0 02/14/2015   Lab Results  Component Value Date   CHOLHDL 3 02/14/2015   No results found for: HGBA1C     Assessment & Plan:   Problem List Items Addressed This Visit   None Visit Diagnoses     Migraine with aura and without status migrainosus, not intractable    -  Primary   Relevant Orders   Ambulatory referral to Neurology     Patient has longstanding history of migraine with 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  Increased frequency recently.  She would like to get back in to see neurologist whom she has seen previously over at [facility]  We will go ahead with referral.  We recommended avoidance of triptans given her age.  We deferred any treatment today because she hopes to see neurologist soon to get their input.  No orders of the defined types were placed in this encounter.   Follow-up: No follow-ups on file.    [name], MD

## 2021-01-02 ENCOUNTER — Telehealth: Payer: Self-pay

## 2021-01-02 NOTE — Telephone Encounter (Signed)
--  Caller states she has a sore throat, nasal congestion, loss of appetite, nausea, sneezing since sunday night. Pt had a negative covid test. Pt has tried multiple over the counter medications for her symptoms but none have helped. No fever. Pt denies any other symptoms at this time  01/02/2021 11:44:51 AM Botines, RN, Margreta Journey

## 2021-01-14 DIAGNOSIS — M1712 Unilateral primary osteoarthritis, left knee: Secondary | ICD-10-CM | POA: Diagnosis not present

## 2021-01-21 DIAGNOSIS — M1712 Unilateral primary osteoarthritis, left knee: Secondary | ICD-10-CM | POA: Diagnosis not present

## 2021-01-28 DIAGNOSIS — M13862 Other specified arthritis, left knee: Secondary | ICD-10-CM | POA: Diagnosis not present

## 2021-01-28 DIAGNOSIS — M1712 Unilateral primary osteoarthritis, left knee: Secondary | ICD-10-CM | POA: Diagnosis not present

## 2021-02-04 DIAGNOSIS — M1712 Unilateral primary osteoarthritis, left knee: Secondary | ICD-10-CM | POA: Diagnosis not present

## 2021-02-06 DIAGNOSIS — M1712 Unilateral primary osteoarthritis, left knee: Secondary | ICD-10-CM | POA: Diagnosis not present

## 2021-02-12 DIAGNOSIS — M1712 Unilateral primary osteoarthritis, left knee: Secondary | ICD-10-CM | POA: Diagnosis not present

## 2021-02-14 DIAGNOSIS — M1712 Unilateral primary osteoarthritis, left knee: Secondary | ICD-10-CM | POA: Diagnosis not present

## 2021-02-22 DIAGNOSIS — M1712 Unilateral primary osteoarthritis, left knee: Secondary | ICD-10-CM | POA: Diagnosis not present

## 2021-02-26 DIAGNOSIS — M1712 Unilateral primary osteoarthritis, left knee: Secondary | ICD-10-CM | POA: Diagnosis not present

## 2021-03-01 DIAGNOSIS — M1712 Unilateral primary osteoarthritis, left knee: Secondary | ICD-10-CM | POA: Diagnosis not present

## 2021-03-04 DIAGNOSIS — M1712 Unilateral primary osteoarthritis, left knee: Secondary | ICD-10-CM | POA: Diagnosis not present

## 2021-03-08 DIAGNOSIS — M1712 Unilateral primary osteoarthritis, left knee: Secondary | ICD-10-CM | POA: Diagnosis not present

## 2021-03-13 DIAGNOSIS — M1712 Unilateral primary osteoarthritis, left knee: Secondary | ICD-10-CM | POA: Diagnosis not present

## 2021-03-15 ENCOUNTER — Telehealth: Payer: Self-pay | Admitting: Pharmacist

## 2021-03-15 NOTE — Chronic Care Management (AMB) (Signed)
? ? ?Chronic Care Management ?Pharmacy Assistant  ? ?Name: Christy Moore  MRN: [id number] DOB: 05-06-1945 ? ?Reason for Encounter: Disease State Hypertension Assessment ?  ?Conditions to be addressed/monitored: ?HTN ? 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 No medication changes. ? ?Recent consult visits:  ?none ? ?Hospital visits:  ?None in previous 6 months ? ?Medications: ?Outpatient Encounter Medications as of 03/15/2021  ?Medication Sig  ? amLODipine (NORVASC) 5 MG tablet Take 1 tablet (5 mg total) by mouth daily.  ? famotidine (PEPCID) 20 MG tablet Take 1 tablet (20 mg total) by mouth every morning.  ? famotidine (PEPCID) 40 MG tablet Take 1 tablet (40 mg total) by mouth at bedtime.  ? fluticasone (FLONASE) 50 MCG/ACT nasal spray Place 2 sprays into both nostrils daily as needed for allergies.  ? levothyroxine (SYNTHROID) 100 MCG tablet TAKE 1 TABLET BY MOUTH EVERY DAY  ? meloxicam (MOBIC) 7.5 MG tablet Take 7.5 mg by mouth daily.  ? Polyethyl Glycol-Propyl Glycol (SYSTANE) 0.4-0.3 % SOLN Apply 1 drop to eye.  ? sodium chloride (OCEAN) 0.65 % nasal spray Place 1 spray into the nose as needed for congestion.  ? ?No facility-administered encounter medications on file as of 03/15/2021.  ?Reviewed chart prior to disease state call. Spoke with patient regarding BP ? ?Recent Office Vitals: ?BP Readings from Last 3 Encounters:  ?12/21/20 120/70  ?11/06/20 126/68  ?10/23/20 130/80  ? ?Pulse Readings from Last 3 Encounters:  ?12/21/20 95  ?11/06/20 70  ?10/23/20 78  ?  ?Wt Readings from Last 3 Encounters:  ?12/21/20 155 lb 1.6 oz (70.4 kg)  ?11/06/20 156 lb (70.8 kg)  ?10/23/20 160 lb 9.6 oz (72.8 kg)  ?  ? ?Kidney Function ?Lab Results  ?Component Value Date/Time  ? CREATININE 0.93 08/02/2020 09:09 AM  ? CREATININE 0.86 03/12/2020 09:14 AM  ? GFR 61.00 08/02/2020 09:09 AM  ? GFRNONAA >60 12/11/2018 01:19 PM  ?  GFRAA >60 12/11/2018 01:19 PM  ? ? ?BMP Latest Ref Rng & Units 08/02/2020 03/12/2020 12/11/2018  ?Glucose 70 - 99 mg/dL 99 90 112(H)  ?BUN 6 - 23 mg/dL '10 15 13  '$ ?Creatinine 0.40 - 1.20 mg/dL 0.93 0.86 0.88  ?Sodium 135 - 145 mEq/L 140 142 140  ?Potassium 3.5 - 5.1 mEq/L 3.9 4.2 4.0  ?Chloride 96 - 112 mEq/L 104 104 103  ?CO2 19 - 32 mEq/L '29 31 25  '$ ?Calcium 8.4 - 10.5 mg/dL 9.1 9.1 9.5  ? ? ?Current antihypertensive regimen:  ?Amlodipine 5 mg 1 tablet daily ?How often are you checking your Blood Pressure? infrequently ?Current home BP readings:  ?BP Readings from Last 3 Encounters:  ?12/21/20 120/70  ?11/06/20 126/68  ?10/23/20 130/80  ?Patient reports she has not been checking regularly and denies any hypo/hypertensive symptoms ?What recent interventions/DTPs have been made by any provider to improve Blood Pressure control since last CPP Visit: Patient reports none ?Any recent hospitalizations or ED visits since last visit with CPP? No ?What exercise is being done to improve your Blood Pressure Control?  ?Patient reports she has been having some knee trouble doing stretching exercises from PT. ? ?Adherence Review: ?Is the patient currently on ACE/ARB medication? No ?Does the patient have >5 day gap between last estimated fill dates? No ? ? ? ?Care Gaps: ?BP- 120/70 ( 12/21/20) ?CCM- 12/23 ?AWV- 10/22 ?Mammogram - Overdue ?Zoster Vaccine - Overdue ? ?Star Rating Drugs: ?  None ? ? ? ?Ned Clines CMA ?Clinical Pharmacist Assistant ?[phone] ? ?

## 2021-03-19 DIAGNOSIS — M1712 Unilateral primary osteoarthritis, left knee: Secondary | ICD-10-CM | POA: Diagnosis not present

## 2021-03-26 DIAGNOSIS — M79672 Pain in left foot: Secondary | ICD-10-CM | POA: Diagnosis not present

## 2021-03-26 DIAGNOSIS — M7742 Metatarsalgia, left foot: Secondary | ICD-10-CM | POA: Diagnosis not present

## 2021-03-26 DIAGNOSIS — S93492A Sprain of other ligament of left ankle, initial encounter: Secondary | ICD-10-CM | POA: Diagnosis not present

## 2021-03-27 ENCOUNTER — Other Ambulatory Visit: Payer: Self-pay | Admitting: Orthopaedic Surgery

## 2021-03-27 DIAGNOSIS — M79672 Pain in left foot: Secondary | ICD-10-CM

## 2021-05-07 ENCOUNTER — Ambulatory Visit (INDEPENDENT_AMBULATORY_CARE_PROVIDER_SITE_OTHER): Payer: Medicare Other | Admitting: Family Medicine

## 2021-05-07 ENCOUNTER — Ambulatory Visit: Payer: Medicare Other | Admitting: Family Medicine

## 2021-05-07 ENCOUNTER — Encounter: Payer: Self-pay | Admitting: Family Medicine

## 2021-05-07 VITALS — BP 116/74 | HR 83 | Temp 97.9°F | Ht 64.5 in | Wt 154.0 lb

## 2021-05-07 DIAGNOSIS — R059 Cough, unspecified: Secondary | ICD-10-CM | POA: Diagnosis not present

## 2021-05-07 DIAGNOSIS — R0981 Nasal congestion: Secondary | ICD-10-CM | POA: Diagnosis not present

## 2021-05-07 MED ORDER — CEFUROXIME AXETIL 500 MG PO TABS: 500.0000 mg | ORAL_TABLET | Freq: Two times a day (BID) | ORAL | 0 refills | Status: AC

## 2021-05-07 MED ORDER — BENZONATATE 100 MG PO CAPS: 100.0000 mg | ORAL_CAPSULE | Freq: Three times a day (TID) | ORAL | 0 refills | Status: DC | PRN

## 2021-05-07 NOTE — Progress Notes (Signed)
? ?Established Patient Office Visit ? ?Subjective   ?Patient ID: Christy Moore, female    DOB: 1945/10/12  Age: 76 y.o. MRN: [id number] ? ?Chief Complaint  ?Patient presents with  ? URI  ? ? ?HPI ? ? ?Patient seen with slightly over 2-week history of sinus congestion and cough.  Her husband has had similar symptoms for even longer.  She states that her symptoms started around 14 April.  She developed severe sore throat followed by nasal congestion and now some productive cough.  She had some bilateral ear pressure.  Sinus pressure.  She has tried Mucinex, Advil, and Flonase with minimal improvement.  Cough has been severe at times.  She took one of her husbands Tessalon tablets last night which seemed to help.  She denies any dyspnea.  No recent travels.  Non-smoker ? ?Past Medical History:  ?Diagnosis Date  ? Arthritis   ? Cardiomegaly   ? Depression   ? GERD (gastroesophageal reflux disease)   ? Hypertension   ? Hypothyroidism   ? Seizures ([place])   ? Sinus tachycardia   ? Sjogren's syndrome ([place])   ? Status post dilation of esophageal narrowing 2012  ? Dr. Earlean Shawl  ? ?Past Surgical History:  ?Procedure Laterality Date  ? HAND TENDON SURGERY  02/09/2007  ? tendon transfer-Dr. Amedeo Plenty  ? NASAL SEPTUM SURGERY    ? operated on by Dr. Ernesto Rutherford 20 years ago  ? OVARIAN CYST REMOVAL    ? Right Sided Salpingo-oopherectomy  ? SALPINGOOPHORECTOMY    ? WISDOM TOOTH EXTRACTION    ? ? reports that she has never smoked. She has never used smokeless tobacco. She reports current alcohol use. She reports that she does not use drugs. ?family history includes Alcohol abuse in her maternal grandfather and maternal grandmother; Allergies in an other family member; Cancer in her father and mother; Heart attack (age of onset: 50) in her father; Hypothyroidism in her mother; Stroke (age of onset: 61) in her mother. ?Allergies  ?Allergen Reactions  ? Amoxicillin-Pot Clavulanate   ?  REACTION: nausea  ? Azithromycin   ?  REACTION: nausea, Gi   ? Celecoxib   ?  REACTION: nausea  ? Ciprofloxacin   ?  REACTION: nausea  ? Doxycycline   ?  REACTION: gi upset  ? Erythromycin   ?  REACTION: nause, GI  ? Hydrocodone   ?  REACTION: nausea  ? Hydrocodone-Acetaminophen   ?  REACTION: GI upset  ? Levofloxacin   ?  REACTION: she claims renal failiure  ? Omeprazole Nausea And Vomiting  ? Penicillins   ? Propoxyphene N-Acetaminophen   ? Rofecoxib   ?  REACTION: nausea  ? Sulfa Antibiotics   ? Tetanus Toxoid   ? ? ?Review of Systems  ?Constitutional:  Positive for malaise/fatigue. Negative for chills and fever.  ?HENT:  Positive for sinus pain and sore throat.   ?Respiratory:  Positive for cough. Negative for wheezing.   ?Cardiovascular:  Negative for chest pain.  ? ?  ?Objective:  ?  ? ?BP 116/74 (BP Location: Left Arm, Patient Position: Sitting, Cuff Size: Normal)   Pulse 83   Temp 97.9 ?F (36.6 ?C) (Oral)   Ht 5' 4.5" (1.638 m)   Wt 154 lb (69.9 kg)   SpO2 95%   BMI 26.03 kg/m?  ? ? ?Physical Exam ?Vitals reviewed.  ?Constitutional:   ?   General: She is not in acute distress. ?   Appearance: Normal appearance. She is not  ill-appearing or toxic-appearing.  ?HENT:  ?   Right Ear: Tympanic membrane normal.  ?   Left Ear: Tympanic membrane normal.  ?Cardiovascular:  ?   Rate and Rhythm: Normal rate and regular rhythm.  ?Pulmonary:  ?   Effort: Pulmonary effort is normal.  ?   Breath sounds: Normal breath sounds. No wheezing or rales.  ?Musculoskeletal:  ?   Cervical back: Neck supple.  ?Lymphadenopathy:  ?   Cervical: No cervical adenopathy.  ?Neurological:  ?   Mental Status: She is alert.  ? ? ? ?No results found for any visits on 05/07/21. ? ? ? ?The ASCVD Risk score (Arnett DK, et al., 2019) failed to calculate for the following reasons: ?  Cannot find a previous HDL lab ?  Cannot find a previous total cholesterol lab ? ?  ?Assessment & Plan:  ? ?Patient seen with going on 3-week history of sinus congestion and cough.  She states she does not typically have  allergies this time a year.  Husband had similar process.  With her sinus pressure and pain and productive cough concern is whether she has bacterial sinusitis.  Allergic to multiple antibiotics but has tolerated cephalosporins in the past. ? ?-Ceftin 500 mg twice daily for 7 days ?-Continue Mucinex ?-Tessalon Perles 100 mg 1 to 2 capsules every 8 hours as needed for cough ?-Follow-up probably for any fever or any persistent or worsening symptoms. ? ?No follow-ups on file.  ? ? ?[name], MD ? ?

## 2021-05-10 ENCOUNTER — Other Ambulatory Visit: Payer: Self-pay | Admitting: Family Medicine

## 2021-05-10 ENCOUNTER — Other Ambulatory Visit: Payer: Self-pay

## 2021-05-10 DIAGNOSIS — E038 Other specified hypothyroidism: Secondary | ICD-10-CM

## 2021-05-10 NOTE — Telephone Encounter (Signed)
Last OV- 05/07/21 ?Last TSH labs- 03/12/20 ? ?No future OV scheduled.    May I send in 30 day supply or 90 day supply for refill?    Please advise. ?

## 2021-05-30 ENCOUNTER — Ambulatory Visit (INDEPENDENT_AMBULATORY_CARE_PROVIDER_SITE_OTHER): Payer: Medicare Other | Admitting: Family Medicine

## 2021-05-30 ENCOUNTER — Encounter: Payer: Self-pay | Admitting: Family Medicine

## 2021-05-30 VITALS — BP 118/76 | HR 70 | Temp 99.0°F | Wt 151.3 lb

## 2021-05-30 DIAGNOSIS — J019 Acute sinusitis, unspecified: Secondary | ICD-10-CM | POA: Diagnosis not present

## 2021-05-30 MED ORDER — CEFUROXIME AXETIL 500 MG PO TABS: 500.0000 mg | ORAL_TABLET | Freq: Two times a day (BID) | ORAL | 0 refills | Status: AC

## 2021-05-30 MED ORDER — ONDANSETRON HCL 8 MG PO TABS: 8.0000 mg | ORAL_TABLET | Freq: Four times a day (QID) | ORAL | 0 refills | Status: DC | PRN

## 2021-05-30 NOTE — Progress Notes (Signed)
   Subjective:    Patient ID: Christy Moore, female    DOB: [birth year], 76 y.o.   MRN: [id number]  HPI Here for recurrent stuffy head, PND, ST, and coughing up yellow sputum. No fever. She saw Dr. Elease Hashimoto on 05-07-21 and she was prescribed Cefuroxime. She only took 5 days of it however, because she stopped it due to nausea. She is either allergic to or intolerant of a wide variety of antibiotics. The most common issue she encounters is nausea. After the 5 days of antibiotic she felt better, but then the symptoms came back. Drinking fluids. Benzonatate is helpful for the coughing.    Review of Systems  Constitutional: Negative.   HENT:  Positive for congestion, postnasal drip, sinus pressure and sore throat. Negative for ear pain.   Eyes: Negative.   Respiratory:  Positive for cough. Negative for shortness of breath and wheezing.       Objective:   Physical Exam Constitutional:      Appearance: Normal appearance.  HENT:     Right Ear: Tympanic membrane, ear canal and external ear normal.     Left Ear: Tympanic membrane, ear canal and external ear normal.     Nose: Nose normal.     Mouth/Throat:     Pharynx: Oropharynx is clear.  Eyes:     Conjunctiva/sclera: Conjunctivae normal.  Pulmonary:     Effort: Pulmonary effort is normal.     Breath sounds: Normal breath sounds.  Lymphadenopathy:     Cervical: No cervical adenopathy.  Neurological:     Mental Status: She is alert.          Assessment & Plan:  Partially treated sinusitis. We will give her another 10 day course of Cefuroxime, but we will add Zofran to combat the nausea. Hopefully she can complete the entire course this time. Recheck as needed.  Alysia Penna, MD

## 2021-06-10 ENCOUNTER — Encounter: Payer: Self-pay | Admitting: Family Medicine

## 2021-06-10 DIAGNOSIS — H5203 Hypermetropia, bilateral: Secondary | ICD-10-CM | POA: Diagnosis not present

## 2021-06-10 DIAGNOSIS — M3501 Sicca syndrome with keratoconjunctivitis: Secondary | ICD-10-CM | POA: Diagnosis not present

## 2021-06-10 DIAGNOSIS — H52223 Regular astigmatism, bilateral: Secondary | ICD-10-CM | POA: Diagnosis not present

## 2021-06-10 DIAGNOSIS — D3132 Benign neoplasm of left choroid: Secondary | ICD-10-CM | POA: Diagnosis not present

## 2021-06-10 DIAGNOSIS — H2513 Age-related nuclear cataract, bilateral: Secondary | ICD-10-CM | POA: Diagnosis not present

## 2021-06-10 DIAGNOSIS — H524 Presbyopia: Secondary | ICD-10-CM | POA: Diagnosis not present

## 2021-06-27 ENCOUNTER — Other Ambulatory Visit (INDEPENDENT_AMBULATORY_CARE_PROVIDER_SITE_OTHER): Payer: Medicare Other

## 2021-06-27 DIAGNOSIS — E038 Other specified hypothyroidism: Secondary | ICD-10-CM

## 2021-06-28 LAB — TSH: TSH: 0.852 u[IU]/mL (ref 0.450–4.500)

## 2021-06-28 LAB — SPECIMEN STATUS REPORT

## 2021-07-04 ENCOUNTER — Other Ambulatory Visit: Payer: Medicare Other

## 2021-07-10 ENCOUNTER — Other Ambulatory Visit: Payer: Self-pay | Admitting: Cardiovascular Disease

## 2021-07-10 DIAGNOSIS — I10 Essential (primary) hypertension: Secondary | ICD-10-CM

## 2021-07-10 NOTE — Telephone Encounter (Signed)
Rx request sent to pharmacy.  

## 2021-07-19 ENCOUNTER — Ambulatory Visit (INDEPENDENT_AMBULATORY_CARE_PROVIDER_SITE_OTHER): Payer: Medicare Other

## 2021-07-19 ENCOUNTER — Telehealth: Payer: Self-pay | Admitting: Cardiovascular Disease

## 2021-07-19 DIAGNOSIS — R002 Palpitations: Secondary | ICD-10-CM

## 2021-07-19 DIAGNOSIS — R000 Tachycardia, unspecified: Secondary | ICD-10-CM

## 2021-07-19 NOTE — Telephone Encounter (Signed)
RN returned call to patient to discuss A. Fib episode, feeling better, BP a little lower than normal at 118/73 and HR 101 which is a little higher her normal. Patient endorses being very tired today. RN explained that it can be normal to be tired after an A. Fib episode. Will route to Laurann Montana, NP in clinic for further advisement.

## 2021-07-19 NOTE — Telephone Encounter (Signed)
No documented history of atrial fib per chart review. Recommend 14 day ZIO and f/u in clinic with Dr. Oval Linsey or Loel Dubonnet, NP. Alternatively, can schedule office visit in next 1-2 weeks and can place monitor at clinic visit.   Check HR/BP once per day and keep log. Contact office if HR consistently >110 bpm.   Provide following recommendations: To prevent palpitations: Make sure you are adequately hydrated.  Avoid and/or limit caffeine containing beverages like soda or tea. Exercise regularly.  Manage stress well. Some over the counter medications can cause palpitations such as Benadryl, AdvilPM, TylenolPM. Regular Advil or Tylenol do not cause palpitations.    Loel Dubonnet, NP

## 2021-07-19 NOTE — Progress Notes (Unsigned)
Enrolled patient for a 14 day Zio XT monitor to be mailed to patients home  Dr. Oval Linsey to read

## 2021-07-19 NOTE — Telephone Encounter (Signed)
RN returned call to patient and provided the following recommendations. Patient opted to have Zio mailed to her, order placed.    "No documented history of atrial fib per chart review. Recommend 14 day ZIO and f/u in clinic with Dr. Oval Linsey or Loel Dubonnet, NP. Alternatively, can schedule office visit in next 1-2 weeks and can place monitor at clinic visit.   Check HR/BP once per day and keep log. Contact office if HR consistently >110 bpm.    Provide following recommendations: To prevent palpitations: Make sure you are adequately hydrated.  Avoid and/or limit caffeine containing beverages like soda or tea. Exercise regularly.  Manage stress well. Some over the counter medications can cause palpitations such as Benadryl, AdvilPM, TylenolPM. Regular Advil or Tylenol do not cause palpitations.     Loel Dubonnet, NP "

## 2021-07-19 NOTE — Telephone Encounter (Signed)
Patient c/o Palpitations:  High priority if patient c/o lightheadedness, shortness of breath, or chest pain  How long have you had palpitations/irregular HR/ Afib? Are you having the symptoms now? Started late afternoon around 5 p.m. Stated she has been back in rhythm since 11 p.m. last night. Not currently having symptoms.   Are you currently experiencing lightheadedness, SOB or CP? No  Do you have a history of afib (atrial fibrillation) or irregular heart rhythm? Yes  Have you checked your BP or HR? (document readings if available): 118/73 HR 101  Are you experiencing any other symptoms? No.

## 2021-07-22 DIAGNOSIS — R000 Tachycardia, unspecified: Secondary | ICD-10-CM | POA: Diagnosis not present

## 2021-07-22 DIAGNOSIS — R002 Palpitations: Secondary | ICD-10-CM

## 2021-07-30 ENCOUNTER — Telehealth: Payer: Self-pay | Admitting: Pharmacist

## 2021-07-30 DIAGNOSIS — Z1231 Encounter for screening mammogram for malignant neoplasm of breast: Secondary | ICD-10-CM

## 2021-07-30 NOTE — Chronic Care Management (AMB) (Signed)
Chronic Care Management Pharmacy Assistant   Name: Christy Moore  MRN: [id number] DOB: March 08, 1945  Reason for Encounter: Disease State   Conditions to be addressed/monitored: General Assessment  Recent office visits:  05/30/21 Laurey Morale, MD - Patient presented for Acute non-recurrent sinusitis unspecified. Prescribed Cefuroxime. Prescribed Ondansetron.   05/07/21 Burchette, Alinda Sierras, MD - Patient presented for Cough unspecified and other concerns. Prescribed Benzonatate. Prescribed Cefuroxime Axetil.   Recent consult visits:  07/19/21 [facility]-  Patient presented for Long Term Monitor. No medication changes.  03/26/21 Joanne Gavel, MD ([facility]) - Patient presented for Metatarsalgia of left foot and other concerns. No medication changes.  03/19/21 Starlyn Skeans, MD - Patient presented for Osteoarthritis of left knee joint. No medication changes.  03/19/21 Herbst, Daleen Snook PT -Claims encounter for Osteoarthritis of left knee joint. No other visit details available.  Hospital visits:  None in previous 6 months  Medications: Outpatient Encounter Medications as of 07/30/2021  Medication Sig   amLODipine (NORVASC) 5 MG tablet Take 1 tablet (5 mg total) by mouth daily. Please call to schedule October appt with Dr. Oval Linsey. Thank you!   benzonatate (TESSALON PERLES) 100 MG capsule Take 1 capsule (100 mg total) by mouth 3 (three) times daily as needed for cough. (Patient not taking: Reported on 05/30/2021)   famotidine (PEPCID) 20 MG tablet Take 1 tablet (20 mg total) by mouth every morning.   famotidine (PEPCID) 40 MG tablet Take 1 tablet (40 mg total) by mouth at bedtime.   fluticasone (FLONASE) 50 MCG/ACT nasal spray Place 2 sprays into both nostrils daily as needed for allergies.   levothyroxine (SYNTHROID) 100 MCG tablet TAKE 1 TABLET BY MOUTH EVERY DAY   meloxicam (MOBIC) 7.5 MG tablet Take 7.5 mg by mouth daily.   ondansetron (ZOFRAN) 8 MG  tablet Take 1 tablet (8 mg total) by mouth every 6 (six) hours as needed for nausea or vomiting.   Polyethyl Glycol-Propyl Glycol (SYSTANE) 0.4-0.3 % SOLN Apply 1 drop to eye.   sodium chloride (OCEAN) 0.65 % nasal spray Place 1 spray into the nose as needed for congestion.   No facility-administered encounter medications on file as of 07/30/2021.   Contacted Renate W Cumpian for General Review Call Adherence Review:  Does the Clinical Pharmacist Assistant have access to adherence rates? Yes None   Disease State Questions:  Able to connect with Patient? Yes Did patient have any problems with their health recently? Yes Patient reports she was ordered a 14 day monitor to wear that she did not wear for the full 14 days, she reports she removed it at day 11 as it was sliding off and starting to irritate her skin, she reports she wrote a note on it and has sent the machine back already. She reports while wearing for the 11 days she had not had any episodes or symptoms of Tachycardia she thinks she was dehydrated and had a stomach bug on the initial onset and believes that was the cause. Have you had any admissions or emergency room visits or worsening of your condition(s) since last visit? No  Have you had any visits with new specialists or providers since your last visit? Yes Cardiology for Tachycardia Have you had any new health care problem(s) since your last visit? No  Have you run out of any of your medications since you last spoke with clinical pharmacist? No  Are there any medications you are not taking as prescribed? No  Are you having any issues or side effects with your medications? No  Do you have any other health concerns or questions you want to discuss with your Clinical Pharmacist before your next visit? No  Are there any health concerns that you feel we can do a better job addressing? No  Are you having any problems with any of the following since the last visit: (select  all that apply)  None  12. Any falls since last visit? No  13. Any increased or uncontrolled pain since last visit? No  14. Next visit Type: telephone       Visit with:Pharm 12/23           Care Gaps: BP- 118/76 05/30/21 AWV- 10/22 CCM- 12/23 Mammogram - Arlean Hopping Zoster Vaccine - Overdue COVID Booster - Overdue TDAP - Postponed  Star Rating Drugs: No   Ned Clines CMA Clinical Pharmacist Assistant [phone]

## 2021-07-31 NOTE — Telephone Encounter (Signed)
No documented mammogram on file. Pt states last one was about 15 years ago.  Pt denies breast surgeries, hx breast cancer, or any current issues with her breast. Appt scheduled for Aug 11 on mobile mammogram bus at 1010.

## 2021-08-08 ENCOUNTER — Other Ambulatory Visit: Payer: Self-pay | Admitting: Family Medicine

## 2021-08-08 DIAGNOSIS — R002 Palpitations: Secondary | ICD-10-CM | POA: Diagnosis not present

## 2021-08-08 DIAGNOSIS — R000 Tachycardia, unspecified: Secondary | ICD-10-CM | POA: Diagnosis not present

## 2021-08-16 ENCOUNTER — Ambulatory Visit
Admission: RE | Admit: 2021-08-16 | Discharge: 2021-08-16 | Disposition: A | Discharge status: Home / Self Care | Payer: Medicare Other | Source: Ambulatory Visit | Attending: Family Medicine | Admitting: Family Medicine

## 2021-08-16 DIAGNOSIS — Z1231 Encounter for screening mammogram for malignant neoplasm of breast: Secondary | ICD-10-CM | POA: Diagnosis not present

## 2021-09-06 ENCOUNTER — Telehealth (HOSPITAL_BASED_OUTPATIENT_CLINIC_OR_DEPARTMENT_OTHER): Payer: Self-pay

## 2021-09-06 NOTE — Telephone Encounter (Addendum)
Results called to patient who verbalizes understanding! Patient does not wish to start any new meds at this time. Patient not scheduled to be seen at this time, NP notified.    ----- Message from Loel Dubonnet, NP sent at 09/04/2021  4:20 PM EDT ----- Monitor with predominantly normal sinus rhythm.  Average heart rate 86 bpm.  There were occasional early beats in the top chamber of the heart called PACs occurring 2.2% of the time.  No atrial fibrillation.  If still bothered by palpitations could start Toprol 25 mg daily.  Recommend schedule follow-up with Dr. Oval Linsey or APP.

## 2021-09-19 IMAGING — US US ABDOMEN LIMITED
1 series · 14 of 25 positions shown · non-contrast
Comparison: None.

CLINICAL DATA: Right upper quadrant pain

EXAM:
ULTRASOUND ABDOMEN LIMITED RIGHT UPPER QUADRANT

[Series 1: us abdomen limited · 0.15mm/px · 14 of 53 slices shown]
[im 1/53]
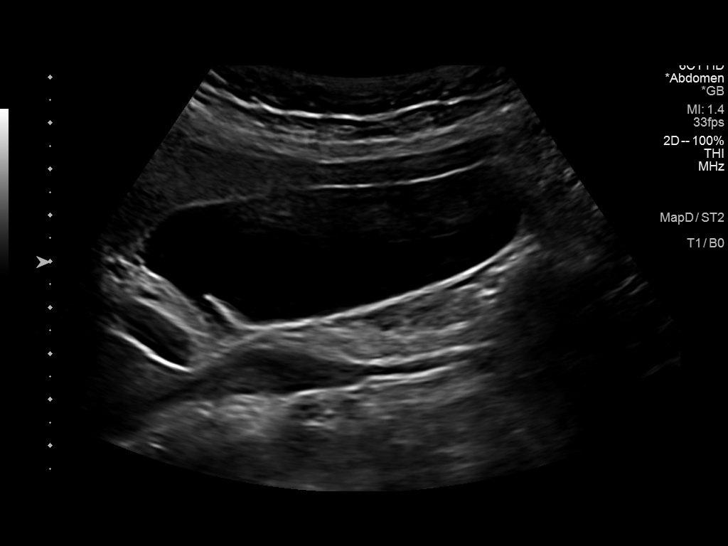
[im 5/53]
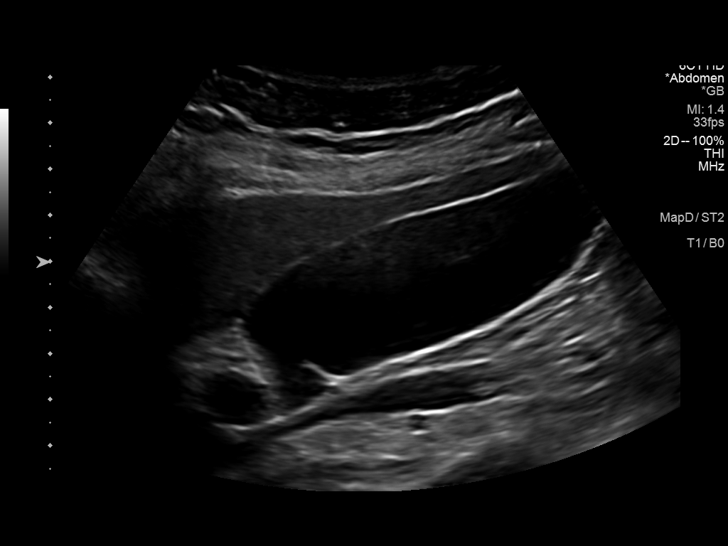
[im 9/53]
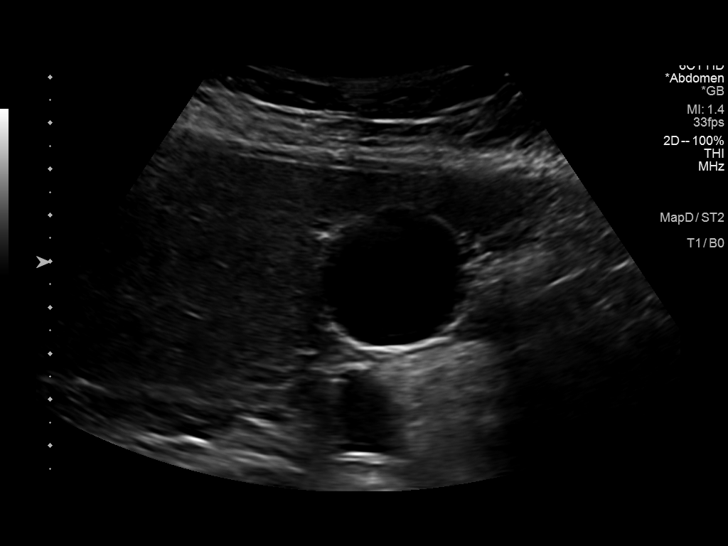
[im 14/53]
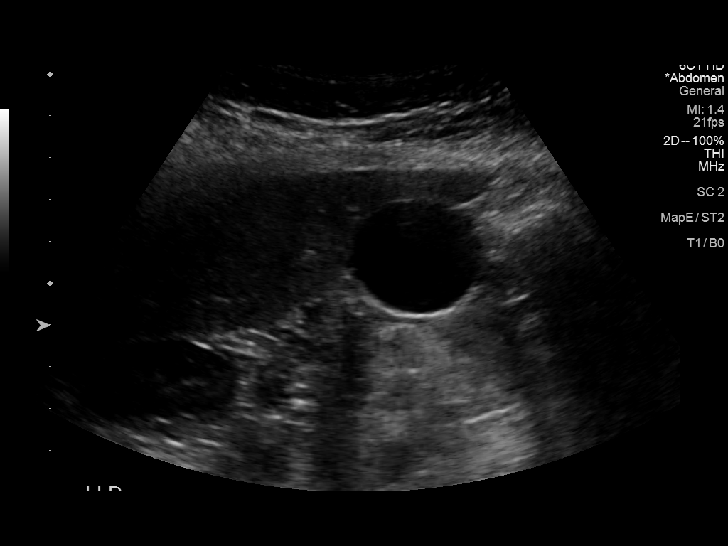
[im 18/53]
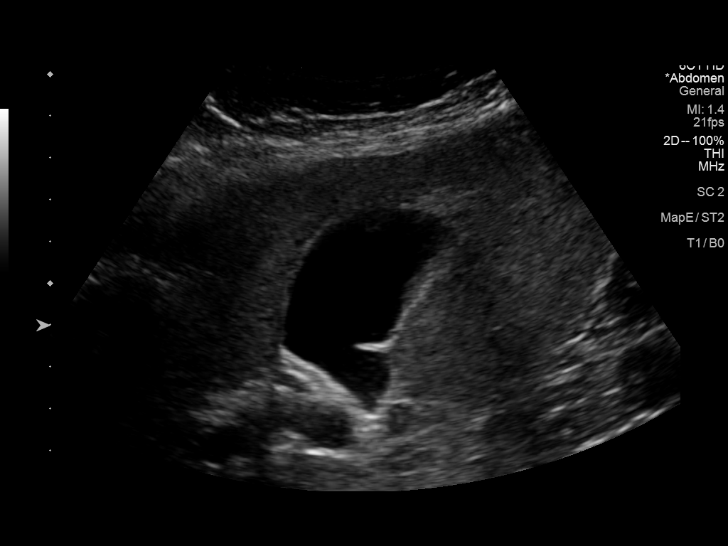
[im 20/53]
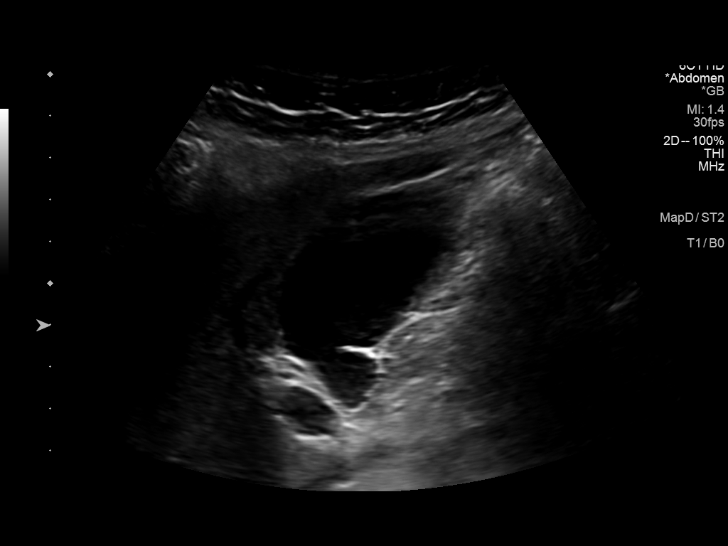
[im 24/53]
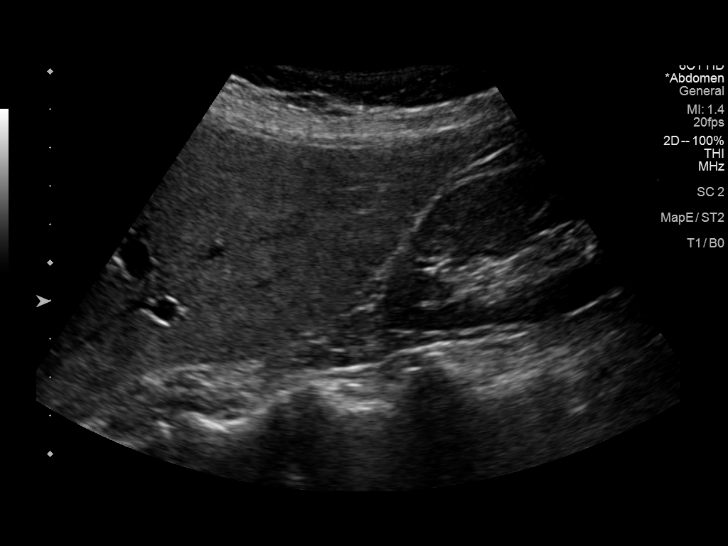
[im 29/53]
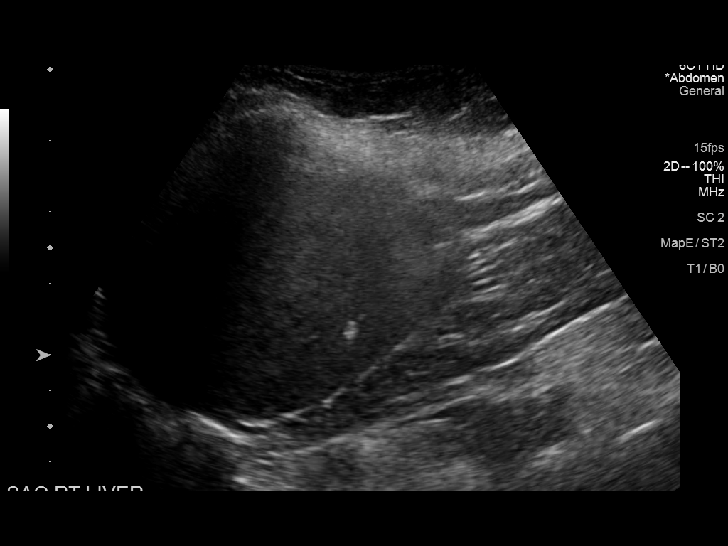
[im 33/53]
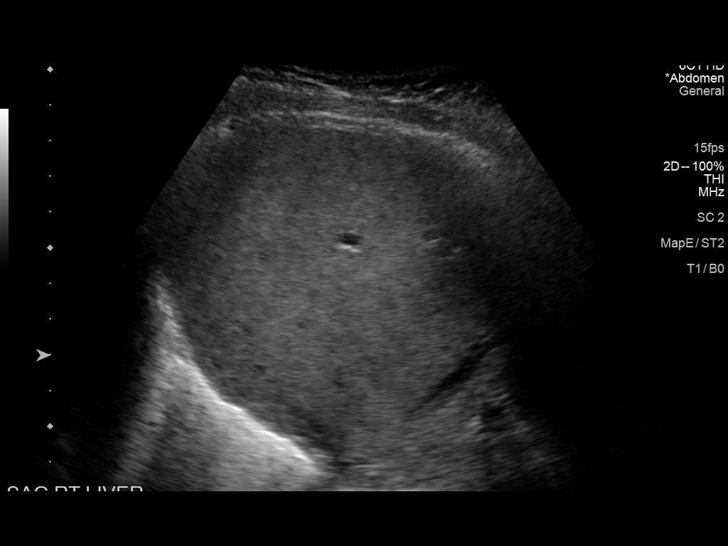
[im 35/53]
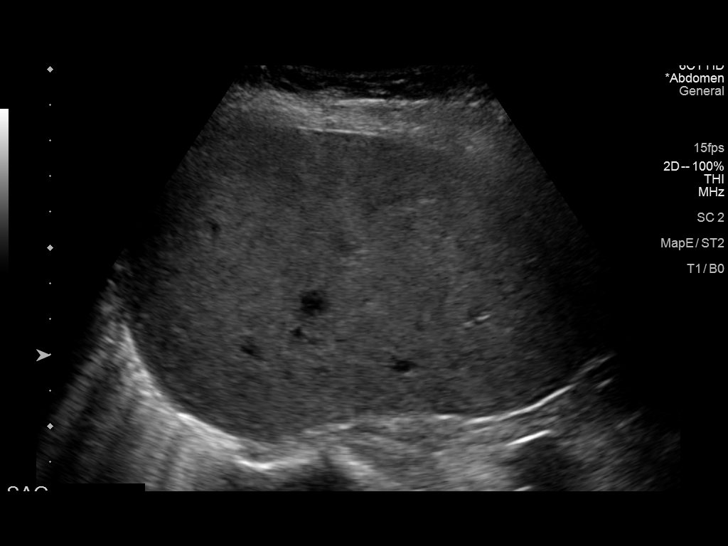
[im 40/53]
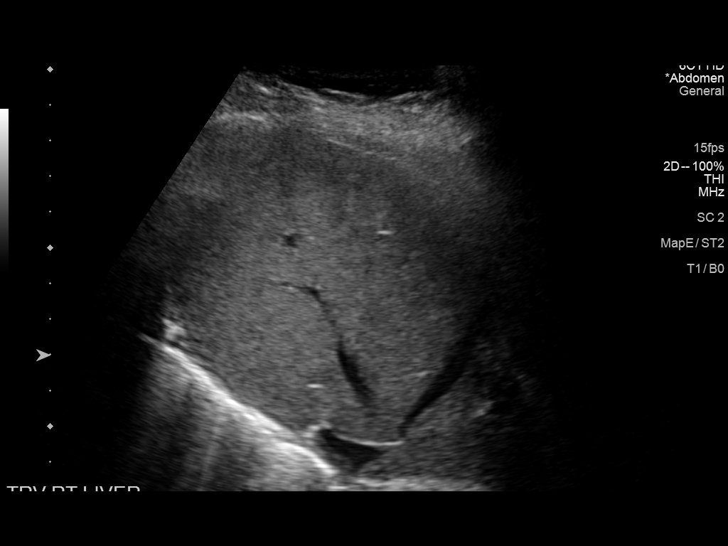
[im 44/53]
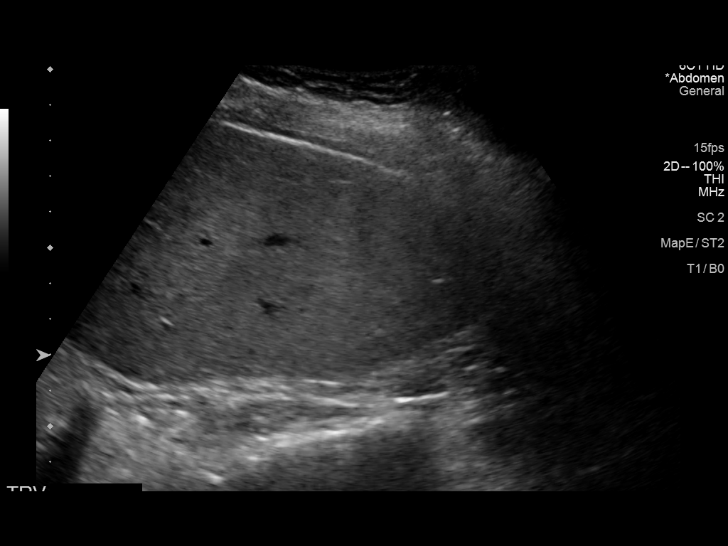
[im 48/53]
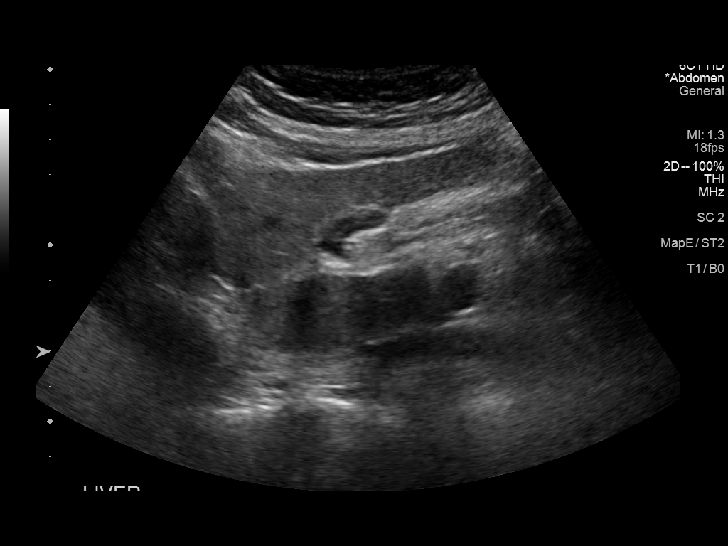
[im 53/53]
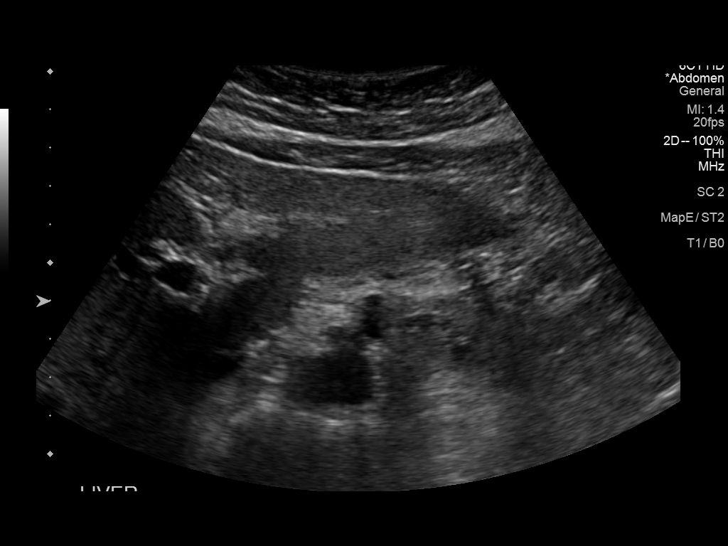

[14 of 25 positions shown; findings below may reference images not displayed]

FINDINGS: Gallbladder:

No gallstones or wall thickening visualized. No sonographic Murphy
sign noted by sonographer.

Common bile duct:

Diameter: Normal caliber, 2 mm

Liver:

6 mm echogenic focus posteriorly in the right hepatic lobe, likely
small hemangioma. Heterogeneous, increased echotexture throughout
the liver suggesting fatty infiltration. No suspicious focal hepatic
abnormality. Portal vein is patent on color Doppler imaging with
normal direction of blood flow towards the liver.

Other: None.
IMPRESSION: Hepatic steatosis.

No acute findings.

## 2021-09-24 ENCOUNTER — Ambulatory Visit: Payer: Medicare Other

## 2021-09-27 ENCOUNTER — Ambulatory Visit (INDEPENDENT_AMBULATORY_CARE_PROVIDER_SITE_OTHER): Payer: Medicare Other

## 2021-09-27 DIAGNOSIS — Z23 Encounter for immunization: Secondary | ICD-10-CM | POA: Diagnosis not present

## 2021-10-06 ENCOUNTER — Other Ambulatory Visit: Payer: Self-pay | Admitting: Cardiovascular Disease

## 2021-10-06 DIAGNOSIS — I10 Essential (primary) hypertension: Secondary | ICD-10-CM

## 2021-10-07 NOTE — Telephone Encounter (Signed)
Rx(s) sent to pharmacy electronically.  

## 2021-10-18 ENCOUNTER — Ambulatory Visit (INDEPENDENT_AMBULATORY_CARE_PROVIDER_SITE_OTHER): Payer: Medicare Other

## 2021-10-18 VITALS — BP 120/60 | HR 84 | Temp 98.2°F | Ht 64.5 in | Wt 150.6 lb

## 2021-10-18 DIAGNOSIS — Z0000 Encounter for general adult medical examination without abnormal findings: Secondary | ICD-10-CM

## 2021-10-18 NOTE — Progress Notes (Signed)
Subjective:   Christy Moore is a 76 y.o. female who presents for Medicare Annual (Subsequent) preventive examination.  Review of Systems     Cardiac Risk Factors include: advanced age (>45mn, >>75women);hypertension     Objective:    Today's Vitals   10/18/21 1006  BP: 120/60  Pulse: 84  Temp: 98.2 F (36.8 C)  TempSrc: Oral  SpO2: 96%  Weight: 150 lb 9.6 oz (68.3 kg)  Height: 5' 4.5" (1.638 m)   Body mass index is 25.45 kg/m.     10/18/2021   10:33 AM 10/17/2020    3:49 PM 08/13/2020    7:13 AM 10/17/2019    2:50 PM 12/11/2018    1:17 PM 10/03/2018   10:12 PM 05/27/2017    9:30 AM  Advanced Directives  Does Patient Have a Medical Advance Directive? Yes Yes Yes Yes No No No  Type of AParamedicof AHuntleighLiving will HOjaiLiving will HEldertonLiving will HStallingsLiving will     Does patient want to make changes to medical advance directive?   No - Patient declined No - Patient declined     Copy of [place]in Chart? No - copy requested No - copy requested No - copy requested No - copy requested     Would patient like information on creating a medical advance directive?      No - Patient declined     Current Medications (verified) Outpatient Encounter Medications as of 10/18/2021  Medication Sig   amLODipine (NORVASC) 5 MG tablet TAKE 1 TABLET (5 MG TOTAL) DAILY. PLEASE CALL TO SCHEDULE OCTOBER APPT WITH DR. [place] THANK YOU!   benzonatate (TESSALON PERLES) 100 MG capsule Take 1 capsule (100 mg total) by mouth 3 (three) times daily as needed for cough. (Patient not taking: Reported on 05/30/2021)   famotidine (PEPCID) 20 MG tablet Take 1 tablet (20 mg total) by mouth every morning.   famotidine (PEPCID) 40 MG tablet Take 1 tablet (40 mg total) by mouth at bedtime.   fluticasone (FLONASE) 50 MCG/ACT nasal spray Place 2 sprays into both nostrils daily as needed  for allergies.   levothyroxine (SYNTHROID) 100 MCG tablet TAKE 1 TABLET BY MOUTH EVERY DAY   meloxicam (MOBIC) 7.5 MG tablet Take 7.5 mg by mouth daily.   ondansetron (ZOFRAN) 8 MG tablet Take 1 tablet (8 mg total) by mouth every 6 (six) hours as needed for nausea or vomiting.   Polyethyl Glycol-Propyl Glycol (SYSTANE) 0.4-0.3 % SOLN Apply 1 drop to eye.   sodium chloride (OCEAN) 0.65 % nasal spray Place 1 spray into the nose as needed for congestion.   No facility-administered encounter medications on file as of 10/18/2021.    Allergies (verified) Amoxicillin-pot clavulanate, Azithromycin, Celecoxib, Ciprofloxacin, Doxycycline, Erythromycin, Hydrocodone, Hydrocodone-acetaminophen, Levofloxacin, Omeprazole, Penicillins, Propoxyphene n-acetaminophen, Rofecoxib, Sulfa antibiotics, and Tetanus toxoid   History: Past Medical History:  Diagnosis Date   Arthritis    Cardiomegaly    Depression    GERD (gastroesophageal reflux disease)    Hypertension    Hypothyroidism    Seizures (HCC)    Sinus tachycardia    Sjogren's syndrome (HCC)    Status post dilation of esophageal narrowing 2012   Dr. MEarlean Shawl  Past Surgical History:  Procedure Laterality Date   HAND TENDON SURGERY  02/09/2007   tendon transfer-Dr. GAmedeo Plenty  NASAL SEPTUM SURGERY     operated on by Dr. CErnesto Rutherford20  years ago   OVARIAN CYST REMOVAL     Right Sided Salpingo-oopherectomy   SALPINGOOPHORECTOMY     WISDOM TOOTH EXTRACTION     Family History  Problem Relation Age of Onset   Cancer Mother        breast and lung   Stroke Mother 36   Hypothyroidism Mother    Cancer Father        prostate   Heart attack Father 14   Alcohol abuse Maternal Grandmother    Alcohol abuse Maternal Grandfather    Allergies Other        Grandmother-pt states she takes after grandmother who had multiple allergies   Colon cancer Neg Hx    Esophageal cancer Neg Hx    Stomach cancer Neg Hx    Rectal cancer Neg Hx    Social History    Socioeconomic History   Marital status: Married    Spouse name: Not on file   Number of children: Not on file   Years of education: Not on file   Highest education level: Not on file  Occupational History   Not on file  Tobacco Use   Smoking status: Never   Smokeless tobacco: Never  Vaping Use   Vaping Use: Never used  Substance and Sexual Activity   Alcohol use: Yes    Comment: occasional   Drug use: No   Sexual activity: Not on file  Other Topics Concern   Not on file  Social History Narrative   Not on file   Social Determinants of Health   Financial Resource Strain: Low Risk  (10/18/2021)   Overall Financial Resource Strain (CARDIA)    Difficulty of Paying Living Expenses: Not hard at all  Food Insecurity: No Food Insecurity (10/18/2021)   Hunger Vital Sign    Worried About Running Out of Food in the Last Year: Never true    Ran Out of Food in the Last Year: Never true  Transportation Needs: No Transportation Needs (10/18/2021)   PRAPARE - Hydrologist (Medical): No    Lack of Transportation (Non-Medical): No  Physical Activity: Inactive (10/18/2021)   Exercise Vital Sign    Days of Exercise per Week: 0 days    Minutes of Exercise per Session: 0 min  Stress: Stress Concern Present (10/18/2021)   [place]    Feeling of Stress : To some extent  Social Connections: Socially Integrated (10/18/2021)   Social Connection and Isolation Panel [NHANES]    Frequency of Communication with Friends and Family: More than three times a week    Frequency of Social Gatherings with Friends and Family: More than three times a week    Attends Religious Services: More than 4 times per year    Active Member of [employer] or Organizations: Yes    Attends Music therapist: More than 4 times per year    Marital Status: Married    Tobacco Counseling Counseling given: Not  Answered   Clinical Intake:  Pre-visit preparation completed: No  Pain : No/denies pain     BMI - recorded: 25.45 Nutritional Status: BMI 25 -29 Overweight Nutritional Risks: None Diabetes: No  How often do you need to have someone help you when you read instructions, pamphlets, or other written materials from your doctor or pharmacy?: 1 - Never  Diabetic?  No  Interpreter Needed?: No  Information entered by :: Rolene Arbour LPN   Activities  of Daily Living    10/18/2021   10:31 AM  In your present state of health, do you have any difficulty performing the following activities:  Hearing? 0  Vision? 0  Difficulty concentrating or making decisions? 0  Walking or climbing stairs? 0  Dressing or bathing? 0  Doing errands, shopping? 0  Preparing Food and eating ? N  Using the Toilet? N  In the past six months, have you accidently leaked urine? N  Do you have problems with loss of bowel control? N  Managing your Medications? N  Managing your Finances? N  Housekeeping or managing your Housekeeping? N    Patient Care Team: Eulas Post, MD as PCP - General (Family Medicine) Skeet Latch, MD as PCP - Cardiology (Cardiology) Viona Gilmore, [facility] as Pharmacist (Pharmacist)  Indicate any recent Medical Services you may have received from other than [facility] providers in the past year (date may be approximate).     Assessment:   This is a routine wellness examination for Nashae.  Hearing/Vision screen Hearing Screening - Comments:: Denies hearing difficulties   Vision Screening - Comments:: Wears rx glasses - up to date with routine eye exams with  Dr Sabra Heck  Dietary issues and exercise activities discussed: Current Exercise Habits: The patient does not participate in regular exercise at present, Exercise limited by: None identified   Goals Addressed               This Visit's Progress     Patient stated (pt-stated)        I would like to get back  into my hobbies.       Depression Screen    10/18/2021   10:17 AM 05/30/2021   10:47 AM 05/07/2021    4:51 PM 12/21/2020    2:31 PM 10/17/2020    3:51 PM 10/17/2020    3:42 PM 08/01/2020    4:30 PM  PHQ 2/9 Scores  PHQ - 2 Score '2 3 2 '$ 0 0 0 0  PHQ- 9 Score '2 9 6        '$ Fall Risk    10/18/2021   10:32 AM 05/30/2021   10:47 AM 12/21/2020    2:31 PM 10/17/2020    3:50 PM 08/01/2020    4:29 PM  [place] in the past year? 0 1 0 0 0  Number falls in past yr: 0 0  0 0  Injury with Fall? 0 0  0 0  Risk for fall due to : No Fall Risks No Fall Risks     Follow up Falls prevention discussed Falls evaluation completed  Falls evaluation completed Falls evaluation completed    [place]:  Any stairs in or around the home? Yes  If so, are there any without handrails? No  Home free of loose throw rugs in walkways, pet beds, electrical cords, etc? Yes  Adequate lighting in your home to reduce risk of falls? Yes   ASSISTIVE DEVICES UTILIZED TO PREVENT FALLS:  Life alert? No  Use of a cane, walker or w/c? No  Grab bars in the bathroom? Yes  Shower chair or bench in shower? No  Elevated toilet seat or a handicapped toilet? Yes   TIMED UP AND GO:  Was the test performed? Yes .  Length of time to ambulate 10 feet: 10 sec.   Gait steady and fast without use of assistive device  Cognitive Function:  10/18/2021   10:33 AM  6CIT Screen  What Year? 0 points  What month? 0 points  What time? 0 points  Count back from 20 0 points  Months in reverse 0 points  Repeat phrase 0 points  Total Score 0 points    Immunizations Immunization History  Administered Date(s) Administered   Fluad Quad(high Dose 65+) 09/16/2018, 10/03/2019, 09/27/2021   Influenza Split 11/22/2011   Influenza, High Dose Seasonal PF 09/14/2014, 09/07/2015, 10/07/2016, 10/26/2017, 09/16/2018, 09/21/2020   Influenza,inj,Quad PF,6+ Mos 10/05/2013    Influenza,inj,quad, With Preservative 10/06/2016, 11/02/2017   Influenza-Unspecified 11/22/2011, 10/05/2013, 09/14/2014, 09/07/2015, 10/06/2016, 10/07/2016, 10/26/2017, 11/02/2017   PFIZER(Purple Top)SARS-COV-2 Vaccination 02/11/2019, 03/08/2019, 10/04/2019, 04/20/2020   Pfizer Covid-19 Vaccine Bivalent Booster 16yr & up 10/08/2020   Pneumococcal Conjugate-13 10/05/2013   Pneumococcal Polysaccharide-23 02/19/2015   Zoster Recombinat (Shingrix) 10/08/2020   Zoster, Live 01/17/2010      Flu Vaccine status: Up to date  Pneumococcal vaccine status: Up to date  Covid-19 vaccine status: Completed vaccines  Qualifies for Shingles Vaccine? Yes   Zostavax completed Yes   Shingrix Completed?: Yes  Screening Tests Health Maintenance  Topic Date Due   COVID-19 Vaccine (6 - Pfizer series) 11/03/2021 (Originally 02/08/2021)   TETANUS/TDAP  02/19/2035 (Originally 02/17/1964)   MAMMOGRAM  08/17/2022   COLONOSCOPY (Pts 45-41yrInsurance coverage will need to be confirmed)  08/14/2030   Pneumonia Vaccine 6567Years old  Completed   INFLUENZA VACCINE  Completed   DEXA SCAN  Completed   Hepatitis C Screening  Completed   HPV VACCINES  Aged Out   Zoster Vaccines- Shingrix  Discontinued    Health Maintenance  There are no preventive care reminders to display for this patient.   Colorectal cancer screening: Type of screening: Colonoscopy. Completed 08/13/20. Repeat every 10 years  Mammogram status: Completed 08/16/21. Repeat every year  Bone Density status: Completed 06/25/20. Results reflect: Bone density results: OSTEOPOROSIS. Repeat every   years.  Lung Cancer Screening: (Low Dose CT Chest recommended if Age 79109-80ears, 30 pack-year currently smoking OR have quit w/in 15years.) does not qualify.     Additional Screening:  Hepatitis C Screening: does qualify; Completed 05/10/17  Vision Screening: Recommended annual ophthalmology exams for early detection of glaucoma and other disorders of  the eye. Is the patient up to date with their annual eye exam?  Yes  Who is the provider or what is the name of the office in which the patient attends annual eye exams? Dr MiSabra Heckf pt is not established with a provider, would they like to be referred to a provider to establish care? No .   Dental Screening: Recommended annual dental exams for proper oral hygiene  Community Resource Referral / Chronic Care Management:  CRR required this visit?  No   CCM required this visit?  No      Plan:     I have personally reviewed and noted the following in the patient's chart:   Medical and social history Use of alcohol, tobacco or illicit drugs  Current medications and supplements including opioid prescriptions. Patient is not currently taking opioid prescriptions. Functional ability and status Nutritional status Physical activity Advanced directives List of other physicians Hospitalizations, surgeries, and ER visits in previous 12 months Vitals Screenings to include cognitive, depression, and falls Referrals and appointments  In addition, I have reviewed and discussed with patient certain preventive protocols, quality metrics, and best practice recommendations. A written personalized care plan for preventive services as well as general  preventive health recommendations were provided to patient.     Criselda Peaches, LPN   [id number]   Nurse Notes: Patient request f/u to discuss advised referred counseling/medication.Office appt scheduled 10/21/21 @ 1:45p

## 2021-10-18 NOTE — Patient Instructions (Addendum)
Christy Moore , Thank you for taking time to come for your Medicare Wellness Visit. I appreciate your ongoing commitment to your health goals. Please review the following plan we discussed and let me know if I can assist you in the future.   These are the goals we discussed:  Goals       Patient Stated      Celebrate 52th Anniversary!  Continue in your garden!      Patient stated (pt-stated)      I would like to get back into my hobbies.      Weight (lb) < 140 lb (63.5 kg)      Will rotate eating habits More for breakfast and lunch and less of dinner   Health snacks; scrambled egg sandwich; bowl of cereal  Scotch eggs   Start walking more around the neighborhood          This is a list of the screening recommended for you and due dates:  Health Maintenance  Topic Date Due   COVID-19 Vaccine (6 - Pfizer series) 11/03/2021*   Tetanus Vaccine  02/19/2035*   Mammogram  08/17/2022   Colon Cancer Screening  08/14/2030   Pneumonia Vaccine  Completed   Flu Shot  Completed   DEXA scan (bone density measurement)  Completed   Hepatitis C Screening: USPSTF Recommendation to screen - Ages 79-79 yo.  Completed   HPV Vaccine  Aged Out   Zoster (Shingles) Vaccine  Discontinued  *Topic was postponed. The date shown is not the original due date.    Advanced directives: Please bring a copy of your health care power of attorney and living will to the office to be added to your chart at your convenience.   Conditions/risks identified: None  Next appointment: Follow up in one year for your annual wellness visit    Preventive Care 65 Years and Older, Female Preventive care refers to lifestyle choices and visits with your health care provider that can promote health and wellness. What does preventive care include? A yearly physical exam. This is also called an annual well check. Dental exams once or twice a year. Routine eye exams. Ask your health care provider how often you should have  your eyes checked. Personal lifestyle choices, including: Daily care of your teeth and gums. Regular physical activity. Eating a healthy diet. Avoiding tobacco and drug use. Limiting alcohol use. Practicing safe sex. Taking low-dose aspirin every day. Taking vitamin and mineral supplements as recommended by your health care provider. What happens during an annual well check? The services and screenings done by your health care provider during your annual well check will depend on your age, overall health, lifestyle risk factors, and family history of disease. Counseling  Your health care provider may ask you questions about your: Alcohol use. Tobacco use. Drug use. Emotional well-being. Home and relationship well-being. Sexual activity. Eating habits. History of falls. Memory and ability to understand (cognition). Work and work Statistician. Reproductive health. Screening  You may have the following tests or measurements: Height, weight, and BMI. Blood pressure. Lipid and cholesterol levels. These may be checked every 5 years, or more frequently if you are over 56 years old. Skin check. Lung cancer screening. You may have this screening every year starting at age 70 if you have a 30-pack-year history of smoking and currently smoke or have quit within the past 15 years. Fecal occult blood test (FOBT) of the stool. You may have this test every year starting at  age 29. Flexible sigmoidoscopy or colonoscopy. You may have a sigmoidoscopy every 5 years or a colonoscopy every 10 years starting at age 32. Hepatitis C blood test. Hepatitis B blood test. Sexually transmitted disease (STD) testing. Diabetes screening. This is done by checking your blood sugar (glucose) after you have not eaten for a while (fasting). You may have this done every 1-3 years. Bone density scan. This is done to screen for osteoporosis. You may have this done starting at age 65. Mammogram. This may be done every  1-2 years. Talk to your health care provider about how often you should have regular mammograms. Talk with your health care provider about your test results, treatment options, and if necessary, the need for more tests. Vaccines  Your health care provider may recommend certain vaccines, such as: Influenza vaccine. This is recommended every year. Tetanus, diphtheria, and acellular pertussis (Tdap, Td) vaccine. You may need a Td booster every 10 years. Zoster vaccine. You may need this after age 89. Pneumococcal 13-valent conjugate (PCV13) vaccine. One dose is recommended after age 63. Pneumococcal polysaccharide (PPSV23) vaccine. One dose is recommended after age 72. Talk to your health care provider about which screenings and vaccines you need and how often you need them. This information is not intended to replace advice given to you by your health care provider. Make sure you discuss any questions you have with your health care provider. Document Released: 01/19/2015 Document Revised: 09/12/2015 Document Reviewed: 10/24/2014 Elsevier Interactive Patient Education  2017 [place] Prevention in the Home Falls can cause injuries. They can happen to people of all ages. There are many things you can do to make your home safe and to help prevent falls. What can I do on the outside of my home? Regularly fix the edges of walkways and driveways and fix any cracks. Remove anything that might make you trip as you walk through a door, such as a raised step or threshold. Trim any bushes or trees on the path to your home. Use bright outdoor lighting. Clear any walking paths of anything that might make someone trip, such as rocks or tools. Regularly check to see if handrails are loose or broken. Make sure that both sides of any steps have handrails. Any raised decks and porches should have guardrails on the edges. Have any leaves, snow, or ice cleared regularly. Use sand or salt on walking  paths during winter. Clean up any spills in your garage right away. This includes oil or grease spills. What can I do in the bathroom? Use night lights. Install grab bars by the toilet and in the tub and shower. Do not use towel bars as grab bars. Use non-skid mats or decals in the tub or shower. If you need to sit down in the shower, use a plastic, non-slip stool. Keep the floor dry. Clean up any water that spills on the floor as soon as it happens. Remove soap buildup in the tub or shower regularly. Attach bath mats securely with double-sided non-slip rug tape. Do not have throw rugs and other things on the floor that can make you trip. What can I do in the bedroom? Use night lights. Make sure that you have a light by your bed that is easy to reach. Do not use any sheets or blankets that are too big for your bed. They should not hang down onto the floor. Have a firm chair that has side arms. You can use this for support while  you get dressed. Do not have throw rugs and other things on the floor that can make you trip. What can I do in the kitchen? Clean up any spills right away. Avoid walking on wet floors. Keep items that you use a lot in easy-to-reach places. If you need to reach something above you, use a strong step stool that has a grab bar. Keep electrical cords out of the way. Do not use floor polish or wax that makes floors slippery. If you must use wax, use non-skid floor wax. Do not have throw rugs and other things on the floor that can make you trip. What can I do with my stairs? Do not leave any items on the stairs. Make sure that there are handrails on both sides of the stairs and use them. Fix handrails that are broken or loose. Make sure that handrails are as long as the stairways. Check any carpeting to make sure that it is firmly attached to the stairs. Fix any carpet that is loose or worn. Avoid having throw rugs at the top or bottom of the stairs. If you do have  throw rugs, attach them to the floor with carpet tape. Make sure that you have a light switch at the top of the stairs and the bottom of the stairs. If you do not have them, ask someone to add them for you. What else can I do to help prevent falls? Wear shoes that: Do not have high heels. Have rubber bottoms. Are comfortable and fit you well. Are closed at the toe. Do not wear sandals. If you use a stepladder: Make sure that it is fully opened. Do not climb a closed stepladder. Make sure that both sides of the stepladder are locked into place. Ask someone to hold it for you, if possible. Clearly mark and make sure that you can see: Any grab bars or handrails. First and last steps. Where the edge of each step is. Use tools that help you move around (mobility aids) if they are needed. These include: Canes. Walkers. Scooters. Crutches. Turn on the lights when you go into a dark area. Replace any light bulbs as soon as they burn out. Set up your furniture so you have a clear path. Avoid moving your furniture around. If any of your floors are uneven, fix them. If there are any pets around you, be aware of where they are. Review your medicines with your doctor. Some medicines can make you feel dizzy. This can increase your chance of falling. Ask your doctor what other things that you can do to help prevent falls. This information is not intended to replace advice given to you by your health care provider. Make sure you discuss any questions you have with your health care provider. Document Released: 10/19/2008 Document Revised: 05/31/2015 Document Reviewed: 01/27/2014 Elsevier Interactive Patient Education  2017 [employer].

## 2021-10-21 ENCOUNTER — Encounter: Payer: Self-pay | Admitting: Family Medicine

## 2021-10-21 ENCOUNTER — Ambulatory Visit (INDEPENDENT_AMBULATORY_CARE_PROVIDER_SITE_OTHER): Payer: Medicare Other | Admitting: Family Medicine

## 2021-10-21 VITALS — BP 138/84 | HR 85 | Temp 97.8°F | Ht 64.5 in | Wt 152.5 lb

## 2021-10-21 DIAGNOSIS — F321 Major depressive disorder, single episode, moderate: Secondary | ICD-10-CM

## 2021-10-21 MED ORDER — ESCITALOPRAM OXALATE 5 MG PO TABS: 5.0000 mg | ORAL_TABLET | Freq: Every day | ORAL | 5 refills | Status: DC

## 2021-10-21 NOTE — Progress Notes (Signed)
Established Patient Office Visit  Subjective   Patient ID: Christy Moore, female    DOB: 24-Jan-1945  Age: 76 y.o. MRN: [id number]  Chief Complaint  Patient presents with   Depression    X1 month    Insomnia    HPI   Seen for depression evaluation.  She states she has had some loss of interest in hobbies and does not find much joy in activities really for several months.  Her husband had shoulder surgery back August 8 and she has been required to shoulder more responsibilities at home.  She does have some sleep difficulties.  Usually has several hobbies including woodworking and gardening but has been much less engaged in these recently.  She sometimes dwells on things she has not accomplished in life.    For example, she had wanted to be an Arboriculturist.  She also mentions that they have 2 sons and they are both gay and so she is not anticipating any grandchildren.  She states that she and her husband have lived in the same house for 50 years and she frequently feels like she is in a "rut".  No suicidal ideation.  No prior history of treatment for depression.  She does have hypothyroidism on levothyroxine and most recent TSH was normal.  Past Medical History:  Diagnosis Date   Arthritis    Cardiomegaly    Depression    GERD (gastroesophageal reflux disease)    Hypertension    Hypothyroidism    Seizures (HCC)    Sinus tachycardia    Sjogren's syndrome (HCC)    Status post dilation of esophageal narrowing 2012   Dr. Earlean Shawl   Past Surgical History:  Procedure Laterality Date   HAND TENDON SURGERY  02/09/2007   tendon transfer-Dr. Amedeo Plenty   NASAL SEPTUM SURGERY     operated on by Dr. Ernesto Rutherford 20 years ago   OVARIAN CYST REMOVAL     Right Sided Salpingo-oopherectomy   SALPINGOOPHORECTOMY     WISDOM TOOTH EXTRACTION      reports that she has never smoked. She has never used smokeless tobacco. She reports current alcohol use. She reports that she does not use drugs. family  history includes Alcohol abuse in her maternal grandfather and maternal grandmother; Allergies in an other family member; Cancer in her father and mother; Heart attack (age of onset: 14) in her father; Hypothyroidism in her mother; Stroke (age of onset: 47) in her mother. Allergies  Allergen Reactions   Amoxicillin-Pot Clavulanate     REACTION: nausea   Azithromycin     REACTION: nausea, Gi   Celecoxib     REACTION: nausea   Ciprofloxacin     REACTION: nausea   Doxycycline     REACTION: gi upset   Erythromycin     REACTION: nause, GI   Hydrocodone     REACTION: nausea   Hydrocodone-Acetaminophen     REACTION: GI upset   Levofloxacin     REACTION: she claims renal failiure   Omeprazole Nausea And Vomiting   Penicillins    Propoxyphene N-Acetaminophen    Rofecoxib     REACTION: nausea   Sulfa Antibiotics    Tetanus Toxoid     Review of Systems  Constitutional:  Negative for malaise/fatigue.  Eyes:  Negative for blurred vision.  Respiratory:  Negative for shortness of breath.   Cardiovascular:  Negative for chest pain.  Neurological:  Negative for dizziness, weakness and headaches.  Psychiatric/Behavioral:  Positive for depression. Negative for  memory loss and suicidal ideas.       Objective:     BP 138/84 (BP Location: Left Arm, Patient Position: Sitting, Cuff Size: Normal)   Pulse 85   Temp 97.8 F (36.6 C) (Oral)   Ht 5' 4.5" (1.638 m)   Wt 152 lb 8 oz (69.2 kg)   SpO2 99%   BMI 25.77 kg/m    Physical Exam Vitals reviewed.  Constitutional:      Appearance: Normal appearance.  Cardiovascular:     Rate and Rhythm: Normal rate and regular rhythm.  Pulmonary:     Effort: Pulmonary effort is normal.     Breath sounds: Normal breath sounds.  Neurological:     General: No focal deficit present.     Mental Status: She is alert.  Psychiatric:     Comments: PHQ-9 equals 10      No results found for any visits on 10/21/21.    The ASCVD Risk score  (Arnett DK, et al., 2019) failed to calculate for the following reasons:   Cannot find a previous HDL lab   Cannot find a previous total cholesterol lab    Assessment & Plan:   Several week if not month history of progressive depression symptoms.  Even though her PHQ 9 score was relatively low at 10 she has had tremendous anhedonia and would like to consider trial of antidepressant.  She had counseling in the past but did not see much benefit.  We agreed to trial Lexapro starting 5 mg daily and reassess 1 month.  Consider further titration at that point if indicated  No follow-ups on file.    [name], MD

## 2021-11-04 ENCOUNTER — Telehealth (HOSPITAL_BASED_OUTPATIENT_CLINIC_OR_DEPARTMENT_OTHER): Payer: Self-pay

## 2021-11-04 DIAGNOSIS — I10 Essential (primary) hypertension: Secondary | ICD-10-CM

## 2021-11-04 MED ORDER — AMLODIPINE BESYLATE 5 MG PO TABS: 5.0000 mg | ORAL_TABLET | Freq: Every day | ORAL | 0 refills | Status: DC

## 2021-11-04 NOTE — Telephone Encounter (Signed)
Received fax from [facility] requesting refills for Amlodipine 5 mg. Sent 15 tab with 0 refills. Pt over due for appointment. Second attempt.  Routing to [facility] scheduling to schedule appointment with Dr. Oval Linsey or APP for further refills.

## 2021-11-05 ENCOUNTER — Other Ambulatory Visit: Payer: Self-pay | Admitting: Cardiovascular Disease

## 2021-11-05 DIAGNOSIS — I10 Essential (primary) hypertension: Secondary | ICD-10-CM

## 2021-11-06 ENCOUNTER — Telehealth (HOSPITAL_BASED_OUTPATIENT_CLINIC_OR_DEPARTMENT_OTHER): Payer: Self-pay | Admitting: Cardiovascular Disease

## 2021-11-06 NOTE — Telephone Encounter (Signed)
Left message for patient to call and schedule follow up visit with Dr. Oval Linsey or Laurann Montana, NP to refill medications

## 2021-11-08 ENCOUNTER — Ambulatory Visit (INDEPENDENT_AMBULATORY_CARE_PROVIDER_SITE_OTHER): Payer: Medicare Other | Admitting: Family

## 2021-11-08 ENCOUNTER — Encounter (HOSPITAL_BASED_OUTPATIENT_CLINIC_OR_DEPARTMENT_OTHER): Payer: Self-pay | Admitting: Family

## 2021-11-08 VITALS — BP 118/66 | HR 71 | Ht 64.5 in | Wt 146.8 lb

## 2021-11-08 DIAGNOSIS — I7300 Raynaud's syndrome without gangrene: Secondary | ICD-10-CM

## 2021-11-08 DIAGNOSIS — I10 Essential (primary) hypertension: Secondary | ICD-10-CM

## 2021-11-08 DIAGNOSIS — I452 Bifascicular block: Secondary | ICD-10-CM

## 2021-11-08 MED ORDER — AMLODIPINE BESYLATE 5 MG PO TABS: 5.0000 mg | ORAL_TABLET | Freq: Every day | ORAL | 3 refills | Status: DC

## 2021-11-08 NOTE — Patient Instructions (Addendum)
Medication Instructions:  Continue your current medications.   *If you need a refill on your cardiac medications before your next appointment, please call your pharmacy*   Lab Work/Testing/Procedures: None ordered today.   Follow-Up: At [facility], you and your health needs are our priority.  As part of our continuing mission to provide you with exceptional heart care, we have created designated Provider Care Teams.  These Care Teams include your primary Cardiologist (physician) and Advanced Practice Providers (APPs -  Physician Assistants and Nurse Practitioners) who all work together to provide you with the care you need, when you need it.  We recommend signing up for the patient portal called "MyChart".  Sign up information is provided on this After Visit Summary.  MyChart is used to connect with patients for Virtual Visits (Telemedicine).  Patients are able to view lab/test results, encounter notes, upcoming appointments, etc.  Non-urgent messages can be sent to your provider as well.   To learn more about what you can do with MyChart, go to [email or web address].    Your next appointment:   1 year(s)  The format for your next appointment:   In Person  Provider:   Skeet Latch, MD or Laurann Montana, NP    Other Instructions   To prevent palpitations: Make sure you are adequately hydrated.  Avoid and/or limit caffeine containing beverages like soda or tea. Exercise regularly.  Manage stress well. Some over the counter medications can cause palpitations such as Benadryl, AdvilPM, TylenolPM. Regular Advil or Tylenol do not cause palpitations.    Heart Healthy Diet Recommendations: A low-salt diet is recommended. Meats should be grilled, baked, or boiled. Avoid fried foods. Focus on lean protein sources like fish or chicken with vegetables and fruits. The American Heart Association is a Microbiologist!  American Heart Association Diet and Lifeystyle Recommendations    Exercise recommendations: The American Heart Association recommends 150 minutes of moderate intensity exercise weekly. Try 30 minutes of moderate intensity exercise 4-5 times per week. This could include walking, jogging, or swimming.

## 2021-11-08 NOTE — Progress Notes (Unsigned)
Office Visit    Patient Name: Christy Moore Date of Encounter: 11/08/2021  PCP:  Eulas Post, MD   [place]  Cardiologist:  Skeet Latch, MD  Advanced Practice Provider:  No care team member to display Electrophysiologist:  None     Chief Complaint    Christy Moore is a 76 y.o. female presents today for annual cardiology follow up.    Past Medical History    Past Medical History:  Diagnosis Date   Arthritis    Cardiomegaly    Depression    GERD (gastroesophageal reflux disease)    Hypertension    Hypothyroidism    Seizures (HCC)    Sinus tachycardia    Sjogren's syndrome ([place])    Status post dilation of esophageal narrowing 2012   Dr. Earlean Shawl   Past Surgical History:  Procedure Laterality Date   HAND TENDON SURGERY  02/09/2007   tendon transfer-Dr. Amedeo Plenty   NASAL SEPTUM SURGERY     operated on by Dr. Ernesto Rutherford 20 years ago   OVARIAN CYST REMOVAL     Right Sided Salpingo-oopherectomy   SALPINGOOPHORECTOMY     WISDOM TOOTH EXTRACTION      Allergies  Allergies  Allergen Reactions   Amoxicillin-Pot Clavulanate     REACTION: nausea   Azithromycin     REACTION: nausea, Gi   Celecoxib     REACTION: nausea   Ciprofloxacin     REACTION: nausea   Doxycycline     REACTION: gi upset   Erythromycin     REACTION: nause, GI   Hydrocodone     REACTION: nausea   Hydrocodone-Acetaminophen     REACTION: GI upset   Levofloxacin     REACTION: she claims renal failiure   Omeprazole Nausea And Vomiting   Penicillins    Propoxyphene N-Acetaminophen    Rofecoxib     REACTION: nausea   Sulfa Antibiotics    Tetanus Toxoid     History of Present Illness    Christy Moore is a 76 y.o. female with a hx of Sjorgren's disease, HTN, GERD, Raynaud's, bifascicular heart block last seen 10/23/20 by Dr. Oval Linsey.  Prior nuclear stress test 2014 LVEF 60% and no ischemia. Repeat echo 05/2020 LVEF 50-55%, gr1DD. 06/2020 Amlodipine  increased due to elevated BP and Raynaud's.Last seen 10/23/20 by Dr. Oval Linsey doing well from a cardiac perspective and she was recommended to follow up in one year.   She presents today for follow up independently. She enjoys going antiquing in her spare time. She is walking with her husband and her dog for exercise 1-2 miles per day. Feels much better since she started walking. Reports no shortness of breath nor dyspnea on exertion. Reports no chest pain, pressure, or tightness. No edema, orthopnea, PND. Reports occasional palpitations that are not bothersome.    EKGs/Labs/Other Studies Reviewed:   The following studies were reviewed today: Echo 05/22/2020 1. Left ventricular ejection fraction, by estimation, is 50 to 55%. The  left ventricle has low normal function. The left ventricle has no regional  wall motion abnormalities. Left ventricular diastolic parameters are  consistent with Grade I diastolic  dysfunction (impaired relaxation). Elevated left atrial pressure. The  average left ventricular global longitudinal strain is -22.3 %. The global  longitudinal strain is normal.   2. Right ventricular systolic function is normal. The right ventricular  size is normal. Tricuspid regurgitation signal is inadequate for assessing  PA pressure.   3. The  mitral valve is normal in structure. Mild mitral valve  regurgitation. No evidence of mitral stenosis.   4. The aortic valve is tricuspid. Aortic valve regurgitation is not  visualized. Mild aortic valve sclerosis is present, with no evidence of  aortic valve stenosis.   5. The inferior vena cava is normal in size with greater than 50%  respiratory variability, suggesting right atrial pressure of 3 mmHg.   EKG:  EKG is ordered today.  The ekg ordered today demonstrates NSR 71 bpm with stable bifascicular block (RBBB/LAFB). STable LVH, No acute ST/T wave changes.   Recent Labs: 06/27/2021: TSH 0.852  Recent Lipid Panel    Component Value  Date/Time   CHOL 163 02/14/2015 0936   TRIG 106.0 02/14/2015 0936   HDL 52.70 02/14/2015 0936   CHOLHDL 3 02/14/2015 0936   VLDL 21.2 02/14/2015 0936   LDLCALC 89 02/14/2015 0936     Home Medications   Current Meds  Medication Sig   amLODipine (NORVASC) 5 MG tablet Take 1 tablet (5 mg total) by mouth daily. Please schedule appointment with Dr. Oval Linsey for refills. 2nd attempt   benzonatate (TESSALON PERLES) 100 MG capsule Take 1 capsule (100 mg total) by mouth 3 (three) times daily as needed for cough.   escitalopram (LEXAPRO) 5 MG tablet Take 1 tablet (5 mg total) by mouth daily.   famotidine (PEPCID) 20 MG tablet Take 1 tablet (20 mg total) by mouth every morning. (Patient taking differently: Take 20 mg by mouth every morning. sometimes)   famotidine (PEPCID) 40 MG tablet Take 1 tablet (40 mg total) by mouth at bedtime.   fluticasone (FLONASE) 50 MCG/ACT nasal spray Place 2 sprays into both nostrils daily as needed for allergies.   levothyroxine (SYNTHROID) 100 MCG tablet TAKE 1 TABLET BY MOUTH EVERY DAY   meloxicam (MOBIC) 7.5 MG tablet Take 7.5 mg by mouth daily.   ondansetron (ZOFRAN) 8 MG tablet Take 1 tablet (8 mg total) by mouth every 6 (six) hours as needed for nausea or vomiting.   Polyethyl Glycol-Propyl Glycol (SYSTANE) 0.4-0.3 % SOLN Apply 1 drop to eye.   sodium chloride (OCEAN) 0.65 % nasal spray Place 1 spray into the nose as needed for congestion.     Review of Systems    All other systems reviewed and are otherwise negative except as noted above.  Physical Exam    VS:  BP 118/66   Pulse 71   Ht 5' 4.5" (1.638 m)   Wt 146 lb 12.8 oz (66.6 kg)   BMI 24.81 kg/m  , BMI Body mass index is 24.81 kg/m.  Wt Readings from Last 3 Encounters:  11/08/21 146 lb 12.8 oz (66.6 kg)  10/21/21 152 lb 8 oz (69.2 kg)  10/18/21 150 lb 9.6 oz (68.3 kg)     GEN: Well nourished, well developed, in no acute distress. HEENT: normal. Neck: Supple, no JVD, carotid bruits, or  masses. Cardiac: RRR, no murmurs, rubs, or gallops. No clubbing, cyanosis, edema.  Radials/PT 2+ and equal bilaterally.  Respiratory:  Respirations regular and unlabored, clear to auscultation bilaterally. GI: Soft, nontender, nondistended. MS: No deformity or atrophy. Skin: Warm and dry, no rash. Neuro:  Strength and sensation are intact. Psych: Normal affect.  Assessment & Plan    HTN - BP well controlled. Continue current antihypertensive regimen.    Raynaud's - Improved since Amlodipine. Continue to follow with PCP.   Palpitations - Fleeding palpitations that are not bothersome. No indication for further workup at  this time. Could consider ZIO in future if needed.   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 Avoid AV nodal blocking agents. She was educated ot report syncope and reports no lightheadedness, dizziness, syncope in clinic today.          Disposition: Follow up in 1 year(s) with Skeet Latch, MD or APP.  Signed, Loel Dubonnet, NP 11/08/2021, 11:20 AM [place]

## 2021-11-11 ENCOUNTER — Other Ambulatory Visit: Payer: Self-pay | Admitting: Physician Assistant

## 2021-11-11 DIAGNOSIS — H903 Sensorineural hearing loss, bilateral: Secondary | ICD-10-CM | POA: Diagnosis not present

## 2021-11-14 ENCOUNTER — Other Ambulatory Visit: Payer: Self-pay | Admitting: Family Medicine

## 2021-11-20 ENCOUNTER — Ambulatory Visit (INDEPENDENT_AMBULATORY_CARE_PROVIDER_SITE_OTHER): Payer: Medicare Other | Admitting: Family Medicine

## 2021-11-20 VITALS — BP 122/78 | HR 73 | Temp 98.6°F | Resp 96 | Wt 149.0 lb

## 2021-11-20 DIAGNOSIS — F324 Major depressive disorder, single episode, in partial remission: Secondary | ICD-10-CM | POA: Diagnosis not present

## 2021-11-20 NOTE — Progress Notes (Signed)
Established Patient Office Visit  Subjective   Patient ID: Christy Moore, female    DOB: 1945/06/02  Age: 76 y.o. MRN: [id number]  Chief Complaint  Patient presents with   Follow-up    1 month f/u for depression    HPI   Malikah is here for follow-up regarding depression.  Refer to last note for details.  Her PHQ-9 score is only 10 but she had multiple symptoms of depression.  We ended up starting low-dose Lexapro 5 mg daily for anxiety and depression symptoms.  She states that she has made a "180 degree turn ".  She is very pleased thus far.  She is feeling less anxious.  She has had more motivation to get out and walk.  Currently walking usually couple miles per day.  Has been getting about 6000 steps per day.  Has made some positive changes lost 3 pounds.  Sleeping 7 to 8 hours at night and was sleeping about 6 hours.  Still has some anhedonia but overall much improved.  No suicidal ideation.  Past Medical History:  Diagnosis Date   Arthritis    Cardiomegaly    Depression    GERD (gastroesophageal reflux disease)    Hypertension    Hypothyroidism    Seizures (HCC)    Sinus tachycardia    Sjogren's syndrome ([place])    Status post dilation of esophageal narrowing 2012   Dr. Earlean Shawl   Past Surgical History:  Procedure Laterality Date   HAND TENDON SURGERY  02/09/2007   tendon transfer-Dr. Amedeo Plenty   NASAL SEPTUM SURGERY     operated on by Dr. Ernesto Rutherford 20 years ago   OVARIAN CYST REMOVAL     Right Sided Salpingo-oopherectomy   SALPINGOOPHORECTOMY     WISDOM TOOTH EXTRACTION      reports that she has never smoked. She has never used smokeless tobacco. She reports current alcohol use. She reports that she does not use drugs. family history includes Alcohol abuse in her maternal grandfather and maternal grandmother; Allergies in an other family member; Cancer in her father and mother; Heart attack (age of onset: 59) in her father; Hypothyroidism in her mother; Stroke (age of  onset: 51) in her mother. Allergies  Allergen Reactions   Amoxicillin-Pot Clavulanate     REACTION: nausea   Azithromycin     REACTION: nausea, Gi   Celecoxib     REACTION: nausea   Ciprofloxacin     REACTION: nausea   Doxycycline     REACTION: gi upset   Erythromycin     REACTION: nause, GI   Hydrocodone     REACTION: nausea   Hydrocodone-Acetaminophen     REACTION: GI upset   Levofloxacin     REACTION: she claims renal failiure   Omeprazole Nausea And Vomiting   Penicillins    Propoxyphene N-Acetaminophen    Rofecoxib     REACTION: nausea   Sulfa Antibiotics    Tetanus Toxoid     Review of Systems  Constitutional:  Negative for malaise/fatigue.  Gastrointestinal:  Negative for nausea and vomiting.  Psychiatric/Behavioral:  Negative for suicidal ideas. The patient is not nervous/anxious and does not have insomnia.       Objective:     BP 122/78 (BP Location: Right Arm, Patient Position: Sitting, Cuff Size: Normal)   Pulse 73   Temp 98.6 F (37 C) (Oral)   Resp (!) 96   Wt 149 lb (67.6 kg)   BMI 25.18 kg/m  Physical Exam Vitals reviewed.  Constitutional:      Appearance: Normal appearance.  Cardiovascular:     Rate and Rhythm: Normal rate and regular rhythm.  Pulmonary:     Effort: Pulmonary effort is normal.     Breath sounds: Normal breath sounds. No wheezing or rales.  Neurological:     Mental Status: She is alert.  Psychiatric:        Mood and Affect: Mood normal.        Behavior: Behavior normal.        Thought Content: Thought content normal.        Judgment: Judgment normal.      No results found for any visits on 11/20/21.    The ASCVD Risk score (Arnett DK, et al., 2019) failed to calculate for the following reasons:   Cannot find a previous HDL lab   Cannot find a previous total cholesterol lab    Assessment & Plan:   Major depressive episode.  Much improved on low-dose Lexapro 5 mg daily.  She does not feel need to titrate  at this time.  She is having increased motivation toward healthy lifestyle behaviors, sleeping better, and much less anxiety.  She is overall very pleased.  No side effects from medication.  Continue current dosage.  Also discussed nonpharmacologic factors such as increased exercise.  We did notify her that we could easily titrate up to 10 mg if she sees necessary down the road.  She will observe for now.  [name], MD

## 2021-12-04 ENCOUNTER — Other Ambulatory Visit: Payer: Medicare Other

## 2021-12-06 ENCOUNTER — Ambulatory Visit
Admission: RE | Admit: 2021-12-06 | Discharge: 2021-12-06 | Disposition: A | Discharge status: Home / Self Care | Payer: Medicare Other | Source: Ambulatory Visit | Attending: Physician Assistant | Admitting: Physician Assistant

## 2021-12-06 DIAGNOSIS — H903 Sensorineural hearing loss, bilateral: Secondary | ICD-10-CM

## 2021-12-06 MED ORDER — GADOPICLENOL 0.5 MMOL/ML IV SOLN
7.0000 mL | Freq: Once | INTRAVENOUS | Status: AC | PRN
  Administered 2021-12-06: 7 mL via INTRAVENOUS

## 2021-12-13 ENCOUNTER — Telehealth: Payer: Self-pay | Admitting: Pharmacist

## 2021-12-13 NOTE — Chronic Care Management (AMB) (Signed)
    Chronic Care Management Pharmacy Assistant   Name: Christy Moore  MRN: [id number] DOB: [birth year]  12/13/21 APPOINTMENT REMINDER   Patient was reminded to have all medications, supplements and any blood glucose and blood pressure readings available for review with Jeni Salles, Pharm. D, for telephone visit on 12/16/21 at 1.    Care Gaps: TDAP - Overdue COVID Booster - Overdue AWV- 10/18/21   Star Rating Drug: none   Medications: Outpatient Encounter Medications as of 12/13/2021  Medication Sig   amLODipine (NORVASC) 5 MG tablet Take 1 tablet (5 mg total) by mouth daily.   escitalopram (LEXAPRO) 5 MG tablet TAKE 1 TABLET (5 MG TOTAL) BY MOUTH DAILY.   famotidine (PEPCID) 40 MG tablet Take 1 tablet (40 mg total) by mouth at bedtime.   fluticasone (FLONASE) 50 MCG/ACT nasal spray Place 2 sprays into both nostrils daily as needed for allergies. (Patient not taking: Reported on 11/20/2021)   levothyroxine (SYNTHROID) 100 MCG tablet TAKE 1 TABLET BY MOUTH EVERY DAY   meloxicam (MOBIC) 7.5 MG tablet Take 7.5 mg by mouth daily. (Patient not taking: Reported on 11/20/2021)   Polyethyl Glycol-Propyl Glycol (SYSTANE) 0.4-0.3 % SOLN Apply 1 drop to eye.   sodium chloride (OCEAN) 0.65 % nasal spray Place 1 spray into the nose as needed for congestion.   No facility-administered encounter medications on file as of 12/13/2021.       [facility] Clinical Pharmacist Assistant [phone]

## 2021-12-16 ENCOUNTER — Ambulatory Visit (INDEPENDENT_AMBULATORY_CARE_PROVIDER_SITE_OTHER): Payer: Medicare Other | Admitting: Pharmacist

## 2021-12-16 DIAGNOSIS — F324 Major depressive disorder, single episode, in partial remission: Secondary | ICD-10-CM

## 2021-12-16 DIAGNOSIS — I10 Essential (primary) hypertension: Secondary | ICD-10-CM

## 2021-12-16 NOTE — Patient Instructions (Addendum)
Hi Christy Moore,  It was great to catch up again! Please work on checking your blood pressure regularly at home like we discussed.   Also, you can feel free to move your escitalopram to take with your morning medications to avoid missing any doses like we discussed.  Please reach out to me if you have any questions or need anything before our follow up!  Best, Maddie  Jeni Salles, PharmD, [place] Pharmacist [place] [facility] [place]

## 2021-12-16 NOTE — Progress Notes (Signed)
Chronic Care Management Pharmacy Note  12/16/2021 Name:  Christy Moore MRN:  [id number] DOB:  07-15-1945  Summary: Pt is not checking BP at home Pt admits to missing some doses of escitalopram  Recommendations/Changes made from today's visit: -Recommended repeat vitamin D level -Recommended BP monitoring at home at least once weekly -Recommended moving escitalopram to take with morning medications to avoid missed doses  Plan: BP assessment in 3-4 months  Subjective: Christy Moore is an 76 y.o. year old female who is a primary patient of Burchette, Alinda Sierras, MD.  The CCM team was consulted for assistance with disease management and care coordination needs.    Engaged with patient by telephone for follow up visit in response to provider referral for pharmacy case management and/or care coordination services.   Consent to Services:  The patient was given information about Chronic Care Management services, agreed to services, and gave verbal consent prior to initiation of services.  Please see initial visit note for detailed documentation.   Patient Care Team: Eulas Post, MD as PCP - General (Family Medicine) Skeet Latch, MD as PCP - Cardiology (Cardiology) Viona Gilmore, [facility] as Pharmacist (Pharmacist)  Recent office visits: 11/20/21 [name], MD: Patient presented for depression follow up. No medication changes.  10/21/21 [name], MD: Patient presented for depression and insomnia. Prescribed escitalopram 5 mg daily and follow up in 1 month.   10/18/21 Rolene Arbour, LPN: Patient presented for AWV.  Recent consult visits: 11/11/21 Pietro Cassis, PA-C (ENT): Patient presented for asymmetrical hearing loss evaluation.  11/08/21 Laurann Montana, NP (cardiology): Patient presented for Raynaud's and HTN follow up. Follow up in 1 year.  Hospital visits: None in previous 6 months   Objective:  Lab Results  Component Value Date   CREATININE  0.93 08/02/2020   BUN 10 08/02/2020   GFR 61.00 08/02/2020   GFRNONAA >60 12/11/2018   GFRAA >60 12/11/2018   NA 140 08/02/2020   K 3.9 08/02/2020   CALCIUM 9.1 08/02/2020   CO2 29 08/02/2020   GLUCOSE 99 08/02/2020    Lab Results  Component Value Date/Time   GFR 61.00 08/02/2020 09:09 AM   GFR 67.19 03/12/2020 09:14 AM    Last diabetic Eye exam:  Lab Results  Component Value Date/Time   HMDIABEYEEXA No Retinopathy 02/05/2015 12:00 AM    Last diabetic Foot exam: No results found for: "HMDIABFOOTEX"   Lab Results  Component Value Date   CHOL 163 02/14/2015   HDL 52.70 02/14/2015   LDLCALC 89 02/14/2015   TRIG 106.0 02/14/2015   CHOLHDL 3 02/14/2015       Latest Ref Rng & Units 08/02/2020    9:09 AM 02/14/2015    9:36 AM 01/22/2012   10:02 AM  Hepatic Function  Total Protein 6.0 - 8.3 g/dL 6.8  6.8  7.1   Albumin 3.5 - 5.2 g/dL 4.1  4.1  4.1   AST 0 - 37 U/L 38  23  21   ALT 0 - 35 U/L _0 Alk Phosphatase 39 - 117 U/L 49  43  43   Total Bilirubin 0.2 - 1.2 mg/dL 0.6  0.8  0.8   Bilirubin, Direct 0.0 - 0.3 mg/dL  0.1  0.0     Lab Results  Component Value Date/Time   TSH 0.852 06/27/2021 12:00 AM   TSH 2.08 03/12/2020 09:14 AM       Latest Ref Rng & Units 08/02/2020  9:09 AM 12/11/2018    1:19 PM 10/04/2018    2:44 AM  CBC  WBC 4.0 - 10.5 K/uL 8.2  10.3  8.9   Hemoglobin 12.0 - 15.0 g/dL 14.2  14.9  15.4   Hematocrit 36.0 - 46.0 % 42.4  45.9  49.8   Platelets 150.0 - 400.0 K/uL 218.0  200  192     No results found for: "VD25OH"  Clinical ASCVD: No  The ASCVD Risk score (Arnett DK, et al., 2019) failed to calculate for the following reasons:   Cannot find a previous HDL lab   Cannot find a previous total cholesterol lab       11/20/2021   11:05 AM 10/21/2021    2:39 PM 10/18/2021   10:17 AM  Depression screen PHQ 2/9  Decreased Interest 0 1 0  Down, Depressed, Hopeless 0 1 2  PHQ - 2 Score 0 2 2  Altered sleeping 0 0 0  Tired,  decreased energy 0 1 0  Change in appetite 0 2 0  Feeling bad or failure about yourself  0 3 0  Trouble concentrating 0 1 0  Moving slowly or fidgety/restless 0 0 0  Suicidal thoughts 0 0 0  PHQ-9 Score 0 9 2  Difficult doing work/chores Not difficult at all Not difficult at all Not difficult at all      Social History   Tobacco Use  Smoking Status Never  Smokeless Tobacco Never   BP Readings from Last 3 Encounters:  11/20/21 122/78  11/08/21 118/66  10/21/21 138/84   Pulse Readings from Last 3 Encounters:  11/20/21 73  11/08/21 71  10/21/21 85   Wt Readings from Last 3 Encounters:  11/20/21 149 lb (67.6 kg)  11/08/21 146 lb 12.8 oz (66.6 kg)  10/21/21 152 lb 8 oz (69.2 kg)   BMI Readings from Last 3 Encounters:  11/20/21 25.18 kg/m  11/08/21 24.81 kg/m  10/21/21 25.77 kg/m    Assessment/Interventions: Review of patient past medical history, allergies, medications, health status, including review of consultants reports, laboratory and other test data, was performed as part of comprehensive evaluation and provision of chronic care management services.   SDOH:  (Social Determinants of Health) assessments and interventions performed: Yes  SDOH Interventions    Flowsheet Row Chronic Care Management from 12/16/2021 in [place] at [place] from 10/18/2021 in [place] at [place] from 10/17/2020 in [place] at [place] Management from 12/09/2019 in [place] at [place] from 10/17/2019 in [place] at [place] from 05/27/2017 in [place] at [place] Interventions -- Intervention Not Indicated Intervention Not Indicated -- Intervention Not Indicated --  Housing Interventions -- Intervention Not Indicated Intervention Not Indicated -- Intervention Not Indicated --  Transportation  Interventions -- Intervention Not Indicated Intervention Not Indicated Intervention Not Indicated Intervention Not Indicated --  Utilities Interventions -- Intervention Not Indicated -- -- -- --  Alcohol Usage Interventions -- Intervention Not Indicated (Score <7) -- -- -- --  Depression Interventions/Treatment  -- -- -- -- PHQ2-9 Score <4 Follow-up Not Indicated Counseling  Financial Strain Interventions _0  --  Physical Activity Interventions -- Intervention Not Indicated Intervention Not Indicated -- Intervention Not Indicated --  Stress Interventions -- Other (Comment)  [Pending counseling] Intervention Not Indicated -- Intervention Not Indicated --  Social Connections Interventions -- Intervention Not Indicated Intervention Not Indicated -- Intervention Not Indicated --      SDOH Screenings   Food Insecurity: No Food Insecurity (10/18/2021)  Housing: Low Risk  (10/18/2021)  Transportation Needs: No Transportation Needs (10/18/2021)  Utilities: Not At Risk (10/18/2021)  Alcohol Screen: Low Risk  (10/18/2021)  Depression (PHQ2-9): Low Risk  (11/20/2021)  Recent Concern: Depression (PHQ2-9) - Medium Risk (10/21/2021)  Financial Resource Strain: Low Risk  (12/16/2021)  Physical Activity: Inactive (10/18/2021)  Social Connections: Socially Integrated (10/18/2021)  Stress: Stress Concern Present (10/18/2021)  Tobacco Use: Low Risk  (11/08/2021)    CCM Care Plan  Allergies  Allergen Reactions   Amoxicillin-Pot Clavulanate     REACTION: nausea   Azithromycin     REACTION: nausea, Gi   Celecoxib     REACTION: nausea   Ciprofloxacin     REACTION: nausea   Doxycycline     REACTION: gi upset   Erythromycin     REACTION: nause, GI   Hydrocodone     REACTION: nausea   Hydrocodone-Acetaminophen     REACTION: GI upset   Levofloxacin     REACTION: she  claims renal failiure   Omeprazole Nausea And Vomiting   Penicillins    Propoxyphene N-Acetaminophen    Rofecoxib     REACTION: nausea   Sulfa Antibiotics    Tetanus Toxoid     Medications Reviewed Today     Reviewed by Viona Gilmore, [facility] (Pharmacist) on 12/16/21 at 1307  Med List Status: <None>   Medication Order Taking? Sig Documenting Provider Last Dose Status Informant  amLODipine (NORVASC) 5 MG tablet [id number] Yes Take 1 tablet (5 mg total) by mouth daily. Loel Dubonnet, NP Taking Active   escitalopram (LEXAPRO) 5 MG tablet [id number] Yes TAKE 1 TABLET (5 MG TOTAL) BY MOUTH DAILY. Eulas Post, MD Taking Active   famotidine (PEPCID) 40 MG tablet [id number] Yes Take 1 tablet (40 mg total) by mouth at bedtime. Milus Banister, MD Taking Active   fluticasone [facility]) 50 MCG/ACT nasal spray [id number] Yes Place 2 sprays into both nostrils daily as needed for allergies. [provider] Taking Active Self  levothyroxine (SYNTHROID) 100 MCG tablet [id number] Yes TAKE 1 TABLET BY MOUTH EVERY DAY Burchette, Alinda Sierras, MD Taking Active   meloxicam (MOBIC) 7.5 MG tablet [id number] No Take 7.5 mg by mouth daily.  Patient not taking: Reported on 11/20/2021   [provider] Not Taking Active   Polyethyl Glycol-Propyl Glycol (SYSTANE) 0.4-0.3 % SOLN [id number]  Apply 1 drop to eye. [provider]  Active   sodium chloride (OCEAN) 0.65 % nasal spray [id number] Yes Place 1 spray into the nose as needed for congestion. [provider] Taking Active             Patient Active Problem List   Diagnosis Date Noted   Depression 07/13/2020   Thrush 11/03/2012   Sjogren's disease ([place]) 01/22/2012   GERD (gastroesophageal reflux disease) 03/19/2011   Hypertension 03/19/2011   Raynaud's disease 03/19/2011   Cardiomegaly 02/26/2010   Hypothyroidism 02/26/2010   Wrist fracture, left 02/26/2010   Liver hemangioma 02/26/2010   Migraine 02/26/2010    Esophageal stricture 02/26/2010   Weight loss 02/26/2010   OTHER CHRONIC SINUSITIS 12/20/2007   Rosacea 12/20/2007    Immunization History  Administered Date(s) Administered   Fluad Quad(high Dose 65+) 09/16/2018, 10/03/2019, 09/27/2021   Influenza Split 11/22/2011   Influenza, High Dose Seasonal PF 09/14/2014,  09/07/2015, 10/07/2016, 10/26/2017, 09/16/2018, 09/21/2020   Influenza,inj,Quad PF,6+ Mos 10/05/2013   Influenza,inj,quad, With Preservative 10/06/2016, 11/02/2017   Influenza-Unspecified 11/22/2011, 10/05/2013, 09/14/2014, 09/07/2015, 10/06/2016, 10/07/2016, 10/26/2017, 11/02/2017   PFIZER(Purple Top)SARS-COV-2 Vaccination 02/11/2019, 03/08/2019, 10/04/2019, 04/20/2020   Pfizer Covid-19 Vaccine Bivalent Booster 49yr & up 10/08/2020   Pneumococcal Conjugate-13 10/05/2013   Pneumococcal Polysaccharide-23 02/19/2015   Zoster Recombinat (Shingrix) 10/08/2020   Zoster, Live 01/17/2010   Patient reports she sometimes forgets to take her new medication at night some. She can take it with her morning medications.  Patient has had some vertigo and started around the time that the Lexapro started. She reported she thinks it might be coming from her sinuses but figured she would ask if it can cause this.  Patient reports she slept really well with eating a banana every night.  She is not checking BP at home at all as she feels like she gets it checked "enough" at doctor's appointments.   Conditions to be addressed/monitored:  Hypertension, GERD, Hypothyroidism and Allergic Rhinitis  Conditions addressed this visit: Hypertension, GERD  Care Plan : CCM Pharmacy Care Plan  Updates made by PViona Gilmore RCowansince 12/16/2021 12:00 AM     Problem: Problem: Hypertension, GERD, Hypothyroidism and Allergic Rhinitis      Long-Range Goal: Patient-Specific Goal   Start Date: 06/08/2020  Expected End Date: 06/08/2021  Recent Progress: On track  Priority: High  Note:   Current  Barriers:  Unable to independently monitor therapeutic efficacy  Pharmacist Clinical Goal(s):  Patient will maintain control of blood pressure as evidenced by home blood pressure readings  through collaboration with PharmD and provider.   Interventions: 1:1 collaboration with BEulas Post MD regarding development and update of comprehensive plan of care as evidenced by provider attestation and co-signature Inter-disciplinary care team collaboration (see longitudinal plan of care) Comprehensive medication review performed; medication list updated in electronic medical record  Hypertension/Raynaud's syndrome (BP goal <140/90) -Controlled -Current treatment: Amlodipine 5 mg 1 tablet daily - Appropriate, Effective, Safe, Accessible -Medications previously tried: metoprolol (for HR) -Current home readings: not checking consistently (checking once a week - try to do that) -Current dietary habits: not eating as much -Current exercise habits: not quite as active lately -Denies hypotensive/hypertensive symptoms -Educated on BP goals and benefits of medications for prevention of heart attack, stroke and kidney damage; Exercise goal of 150 minutes per week; Importance of home blood pressure monitoring; Proper BP monitoring technique; -Counseled to monitor BP at home weekly, document, and provide log at future appointments -Counseled on diet and exercise extensively Recommended to continue current medication  GERD (Goal: minimize symptoms) -Controlled -Current treatment  Famotidine 40 mg 20 mg in the morning and 1 tablet before bedtime - Appropriate, Effective, Safe, Accessible Gaviscon as needed - Appropriate, Effective, Safe, Accessible -Medications previously tried: Pantoprazole (hurt stomach), omeprazole (N/V)  -Counseled on non-pharmacologic management of symptoms such as elevating the head of your bed, avoiding eating 2-3 hours before bed, avoiding triggering foods such as acidic,  spicy, or fatty foods, eating smaller meals, and wearing clothes that are loose around the waist  Recommended limiting meloxicam use with frequent acid reflux symptoms.  Hypothyroidism (Goal: TSH 2 - 4) -Controlled -Current treatment  Levothyroxine 100 mcg 1 tablet daily - Appropriate, Effective, Safe, Accessible -Medications previously tried: none  -Recommended to continue current medication  Allergic rhinitis (Goal: minimize symptoms) -Controlled -Current treatment  Flonase 50 mcg/act 2 sprays in both nostrils as needed - Appropriate, Effective, Safe,  Accessible Flonase sensimist as needed - Appropriate, Effective, Safe, Accessible Saline nasal spray as needed - Appropriate, Effective, Safe, Accessible -Medications previously tried: none  -Recommended to continue current medication  Osteopenia (Goal prevent fractures) -Not ideally controlled -Last DEXA Scan: 2022  T-Score femoral neck: -1.8  T-Score total hip: n/a  T-Score lumbar spine: -1.1  T-Score forearm radius: n/a  10-year probability of major osteoporotic fracture: 11.9%  10-year probability of hip fracture: 3.0% -Patient is not a candidate for pharmacologic treatment -Current treatment  No medications -Medications previously tried: none  -Recommend [phone] units of vitamin D daily. Recommend 1200 mg of calcium daily from dietary and supplemental sources. Recommend weight-bearing and muscle strengthening exercises for building and maintaining bone density. -Recommended repeat vitamin D level  Depression/Anxiety (Goal: minimize symptoms) -Controlled -Current treatment: Escitalopram 5 mg 1 tablet daily - Appropriate, Effective, Safe, Accessible -Medications previously tried/failed: none -PHQ9: 0 (11/20/21) -GAD7: n/a -Educated on Benefits of medication for symptom control Benefits of cognitive-behavioral therapy with or without medication -Recommended to continue current medication  Health Maintenance -Vaccine  gaps: shingrix (second dose), COVID booster -Current therapy:  No medications -Educated on Cost vs benefit of each product must be carefully weighed by individual consumer -Patient is satisfied with current therapy and denies issues -Recommended to continue as is  Patient Goals/Self-Care Activities Patient will:  - take medications as prescribed check blood pressure a few times a week, document, and provide at future appointments target a minimum of 150 minutes of moderate intensity exercise weekly  Follow Up Plan: Telephone follow up appointment with care management team member scheduled for:1 year      Medication Assistance: None required.  Patient affirms current coverage meets needs.  Compliance/Adherence/Medication fill history: Care Gaps: COVID booster, tetanus (allergic to it)   Star-Rating Drugs: None  Patient's preferred pharmacy is:  [id number]- [place], [place] - [id number]: [phone]: [phone]  Uses pill box? Yes Pt endorses 100% compliance  We discussed: Current pharmacy is preferred with insurance plan and patient is satisfied with pharmacy services Patient decided to: Continue current medication management strategy  Care Plan and Follow Up Patient Decision:  Patient agrees to Care Plan and Follow-up.  Plan: Telephone follow up appointment with care management team member scheduled for:  1 year  MJeni Salles PharmD BHarlanPharmacist LOccidental Petroleumat [phone]

## 2021-12-21 ENCOUNTER — Encounter (HOSPITAL_COMMUNITY): Payer: Self-pay | Admitting: Emergency Medicine

## 2021-12-21 ENCOUNTER — Emergency Department (HOSPITAL_COMMUNITY): Payer: Medicare Other

## 2021-12-21 ENCOUNTER — Other Ambulatory Visit: Payer: Self-pay

## 2021-12-21 ENCOUNTER — Emergency Department (HOSPITAL_COMMUNITY)
Admission: EM | Admit: 2021-12-21 | Discharge: 2021-12-21 | Disposition: A | Discharge status: Home / Self Care | Payer: Medicare Other | Attending: Emergency Medicine | Admitting: Emergency Medicine

## 2021-12-21 DIAGNOSIS — R509 Fever, unspecified: Secondary | ICD-10-CM | POA: Diagnosis not present

## 2021-12-21 DIAGNOSIS — M549 Dorsalgia, unspecified: Secondary | ICD-10-CM | POA: Diagnosis present

## 2021-12-21 DIAGNOSIS — M1611 Unilateral primary osteoarthritis, right hip: Secondary | ICD-10-CM | POA: Diagnosis not present

## 2021-12-21 DIAGNOSIS — M25551 Pain in right hip: Secondary | ICD-10-CM | POA: Diagnosis not present

## 2021-12-21 MED ORDER — PREDNISONE 20 MG PO TABS: 40.0000 mg | ORAL_TABLET | Freq: Every day | ORAL | 0 refills | Status: DC

## 2021-12-21 MED ORDER — OXYCODONE HCL 5 MG PO TABS
5.0000 mg | ORAL_TABLET | Freq: Once | ORAL | Status: AC
  Administered 2021-12-21: 5 mg via ORAL
  Filled 2021-12-21: qty 1

## 2021-12-21 MED ORDER — ONDANSETRON 4 MG PO TBDP
4.0000 mg | ORAL_TABLET | Freq: Once | ORAL | Status: AC
  Administered 2021-12-21: 4 mg via ORAL
  Filled 2021-12-21: qty 1

## 2021-12-21 MED ORDER — ONDANSETRON HCL 4 MG PO TABS: 4.0000 mg | ORAL_TABLET | Freq: Four times a day (QID) | ORAL | 0 refills | Status: DC

## 2021-12-21 MED ORDER — OXYCODONE HCL 5 MG PO TABS: 5.0000 mg | ORAL_TABLET | ORAL | 0 refills | Status: DC | PRN

## 2021-12-21 MED ORDER — DEXAMETHASONE SODIUM PHOSPHATE 10 MG/ML IJ SOLN: 10.0000 mg | Freq: Once | INTRAMUSCULAR | Status: DC

## 2021-12-21 MED ORDER — LIDOCAINE 5 % EX PTCH
1.0000 | MEDICATED_PATCH | CUTANEOUS | Status: DC
  Administered 2021-12-21: 1 via TRANSDERMAL
  Filled 2021-12-21: qty 1

## 2021-12-21 NOTE — ED Notes (Signed)
Patient reports taking Meloxicam and Tylenol for pain without relief.  No hx of sciatica

## 2021-12-21 NOTE — Discharge Instructions (Addendum)
You were seen in the ER today for evaluation of your right hip/back pain. I think this is likely your sciatica and SI joint pain. I would like for you to follow up your primary care doctor and your orthopedic provider for further evaluation.  I am sending you home on a few medications.  The first 1 is prednisone.  You will take 2 tablets once a day for the next 5 days.  Additionally, I am sending home with some Roxicodone to take every 4 hours as needed for pain.  You can take this along with the Zofran which help you with some nausea and vomiting.  Please make sure you do not drive or operate any heavy machinery on this medication as it can make you fatigued.  You can also pick up lidocaine patches over-the-counter which can help you with your pain. You can add '1000mg'$  of Tylenol every 6 hours as needed for pain as well.  If you start to have any fevers, worsening pain, numbness, tingling, any loss of your bowel or bladder function, pain with urination, please return to the nearest emergency room for evaluation.  Contact a health care provider if: You have pain that is not relieved with rest or medicine. You have increasing pain going down into your legs or buttocks. Your pain does not improve after 2 weeks. You have pain at night. You lose weight without trying. You have a fever or chills. You develop nausea or vomiting. You develop abdominal pain. Get help right away if: You develop new bowel or bladder control problems. You have unusual weakness or numbness in your arms or legs. You feel faint. These symptoms may represent a serious problem that is an emergency. Do not wait to see if the symptoms will go away. Get medical help right away. Call your local emergency services (911 in the [place] Do not drive yourself to the hospital.

## 2021-12-21 NOTE — ED Provider Notes (Addendum)
[facility]-EMERGENCY DEPT Provider Note   CSN: [id number] Arrival date & time: 12/21/21  0433     History Chief Complaint  Patient presents with   Hip Pain    KAMIKO RAZAVI is a 76 y.o. female presents to the ER for evaluation of atraumatic back pain radiating down her right leg since Thursday. She reports that she has arthritis. Her husband reports that they have been trying to walk more and thinks this may have flaired this up.  She reports that the pain radiates down the right buttock into the upper part of her thigh.  She has tried some meloxicam leftover from previous knee pain and Tylenol has not had much relief.  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  Not sure why the nursing note says is that this is what was reported to me.  She denies any loss of bladder or bowel control, saddle anesthesia, fevers, or IV drug use.  She reports that the pain has been worsening for the past few days and she is having use her walker at home to walk around.  She denies any recent injections into the area.  She denies any weakness in the leg.   Hip Pain Pertinent negatives include no chest pain and no shortness of breath.       Home Medications Prior to Admission medications   Medication Sig Start Date End Date Taking? Authorizing Provider  amLODipine (NORVASC) 5 MG tablet Take 1 tablet (5 mg total) by mouth daily. 11/08/21   Alver Sorrow, NP  escitalopram (LEXAPRO) 5 MG tablet TAKE 1 TABLET (5 MG TOTAL) BY MOUTH DAILY. 11/14/21   Burchette, Elberta Fortis, MD  famotidine (PEPCID) 40 MG tablet Take 1 tablet (40 mg total) by mouth at bedtime. 11/06/20   Rachael Fee, MD  fluticasone [facility]) 50 MCG/ACT nasal spray Place 2 sprays into both nostrils daily as needed for allergies.    [provider]  levothyroxine (SYNTHROID) 100 MCG tablet TAKE 1 TABLET BY MOUTH EVERY DAY 08/08/21   Burchette, Elberta Fortis, MD  meloxicam (MOBIC) 7.5 MG tablet Take 7.5 mg by  mouth daily. Patient not taking: Reported on 11/20/2021    [provider]  Polyethyl Glycol-Propyl Glycol (SYSTANE) 0.4-0.3 % SOLN Apply 1 drop to eye.    [provider]  sodium chloride (OCEAN) 0.65 % nasal spray Place 1 spray into the nose as needed for congestion.    [provider]      Allergies    Amoxicillin-pot clavulanate, Azithromycin, Celecoxib, Ciprofloxacin, Doxycycline, Erythromycin, Hydrocodone, Hydrocodone-acetaminophen, Levofloxacin, Omeprazole, Penicillins, Propoxyphene n-acetaminophen, Rofecoxib, Sulfa antibiotics, and Tetanus toxoid    Review of Systems   Review of Systems  Constitutional:  Negative for chills and fever.  Respiratory:  Negative for shortness of breath.   Cardiovascular:  Negative for chest pain.  Gastrointestinal:  Negative for abdominal distention, constipation, diarrhea, nausea and vomiting.       Denies any fecal incontinence  Genitourinary:  Negative for dysuria and hematuria.       Denies any urinary incontinence or urinary retention.  Musculoskeletal:  Positive for back pain.       Denies any saddle anesthesia  Neurological:  Negative for weakness and numbness.    Physical Exam Updated Vital Signs BP 113/74 (BP Location: Left Arm)   Pulse 86   Temp 98.2 F (36.8 C) (Oral)   Resp 18   Ht 5' 4.5" (1.638 m)   Wt 66 kg  SpO2 100%   BMI 24.61 kg/m  Physical Exam Vitals and nursing note reviewed.  Constitutional:      General: She is not in acute distress.    Appearance: Normal appearance. She is not ill-appearing or toxic-appearing.  Eyes:     General: No scleral icterus. Pulmonary:     Effort: Pulmonary effort is normal. No respiratory distress.  Musculoskeletal:     Comments: No midline or paraspinal cervical, thoracic, or lumbar tenderness palpation.  No step-offs or deformities.  Patient does have some pain overlying the right SI joint.  She has palpable distal pulses.  Compartments are soft.   Sensation is intact and symmetric.  Strength is 5 out of 5 in patient's upper and lower bilateral extremities.  She does have some increased pain against resistance on the right leg.  She is ambulatory with a walker.  No dropfoot noted.  Skin:    General: Skin is dry.     Findings: No rash.  Neurological:     General: No focal deficit present.     Mental Status: She is alert. Mental status is at baseline.  Psychiatric:        Mood and Affect: Mood normal.     ED Results / Procedures / Treatments   Labs (all labs ordered are listed, but only abnormal results are displayed) Labs Reviewed - No data to display  EKG None  Radiology DG Hip Unilat W or Wo Pelvis 2-3 Views Right  Result Date: 12/21/2021 CLINICAL DATA:  76 year old female with history of hip pain. EXAM: DG HIP (WITH OR WITHOUT PELVIS) 2-3V RIGHT COMPARISON:  No priors. FINDINGS: Three views of the bony pelvis in the right hip demonstrate no acute displaced fracture of the bony pelvic ring. Bilateral proximal femurs as visualized are intact and the right femoral head is properly located. Joint space narrowing, subchondral sclerosis, subchondral cyst formation and osteophyte formation is noted in the hip joints bilaterally indicative of moderate bilateral hip joint osteoarthritis. IMPRESSION: 1. No acute radiographic abnormality of the bony pelvis or the right hip. 2. Moderate bilateral hip joint osteoarthritis. Electronically Signed   By: Trudie Reed M.D.   On: 12/21/2021 06:16    Procedures Procedures   Medications Ordered in ED Medications  oxyCODONE (Oxy IR/ROXICODONE) immediate release tablet 5 mg (5 mg Oral Given 12/21/21 1241)  ondansetron (ZOFRAN-ODT) disintegrating tablet 4 mg (4 mg Oral Given 12/21/21 1242)    ED Course/ Medical Decision Making/ A&P                           Medical Decision Making Amount and/or Complexity of Data Reviewed Radiology: ordered.  Risk Prescription drug management.  76  y/o F presents to the ER for evaluation of atraumatic right hip pain for the past 3 days.  Differential diagnosis includes limited to sciatica, SI joint pain, arthritis, fracture, dislocation, sprain, strain, septic arthritis.  Vital signs are unremarkable.  Physical exam as noted above.  X-ray imaging shows no acute radiographic abnormality of the bony pelvis or right hip.  There is moderate bilateral hip joint osteoarthritis seen however.  I discussed with my attending who recommended giving her a few doses of narcotic pain medication as well as a shot of Decadron.  The patient could be sent home.  I discussed with the patient.  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  Will elect to burst of prednisone.  Since she has  multiple allergies to pain medications such as only nausea and vomiting.  Will prescribe her some oxycodone with Zofran.  Patient is amenable to this.  I doubt any septic arthritis the patient has not had stable vital signs. She has no overlying skin changes noted.  She still has movement of the hip without any pain.  I do not think the patient has any occult fracture given that the pain is atraumatic.  She reports that she does have a history of sciatic and this feels similar.  I doubt any cauda equina as patient does not have any fecal or urinary incontinence, saddle anesthesia, or any fevers.  There is no neurodeficit noted.  She already has an orthopedic surgeon that she can follow-up with.  We discussed return precautions and red flag symptoms.  Patient verbalizes understanding and agrees to the plan.  Patient is stable being discharged home in good condition.  I discussed this case with my attending physician who cosigned this note including patient's presenting symptoms, physical exam, and planned diagnostics and interventions. Attending physician stated agreement with plan or made changes to plan which were implemented.   Final Clinical Impression(s) / ED  Diagnoses Final diagnoses:  Right hip pain    Rx / DC Orders ED Discharge Orders          Ordered    predniSONE (DELTASONE) 20 MG tablet  Daily        12/21/21 1232    oxyCODONE (ROXICODONE) 5 MG immediate release tablet  Every 4 hours PRN        12/21/21 1232    ondansetron (ZOFRAN) 4 MG tablet  Every 6 hours        12/21/21 1232              Achille Rich, PA-C 12/23/21 1808    Achille Rich, PA-C 12/23/21 1808    Rolan Bucco, MD 12/26/21 [phone]

## 2021-12-21 NOTE — ED Triage Notes (Signed)
Patient BIB EMS for evaluation of R hip pain.  Reports pain started on Thursday.  Radiates from R buttocks to leg.  No reports of recent falls.  Hx of arthritis.

## 2021-12-24 ENCOUNTER — Encounter: Payer: Self-pay | Admitting: Family Medicine

## 2021-12-24 ENCOUNTER — Ambulatory Visit (INDEPENDENT_AMBULATORY_CARE_PROVIDER_SITE_OTHER): Payer: Medicare Other | Admitting: Family Medicine

## 2021-12-24 ENCOUNTER — Telehealth: Payer: Self-pay

## 2021-12-24 VITALS — BP 134/60 | HR 73 | Temp 98.1°F | Ht 64.5 in | Wt 153.7 lb

## 2021-12-24 DIAGNOSIS — M160 Bilateral primary osteoarthritis of hip: Secondary | ICD-10-CM | POA: Diagnosis not present

## 2021-12-24 DIAGNOSIS — M533 Sacrococcygeal disorders, not elsewhere classified: Secondary | ICD-10-CM | POA: Diagnosis not present

## 2021-12-24 MED ORDER — PREDNISONE 20 MG PO TABS: 40.0000 mg | ORAL_TABLET | Freq: Every day | ORAL | 0 refills | Status: AC

## 2021-12-24 NOTE — Progress Notes (Signed)
Established Patient Office Visit  Subjective   Patient ID: SUNDAY KLOS, female    DOB: 06-05-1945  Age: 76 y.o. MRN: [id number]  Chief Complaint  Patient presents with   Hospitalization Follow-up    HPI   Josephine is seen for ER follow-up.  Last Thursday she and her husband were walking their dog.  She noticed that she had some tightness in her right hip after that and by Friday was much worse.  By Friday night she could barely walk and was having to walk with walker and had severe pain with moving her legs even small amounts.  She is going to ER 3 AM Saturday.  There is no known recent injury.  She had x-rays that showed moderate osteoarthritis involving both hips.  No acute pelvic abnormalities otherwise.  Was prescribed oxycodone which she has taken sparingly and given prednisone which she thinks has helped.  She is ambulating but still having some pain.  Overall much improved.  Still has some right sacroiliac pain.  She had some pain radiating toward the buttock from the right lumbar area but never down the leg.  No weakness or numbness.  No medial hip or groin pain.  She actually has scheduled follow-up this Friday with [facility].  Generally tolerates walking without difficulty and stays very active  Past Medical History:  Diagnosis Date   Arthritis    Cardiomegaly    Depression    GERD (gastroesophageal reflux disease)    Hypertension    Hypothyroidism    Seizures (HCC)    Sinus tachycardia    Sjogren's syndrome ([place])    Status post dilation of esophageal narrowing 2012   Dr. Earlean Shawl   Past Surgical History:  Procedure Laterality Date   HAND TENDON SURGERY  02/09/2007   tendon transfer-Dr. Amedeo Plenty   NASAL SEPTUM SURGERY     operated on by Dr. Ernesto Rutherford 20 years ago   OVARIAN CYST REMOVAL     Right Sided Salpingo-oopherectomy   SALPINGOOPHORECTOMY     WISDOM TOOTH EXTRACTION      reports that she has never smoked. She has never used smokeless tobacco. She reports  current alcohol use. She reports that she does not use drugs. family history includes Alcohol abuse in her maternal grandfather and maternal grandmother; Allergies in an other family member; Cancer in her father and mother; Heart attack (age of onset: 30) in her father; Hypothyroidism in her mother; Stroke (age of onset: 78) in her mother. Allergies  Allergen Reactions   Amoxicillin-Pot Clavulanate     REACTION: nausea   Azithromycin     REACTION: nausea, Gi   Celecoxib     REACTION: nausea   Ciprofloxacin     REACTION: nausea   Doxycycline     REACTION: gi upset   Erythromycin     REACTION: nause, GI   Hydrocodone     REACTION: nausea   Hydrocodone-Acetaminophen     REACTION: GI upset   Levofloxacin     REACTION: she claims renal failiure   Omeprazole Nausea And Vomiting   Penicillins    Propoxyphene N-Acetaminophen    Rofecoxib     REACTION: nausea   Sulfa Antibiotics    Tetanus Toxoid     Review of Systems  Constitutional:  Negative for chills and fever.  Cardiovascular:  Negative for chest pain.  Musculoskeletal:  Positive for back pain. Negative for falls.  Neurological:  Negative for tingling and weakness.      Objective:  BP 134/60 (BP Location: Left Arm, Patient Position: Sitting, Cuff Size: Normal)   Pulse 73   Temp 98.1 F (36.7 C) (Oral)   Ht 5' 4.5" (1.638 m)   Wt 153 lb 11.2 oz (69.7 kg)   SpO2 99%   BMI 25.98 kg/m    Physical Exam Vitals reviewed.  Constitutional:      Appearance: Normal appearance.  Cardiovascular:     Rate and Rhythm: Normal rate and regular rhythm.  Pulmonary:     Effort: Pulmonary effort is normal.     Breath sounds: Normal breath sounds.  Musculoskeletal:     Comments: Straight leg raises are negative bilaterally.  She has fair range of motion in her right hip only minimal pain with external rotation.  Does have some right sacroiliac tenderness.  Neurological:     Mental Status: She is alert.     Comments: Full  strength lower extremities throughout.  She has 1+ patellar and Achilles reflex bilaterally      No results found for any visits on 12/24/21.    The ASCVD Risk score (Arnett DK, et al., 2019) failed to calculate for the following reasons:   Cannot find a previous HDL lab   Cannot find a previous total cholesterol lab    Assessment & Plan:   #1 recent x-ray with moderate osteoarthritis changes.  Not clear though this is causing her recent acute pain.  With acute onset thinks she may have more acute sacroiliac dysfunction.  She generally is not had problems with walking in the past.  She does have pending follow-up with orthopedist.  #2 right lumbar and right buttock pain.  Doubt sciatica.  Question sacroiliac.  She is improving some with prednisone.  We did extend her prednisone for another few days.  She is using oxycodone sparingly for severe pain but much improved after starting prednisone.  We have encouraged her to keep appointment with orthopedist this Friday  [name], MD

## 2021-12-24 NOTE — Patient Outreach (Signed)
  Care Coordination [facility] Note Transition Care Management Follow-up Telephone Call Date of discharge and from where: 12/21/21- [facility]   Dx: "Right hip pain" Red on EMMI-ED Discharge Alert Reason: "Scheduled follow-up appt? No" Red Alert Date: 12/22/21 How have you been since you were released from the hospital? Patient states she is "doing better." She is using walker prn. She has stopped taking Oxycodone and pain has been managed. Patient voices that she noticed when she took pain med-it affected her urine stream flow. Since stopping med-things have returned to normal. She had BM this morning. She is sleeping good.  Any questions or concerns? No  Items Reviewed: Did the pt receive and understand the discharge instructions provided? Yes  Medications obtained and verified?  Patient has MD appt in a few Other? Yes  Any new allergies since your discharge? No  Dietary orders reviewed? Yes Do you have support at home? Yes   Home Care and Equipment/Supplies: Were home health services ordered? not applicable If so, what is the name of the agency? N/A  Has the agency set up a time to come to the patient's home? not applicable Were any new equipment or medical supplies ordered?  No What is the name of the medical supply agency? N/A Were you able to get the supplies/equipment? not applicable Do you have any questions related to the use of the equipment or supplies? No  Functional Questionnaire: (I = Independent and D = Dependent) ADLs: A  Bathing/Dressing- A  Meal Prep-A  Eating- I  Maintaining continence- I  Transferring/Ambulation- A  Managing Meds- I  Follow up appointments reviewed:  PCP Hospital f/u appt confirmed? Yes  Scheduled to see Dr. Elease Hashimoto on this afternoon. [facility] f/u appt confirmed? Yes  Scheduled to see ortho MD on 12/27/21 . Are transportation arrangements needed? No  If their condition worsens, is the pt aware to call PCP or go to the  Emergency Dept.? Yes Was the patient provided with contact information for the PCP's office or ED? Yes Was to pt encouraged to call back with questions or concerns? Yes  SDOH assessments and interventions completed:   Yes SDOH Interventions Today    Flowsheet Row Most Recent Value  SDOH Interventions   Food Insecurity Interventions Intervention Not Indicated  Transportation Interventions Intervention Not Indicated       Care Coordination Interventions:  Education provided    Encounter Outcome:  Pt. Visit Completed    Enzo Montgomery, RN,BSN,CCM [facility] Telephonic Care Management Coordinator Direct Phone: [phone] Toll Free: [phone] Fax: [phone]

## 2021-12-27 DIAGNOSIS — M25551 Pain in right hip: Secondary | ICD-10-CM | POA: Diagnosis not present

## 2022-01-05 DIAGNOSIS — I10 Essential (primary) hypertension: Secondary | ICD-10-CM

## 2022-01-05 DIAGNOSIS — F324 Major depressive disorder, single episode, in partial remission: Secondary | ICD-10-CM

## 2022-01-06 HISTORY — PX: CATARACT EXTRACTION, BILATERAL: SHX1313

## 2022-01-06 HISTORY — PX: BASAL CELL CARCINOMA EXCISION: SHX1214

## 2022-01-20 DIAGNOSIS — M25551 Pain in right hip: Secondary | ICD-10-CM | POA: Diagnosis not present

## 2022-01-30 DIAGNOSIS — H25813 Combined forms of age-related cataract, bilateral: Secondary | ICD-10-CM | POA: Diagnosis not present

## 2022-01-30 DIAGNOSIS — H524 Presbyopia: Secondary | ICD-10-CM | POA: Diagnosis not present

## 2022-01-30 DIAGNOSIS — H52223 Regular astigmatism, bilateral: Secondary | ICD-10-CM | POA: Diagnosis not present

## 2022-01-30 DIAGNOSIS — D3132 Benign neoplasm of left choroid: Secondary | ICD-10-CM | POA: Diagnosis not present

## 2022-01-30 DIAGNOSIS — H5203 Hypermetropia, bilateral: Secondary | ICD-10-CM | POA: Diagnosis not present

## 2022-02-03 DIAGNOSIS — M25551 Pain in right hip: Secondary | ICD-10-CM | POA: Diagnosis not present

## 2022-02-05 ENCOUNTER — Other Ambulatory Visit: Payer: Self-pay | Admitting: Family Medicine

## 2022-02-11 ENCOUNTER — Other Ambulatory Visit: Payer: Self-pay | Admitting: Family Medicine

## 2022-02-14 DIAGNOSIS — D485 Neoplasm of uncertain behavior of skin: Secondary | ICD-10-CM | POA: Diagnosis not present

## 2022-02-14 DIAGNOSIS — Z85828 Personal history of other malignant neoplasm of skin: Secondary | ICD-10-CM | POA: Diagnosis not present

## 2022-02-14 DIAGNOSIS — D225 Melanocytic nevi of trunk: Secondary | ICD-10-CM | POA: Diagnosis not present

## 2022-02-14 DIAGNOSIS — L814 Other melanin hyperpigmentation: Secondary | ICD-10-CM | POA: Diagnosis not present

## 2022-02-14 DIAGNOSIS — L720 Epidermal cyst: Secondary | ICD-10-CM | POA: Diagnosis not present

## 2022-02-14 DIAGNOSIS — I781 Nevus, non-neoplastic: Secondary | ICD-10-CM | POA: Diagnosis not present

## 2022-02-14 DIAGNOSIS — C44319 Basal cell carcinoma of skin of other parts of face: Secondary | ICD-10-CM | POA: Diagnosis not present

## 2022-02-14 DIAGNOSIS — L821 Other seborrheic keratosis: Secondary | ICD-10-CM | POA: Diagnosis not present

## 2022-02-14 DIAGNOSIS — D2262 Melanocytic nevi of left upper limb, including shoulder: Secondary | ICD-10-CM | POA: Diagnosis not present

## 2022-02-28 DIAGNOSIS — H2511 Age-related nuclear cataract, right eye: Secondary | ICD-10-CM | POA: Diagnosis not present

## 2022-02-28 DIAGNOSIS — H18413 Arcus senilis, bilateral: Secondary | ICD-10-CM | POA: Diagnosis not present

## 2022-02-28 DIAGNOSIS — H2513 Age-related nuclear cataract, bilateral: Secondary | ICD-10-CM | POA: Diagnosis not present

## 2022-02-28 DIAGNOSIS — H25013 Cortical age-related cataract, bilateral: Secondary | ICD-10-CM | POA: Diagnosis not present

## 2022-02-28 DIAGNOSIS — H25043 Posterior subcapsular polar age-related cataract, bilateral: Secondary | ICD-10-CM | POA: Diagnosis not present

## 2022-03-01 DIAGNOSIS — Z23 Encounter for immunization: Secondary | ICD-10-CM | POA: Diagnosis not present

## 2022-03-11 DIAGNOSIS — D3132 Benign neoplasm of left choroid: Secondary | ICD-10-CM | POA: Diagnosis not present

## 2022-03-11 DIAGNOSIS — M3500 Sicca syndrome, unspecified: Secondary | ICD-10-CM | POA: Diagnosis not present

## 2022-03-11 DIAGNOSIS — H04123 Dry eye syndrome of bilateral lacrimal glands: Secondary | ICD-10-CM | POA: Diagnosis not present

## 2022-03-11 DIAGNOSIS — H2513 Age-related nuclear cataract, bilateral: Secondary | ICD-10-CM | POA: Diagnosis not present

## 2022-03-11 DIAGNOSIS — H43819 Vitreous degeneration, unspecified eye: Secondary | ICD-10-CM | POA: Diagnosis not present

## 2022-03-18 DIAGNOSIS — Z85828 Personal history of other malignant neoplasm of skin: Secondary | ICD-10-CM | POA: Diagnosis not present

## 2022-03-18 DIAGNOSIS — C44319 Basal cell carcinoma of skin of other parts of face: Secondary | ICD-10-CM | POA: Diagnosis not present

## 2022-03-31 DIAGNOSIS — H2513 Age-related nuclear cataract, bilateral: Secondary | ICD-10-CM | POA: Diagnosis not present

## 2022-03-31 DIAGNOSIS — M3500 Sicca syndrome, unspecified: Secondary | ICD-10-CM | POA: Diagnosis not present

## 2022-03-31 DIAGNOSIS — H25013 Cortical age-related cataract, bilateral: Secondary | ICD-10-CM | POA: Diagnosis not present

## 2022-03-31 DIAGNOSIS — H25043 Posterior subcapsular polar age-related cataract, bilateral: Secondary | ICD-10-CM | POA: Diagnosis not present

## 2022-04-16 DIAGNOSIS — H2513 Age-related nuclear cataract, bilateral: Secondary | ICD-10-CM | POA: Diagnosis not present

## 2022-04-16 DIAGNOSIS — H25013 Cortical age-related cataract, bilateral: Secondary | ICD-10-CM | POA: Diagnosis not present

## 2022-04-16 DIAGNOSIS — H25043 Posterior subcapsular polar age-related cataract, bilateral: Secondary | ICD-10-CM | POA: Diagnosis not present

## 2022-04-16 DIAGNOSIS — H18413 Arcus senilis, bilateral: Secondary | ICD-10-CM | POA: Diagnosis not present

## 2022-04-19 ENCOUNTER — Other Ambulatory Visit: Payer: Self-pay | Admitting: Family Medicine

## 2022-04-19 DIAGNOSIS — K219 Gastro-esophageal reflux disease without esophagitis: Secondary | ICD-10-CM

## 2022-05-03 ENCOUNTER — Emergency Department (HOSPITAL_BASED_OUTPATIENT_CLINIC_OR_DEPARTMENT_OTHER)
Admission: EM | Admit: 2022-05-03 | Discharge: 2022-05-03 | Disposition: A | Discharge status: Home / Self Care | Payer: Medicare Other | Attending: Emergency Medicine | Admitting: Emergency Medicine

## 2022-05-03 ENCOUNTER — Emergency Department (HOSPITAL_BASED_OUTPATIENT_CLINIC_OR_DEPARTMENT_OTHER): Payer: Medicare Other

## 2022-05-03 ENCOUNTER — Encounter (HOSPITAL_BASED_OUTPATIENT_CLINIC_OR_DEPARTMENT_OTHER): Payer: Self-pay | Admitting: Emergency Medicine

## 2022-05-03 ENCOUNTER — Other Ambulatory Visit: Payer: Self-pay

## 2022-05-03 DIAGNOSIS — Z79899 Other long term (current) drug therapy: Secondary | ICD-10-CM | POA: Diagnosis not present

## 2022-05-03 DIAGNOSIS — E039 Hypothyroidism, unspecified: Secondary | ICD-10-CM | POA: Diagnosis not present

## 2022-05-03 DIAGNOSIS — M546 Pain in thoracic spine: Secondary | ICD-10-CM | POA: Diagnosis not present

## 2022-05-03 DIAGNOSIS — R0789 Other chest pain: Secondary | ICD-10-CM | POA: Diagnosis not present

## 2022-05-03 DIAGNOSIS — I10 Essential (primary) hypertension: Secondary | ICD-10-CM | POA: Diagnosis not present

## 2022-05-03 DIAGNOSIS — M47814 Spondylosis without myelopathy or radiculopathy, thoracic region: Secondary | ICD-10-CM | POA: Diagnosis not present

## 2022-05-03 DIAGNOSIS — R079 Chest pain, unspecified: Secondary | ICD-10-CM | POA: Diagnosis not present

## 2022-05-03 DIAGNOSIS — I700 Atherosclerosis of aorta: Secondary | ICD-10-CM | POA: Diagnosis not present

## 2022-05-03 LAB — CBC WITH DIFFERENTIAL/PLATELET
Abs Immature Granulocytes: 0.03 10*3/uL (ref 0.00–0.07)
Basophils Absolute: 0 10*3/uL (ref 0.0–0.1)
Basophils Relative: 0 %
Eosinophils Absolute: 0.1 10*3/uL (ref 0.0–0.5)
Eosinophils Relative: 1 %
HCT: 42.7 % (ref 36.0–46.0)
Hemoglobin: 14.3 g/dL (ref 12.0–15.0)
Immature Granulocytes: 0 %
Lymphocytes Relative: 9 %
Lymphs Abs: 0.9 10*3/uL (ref 0.7–4.0)
MCH: 30.3 pg (ref 26.0–34.0)
MCHC: 33.5 g/dL (ref 30.0–36.0)
MCV: 90.5 fL (ref 80.0–100.0)
Monocytes Absolute: 0.6 10*3/uL (ref 0.1–1.0)
Monocytes Relative: 6 %
Neutro Abs: 8.3 10*3/uL — ABNORMAL HIGH (ref 1.7–7.7)
Neutrophils Relative %: 84 %
Platelets: 197 10*3/uL (ref 150–400)
RBC: 4.72 MIL/uL (ref 3.87–5.11)
RDW: 13.9 % (ref 11.5–15.5)
WBC: 9.9 10*3/uL (ref 4.0–10.5)
nRBC: 0 % (ref 0.0–0.2)

## 2022-05-03 LAB — TROPONIN I (HIGH SENSITIVITY)
Troponin I (High Sensitivity): 12 ng/L (ref <18)
Troponin I (High Sensitivity): 13 ng/L (ref <18)

## 2022-05-03 LAB — COMPREHENSIVE METABOLIC PANEL
ALT: 9 U/L (ref 0–44)
AST: 17 U/L (ref 15–41)
Albumin: 4.3 g/dL (ref 3.5–5.0)
Alkaline Phosphatase: 53 U/L (ref 38–126)
Anion gap: 9 (ref 5–15)
BUN: 14 mg/dL (ref 8–23)
CO2: 28 mmol/L (ref 22–32)
Calcium: 9.5 mg/dL (ref 8.9–10.3)
Chloride: 102 mmol/L (ref 98–111)
Creatinine, Ser: 0.78 mg/dL (ref 0.44–1.00)
GFR, Estimated: >60 mL/min (ref >60)
Glucose, Bld: 98 mg/dL (ref 70–99)
Potassium: 3.8 mmol/L (ref 3.5–5.1)
Sodium: 139 mmol/L (ref 135–145)
Total Bilirubin: 0.9 mg/dL (ref 0.3–1.2)
Total Protein: 7.8 g/dL (ref 6.5–8.1)

## 2022-05-03 MED ORDER — OXYCODONE HCL 5 MG PO TABS: 5.0000 mg | ORAL_TABLET | ORAL | 0 refills | Status: AC | PRN

## 2022-05-03 MED ORDER — METHYLPREDNISOLONE 4 MG PO TBPK: ORAL_TABLET | ORAL | 0 refills | Status: DC

## 2022-05-03 MED ORDER — IOHEXOL 350 MG/ML SOLN
100.0000 mL | Freq: Once | INTRAVENOUS | Status: AC | PRN
  Administered 2022-05-03: 100 mL via INTRAVENOUS

## 2022-05-03 MED ORDER — CYCLOBENZAPRINE HCL 10 MG PO TABS: 10.0000 mg | ORAL_TABLET | Freq: Two times a day (BID) | ORAL | 0 refills | Status: DC | PRN

## 2022-05-03 MED ORDER — FENTANYL CITRATE PF 50 MCG/ML IJ SOSY
50.0000 ug | PREFILLED_SYRINGE | Freq: Once | INTRAMUSCULAR | Status: AC
  Administered 2022-05-03: 50 ug via INTRAVENOUS
  Filled 2022-05-03: qty 1

## 2022-05-03 NOTE — Discharge Instructions (Addendum)
For your pain, you may take up to 1000mg of acetaminophen (tylenol) 4 times daily for up to a week. This is the maximum dose of acetminophen (tylenol) you can take from all sources. Please check other over-the-counter medications and prescriptions to ensure you are not taking other medications that contain acetaminophen.  You may also take ibuprofen 400 mg 6 times a day OR 600mg 4 times a day alternating with or at the same time as tylenol.  Take oxycodone as needed for breakthrough pain.  This medication can be addicting, sedating and cause constipation.    

## 2022-05-03 NOTE — ED Notes (Signed)
Patient transported to CT 

## 2022-05-03 NOTE — ED Triage Notes (Signed)
Pt POV from home, ambulatory from triage. Pt caox4 c/o mid thoracic back pain just to the right of the spine described as stabbing that has been ongoing since Wednesday. Pt reports intermittent numbness in the R arm. Pain worsens on movement, position changes, and deep breathing. Pt denies any recent fall/injury/trauma. No hx DVT / PE, pt does report long road trip recently.

## 2022-05-03 NOTE — ED Provider Notes (Signed)
[facility] Provider Note   CSN: [id number] Arrival date & time: 05/03/22  4010     History  Chief Complaint  Patient presents with   Back Pain    Christy Moore is a 77 y.o. female.  HPI    77 year old female with a history of hypertension, Sjogren's, arthritis, right hip pain who presents with concern for stabbing back pain.  Reports that she drove 200 miles on Tuesday and Wednesday, on Wednesday night developed severe pain to the right side of her back.  Reports that the pain is worse with deep breaths.  She at times feels that she has some numbness in her right arm.  The pain is worse with any type of torso movement or arm movement.  It is a stabbing pain in the center of the back slightly to the right with radiation to the chest.  No fever, cough, congestion, leg pain or swelling. No hx of DVT/PE. Has had pinched nerves in the past. Scheduled for cataract surgery Monday.    Past Medical History:  Diagnosis Date   Arthritis    Cardiomegaly    Depression    GERD (gastroesophageal reflux disease)    Hypertension    Hypothyroidism    Seizures (HCC)    Sinus tachycardia    Sjogren's syndrome (HCC)    Status post dilation of esophageal narrowing 2012   Dr. Kinnie Scales     Home Medications Prior to Admission medications   Medication Sig Start Date End Date Taking? Authorizing Provider  cyclobenzaprine (FLEXERIL) 10 MG tablet Take 1 tablet (10 mg total) by mouth 2 (two) times daily as needed for muscle spasms. 05/03/22  Yes Alvira Monday, MD  methylPREDNISolone (MEDROL DOSEPAK) 4 MG TBPK tablet See instructions 05/03/22  Yes Alvira Monday, MD  oxyCODONE (ROXICODONE) 5 MG immediate release tablet Take 1 tablet (5 mg total) by mouth every 4 (four) hours as needed for up to 15 days for severe pain. 05/03/22 05/18/22 Yes Alvira Monday, MD  amLODipine (NORVASC) 5 MG tablet Take 1 tablet (5 mg total) by mouth daily. 11/08/21   Alver Sorrow, NP  escitalopram (LEXAPRO) 5 MG tablet TAKE 1 TABLET (5 MG TOTAL) BY MOUTH DAILY. 02/12/22   Burchette, Elberta Fortis, MD  famotidine (PEPCID) 40 MG tablet TAKE 1 TABLET BY MOUTH EVERYDAY AT BEDTIME 04/21/22   Philip Aspen, Limmie Patricia, MD  fluticasone [facility]) 50 MCG/ACT nasal spray Place 2 sprays into both nostrils daily as needed for allergies.    [provider]  levothyroxine (SYNTHROID) 100 MCG tablet TAKE 1 TABLET BY MOUTH EVERY DAY 02/05/22   Burchette, Elberta Fortis, MD  meloxicam (MOBIC) 7.5 MG tablet Take 7.5 mg by mouth daily.    [provider]  ondansetron (ZOFRAN) 4 MG tablet Take 1 tablet (4 mg total) by mouth every 6 (six) hours. 12/21/21   Achille Rich, PA-C  Polyethyl Glycol-Propyl Glycol (SYSTANE) 0.4-0.3 % SOLN Apply 1 drop to eye.    [provider]  sodium chloride (OCEAN) 0.65 % nasal spray Place 1 spray into the nose as needed for congestion.    [provider]      Allergies    Amoxicillin-pot clavulanate, Azithromycin, Celecoxib, Ciprofloxacin, Doxycycline, Erythromycin, Hydrocodone, Hydrocodone-acetaminophen, Levofloxacin, Omeprazole, Penicillins, Propoxyphene n-acetaminophen, Rofecoxib, Sulfa antibiotics, and Tetanus toxoid    Review of Systems   Review of Systems  Physical Exam Updated Vital Signs BP 137/71 (BP Location: Right Arm)   Pulse 81  Temp 99.1 F (37.3 C) (Oral)   Resp 18   Ht 5' 4.5" (1.638 m)   Wt 68.3 kg   SpO2 98%   BMI 25.45 kg/m  Physical Exam Vitals and nursing note reviewed.  Constitutional:      General: She is not in acute distress.    Appearance: She is well-developed. She is not diaphoretic.  HENT:     Head: Normocephalic and atraumatic.  Eyes:     Conjunctiva/sclera: Conjunctivae normal.  Cardiovascular:     Rate and Rhythm: Normal rate and regular rhythm.     Pulses: Normal pulses.     Heart sounds: Normal heart sounds. No murmur heard.    No friction rub. No gallop.  Pulmonary:      Effort: Pulmonary effort is normal. No respiratory distress.     Breath sounds: Normal breath sounds. No wheezing or rales.  Abdominal:     General: There is no distension.     Palpations: Abdomen is soft.     Tenderness: There is no abdominal tenderness. There is no guarding.  Musculoskeletal:        General: Tenderness (just to right of spine around T4, minimal tenderness, pain worse iwth movements) present.     Cervical back: Normal range of motion.  Skin:    General: Skin is warm and dry.     Findings: No erythema or rash.  Neurological:     Mental Status: She is alert and oriented to person, place, and time.     ED Results / Procedures / Treatments   Labs (all labs ordered are listed, but only abnormal results are displayed) Labs Reviewed  CBC WITH DIFFERENTIAL/PLATELET - Abnormal; Notable for the following components:      Result Value   Neutro Abs 8.3 (*)    All other components within normal limits  COMPREHENSIVE METABOLIC PANEL  TROPONIN I (HIGH SENSITIVITY)  TROPONIN I (HIGH SENSITIVITY)    EKG EKG Interpretation  Date/Time:  Saturday May 03 2022 10:55:23 EDT Ventricular Rate:  87 PR Interval:  166 QRS Duration: 127 QT Interval:  366 QTC Calculation: 441 R Axis:   -74 Text Interpretation: Sinus rhythm Probable left atrial enlargement Nonspecific IVCD with LAD Left ventricular hypertrophy Anterior infarct, old No significant change since last tracing Confirmed by Alvira Monday ([id number]) on 05/03/2022 9:08:20 PM  Radiology CT T-SPINE NO CHARGE  Result Date: 05/03/2022 CLINICAL DATA:  Right-sided thoracic pain. Intermittent numbness in her right arm. EXAM: CT THORACIC SPINE WITHOUT CONTRAST TECHNIQUE: Multidetector CT images of the thoracic were obtained using the standard protocol without intravenous contrast. RADIATION DOSE REDUCTION: This exam was performed according to the departmental dose-optimization program which includes automated exposure control,  adjustment of the mA and/or kV according to patient size and/or use of iterative reconstruction technique. COMPARISON:  None Available. FINDINGS: Alignment: Mild exaggerated kyphosis of the thoracic spine. Vertebrae: No acute fracture or focal pathologic process. Paraspinal and other soft tissues: Negative. Disc levels: Mostly preserved disc spaces. Minimal spondylosis. Large right-sided osteophytes at T9-T10 and T10-T11. IMPRESSION: 1. No fracture or static subluxation. 2. Minimal spondylosis. 3. Large right-sided osteophytes at T9-T10 and T10-T11. Electronically Signed   By: Ted Mcalpine M.D.   On: 05/03/2022 13:26   CT Angio Chest/Abd/Pel for Dissection W and/or Wo Contrast  Result Date: 05/03/2022 CLINICAL DATA:  Acute aortic syndrome suspected. Stabbing right-sided chest pain. EXAM: CT ANGIOGRAPHY CHEST, ABDOMEN AND PELVIS TECHNIQUE: Non-contrast CT of the chest was initially obtained. Multidetector CT imaging  through the chest, abdomen and pelvis was performed using the standard protocol during bolus administration of intravenous contrast. Multiplanar reconstructed images and MIPs were obtained and reviewed to evaluate the vascular anatomy. RADIATION DOSE REDUCTION: This exam was performed according to the departmental dose-optimization program which includes automated exposure control, adjustment of the mA and/or kV according to patient size and/or use of iterative reconstruction technique. CONTRAST:  OMNIPAQUE IOHEXOL 350 MG/ML SOLN COMPARISON:  February 21, 2008 FINDINGS: CTA CHEST FINDINGS Cardiovascular: Preferential opacification of the thoracic aorta. No evidence of thoracic aortic aneurysm or dissection. Normal heart size. No pericardial effusion. Mildly ectatic aorta. Mediastinum/Nodes: No enlarged mediastinal, hilar, or axillary lymph nodes. Thyroid gland, trachea, and esophagus demonstrate no significant findings. Lungs/Pleura: Cylindrical bronchiectasis in bilateral lower lobes  with mild subpleural streaky opacities which may represent atelectasis or chronic interstitial lung changes. No focal airspace consolidation. Musculoskeletal: Large right-sided osteophytes at T9-T10 and T10-T11. Review of the MIP images confirms the above findings. CTA ABDOMEN AND PELVIS FINDINGS VASCULAR Aorta: Normal caliber aorta without aneurysm, dissection, vasculitis or significant stenosis. Minimal atherosclerotic disease. Celiac: Patent without evidence of aneurysm, dissection, vasculitis or significant stenosis. SMA: Patent without evidence of aneurysm, dissection, vasculitis or significant stenosis. Renals: Both renal arteries are patent without evidence of aneurysm, dissection, vasculitis, fibromuscular dysplasia or significant stenosis. IMA: Patent without evidence of aneurysm, dissection, vasculitis or significant stenosis. Inflow: Patent without evidence of aneurysm, dissection, vasculitis or significant stenosis. Veins: No obvious venous abnormality within the limitations of this arterial phase study. Review of the MIP images confirms the above findings. NON-VASCULAR Hepatobiliary: No focal liver abnormality is seen. No gallstones, gallbladder wall thickening, or biliary dilatation. Pancreas: Unremarkable. No pancreatic ductal dilatation or surrounding inflammatory changes. Spleen: Normal in size without focal abnormality. Adrenals/Urinary Tract: Adrenal glands are unremarkable. Kidneys are normal, without renal calculi, focal lesion, or hydronephrosis. Bladder is unremarkable. Stomach/Bowel: Stomach is within normal limits. Appendix appears normal. No evidence of bowel wall thickening, distention, or inflammatory changes. Scattered left colonic diverticulosis. No evidence of diverticulitis. Lymphatic: No evidence of lymphadenopathy. Reproductive: Uterus and bilateral adnexa are unremarkable. Other: No abdominal wall hernia or abnormality. No abdominopelvic ascites. Musculoskeletal: No acute or  significant osseous findings. Review of the MIP images confirms the above findings. IMPRESSION: 1. No evidence of thoracic or abdominal aortic aneurysm or dissection. 2. Minimal atherosclerotic disease of the abdominal aorta. 3. Cylindrical bronchiectasis in bilateral lower lobes with mild subpleural streaky opacities which may represent atelectasis or chronic interstitial lung changes. 4. No evidence of acute abnormalities within the abdomen or pelvis. 5. Scattered left colonic diverticulosis without evidence of diverticulitis. 6. Large right-sided osteophytes at T9-T10 and T10-T11, which could potentially explain patient's pain. Electronically Signed   By: Ted Mcalpine M.D.   On: 05/03/2022 13:24    Procedures Procedures    Medications Ordered in ED Medications  fentaNYL (SUBLIMAZE) injection 50 mcg (50 mcg Intravenous Given 05/03/22 1126)  iohexol (OMNIPAQUE) 350 MG/ML injection 100 mL (100 mLs Intravenous Contrast Given 05/03/22 1247)    ED Course/ Medical Decision Making/ A&P                             Medical Decision Making Amount and/or Complexity of Data Reviewed Labs: ordered. Radiology: ordered.  Risk Prescription drug management.    77 year old female with a history of hypertension, Sjogren's, arthritis, right hip pain who presents with concern for stabbing back pain.  Differential diagnosis for  chest pain/back pain includes pulmonary embolus, dissection, pneumothorax, pneumonia, ACS, myocarditis, pericarditis, thoracic spine fracture, other spinal pathology/nerve irritation.    EKG was done and evaluate by me and showed no acute ST changes and no signs of pericarditis.     Reports some intermittent numbness/tingling in the right arm, without any other neurologic symptoms, has normal strength and sensation on exam, and have low suspicion for CVA, or acute spinal emergency requiring surgery at this time.  Given nature of stabbing pain with radiation to the chest,  will order dissection study for further evaluation.  Did consider ordering PE study given history of travel, but she is not having shortness of breath, no tachycardia or hypoxia, and feel that larger PE would be detected on dissection study, and with right arm symptoms feel it is prudent to evaluate arterial bolus timing.  Labs obtained and personally evaluated and interpreted by me show no significant anemia, electrolyte abnormalities, troponin negative x 2 doubt ACS.   CTA shows no evidence of dissection, shows bilateral lower lobes cylindrical bronchiectasis which may be atelectasis or chronic interstitial changes, large right-sided osteophytes at T9-T10-11.  Osteophytes may cause pain on right side however does seem that pain is more superior than this area.  Discussed possible other disc disease versus muscular pain.  Discussed pain control, reviewed in [place] drug database. Given flexeril, rx for oxycodone, recommend tylenol and ibuprofen and gave rx for medrol dose pack. Patient discharged in stable condition with understanding of reasons to return.         Final Clinical Impression(s) / ED Diagnoses Final diagnoses:  Acute right-sided thoracic back pain  Chest pain, unspecified type    Rx / DC Orders ED Discharge Orders          Ordered    oxyCODONE (ROXICODONE) 5 MG immediate release tablet  Every 4 hours PRN        05/03/22 1439    cyclobenzaprine (FLEXERIL) 10 MG tablet  2 times daily PRN        05/03/22 1439    methylPREDNISolone (MEDROL DOSEPAK) 4 MG TBPK tablet        05/03/22 1439              Alvira Monday, MD 05/03/22 2116

## 2022-05-03 NOTE — ED Notes (Signed)
RN reviewed discharge instructions with pt. Pt verbalized understanding and had no further questions. VSS upon discharge.  

## 2022-05-05 DIAGNOSIS — H2511 Age-related nuclear cataract, right eye: Secondary | ICD-10-CM | POA: Diagnosis not present

## 2022-05-06 DIAGNOSIS — H2512 Age-related nuclear cataract, left eye: Secondary | ICD-10-CM | POA: Diagnosis not present

## 2022-05-07 ENCOUNTER — Telehealth: Payer: Self-pay

## 2022-05-07 NOTE — Telephone Encounter (Signed)
Transition Care Management Follow-up Telephone Call Date of discharge and from where: 05/03/2022, [facility]. How have you been since you were released from the hospital? Patient is feeling a little better. Any questions or concerns? No  Items Reviewed: Did the pt receive and understand the discharge instructions provided? Yes  Medications obtained and verified? Yes  Other? No  Any new allergies since your discharge? No  Dietary orders reviewed? Yes Do you have support at home? Yes   Follow up appointments reviewed:  PCP Hospital f/u appt confirmed? Yes  Scheduled to see Gershon Crane MD on 05/09/2022 @ 11:00am [facility] Specialist Hospital f/u appt confirmed? No  Scheduled to see  on  @ . Are transportation arrangements needed? No  If their condition worsens, is the pt aware to call PCP or go to the Emergency Dept.? Yes Was the patient provided with contact information for the PCP's office or ED? Yes Was to pt encouraged to call back with questions or concerns? Yes  Charolett Yarrow [facility]  [facility] Population Health Community Resource Care Guide   [email or web address]  ?? [phone]   Website: [email or web address]

## 2022-05-09 ENCOUNTER — Encounter: Payer: Self-pay | Admitting: Family Medicine

## 2022-05-09 ENCOUNTER — Ambulatory Visit (INDEPENDENT_AMBULATORY_CARE_PROVIDER_SITE_OTHER): Payer: Medicare Other | Admitting: Family Medicine

## 2022-05-09 VITALS — BP 128/76 | HR 77 | Temp 99.1°F | Wt 150.6 lb

## 2022-05-09 DIAGNOSIS — M546 Pain in thoracic spine: Secondary | ICD-10-CM | POA: Diagnosis not present

## 2022-05-09 NOTE — Progress Notes (Signed)
   Subjective:    Patient ID: ILIA SPROULL, female    DOB: 22-Jun-1945, 77 y.o.   MRN: [id number]  HPI Here for a transitional care visit after an ED visit on 05-03-22 for right sided upper back pain with numbness in the right arm. This started after she returned home from a 200 mile car drive. She had no SOB. At the ED her labs were normal, and EKG was normal. A CT of the thoracic spine showed diffuse degeneration of the discs as well as large osteophytes at T9-T10 and T10-T11.  A CT angiogram of the chest, abdomin, and pelvis were unremarkable. She was told the bone spurs were the source of her pain, and she was prescribed a Medrol dose pack, Flexeril, and Oxycodone. She says she never got these filled however because she is scheduled to have a cataract surgery on the left eye on 05-19-22, and she does not want to do anything that could interfere with the surgery. Actually the pain has improved a lot since then, and now she is only taking Tylenol and taking hot showers .   Review of Systems  Constitutional: Negative.   Respiratory: Negative.    Cardiovascular: Negative.   Musculoskeletal:  Positive for back pain.       Objective:   Physical Exam Constitutional:      General: She is not in acute distress.    Appearance: Normal appearance.  Cardiovascular:     Rate and Rhythm: Normal rate and regular rhythm.     Pulses: Normal pulses.     Heart sounds: Normal heart sounds.  Pulmonary:     Effort: Pulmonary effort is normal.     Breath sounds: Normal breath sounds.  Musculoskeletal:     Comments: She is tender in the upper back between the spine and the right scapula. ROM of the spine is full   Neurological:     Mental Status: She is alert.           Assessment & Plan:  She is having thoracic back pain that is unrelated to the bone spurs seen on her recent Xrays. It appears to be healing on its own. She will continue to use heat and Ibuprofen as needed. Follow up as needed.   Gershon Crane, MD

## 2022-05-11 ENCOUNTER — Other Ambulatory Visit: Payer: Self-pay | Admitting: Family Medicine

## 2022-05-12 DIAGNOSIS — Z9841 Cataract extraction status, right eye: Secondary | ICD-10-CM | POA: Diagnosis not present

## 2022-05-12 DIAGNOSIS — Z961 Presence of intraocular lens: Secondary | ICD-10-CM | POA: Diagnosis not present

## 2022-05-12 DIAGNOSIS — H2511 Age-related nuclear cataract, right eye: Secondary | ICD-10-CM | POA: Diagnosis not present

## 2022-05-19 DIAGNOSIS — H2512 Age-related nuclear cataract, left eye: Secondary | ICD-10-CM | POA: Diagnosis not present

## 2022-05-26 DIAGNOSIS — Z9841 Cataract extraction status, right eye: Secondary | ICD-10-CM | POA: Diagnosis not present

## 2022-05-26 DIAGNOSIS — Z9842 Cataract extraction status, left eye: Secondary | ICD-10-CM | POA: Diagnosis not present

## 2022-05-26 DIAGNOSIS — Z961 Presence of intraocular lens: Secondary | ICD-10-CM | POA: Diagnosis not present

## 2022-05-26 DIAGNOSIS — H2512 Age-related nuclear cataract, left eye: Secondary | ICD-10-CM | POA: Diagnosis not present

## 2022-07-19 ENCOUNTER — Other Ambulatory Visit: Payer: Self-pay

## 2022-07-19 ENCOUNTER — Ambulatory Visit (HOSPITAL_COMMUNITY)
Admission: EM | Admit: 2022-07-19 | Discharge: 2022-07-19 | Disposition: A | Discharge status: Home / Self Care | Payer: Medicare Other | Attending: Physician Assistant | Admitting: Physician Assistant

## 2022-07-19 ENCOUNTER — Encounter (HOSPITAL_COMMUNITY): Payer: Self-pay | Admitting: Emergency Medicine

## 2022-07-19 DIAGNOSIS — R051 Acute cough: Secondary | ICD-10-CM | POA: Diagnosis not present

## 2022-07-19 DIAGNOSIS — Z1152 Encounter for screening for COVID-19: Secondary | ICD-10-CM | POA: Diagnosis not present

## 2022-07-19 DIAGNOSIS — J069 Acute upper respiratory infection, unspecified: Secondary | ICD-10-CM | POA: Diagnosis not present

## 2022-07-19 DIAGNOSIS — J029 Acute pharyngitis, unspecified: Secondary | ICD-10-CM | POA: Diagnosis present

## 2022-07-19 MED ORDER — BENZONATATE 100 MG PO CAPS: 100.0000 mg | ORAL_CAPSULE | Freq: Three times a day (TID) | ORAL | 0 refills | Status: DC

## 2022-07-19 NOTE — ED Provider Notes (Signed)
[facility]    CSN: [id number] Arrival date & time: 07/19/22  1316      History   Chief Complaint Chief Complaint  Patient presents with   URI    HPI Christy Moore is a 77 y.o. female.   Patient presents today with a 2-day history of URI symptoms.  Reports sore throat, frontal headache, sinus congestion, scratchy throat, cough.  Denies any fever, chest pain, shortness of breath, nausea, vomiting.  She does report that she was around someone who was also coughing before her symptoms began.  She has never had COVID in the past.  She has had all COVID vaccines.  She has taken Tylenol, Flonase, Vicks with minimal improvement of symptoms.  Denies any recent antibiotics or steroids.  She is eating and drinking normally.  Denies any history of asthma, COPD, smoking.  She does have seasonal allergies but reports this generally occurs in the fall and current symptoms are not similar to previous episodes of this condition.    Past Medical History:  Diagnosis Date   Arthritis    Cardiomegaly    Depression    GERD (gastroesophageal reflux disease)    Hypertension    Hypothyroidism    Seizures (HCC)    Sinus tachycardia    Sjogren's syndrome (HCC)    Status post dilation of esophageal narrowing 2012   Dr. Kinnie Scales    Patient Active Problem List   Diagnosis Date Noted   Depression 07/13/2020   Thrush 11/03/2012   Sjogren's disease (HCC) 01/22/2012   GERD (gastroesophageal reflux disease) 03/19/2011   Hypertension 03/19/2011   Raynaud's disease 03/19/2011   Cardiomegaly 02/26/2010   Hypothyroidism 02/26/2010   Wrist fracture, left 02/26/2010   Liver hemangioma 02/26/2010   Migraine 02/26/2010   Esophageal stricture 02/26/2010   Weight loss 02/26/2010   OTHER CHRONIC SINUSITIS 12/20/2007   Rosacea 12/20/2007    Past Surgical History:  Procedure Laterality Date   HAND TENDON SURGERY  02/09/2007   tendon transfer-Dr. Amanda Pea   NASAL SEPTUM SURGERY     operated on  by Dr. Haroldine Laws 20 years ago   OVARIAN CYST REMOVAL     Right Sided Salpingo-oopherectomy   SALPINGOOPHORECTOMY     WISDOM TOOTH EXTRACTION      OB History   No obstetric history on file.      Home Medications    Prior to Admission medications   Medication Sig Start Date End Date Taking? Authorizing Provider  benzonatate (TESSALON) 100 MG capsule Take 1 capsule (100 mg total) by mouth every 8 (eight) hours. 07/19/22  Yes Ramonita Koenig K, PA-C  amLODipine (NORVASC) 5 MG tablet Take 1 tablet (5 mg total) by mouth daily. 11/08/21   Alver Sorrow, NP  cyclobenzaprine (FLEXERIL) 10 MG tablet Take 1 tablet (10 mg total) by mouth 2 (two) times daily as needed for muscle spasms. Patient not taking: Reported on 07/19/2022 05/03/22   Alvira Monday, MD  escitalopram (LEXAPRO) 5 MG tablet TAKE 1 TABLET (5 MG TOTAL) BY MOUTH DAILY. 05/12/22   Burchette, Elberta Fortis, MD  famotidine (PEPCID) 40 MG tablet TAKE 1 TABLET BY MOUTH EVERYDAY AT BEDTIME 04/21/22   Philip Aspen, Limmie Patricia, MD  fluticasone [facility]) 50 MCG/ACT nasal spray Place 2 sprays into both nostrils daily as needed for allergies.    [provider]  gatifloxacin (ZYMAXID) 0.5 % SOLN Place 1 drop into the right eye 4 (four) times daily. Patient not taking: Reported on 07/19/2022 05/06/22  [provider]  levothyroxine (SYNTHROID) 100 MCG tablet TAKE 1 TABLET BY MOUTH EVERY DAY 02/05/22   Burchette, Elberta Fortis, MD  meloxicam (MOBIC) 7.5 MG tablet Take 7.5 mg by mouth daily.    [provider]  Polyethyl Glycol-Propyl Glycol (SYSTANE) 0.4-0.3 % SOLN Apply 1 drop to eye. Patient not taking: Reported on 07/19/2022    [provider]  sodium chloride (OCEAN) 0.65 % nasal spray Place 1 spray into the nose as needed for congestion.    [provider]  VEVYE 0.1 % SOLN Apply 1 drop to eye 2 (two) times daily. 03/20/22   [provider]    Family History Family History  Problem Relation Age of  Onset   Cancer Mother        breast and lung   Stroke Mother 30   Hypothyroidism Mother    Cancer Father        prostate   Heart attack Father 70   Alcohol abuse Maternal Grandmother    Alcohol abuse Maternal Grandfather    Allergies Other        Grandmother-pt states she takes after grandmother who had multiple allergies   Colon cancer Neg Hx    Esophageal cancer Neg Hx    Stomach cancer Neg Hx    Rectal cancer Neg Hx     Social History Social History   Tobacco Use   Smoking status: Never   Smokeless tobacco: Never  Vaping Use   Vaping status: Never Used  Substance Use Topics   Alcohol use: Yes    Comment: occasional   Drug use: No     Allergies   Amoxicillin-pot clavulanate, Azithromycin, Celecoxib, Ciprofloxacin, Doxycycline, Erythromycin, Hydrocodone, Hydrocodone-acetaminophen, Levofloxacin, Omeprazole, Penicillins, Propoxyphene n-acetaminophen, Rofecoxib, Sulfa antibiotics, and Tetanus toxoid   Review of Systems Review of Systems  Constitutional:  Positive for activity change. Negative for appetite change, fatigue and fever.  HENT:  Positive for congestion and sore throat. Negative for sinus pressure and sneezing.   Respiratory:  Positive for cough. Negative for shortness of breath.   Cardiovascular:  Negative for chest pain.  Gastrointestinal:  Negative for abdominal pain, diarrhea, nausea and vomiting.  Neurological:  Positive for headaches. Negative for dizziness and light-headedness.     Physical Exam Triage Vital Signs ED Triage Vitals  Encounter Vitals Group     BP 07/19/22 1407 125/84     Systolic BP Percentile --      Diastolic BP Percentile --      Pulse Rate 07/19/22 1407 99     Resp 07/19/22 1407 18     Temp 07/19/22 1407 99.6 F (37.6 C)     Temp Source 07/19/22 1407 Oral     SpO2 07/19/22 1407 95 %     Weight --      Height --      Head Circumference --      Peak Flow --      Pain Score 07/19/22 1403 3     Pain Loc --      Pain  Education --      Exclude from Growth Chart --    No data found.  Updated Vital Signs BP 125/84 (BP Location: Right Arm)   Pulse 99   Temp 99.6 F (37.6 C) (Oral)   Resp 18   SpO2 95%   Visual Acuity Right Eye Distance:   Left Eye Distance:   Bilateral Distance:    Right Eye Near:   Left Eye Near:  Bilateral Near:     Physical Exam Vitals reviewed.  Constitutional:      General: She is awake. She is not in acute distress.    Appearance: Normal appearance. She is well-developed. She is not ill-appearing.     Comments: Very pleasant female appears stated age in no acute distress sitting comfortably in exam room  HENT:     Head: Normocephalic and atraumatic.     Right Ear: Tympanic membrane, ear canal and external ear normal. Tympanic membrane is not erythematous or bulging.     Left Ear: Tympanic membrane, ear canal and external ear normal. Tympanic membrane is not erythematous or bulging.     Nose:     Right Sinus: No maxillary sinus tenderness or frontal sinus tenderness.     Left Sinus: No maxillary sinus tenderness or frontal sinus tenderness.     Mouth/Throat:     Pharynx: Uvula midline. Posterior oropharyngeal erythema present. No oropharyngeal exudate.     Comments: Erythema and drainage present posterior oropharynx Cardiovascular:     Rate and Rhythm: Normal rate and regular rhythm.     Heart sounds: Normal heart sounds, S1 normal and S2 normal. No murmur heard. Pulmonary:     Effort: Pulmonary effort is normal.     Breath sounds: Normal breath sounds. No wheezing, rhonchi or rales.     Comments: Clear to auscultation bilaterally Psychiatric:        Behavior: Behavior is cooperative.      UC Treatments / Results  Labs (all labs ordered are listed, but only abnormal results are displayed) Labs Reviewed  SARS CORONAVIRUS 2 (TAT 6-24 HRS)    EKG   Radiology No results found.  Procedures Procedures (including critical care time)  Medications  Ordered in UC Medications - No data to display  Initial Impression / Assessment and Plan / UC Course  I have reviewed the triage vital signs and the nursing notes.  Pertinent labs & imaging results that were available during my care of the patient were reviewed by me and considered in my medical decision making (see chart for details).     Patient is well-appearing, afebrile, nontoxic, nontachycardic.  No evidence of acute infection on physical exam there which initiation of antibiotics.  Concern for viral etiology.  She was ordered COVID and these results are pending.  We will contact her if they are positive.  Given her age she is a candidate for antiviral therapy.  Her last metabolic panel was 05/03/2022 with creatinine of 0.78 and EGFR over 60.  She would need to hold her amlodipine and cyclobenzaprine while on this medication and restart 3 days after completing course.  Will treat symptomatically with Tessalon Perles.  She can use over-the-counter medications including Mucinex, Flonase, Tylenol.  Discussed that she should rest and drink plenty of fluid.  If her symptoms not proving within a week she is to return for reevaluation.  If she has any worsening symptoms including worsening cough, chest pain, shortness of breath, fever, weakness, nausea, vomiting she needs to be seen immediately.  Strict return precautions given.  Final Clinical Impressions(s) / UC Diagnoses   Final diagnoses:  Upper respiratory tract infection, unspecified type  Acute cough     Discharge Instructions      I believe you have a virus.  I will contact you if you are positive for COVID.  Monitor MyChart for results.  Continue with over-the-counter medications including Tylenol and Vicks VapoRub.  I have called in [facility]  to help with your cough.  Make sure that you rest and drink plenty of fluid.  Follow-up with your primary care next week if your symptoms have not improved.  If anything worsens and you have  worsening cough, shortness of breath, nausea/vomiting, fever, weakness, chest pain you need to be seen immediately.      ED Prescriptions     Medication Sig Dispense Auth. Provider   benzonatate (TESSALON) 100 MG capsule Take 1 capsule (100 mg total) by mouth every 8 (eight) hours. 21 capsule Maranatha Grossi K, PA-C      PDMP not reviewed this encounter.   Jeani Hawking, PA-C 07/19/22 1439

## 2022-07-19 NOTE — ED Notes (Signed)
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

## 2022-07-19 NOTE — ED Triage Notes (Signed)
Symptoms started 2 days ago.  Initially had a headache and a hacking cough.  Today sore throat.    Took tylenol last night and applied vicks

## 2022-07-19 NOTE — Discharge Instructions (Signed)
I believe you have a virus.  I will contact you if you are positive for COVID.  Monitor MyChart for results.  Continue with over-the-counter medications including Tylenol and Vicks VapoRub.  I have called in Tessalon to help with your cough.  Make sure that you rest and drink plenty of fluid.  Follow-up with your primary care next week if your symptoms have not improved.  If anything worsens and you have worsening cough, shortness of breath, nausea/vomiting, fever, weakness, chest pain you need to be seen immediately.

## 2022-07-20 LAB — SARS CORONAVIRUS 2 (TAT 6-24 HRS): SARS Coronavirus 2: NEGATIVE

## 2022-07-22 ENCOUNTER — Encounter: Payer: Self-pay | Admitting: Family Medicine

## 2022-07-22 ENCOUNTER — Ambulatory Visit (INDEPENDENT_AMBULATORY_CARE_PROVIDER_SITE_OTHER): Payer: Medicare Other | Admitting: Family Medicine

## 2022-07-22 VITALS — BP 120/78 | HR 92 | Temp 99.1°F | Wt 151.8 lb

## 2022-07-22 DIAGNOSIS — J40 Bronchitis, not specified as acute or chronic: Secondary | ICD-10-CM | POA: Diagnosis not present

## 2022-07-22 MED ORDER — CEFUROXIME AXETIL 500 MG PO TABS: 500.0000 mg | ORAL_TABLET | Freq: Two times a day (BID) | ORAL | 0 refills | Status: AC

## 2022-07-22 MED ORDER — BENZONATATE 100 MG PO CAPS: 100.0000 mg | ORAL_CAPSULE | Freq: Four times a day (QID) | ORAL | 0 refills | Status: DC | PRN

## 2022-07-22 NOTE — Progress Notes (Signed)
   Subjective:    Patient ID: Christy Moore, female    DOB: 1945/06/22, 77 y.o.   MRN: [id number]  HPI Here for 10 days of stuffy head, PND, and coughing up yellow sputum. No SOB or chest pain. She has had low grade fevers. She was seen at urgent care on 07-19-22 and she was diagnosed with a viral URI. She tested negative for Covid-19. She was given Benzonatate for the cough, and this helps somewhat.    Review of Systems  Constitutional:  Positive for fever.  HENT:  Positive for congestion and postnasal drip. Negative for ear pain, sinus pain and sore throat.   Eyes: Negative.   Respiratory:  Positive for cough. Negative for shortness of breath and wheezing.        Objective:   Physical Exam Constitutional:      Appearance: Normal appearance. She is not ill-appearing.  HENT:     Right Ear: Tympanic membrane, ear canal and external ear normal.     Left Ear: Tympanic membrane, ear canal and external ear normal.     Nose: Nose normal.     Mouth/Throat:     Pharynx: Oropharynx is clear.  Eyes:     Conjunctiva/sclera: Conjunctivae normal.  Pulmonary:     Breath sounds: Rhonchi present. No wheezing or rales.  Lymphadenopathy:     Cervical: No cervical adenopathy.  Neurological:     Mental Status: She is alert.           Assessment & Plan:  Bronchitis, treat with 10 days of Cefuroxime. Recheck as needed.  Gershon Crane, MD

## 2022-07-31 ENCOUNTER — Other Ambulatory Visit: Payer: Self-pay | Admitting: Family Medicine

## 2022-08-05 ENCOUNTER — Encounter: Payer: Self-pay | Admitting: Family Medicine

## 2022-08-05 ENCOUNTER — Ambulatory Visit (INDEPENDENT_AMBULATORY_CARE_PROVIDER_SITE_OTHER): Payer: Medicare Other | Admitting: Family Medicine

## 2022-08-05 DIAGNOSIS — R059 Cough, unspecified: Secondary | ICD-10-CM

## 2022-08-05 NOTE — Patient Instructions (Signed)
Schedule CXR for tomorrow.

## 2022-08-05 NOTE — Progress Notes (Signed)
Established Patient Office Visit  Subjective   Patient ID: Christy Moore, female    DOB: [birth year]  Age: 77 y.o. MRN: [id number]  Chief Complaint  Patient presents with   Sinusitis    Patient complains of sinusitis, x3 weeks, Tried Benzonotate   Cough    Patient complains of cough, x3 weeks, Productive with yellow sputum   Diarrhea    Patient complains of diarrhea, x3 weeks   Sore Throat     Patient complains of sore throat, x3 week    HPI   Christy Moore is seen with persistent cough for about 3 weeks now.  She is non-smoker and no chronic lung issues.  She went to urgent care on the 13th and was subsequently seen here a few days later on the 16th and placed on Tessalon and cefuroxime for 7 days.  She has had some occasional loose stools but no watery diarrhea.  Questionable wheezing intermittently.  No fever.  No dyspnea.  Some general fatigue.  Intermittent mild sore throat.  Occasional cough productive of yellow sputum.  No hemoptysis.  No appetite or weight changes.  Past Medical History:  Diagnosis Date   Arthritis    Cardiomegaly    Depression    GERD (gastroesophageal reflux disease)    Hypertension    Hypothyroidism    Seizures (HCC)    Sinus tachycardia    Sjogren's syndrome (HCC)    Status post dilation of esophageal narrowing 2012   Dr. Kinnie Scales   Past Surgical History:  Procedure Laterality Date   BASAL CELL CARCINOMA EXCISION  2024   CATARACT EXTRACTION, BILATERAL  2024   HAND TENDON SURGERY  02/09/2007   tendon transfer-Dr. Amanda Pea   NASAL SEPTUM SURGERY     operated on by Dr. Haroldine Laws 20 years ago   OVARIAN CYST REMOVAL     Right Sided Salpingo-oopherectomy   SALPINGOOPHORECTOMY     WISDOM TOOTH EXTRACTION      reports that she has never smoked. She has never used smokeless tobacco. She reports current alcohol use. She reports that she does not use drugs. family history includes Alcohol abuse in her maternal grandfather and maternal grandmother;  Allergies in an other family member; Cancer in her father and mother; Heart attack (age of onset: 43) in her father; Hypothyroidism in her mother; Stroke (age of onset: 47) in her mother. Allergies  Allergen Reactions   Amoxicillin-Pot Clavulanate     REACTION: nausea   Azithromycin     REACTION: nausea, Gi   Celecoxib     REACTION: nausea   Ciprofloxacin     REACTION: nausea   Doxycycline     REACTION: gi upset   Erythromycin     REACTION: nause, GI   Hydrocodone     REACTION: nausea   Hydrocodone-Acetaminophen     REACTION: GI upset   Levofloxacin     REACTION: she claims renal failiure   Omeprazole Nausea And Vomiting   Penicillins    Propoxyphene N-Acetaminophen    Rofecoxib     REACTION: nausea   Sulfa Antibiotics    Tetanus Toxoid     Review of Systems  Constitutional:  Negative for chills and fever.  Respiratory:  Positive for cough and sputum production. Negative for hemoptysis and shortness of breath.   Cardiovascular:  Negative for chest pain.      Objective:     There were no vitals taken for this visit. BP Readings from Last 3 Encounters:  07/22/22 120/78  07/19/22 125/84  05/09/22 128/76   Wt Readings from Last 3 Encounters:  07/22/22 151 lb 12.8 oz (68.9 kg)  05/09/22 150 lb 9.6 oz (68.3 kg)  05/03/22 150 lb 9.6 oz (68.3 kg)      Physical Exam Vitals reviewed.  Constitutional:      General: She is not in acute distress.    Appearance: She is not ill-appearing.  HENT:     Right Ear: Tympanic membrane normal.     Left Ear: Tympanic membrane normal.     Mouth/Throat:     Mouth: Mucous membranes are moist.     Pharynx: Oropharynx is clear.  Cardiovascular:     Rate and Rhythm: Normal rate and regular rhythm.  Pulmonary:     Effort: Pulmonary effort is normal.     Breath sounds: Normal breath sounds. No wheezing or rales.  Neurological:     Mental Status: She is alert.      No results found for any visits on 08/05/22.    The  ASCVD Risk score (Arnett DK, et al., 2019) failed to calculate for the following reasons:   Cannot find a previous HDL lab   Cannot find a previous total cholesterol lab    Assessment & Plan:   Problem List Items Addressed This Visit   None Visit Diagnoses     Cough, unspecified type    -  Primary   Relevant Orders   DG Chest 2 View     Persistent cough.  Patient does not feel any worse but no better than she did prior to starting antibiotics recently.  Does not have any red flag such as fever, weight loss, or hemoptysis. -Obtain PA and lateral chest x-ray to further assess -Follow-up immediately for any fever, dyspnea, or other concerns -She will return tomorrow for x-ray as our x-ray technician already left today when she was here for visit  No follow-ups on file.    Evelena Peat, MD

## 2022-08-06 ENCOUNTER — Other Ambulatory Visit: Payer: Medicare Other

## 2022-08-06 ENCOUNTER — Ambulatory Visit: Payer: Medicare Other

## 2022-08-06 DIAGNOSIS — R059 Cough, unspecified: Secondary | ICD-10-CM | POA: Diagnosis not present

## 2022-08-07 ENCOUNTER — Telehealth: Payer: Self-pay | Admitting: Family Medicine

## 2022-08-07 NOTE — Telephone Encounter (Signed)
Pt stated that she has not had any feer. Patient also notified of message below and verbalized understanding.

## 2022-08-07 NOTE — Telephone Encounter (Signed)
Spoke to pt and advised that X-ray has not been resulted. Pt verbalized understanding but is concerned bc its been 2 days and her chest is rattling.

## 2022-08-07 NOTE — Telephone Encounter (Signed)
Pt requesting a call to discuss results from her chest xray. Says her chest is rattling when she coughs

## 2022-08-13 MED ORDER — CEFUROXIME AXETIL 500 MG PO TABS: 500.0000 mg | ORAL_TABLET | Freq: Two times a day (BID) | ORAL | 0 refills | Status: AC

## 2022-08-26 DIAGNOSIS — H18413 Arcus senilis, bilateral: Secondary | ICD-10-CM | POA: Diagnosis not present

## 2022-08-26 DIAGNOSIS — H02831 Dermatochalasis of right upper eyelid: Secondary | ICD-10-CM | POA: Diagnosis not present

## 2022-08-26 DIAGNOSIS — H26493 Other secondary cataract, bilateral: Secondary | ICD-10-CM | POA: Diagnosis not present

## 2022-08-26 DIAGNOSIS — H26491 Other secondary cataract, right eye: Secondary | ICD-10-CM | POA: Diagnosis not present

## 2022-08-26 DIAGNOSIS — Z961 Presence of intraocular lens: Secondary | ICD-10-CM | POA: Diagnosis not present

## 2022-08-27 ENCOUNTER — Other Ambulatory Visit: Payer: Self-pay | Admitting: Family Medicine

## 2022-09-03 DIAGNOSIS — H2511 Age-related nuclear cataract, right eye: Secondary | ICD-10-CM | POA: Diagnosis not present

## 2022-09-03 DIAGNOSIS — Z961 Presence of intraocular lens: Secondary | ICD-10-CM | POA: Diagnosis not present

## 2022-09-03 DIAGNOSIS — Z9841 Cataract extraction status, right eye: Secondary | ICD-10-CM | POA: Diagnosis not present

## 2022-09-11 DIAGNOSIS — M1812 Unilateral primary osteoarthritis of first carpometacarpal joint, left hand: Secondary | ICD-10-CM | POA: Diagnosis not present

## 2022-09-18 DIAGNOSIS — H26492 Other secondary cataract, left eye: Secondary | ICD-10-CM | POA: Diagnosis not present

## 2022-09-26 DIAGNOSIS — H2512 Age-related nuclear cataract, left eye: Secondary | ICD-10-CM | POA: Diagnosis not present

## 2022-10-16 ENCOUNTER — Other Ambulatory Visit: Payer: Self-pay | Admitting: Internal Medicine

## 2022-10-16 DIAGNOSIS — K219 Gastro-esophageal reflux disease without esophagitis: Secondary | ICD-10-CM

## 2022-10-21 ENCOUNTER — Encounter: Payer: Self-pay | Admitting: Family Medicine

## 2022-10-21 ENCOUNTER — Ambulatory Visit: Payer: Medicare Other | Admitting: Family Medicine

## 2022-10-21 VITALS — BP 120/70 | HR 88 | Temp 98.2°F | Ht 64.5 in | Wt 154.3 lb

## 2022-10-21 DIAGNOSIS — E038 Other specified hypothyroidism: Secondary | ICD-10-CM

## 2022-10-21 DIAGNOSIS — I10 Essential (primary) hypertension: Secondary | ICD-10-CM | POA: Diagnosis not present

## 2022-10-21 DIAGNOSIS — M5431 Sciatica, right side: Secondary | ICD-10-CM | POA: Diagnosis not present

## 2022-10-21 MED ORDER — AMLODIPINE BESYLATE 5 MG PO TABS: 5.0000 mg | ORAL_TABLET | Freq: Every day | ORAL | 3 refills | Status: DC

## 2022-10-21 NOTE — Patient Instructions (Signed)
Set up labs for one day next week.

## 2022-10-21 NOTE — Progress Notes (Signed)
Established Patient Office Visit  Subjective   Patient ID: Christy Moore, female    DOB: 1945-08-02  Age: 77 y.o. MRN: [id number]  Chief Complaint  Patient presents with   Medical Management of Chronic Issues   Hip Pain   Back Pain    HPI   Christy Moore seen for medical follow-up.  She has hypothyroidism which is treated with levothyroxine.  Due for follow-up labs.  She has hypertension treated with amlodipine 5 mg daily.  Generally stays fairly active.  She has had some recent issues with right lower back pain radiating into the buttock and occasionally down toward the thigh.  Has had similar sciatica type issues in the past.  No numbness or weakness.  Took some ibuprofen and symptoms improved at this time.  Denies any recent fall or injury.  Past Medical History:  Diagnosis Date   Arthritis    Cardiomegaly    Depression    GERD (gastroesophageal reflux disease)    Hypertension    Hypothyroidism    Seizures (HCC)    Sinus tachycardia    Sjogren's syndrome (HCC)    Status post dilation of esophageal narrowing 2012   Dr. Kinnie Scales   Past Surgical History:  Procedure Laterality Date   BASAL CELL CARCINOMA EXCISION  2024   CATARACT EXTRACTION, BILATERAL  2024   HAND TENDON SURGERY  02/09/2007   tendon transfer-Dr. Amanda Pea   NASAL SEPTUM SURGERY     operated on by Dr. Haroldine Laws 20 years ago   OVARIAN CYST REMOVAL     Right Sided Salpingo-oopherectomy   SALPINGOOPHORECTOMY     WISDOM TOOTH EXTRACTION      reports that she has never smoked. She has never used smokeless tobacco. She reports current alcohol use. She reports that she does not use drugs. family history includes Alcohol abuse in her maternal grandfather and maternal grandmother; Allergies in an other family member; Cancer in her father and mother; Heart attack (age of onset: 58) in her father; Hypothyroidism in her mother; Stroke (age of onset: 32) in her mother. Allergies  Allergen Reactions   Amoxicillin-Pot  Clavulanate     REACTION: nausea   Azithromycin     REACTION: nausea, Gi   Celecoxib     REACTION: nausea   Ciprofloxacin     REACTION: nausea   Doxycycline     REACTION: gi upset   Erythromycin     REACTION: nause, GI   Hydrocodone     REACTION: nausea   Hydrocodone-Acetaminophen     REACTION: GI upset   Levofloxacin     REACTION: she claims renal failiure   Omeprazole Nausea And Vomiting   Penicillins    Propoxyphene N-Acetaminophen    Rofecoxib     REACTION: nausea   Sulfa Antibiotics    Tetanus Toxoid     Review of Systems  Constitutional:  Positive for malaise/fatigue. Negative for chills, fever and weight loss.  Eyes:  Negative for blurred vision.  Respiratory:  Negative for shortness of breath.   Cardiovascular:  Negative for chest pain.  Gastrointestinal:  Negative for abdominal pain.  Musculoskeletal:        See HPI  Neurological:  Negative for dizziness, weakness and headaches.      Objective:     BP 120/70 (BP Location: Left Arm, Patient Position: Sitting, Cuff Size: Normal)   Pulse 88   Temp 98.2 F (36.8 C) (Oral)   Ht 5' 4.5" (1.638 m)   Wt 154 lb 4.8 oz (  70 kg)   SpO2 97%   BMI 26.08 kg/m  BP Readings from Last 3 Encounters:  10/21/22 120/70  07/22/22 120/78  07/19/22 125/84   Wt Readings from Last 3 Encounters:  10/21/22 154 lb 4.8 oz (70 kg)  07/22/22 151 lb 12.8 oz (68.9 kg)  05/09/22 150 lb 9.6 oz (68.3 kg)      Physical Exam Vitals reviewed.  Constitutional:      Appearance: Normal appearance.  Cardiovascular:     Rate and Rhythm: Normal rate and regular rhythm.  Pulmonary:     Effort: Pulmonary effort is normal.     Breath sounds: Normal breath sounds. No wheezing or rales.  Musculoskeletal:     Right lower leg: No edema.     Left lower leg: No edema.     Comments: Good range of motion right hip.  Negative straight leg raise on the right.  Minimal right lateral hip tenderness over the bursa region  Neurological:      Mental Status: She is alert.     Comments: .  Full strength lower extremity.      No results found for any visits on 10/21/22.  Last CBC Lab Results  Component Value Date   WBC 9.9 05/03/2022   HGB 14.3 05/03/2022   HCT 42.7 05/03/2022   MCV 90.5 05/03/2022   MCH 30.3 05/03/2022   RDW 13.9 05/03/2022   PLT 197 05/03/2022   Last metabolic panel Lab Results  Component Value Date   GLUCOSE 98 05/03/2022   NA 139 05/03/2022   K 3.8 05/03/2022   CL 102 05/03/2022   CO2 28 05/03/2022   BUN 14 05/03/2022   CREATININE 0.78 05/03/2022   GFRNONAA >60 05/03/2022   CALCIUM 9.5 05/03/2022   PROT 7.8 05/03/2022   ALBUMIN 4.3 05/03/2022   BILITOT 0.9 05/03/2022   ALKPHOS 53 05/03/2022   AST 17 05/03/2022   ALT 9 05/03/2022   ANIONGAP 9 05/03/2022   Last lipids Lab Results  Component Value Date   CHOL 163 02/14/2015   HDL 52.70 02/14/2015   LDLCALC 89 02/14/2015   TRIG 106.0 02/14/2015   CHOLHDL 3 02/14/2015   Last thyroid functions Lab Results  Component Value Date   TSH 0.852 06/27/2021      The ASCVD Risk score (Arnett DK, et al., 2019) failed to calculate for the following reasons:   Cannot find a previous HDL lab   Cannot find a previous total cholesterol lab    Assessment & Plan:   #1 hypothyroidism.  Patient on levothyroxine 100 mcg daily.  Due for follow-up labs.  Our lab was closed today.  Future lab order placed.  She will return next week for that  #2 hypertension well-controlled on amlodipine 5 mg daily.  Send in 1 year refill of amlodipine to her pharmacy.  #3 recent right low back pain with sciatica type symptoms.  Resolved after couple days of ibuprofen.  Follow-up for any recurrent symptoms.   No follow-ups on file.    Evelena Peat, MD

## 2022-11-04 ENCOUNTER — Other Ambulatory Visit: Payer: Self-pay | Admitting: Family Medicine

## 2022-11-05 ENCOUNTER — Other Ambulatory Visit (INDEPENDENT_AMBULATORY_CARE_PROVIDER_SITE_OTHER): Payer: Medicare Other

## 2022-11-05 ENCOUNTER — Ambulatory Visit: Payer: Medicare Other

## 2022-11-05 VITALS — BP 118/62 | HR 81 | Temp 98.6°F | Ht 64.5 in | Wt 155.6 lb

## 2022-11-05 DIAGNOSIS — E038 Other specified hypothyroidism: Secondary | ICD-10-CM | POA: Diagnosis not present

## 2022-11-05 DIAGNOSIS — Z0000 Encounter for general adult medical examination without abnormal findings: Secondary | ICD-10-CM

## 2022-11-05 NOTE — Patient Instructions (Addendum)
Ms. Whitehill , Thank you for taking time to come for your Medicare Wellness Visit. I appreciate your ongoing commitment to your health goals. Please review the following plan we discussed and let me know if I can assist you in the future.   Referrals/Orders/Follow-Ups/Clinician Recommendations:   This is a list of the screening recommended for you and due dates:  Health Maintenance  Topic Date Due   DTaP/Tdap/Td vaccine (1 - Tdap) Never done   Flu Shot  08/07/2022   Mammogram  08/17/2022   COVID-19 Vaccine (6 - 2023-24 season) 09/07/2022   Medicare Annual Wellness Visit  11/05/2023   Colon Cancer Screening  08/14/2030   Pneumonia Vaccine  Completed   DEXA scan (bone density measurement)  Completed   Hepatitis C Screening  Completed   HPV Vaccine  Aged Out   Zoster (Shingles) Vaccine  Discontinued    Advanced directives: (Copy Requested) Please bring a copy of your health care power of attorney and living will to the office to be added to your chart at your convenience.  Next Medicare Annual Wellness Visit scheduled for next year: Yes

## 2022-11-05 NOTE — Progress Notes (Signed)
Subjective:   Christy Moore is a 77 y.o. female who presents for Medicare Annual (Subsequent) preventive examination.  Visit Complete: In person      Objective:    Today's Vitals   11/05/22 1346  BP: 118/62  Pulse: 81  Temp: 98.6 F (37 C)  TempSrc: Oral  SpO2: 97%  Weight: 155 lb 9.6 oz (70.6 kg)  Height: 5' 4.5" (1.638 m)   Body mass index is 26.3 kg/m.     11/05/2022    2:20 PM 05/03/2022    9:31 AM 12/21/2021    5:11 AM 10/18/2021   10:33 AM 10/17/2020    3:49 PM 08/13/2020    7:13 AM 10/17/2019    2:50 PM  Advanced Directives  Does Patient Have a Medical Advance Directive? Yes Yes No Yes Yes Yes Yes  Type of Estate agent of [place];Living will Living will  Healthcare Power of [place];Living will Healthcare Power of [place];Living will Healthcare Power of [place];Living will Healthcare Power of [place];Living will  Does patient want to make changes to medical advance directive?      No - Patient declined No - Patient declined  Copy of Healthcare Power of Attorney in Chart? No - copy requested   No - copy requested No - copy requested No - copy requested No - copy requested  Would patient like information on creating a medical advance directive?   No - Patient declined        Current Medications (verified) Outpatient Encounter Medications as of 11/05/2022  Medication Sig   amLODipine (NORVASC) 5 MG tablet Take 1 tablet (5 mg total) by mouth daily.   cyclobenzaprine (FLEXERIL) 10 MG tablet Take 1 tablet (10 mg total) by mouth 2 (two) times daily as needed for muscle spasms.   escitalopram (LEXAPRO) 5 MG tablet TAKE 1 TABLET (5 MG TOTAL) BY MOUTH DAILY.   famotidine (PEPCID) 40 MG tablet TAKE 1 TABLET BY MOUTH EVERYDAY AT BEDTIME   fluticasone (FLONASE) 50 MCG/ACT nasal spray Place 2 sprays into both nostrils daily as needed for allergies.   gatifloxacin (ZYMAXID) 0.5 % SOLN Place 1 drop into the right eye 4 (four) times daily.    levothyroxine (SYNTHROID) 100 MCG tablet TAKE 1 TABLET BY MOUTH EVERY DAY   meloxicam (MOBIC) 7.5 MG tablet Take 7.5 mg by mouth daily.   Polyethyl Glycol-Propyl Glycol (SYSTANE) 0.4-0.3 % SOLN Apply 1 drop to eye.   sodium chloride (OCEAN) 0.65 % nasal spray Place 1 spray into the nose as needed for congestion.   VEVYE 0.1 % SOLN Apply 1 drop to eye 2 (two) times daily.   No facility-administered encounter medications on file as of 11/05/2022.    Allergies (verified) Amoxicillin-pot clavulanate, Azithromycin, Celecoxib, Ciprofloxacin, Doxycycline, Erythromycin, Hydrocodone, Hydrocodone-acetaminophen, Levofloxacin, Omeprazole, Penicillins, Propoxyphene n-acetaminophen, Rofecoxib, Sulfa antibiotics, and Tetanus toxoid   History: Past Medical History:  Diagnosis Date   Arthritis    Cardiomegaly    Depression    GERD (gastroesophageal reflux disease)    Hypertension    Hypothyroidism    Seizures (HCC)    Sinus tachycardia    Sjogren's syndrome (HCC)    Status post dilation of esophageal narrowing 2012   Dr. Kinnie Scales   Past Surgical History:  Procedure Laterality Date   BASAL CELL CARCINOMA EXCISION  2024   CATARACT EXTRACTION, BILATERAL  2024   HAND TENDON SURGERY  02/09/2007   tendon transfer-Dr. Amanda Pea   NASAL SEPTUM SURGERY     operated on by  Dr. Haroldine Laws 20 years ago   OVARIAN CYST REMOVAL     Right Sided Salpingo-oopherectomy   SALPINGOOPHORECTOMY     WISDOM TOOTH EXTRACTION     Family History  Problem Relation Age of Onset   Cancer Mother        breast and lung   Stroke Mother 13   Hypothyroidism Mother    Cancer Father        prostate   Heart attack Father 48   Alcohol abuse Maternal Grandmother    Alcohol abuse Maternal Grandfather    Allergies Other        Grandmother-pt states she takes after grandmother who had multiple allergies   Colon cancer Neg Hx    Esophageal cancer Neg Hx    Stomach cancer Neg Hx    Rectal cancer Neg Hx    Social History    Socioeconomic History   Marital status: Married    Spouse name: Not on file   Number of children: Not on file   Years of education: Not on file   Highest education level: Some college, no degree  Occupational History   Not on file  Tobacco Use   Smoking status: Never   Smokeless tobacco: Never  Vaping Use   Vaping status: Never Used  Substance and Sexual Activity   Alcohol use: Yes    Comment: occasional   Drug use: No   Sexual activity: Not on file  Other Topics Concern   Not on file  Social History Narrative   Not on file   Social Determinants of Health   Financial Resource Strain: Low Risk  (11/05/2022)   Overall Financial Resource Strain (CARDIA)    Difficulty of Paying Living Expenses: Not hard at all  Food Insecurity: No Food Insecurity (11/05/2022)   Hunger Vital Sign    Worried About Running Out of Food in the Last Year: Never true    Ran Out of Food in the Last Year: Never true  Transportation Needs: No Transportation Needs (11/05/2022)   PRAPARE - Administrator, Civil Service (Medical): No    Lack of Transportation (Non-Medical): No  Physical Activity: Inactive (11/05/2022)   Exercise Vital Sign    Days of Exercise per Week: 0 days    Minutes of Exercise per Session: 0 min  Stress: No Stress Concern Present (11/05/2022)   [employer] of Occupational Health - Occupational Stress Questionnaire    Feeling of Stress : Not at all  Social Connections: Socially Integrated (11/05/2022)   Social Connection and Isolation Panel [NHANES]    Frequency of Communication with Friends and Family: More than three times a week    Frequency of Social Gatherings with Friends and Family: More than three times a week    Attends Religious Services: More than 4 times per year    Active Member of [employer] or Organizations: Yes    Attends Engineer, structural: More than 4 times per year    Marital Status: Married    Tobacco Counseling Counseling given:  Not Answered   Clinical Intake:  Pre-visit preparation completed: Yes  Pain : No/denies pain     BMI - recorded: 26.3 Nutritional Status: BMI 25 -29 Overweight Nutritional Risks: None Diabetes: No  How often do you need to have someone help you when you read instructions, pamphlets, or other written materials from your doctor or pharmacy?: 1 - Never  Interpreter Needed?: No  Information entered by :: Theresa Mulligan LPN  Activities of Daily Living    11/05/2022    2:18 PM  In your present state of health, do you have any difficulty performing the following activities:  Hearing? 0  Vision? 0  Difficulty concentrating or making decisions? 0  Walking or climbing stairs? 0  Dressing or bathing? 0  Doing errands, shopping? 0  Preparing Food and eating ? N  Using the Toilet? N  In the past six months, have you accidently leaked urine? N  Do you have problems with loss of bowel control? N  Managing your Medications? N  Managing your Finances? N  Housekeeping or managing your Housekeeping? N    Patient Care Team: Kristian Covey, MD as PCP - General (Family Medicine) Chilton Si, MD as PCP - Cardiology (Cardiology) Verner Chol, [facility] (Inactive) as Pharmacist (Pharmacist)  Indicate any recent Medical Services you may have received from other than [facility] providers in the past year (date may be approximate).     Assessment:   This is a routine wellness examination for Ela.  Hearing/Vision screen Hearing Screening - Comments:: Denies hearing difficulties   Vision Screening - Comments:: Wears rx glasses - up to date with routine eye exams with  [facility]   Goals Addressed               This Visit's Progress     Complete personal projects (pt-stated)         Depression Screen    11/05/2022    2:17 PM 10/21/2022    4:26 PM 05/09/2022   12:15 PM 12/24/2021    4:39 PM 11/20/2021   11:05 AM 10/21/2021    2:39 PM 10/18/2021   10:17 AM  PHQ  2/9 Scores  PHQ - 2 Score 0 2 0 0 0 2 2  PHQ- 9 Score 0 2 1 0 0 9 2    Fall Risk    11/05/2022    2:19 PM 05/09/2022   12:15 PM 05/05/2022   10:43 AM 11/20/2021   11:06 AM 10/21/2021    2:40 PM  Fall Risk   Falls in the past year? 1 0 0 0 0  Number falls in past yr: 1 0  0 0  Injury with Fall? 0 0  0 0  Risk for fall due to : No Fall Risks No Fall Risks  No Fall Risks No Fall Risks  Follow up Falls prevention discussed Falls evaluation completed  Falls evaluation completed Falls evaluation completed    MEDICARE RISK AT HOME: Medicare Risk at Home Any stairs in or around the home?: Yes If so, are there any without handrails?: No Home free of loose throw rugs in walkways, pet beds, electrical cords, etc?: Yes Adequate lighting in your home to reduce risk of falls?: Yes Life alert?: No Use of a cane, walker or w/c?: No Grab bars in the bathroom?: No Shower chair or bench in shower?: No Elevated toilet seat or a handicapped toilet?: Yes  TIMED UP AND GO:  Was the test performed?  Yes  Length of time to ambulate 10 feet: 10 sec Gait steady and fast without use of assistive device    Cognitive Function:    05/27/2017    9:43 AM  MMSE - Mini Mental State Exam  Not completed: --        11/05/2022    2:20 PM 10/18/2021   10:33 AM  6CIT Screen  What Year? 0 points 0 points  What month? 0 points 0  points  What time? 0 points 0 points  Count back from 20 0 points 0 points  Months in reverse 0 points 0 points  Repeat phrase 0 points 0 points  Total Score 0 points 0 points    Immunizations Immunization History  Administered Date(s) Administered   Fluad Quad(high Dose 65+) 09/16/2018, 10/03/2019, 09/27/2021   Influenza Split 11/22/2011   Influenza, High Dose Seasonal PF 09/14/2014, 09/07/2015, 10/07/2016, 10/26/2017, 09/16/2018, 09/21/2020   Influenza,inj,Quad PF,6+ Mos 10/05/2013   Influenza,inj,quad, With Preservative 10/06/2016, 11/02/2017   Influenza-Unspecified  11/22/2011, 10/05/2013, 09/14/2014, 09/07/2015, 10/06/2016, 10/07/2016, 10/26/2017, 11/02/2017   PFIZER(Purple Top)SARS-COV-2 Vaccination 02/11/2019, 03/08/2019, 10/04/2019, 04/20/2020   Pfizer Covid-19 Vaccine Bivalent Booster 71yrs & up 10/08/2020   Pneumococcal Conjugate-13 10/05/2013   Pneumococcal Polysaccharide-23 02/19/2015   Zoster Recombinant(Shingrix) 10/08/2020   Zoster, Live 01/17/2010    TDAP status: Due, Education has been provided regarding the importance of this vaccine. Advised may receive this vaccine at local pharmacy or Health Dept. Aware to provide a copy of the vaccination record if obtained from local pharmacy or Health Dept. Verbalized acceptance and understanding.  Flu Vaccine status: Due, Education has been provided regarding the importance of this vaccine. Advised may receive this vaccine at local pharmacy or Health Dept. Aware to provide a copy of the vaccination record if obtained from local pharmacy or Health Dept. Verbalized acceptance and understanding.  Pneumococcal vaccine status: Up to date  Covid-19 vaccine status: Declined, Education has been provided regarding the importance of this vaccine but patient still declined. Advised may receive this vaccine at local pharmacy or Health Dept.or vaccine clinic. Aware to provide a copy of the vaccination record if obtained from local pharmacy or Health Dept. Verbalized acceptance and understanding.    Screening Tests Health Maintenance  Topic Date Due   DTaP/Tdap/Td (1 - Tdap) Never done   INFLUENZA VACCINE  08/07/2022   MAMMOGRAM  08/17/2022   COVID-19 Vaccine (6 - 2023-24 season) 09/07/2022   Medicare Annual Wellness (AWV)  11/05/2023   Colonoscopy  08/14/2030   Pneumonia Vaccine 72+ Years old  Completed   DEXA SCAN  Completed   Hepatitis C Screening  Completed   HPV VACCINES  Aged Out   Zoster Vaccines- Shingrix  Discontinued    Health Maintenance  Health Maintenance Due  Topic Date Due    DTaP/Tdap/Td (1 - Tdap) Never done   INFLUENZA VACCINE  08/07/2022   MAMMOGRAM  08/17/2022   COVID-19 Vaccine (6 - 2023-24 season) 09/07/2022    Colorectal cancer screening: Type of screening: Colonoscopy. Completed 08/13/20. Repeat every 10 years  Mammogram status: Ordered Deferred. Pt provided with contact info and advised to call to schedule appt.   Bone Density status: Completed 06/25/20. Results reflect: Bone density results: OSTEOPOROSIS. Repeat every   years.  Additional Screening:  Hepatitis C Screening: does qualify; Completed 05/20/17  Vision Screening: Recommended annual ophthalmology exams for early detection of glaucoma and other disorders of the eye. Is the patient up to date with their annual eye exam?  Yes  Who is the provider or what is the name of the office in which the patient attends annual eye exams? [facility] If pt is not established with a provider, would they like to be referred to a provider to establish care? No .   Dental Screening: Recommended annual dental exams for proper oral hygiene    Community Resource Referral / Chronic Care Management:  CRR required this visit?  No   CCM required this visit?  No     Plan:     I have personally reviewed and noted the following in the patient's chart:   Medical and social history Use of alcohol, tobacco or illicit drugs  Current medications and supplements including opioid prescriptions. Patient is not currently taking opioid prescriptions. Functional ability and status Nutritional status Physical activity Advanced directives List of other physicians Hospitalizations, surgeries, and ER visits in previous 12 months Vitals Screenings to include cognitive, depression, and falls Referrals and appointments  In addition, I have reviewed and discussed with patient certain preventive protocols, quality metrics, and best practice recommendations. A written personalized care plan for preventive services as  well as general preventive health recommendations were provided to patient.     Tillie Rung, LPN   [id number]/29/5621   After Visit Summary: Given  Nurse Notes: None

## 2022-11-06 LAB — TSH: TSH: 0.74 u[IU]/mL (ref 0.35–5.50)

## 2022-11-19 ENCOUNTER — Ambulatory Visit (INDEPENDENT_AMBULATORY_CARE_PROVIDER_SITE_OTHER): Payer: Medicare Other | Admitting: Family Medicine

## 2022-11-19 VITALS — BP 120/78 | HR 102 | Temp 98.8°F | Wt 147.6 lb

## 2022-11-19 DIAGNOSIS — K5733 Diverticulitis of large intestine without perforation or abscess with bleeding: Secondary | ICD-10-CM

## 2022-11-19 MED ORDER — METRONIDAZOLE 500 MG PO TABS: 500.0000 mg | ORAL_TABLET | Freq: Three times a day (TID) | ORAL | 0 refills | Status: DC

## 2022-11-19 MED ORDER — ONDANSETRON HCL 8 MG PO TABS: 8.0000 mg | ORAL_TABLET | Freq: Four times a day (QID) | ORAL | 0 refills | Status: DC | PRN

## 2022-11-19 MED ORDER — CEPHALEXIN 500 MG PO CAPS: 500.0000 mg | ORAL_CAPSULE | Freq: Three times a day (TID) | ORAL | 0 refills | Status: AC

## 2022-11-19 NOTE — Progress Notes (Signed)
   Subjective:    Patient ID: Christy Moore, female    DOB: Nov 18, 1945, 77 y.o.   MRN: [id number]  HPI Here for the onset yesterday afternoon of intermittent lower abdominal pains. Then she began to pass mucus per her rectum that had tinges of bright red blood. She had numerous episodes through the night of passing blood tinged mucus and the abdominal cramps. She has been nauseated but has not vomited. No fever. Normally she has regular BM's with mushy stools, but she is constipated on occasion. Her last colonoscopy on 08-13-20 revealed some left sided diverticulosis but no hemorrhoids. She denies any urinary symptoms.    Review of Systems  Constitutional: Negative.   Respiratory: Negative.    Cardiovascular: Negative.   Gastrointestinal:  Positive for abdominal pain, blood in stool, constipation and nausea. Negative for abdominal distention, diarrhea, rectal pain and vomiting.  Genitourinary: Negative.        Objective:   Physical Exam Constitutional:      Appearance: Normal appearance. She is not ill-appearing.  Cardiovascular:     Rate and Rhythm: Normal rate and regular rhythm.     Pulses: Normal pulses.     Heart sounds: Normal heart sounds.  Pulmonary:     Effort: Pulmonary effort is normal.     Breath sounds: Normal breath sounds.  Abdominal:     General: Abdomen is flat. Bowel sounds are normal. There is no distension.     Palpations: Abdomen is soft. There is no mass.     Tenderness: There is no right CVA tenderness, left CVA tenderness, guarding or rebound.     Hernia: No hernia is present.     Comments: She is mildly tender in the left flank and LLQ   Neurological:     Mental Status: She is alert.           Assessment & Plan:  Diverticulitis, treat with 10 days of Keflex and Flagyl. Use Zofran as needed for nausea. Recheck if the pain gets worse or if a fever develops.  Gershon Crane, MD

## 2022-11-21 ENCOUNTER — Encounter: Payer: Self-pay | Admitting: Family Medicine

## 2022-11-23 ENCOUNTER — Other Ambulatory Visit: Payer: Self-pay | Admitting: Family Medicine

## 2022-11-24 DIAGNOSIS — Z85828 Personal history of other malignant neoplasm of skin: Secondary | ICD-10-CM | POA: Diagnosis not present

## 2022-11-24 DIAGNOSIS — L821 Other seborrheic keratosis: Secondary | ICD-10-CM | POA: Diagnosis not present

## 2022-11-24 DIAGNOSIS — D2262 Melanocytic nevi of left upper limb, including shoulder: Secondary | ICD-10-CM | POA: Diagnosis not present

## 2022-11-24 NOTE — Telephone Encounter (Signed)
I think the best thing to do is for her to come back in for a re-exam so we can determine the next steps (both Dr. Caryl Never and I have openings tomorrow)

## 2023-01-17 ENCOUNTER — Other Ambulatory Visit: Payer: Self-pay | Admitting: Family Medicine

## 2023-01-17 DIAGNOSIS — K219 Gastro-esophageal reflux disease without esophagitis: Secondary | ICD-10-CM

## 2023-02-21 ENCOUNTER — Other Ambulatory Visit: Payer: Self-pay | Admitting: Family Medicine

## 2023-02-23 NOTE — Telephone Encounter (Signed)
Shiny with [facility] informed of message below

## 2023-03-06 ENCOUNTER — Encounter (HOSPITAL_BASED_OUTPATIENT_CLINIC_OR_DEPARTMENT_OTHER): Payer: Self-pay | Admitting: *Deleted

## 2023-03-06 ENCOUNTER — Emergency Department (HOSPITAL_BASED_OUTPATIENT_CLINIC_OR_DEPARTMENT_OTHER)
Admission: EM | Admit: 2023-03-06 | Discharge: 2023-03-06 | Disposition: A | Discharge status: Home / Self Care | Payer: Medicare Other | Attending: Emergency Medicine | Admitting: Emergency Medicine

## 2023-03-06 ENCOUNTER — Ambulatory Visit: Payer: Self-pay | Admitting: Family Medicine

## 2023-03-06 ENCOUNTER — Other Ambulatory Visit: Payer: Self-pay

## 2023-03-06 ENCOUNTER — Emergency Department (HOSPITAL_BASED_OUTPATIENT_CLINIC_OR_DEPARTMENT_OTHER): Payer: Medicare Other | Admitting: Radiology

## 2023-03-06 DIAGNOSIS — Z79899 Other long term (current) drug therapy: Secondary | ICD-10-CM | POA: Insufficient documentation

## 2023-03-06 DIAGNOSIS — J168 Pneumonia due to other specified infectious organisms: Secondary | ICD-10-CM | POA: Diagnosis not present

## 2023-03-06 DIAGNOSIS — M546 Pain in thoracic spine: Secondary | ICD-10-CM | POA: Insufficient documentation

## 2023-03-06 DIAGNOSIS — J181 Lobar pneumonia, unspecified organism: Secondary | ICD-10-CM | POA: Insufficient documentation

## 2023-03-06 DIAGNOSIS — I10 Essential (primary) hypertension: Secondary | ICD-10-CM | POA: Diagnosis not present

## 2023-03-06 DIAGNOSIS — M549 Dorsalgia, unspecified: Secondary | ICD-10-CM | POA: Diagnosis not present

## 2023-03-06 DIAGNOSIS — R0602 Shortness of breath: Secondary | ICD-10-CM | POA: Diagnosis present

## 2023-03-06 DIAGNOSIS — J984 Other disorders of lung: Secondary | ICD-10-CM | POA: Diagnosis not present

## 2023-03-06 DIAGNOSIS — J189 Pneumonia, unspecified organism: Secondary | ICD-10-CM

## 2023-03-06 LAB — CBC
HCT: 43 % (ref 36.0–46.0)
Hemoglobin: 14.3 g/dL (ref 12.0–15.0)
MCH: 30.4 pg (ref 26.0–34.0)
MCHC: 33.3 g/dL (ref 30.0–36.0)
MCV: 91.5 fL (ref 80.0–100.0)
Platelets: 211 10*3/uL (ref 150–400)
RBC: 4.7 MIL/uL (ref 3.87–5.11)
RDW: 13.8 % (ref 11.5–15.5)
WBC: 9.1 10*3/uL (ref 4.0–10.5)
nRBC: 0 % (ref 0.0–0.2)

## 2023-03-06 LAB — TROPONIN I (HIGH SENSITIVITY)
Troponin I (High Sensitivity): 14 ng/L (ref <18)
Troponin I (High Sensitivity): 14 ng/L (ref <18)

## 2023-03-06 LAB — BASIC METABOLIC PANEL
Anion gap: 8 (ref 5–15)
BUN: 19 mg/dL (ref 8–23)
CO2: 26 mmol/L (ref 22–32)
Calcium: 9.5 mg/dL (ref 8.9–10.3)
Chloride: 105 mmol/L (ref 98–111)
Creatinine, Ser: 0.92 mg/dL (ref 0.44–1.00)
GFR, Estimated: >60 mL/min (ref >60)
Glucose, Bld: 104 mg/dL — ABNORMAL HIGH (ref 70–99)
Potassium: 4.1 mmol/L (ref 3.5–5.1)
Sodium: 139 mmol/L (ref 135–145)

## 2023-03-06 MED ORDER — AMOXICILLIN-POT CLAVULANATE 875-125 MG PO TABS: 1.0000 | ORAL_TABLET | Freq: Two times a day (BID) | ORAL | 0 refills | Status: DC

## 2023-03-06 MED ORDER — AZITHROMYCIN 250 MG PO TABS: 250.0000 mg | ORAL_TABLET | Freq: Every day | ORAL | 0 refills | Status: DC

## 2023-03-06 NOTE — Telephone Encounter (Signed)
  Chief Complaint: Cough Symptoms: cough, pain in back, "flutter after coughing", SOB Frequency: began last night Pertinent Negatives: Patient denies CP, fever, NVD Disposition: [x] ED /[] Urgent Care (no appt availability in office) / [] Appointment(In office/virtual)/ []  [facility]/ [] Home Care/ [] Refused Recommended Disposition /[] [facility]/ []  Follow-up with PCP Additional Notes: Patient calls reporting worsening cough, pain in back, increased SOB at rest that began last night. States the SOB is constant, even at rest. Per protocol, patient to present to ED now for evaluation based of symptoms.  Patient agreeable to plan and states she will go to [facility], states her husband will take her. Care advice reviewed, patient verbalized understanding and denies further questions at this time.  Alerting PCP for review.    Copied from CRM [id number]. Topic: Clinical - Red Word Triage >> Mar 06, 2023  8:12 AM Hector Shade B wrote: [facility] that prompted transfer to Nurse Triage: Patient stating SOB, back pain and severe coughing when she attempts to breath and a irregular hr on breathing. Reason for Disposition  [1] MODERATE difficulty breathing (e.g., speaks in phrases, SOB even at rest, pulse 100-120) AND [2] still present when not coughing  Answer Assessment - Initial Assessment Questions 1. ONSET: "When did the cough begin?"      Last night, began with sharp pain in back 2. SEVERITY: "How bad is the cough today?"      Rattling, non productive, worse with deep breath 3. SPUTUM: "Describe the color of your sputum" (none, dry cough; clear, white, yellow, green)     Yellow 4. HEMOPTYSIS: "Are you coughing up any blood?" If so ask: "How much?" (flecks, streaks, tablespoons, etc.)     Denies 5. DIFFICULTY BREATHING: "Are you having difficulty breathing?" If Yes, ask: "How bad is it?" (e.g., mild, moderate, severe)    - MILD: No SOB at rest, mild SOB with walking, speaks normally in  sentences, can lie down, no retractions, pulse < 100.    - MODERATE: SOB at rest, SOB with minimal exertion and prefers to sit, cannot lie down flat, speaks in phrases, mild retractions, audible wheezing, pulse 100-120.    - SEVERE: Very SOB at rest, speaks in single words, struggling to breathe, sitting hunched forward, retractions, pulse > 120      Moderate, pretty constant 6. FEVER: "Do you have a fever?" If Yes, ask: "What is your temperature, how was it measured, and when did it start?"     Denies 7. CARDIAC HISTORY: "Do you have any history of heart disease?" (e.g., heart attack, congestive heart failure)      Denies 8. LUNG HISTORY: "Do you have any history of lung disease?"  (e.g., pulmonary embolus, asthma, emphysema)     Denies 9. PE RISK FACTORS: "Do you have a history of blood clots?" (or: recent major surgery, recent prolonged travel, bedridden)     Denies 10. OTHER SYMPTOMS: "Do you have any other symptoms?" (e.g., runny nose, wheezing, chest pain)       Back pain, feels an extra beat in heart sometimes 11. PREGNANCY: "Is there any chance you are pregnant?" "When was your last menstrual period?"       NA 12. TRAVEL: "Have you traveled out of the country in the last month?" (e.g., travel history, exposures)       Denies  Protocols used: Cough - Acute Non-Productive-A-AH

## 2023-03-06 NOTE — ED Provider Notes (Signed)
 [place] [facility]MENT AT [facility] Provider Note   CSN: [id number] Arrival date & time: 03/06/23  2440     History  Chief Complaint  Patient presents with   Back Pain    Christy Moore is a 78 y.o. female.   Back Pain Patient is a 78 year old female presents the ED today complaining of a 1 day history of central thoracic back pain that radiates to ribs bilaterally.  States that pain is worse with deep inspiration and has been accompanied with a 1 month history of cough/congestion with mild shortness of breath.  Denies fever, visual changes,  abdominal pain, nausea, vomiting, diarrhea, dysuria, lower extremity swelling.      Home Medications Prior to Admission medications   Medication Sig Start Date End Date Taking? Authorizing Provider  amoxicillin-clavulanate (AUGMENTIN) 875-125 MG tablet Take 1 tablet by mouth every 12 (twelve) hours. 03/06/23  Yes Lunette Stands, PA-C  azithromycin (ZITHROMAX) 250 MG tablet Take 1 tablet (250 mg total) by mouth daily. Take first 2 tablets together, then 1 every day until finished. 03/06/23  Yes Lunette Stands, PA-C  amLODipine (NORVASC) 5 MG tablet Take 1 tablet (5 mg total) by mouth daily. 10/21/22   Burchette, Elberta Fortis, MD  cyclobenzaprine (FLEXERIL) 10 MG tablet Take 1 tablet (10 mg total) by mouth 2 (two) times daily as needed for muscle spasms. 05/03/22   Alvira Monday, MD  escitalopram (LEXAPRO) 5 MG tablet TAKE 1 TABLET (5 MG TOTAL) BY MOUTH DAILY. 11/04/22   Burchette, Elberta Fortis, MD  famotidine (PEPCID) 40 MG tablet TAKE 1 TABLET BY MOUTH EVERYDAY AT BEDTIME 01/19/23   Burchette, Elberta Fortis, MD  fluticasone (FLONASE) 50 MCG/ACT nasal spray Place 2 sprays into both nostrils daily as needed for allergies.    [provider]  gatifloxacin (ZYMAXID) 0.5 % SOLN Place 1 drop into the right eye 4 (four) times daily. 05/06/22   [provider]  levothyroxine (SYNTHROID) 100 MCG tablet TAKE 1 TABLET BY MOUTH EVERY  DAY 11/24/22   Burchette, Elberta Fortis, MD  meloxicam (MOBIC) 7.5 MG tablet Take 7.5 mg by mouth daily.    [provider]  metroNIDAZOLE (FLAGYL) 500 MG tablet Take 1 tablet (500 mg total) by mouth 3 (three) times daily. 11/19/22   Nelwyn Salisbury, MD  ondansetron (ZOFRAN) 8 MG tablet Take 1 tablet (8 mg total) by mouth every 6 (six) hours as needed for nausea or vomiting. 11/19/22   Nelwyn Salisbury, MD  Polyethyl Glycol-Propyl Glycol (SYSTANE) 0.4-0.3 % SOLN Apply 1 drop to eye.    [provider]  sodium chloride (OCEAN) 0.65 % nasal spray Place 1 spray into the nose as needed for congestion.    [provider]  VEVYE 0.1 % SOLN Apply 1 drop to eye 2 (two) times daily. 03/20/22   [provider]      Allergies    Amoxicillin-pot clavulanate, Azithromycin, Celecoxib, Ciprofloxacin, Doxycycline, Erythromycin, Hydrocodone, Hydrocodone-acetaminophen, Levofloxacin, Omeprazole, Penicillins, Propoxyphene n-acetaminophen, Rofecoxib, Sulfa antibiotics, and Tetanus toxoid    Review of Systems   Review of Systems  Musculoskeletal:  Positive for back pain.  All other systems reviewed and are negative.   Physical Exam Updated Vital Signs BP (!) 140/73 (BP Location: Right Arm)   Pulse 86   Temp 98.4 F (36.9 C) (Oral)   Resp 16   SpO2 97%  Physical Exam Vitals and nursing note reviewed.  Constitutional:      Appearance: Normal appearance.  HENT:     Head: Normocephalic and atraumatic.     Mouth/Throat:     Mouth: Mucous membranes are moist.     Pharynx: Oropharynx is clear. No oropharyngeal exudate or posterior oropharyngeal erythema.  Eyes:     Extraocular Movements: Extraocular movements intact.     Conjunctiva/sclera: Conjunctivae normal.  Cardiovascular:     Rate and Rhythm: Normal rate and regular rhythm.     Pulses: Normal pulses.     Heart sounds: Normal heart sounds. No murmur heard.    No friction rub. No gallop.  Pulmonary:     Effort: Pulmonary  effort is normal. No respiratory distress.     Breath sounds: No stridor. Rales (L sided low lobar rales noted) present. No wheezing or rhonchi.  Abdominal:     General: Abdomen is flat.     Palpations: Abdomen is soft.     Tenderness: There is no abdominal tenderness. There is no right CVA tenderness or left CVA tenderness.  Musculoskeletal:        General: No swelling or tenderness.     Right lower leg: No edema.     Left lower leg: No edema.  Skin:    General: Skin is warm and dry.     Coloration: Skin is not jaundiced or pale.     Findings: No bruising or erythema.  Neurological:     General: No focal deficit present.     Mental Status: She is alert. Mental status is at baseline.  Psychiatric:        Mood and Affect: Mood normal.     ED Results / Procedures / Treatments   Labs (all labs ordered are listed, but only abnormal results are displayed) Labs Reviewed  BASIC METABOLIC PANEL - Abnormal; Notable for the following components:      Result Value   Glucose, Bld 104 (*)    All other components within normal limits  CBC  TROPONIN I (HIGH SENSITIVITY)  TROPONIN I (HIGH SENSITIVITY)    EKG EKG Interpretation Date/Time:  Friday March 06 2023 09:36:50 EST Ventricular Rate:  89 PR Interval:  160 QRS Duration:  128 QT Interval:  384 QTC Calculation: 467 R Axis:   -77  Text Interpretation: Sinus rhythm with Premature atrial complexes Left axis deviation Left ventricular hypertrophy with QRS widening and repolarization abnormality ( R in aVL , Cornell product , Romhilt-Estes ) Cannot rule out Septal infarct , age undetermined Abnormal ECG When compared with ECG of 03-May-2022 10:55, PREVIOUS ECG IS PRESENT since last tracing no significant change Confirmed by Rolan Bucco [phone]) on 03/06/2023 2:30:08 PM  Radiology DG Chest 2 View Result Date: 03/06/2023 CLINICAL DATA:  Back pain EXAM: CHEST - 2 VIEW COMPARISON:  08/06/2022 FINDINGS: Patchy airspace opacities at the  left lung base, obscuring the diaphragmatic leaflet. Possible small left pleural effusion. Right lung clear. Heart size and mediastinal contours are within normal limits. Aortic Atherosclerosis (ICD10-170.0). Visualized bones unremarkable. IMPRESSION: Left basilar airspace disease and possible  effusion. Electronically Signed   By: Corlis Leak M.D.   On: 03/06/2023 11:09    Procedures Procedures    Medications Ordered in ED Medications - No data to display  ED Course/ Medical Decision Making/ A&P                                 Medical Decision Making Amount and/or Complexity of Data Reviewed Labs: ordered. Radiology: ordered.  This patient is a 78 year old female who presents to the ED for concern of thoracic back pain radiating to both ribs x 1 day with increased shortness of breath.   Differential diagnoses prior to evaluation: The emergent differential diagnosis includes, but is not limited to, ACS, PE, pneumonia, bronchitis, muscle strain, pyelonephritis, UTI.. This is not an exhaustive differential.   Past Medical History / Co-morbidities / Social History: Sjogren syndrome, cardiomegaly, liver hemangioma, esophageal stricture, GERD, HTN  Additional history: Chart reviewed. Pertinent results include: Last seen on 11/21/2022 for diverticulitis treated with Keflex and Flagyl.  Lab Tests/Imaging studies: I personally interpreted labs/imaging and the pertinent results include:   CBC unremarkable CMP unremarkable Troponin unremarkable Chest xray shows left basilar pneumonia. I agree with the radiologist interpretation.  Cardiac monitoring: EKG obtained and interpreted by myself and attending physician which shows: Sinus rhythm with premature atrial complexes.  EKG Interpretation Date/Time:  Friday March 06 2023 09:36:50 EST Ventricular Rate:  89 PR Interval:  160 QRS Duration:  128 QT Interval:  384 QTC Calculation: 467 R Axis:   -77  Text Interpretation: Sinus  rhythm with Premature atrial complexes Left axis deviation Left ventricular hypertrophy with QRS widening and repolarization abnormality ( R in aVL , Cornell product , Romhilt-Estes ) Cannot rule out Septal infarct , age undetermined Abnormal ECG When compared with ECG of 03-May-2022 10:55, PREVIOUS ECG IS PRESENT since last tracing no significant change Confirmed by Rolan Bucco [phone]) on 03/06/2023 2:30:08 PM          Medications: I ordered medication including azithromycin and Augmentin at home.  I have reviewed the patients home medicines and have made adjustments as needed.  ED Course:   Patient is a 37-year-old female presents the ED complaining of 1 day history of central thoracic back pain radiating to both sides of her ribs bilaterally.  States that she has also been feeling mild shortness of breath within 1 month history of cough/congestion.  Says she noticed the back pain whenever she was lifting her heavy [place] oven and putting it into the oven.  Afebrile, no acute distress.  Physical exam was notable for left-sided basilar rales.  With point tenderness over paraspinal muscles of the thoracic spine bilaterally.  X-ray did show a left basilar pneumonia.  With labs including troponin were otherwise unremarkable.  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  Will recommend that she continue to take Tylenol for pain relief as well as use Salonpas pads.  Believe that the pain is most likely due to muscle strain.  Will prescribe Augmentin and azithromycin.  Patient says that she was allergic to these things however when further questioning, she says that they gave her stomach upset with no signs of anaphylaxis including no rashes, no swelling, no throat swelling.  I believe this would be safe to treat with current symptoms.  With strict return to ED precautions.  I believe this patient safe to discharge and followed up outpatient  and would return to the ED if any new or worsening symptoms are present.   Disposition: After consideration of the diagnostic results and the patients response to treatment, I feel that the patient benefit from discharge and treatment as above.   emergency department workup does not suggest an emergent condition requiring admission or immediate intervention beyond what has been performed at this time. The plan is: Azithromycin and Augmentin for pneumonia, follow-up with PCP, return for  any new or worsening symptoms. The patient is safe for discharge and has been instructed to return immediately for worsening symptoms, change in symptoms or any other concerns.   Final Clinical Impression(s) / ED Diagnoses Final diagnoses:  Pneumonia of left lower lobe due to infectious organism    Rx / DC Orders ED Discharge Orders          Ordered    azithromycin (ZITHROMAX) 250 MG tablet  Daily        03/06/23 1600    amoxicillin-clavulanate (AUGMENTIN) 875-125 MG tablet  Every 12 hours        03/06/23 1600              Baldo Ash [place], [place] 03/06/23 1811    Rolan Bucco, MD 03/07/23 0700

## 2023-03-06 NOTE — Telephone Encounter (Signed)
 Agree with advice given  Christy Covey MD [name] at [facility]

## 2023-03-06 NOTE — ED Triage Notes (Signed)
 Pt is here for middle back pain which began last pm, not associated with any trauma.  Pt has been having sob with this.

## 2023-03-06 NOTE — Discharge Instructions (Addendum)
 You were seen today for left lumbar pneumonia.  I am prescribing you both Augmentin and azithromycin.  You are to take these until completion.  You were noted to have allergies to these however these seem to be more side effects from the medication rather than with allergies.  However if you do to begin to develop any rash, shortness of breath, throat swelling, return to the ED for immediate evaluation for anaphylaxis.  Also return to the ED if you begin to have worsening shortness of breath, worsening chest pain despite treatment.  You can take Tylenol for further pain relief at this time.  Take Tylenol (acetominophen)  650mg  every 4-6 hours, as needed for pain or fever. Do not take more than 4,000 mg in a 24-hour period. As this may cause liver damage. While this is rare, if you begin to develop yellowing of the skin or eyes, stop taking and return to ER immediately.  Also recommend that you follow-up with PCP for reevaluation after antibiotic course completion.  It was a pleasure seeing you in the ED today.

## 2023-03-10 ENCOUNTER — Encounter: Payer: Self-pay | Admitting: Family Medicine

## 2023-03-10 ENCOUNTER — Ambulatory Visit (INDEPENDENT_AMBULATORY_CARE_PROVIDER_SITE_OTHER): Admitting: Family Medicine

## 2023-03-10 VITALS — BP 120/76 | HR 85 | Temp 98.4°F | Ht 64.5 in | Wt 154.0 lb

## 2023-03-10 DIAGNOSIS — R053 Chronic cough: Secondary | ICD-10-CM

## 2023-03-10 DIAGNOSIS — R052 Subacute cough: Secondary | ICD-10-CM | POA: Diagnosis not present

## 2023-03-10 DIAGNOSIS — J189 Pneumonia, unspecified organism: Secondary | ICD-10-CM | POA: Diagnosis not present

## 2023-03-10 NOTE — Patient Instructions (Signed)
 Set up repeat CXR in about 3 weeks.

## 2023-03-10 NOTE — Progress Notes (Signed)
 Established Patient Office Visit  Subjective   Patient ID: Christy Moore, female    DOB: [birth year]  Age: 78 y.o. MRN: [id number]  Chief Complaint  Patient presents with   Hospitalization Follow-up    HPI   Christy Moore is seen for ER follow up.  She states about a week ago she had onset of central thoracic back pain radiating somewhat to the ribs bilaterally.  This was especially noted a couple days after lifting a heavy [place].  She had lifted this Tuesday night and had some tightness in her back afterwards and pain progressed especially by Thursday.  By Friday she noticed some pain with deep breathing and at that point went to the ER for further evaluation.  She related about a month history of some intermittent cough and possibly mild shortness of breath but no fever, nausea, vomiting.  No lower extremity swelling or pain.  Denies any exertional chest pain.  Lab work including CBC and troponins unremarkable.  Blood chemistries unremarkable.  EKG showed premature atrial complex with LVH changes and some QRS widening which was chronic and unchanged.  Chest x-ray showed question of left lower lobe pneumonia.  It was also noted that she had some rales in her left base.  She was placed on Zithromax and amoxicillin.  Pain is slightly better today.  Still no fever.  No chills or night sweats.  She has never smoked.  Past Medical History:  Diagnosis Date   Arthritis    Cardiomegaly    Depression    GERD (gastroesophageal reflux disease)    Hypertension    Hypothyroidism    Seizures (HCC)    Sinus tachycardia    Sjogren's syndrome (HCC)    Status post dilation of esophageal narrowing 2012   Dr. Kinnie Scales   Past Surgical History:  Procedure Laterality Date   BASAL CELL CARCINOMA EXCISION  2024   CATARACT EXTRACTION, BILATERAL  2024   HAND TENDON SURGERY  02/09/2007   tendon transfer-Dr. Amanda Pea   NASAL SEPTUM SURGERY     operated on by Dr. Haroldine Laws 20 years ago   OVARIAN CYST  REMOVAL     Right Sided Salpingo-oopherectomy   SALPINGOOPHORECTOMY     WISDOM TOOTH EXTRACTION      reports that she has never smoked. She has never used smokeless tobacco. She reports current alcohol use. She reports that she does not use drugs. family history includes Alcohol abuse in her maternal grandfather and maternal grandmother; Allergies in an other family member; Cancer in her father and mother; Heart attack (age of onset: 70) in her father; Hypothyroidism in her mother; Stroke (age of onset: 40) in her mother. Allergies  Allergen Reactions   Amoxicillin-Pot Clavulanate     REACTION: nausea   Azithromycin     REACTION: nausea, Gi   Celecoxib     REACTION: nausea   Ciprofloxacin     REACTION: nausea   Doxycycline     REACTION: gi upset   Erythromycin     REACTION: nause, GI   Hydrocodone     REACTION: nausea   Hydrocodone-Acetaminophen     REACTION: GI upset   Levofloxacin     REACTION: she claims renal failiure   Omeprazole Nausea And Vomiting   Penicillins    Propoxyphene N-Acetaminophen    Rofecoxib     REACTION: nausea   Sulfa Antibiotics    Tetanus Toxoid     Review of Systems  Constitutional:  Negative for chills and fever.  Respiratory:  Positive for cough. Negative for hemoptysis and wheezing.   Cardiovascular:  Negative for chest pain and leg swelling.      Objective:     BP 120/76   Pulse 85   Temp 98.4 F (36.9 C) (Temporal)   Ht 5' 4.5" (1.638 m)   Wt 154 lb (69.9 kg)   SpO2 98%   BMI 26.03 kg/m  BP Readings from Last 3 Encounters:  03/10/23 120/76  03/06/23 (!) 140/73  11/19/22 120/78   Wt Readings from Last 3 Encounters:  03/10/23 154 lb (69.9 kg)  11/19/22 147 lb 9.6 oz (67 kg)  11/05/22 155 lb 9.6 oz (70.6 kg)      Physical Exam Vitals reviewed.  Constitutional:      General: She is not in acute distress.    Appearance: She is not ill-appearing.  Cardiovascular:     Rate and Rhythm: Normal rate and regular rhythm.   Pulmonary:     Effort: Pulmonary effort is normal. No respiratory distress.     Breath sounds: Rales present. No wheezing.     Comments: She actually has rales in both bases, slightly more prominent on the right.  No wheezes Musculoskeletal:     Cervical back: Neck supple.  Lymphadenopathy:     Cervical: No cervical adenopathy.  Neurological:     Mental Status: She is alert.      No results found for any visits on 03/10/23.    The ASCVD Risk score (Arnett DK, et al., 2019) failed to calculate for the following reasons:   Cannot find a previous HDL lab   Cannot find a previous total cholesterol lab    Assessment & Plan:   Patient seen with 1 month history of subacute cough with recent chest x-ray in the ER suggesting possible left lower lobe pneumonia.  However, she has had no fever and had normal white count.  She does have rales in the left base but also the right.  She does have a history of Sjogren syndrome but no known history of interstitial lung disease.  -Currently on broad coverage with Zithromax and Augmentin -Finish out antibiotics -Recommend repeat PA and lateral chest x-ray in about 3 weeks to reassess. -If cough persisting at that time consider possible pulmonary referral for further evaluation with high-resolution CT and pulmonary function testing-specially in view of her history of Sjogren's syndrome and bilateral rales noted on exam   No follow-ups on file.    Evelena Peat, MD

## 2023-03-23 DIAGNOSIS — Z85828 Personal history of other malignant neoplasm of skin: Secondary | ICD-10-CM | POA: Diagnosis not present

## 2023-03-23 DIAGNOSIS — L218 Other seborrheic dermatitis: Secondary | ICD-10-CM | POA: Diagnosis not present

## 2023-03-23 DIAGNOSIS — D225 Melanocytic nevi of trunk: Secondary | ICD-10-CM | POA: Diagnosis not present

## 2023-03-23 DIAGNOSIS — L309 Dermatitis, unspecified: Secondary | ICD-10-CM | POA: Diagnosis not present

## 2023-03-23 DIAGNOSIS — D2261 Melanocytic nevi of right upper limb, including shoulder: Secondary | ICD-10-CM | POA: Diagnosis not present

## 2023-03-23 DIAGNOSIS — L814 Other melanin hyperpigmentation: Secondary | ICD-10-CM | POA: Diagnosis not present

## 2023-03-23 DIAGNOSIS — L821 Other seborrheic keratosis: Secondary | ICD-10-CM | POA: Diagnosis not present

## 2023-03-23 DIAGNOSIS — L570 Actinic keratosis: Secondary | ICD-10-CM | POA: Diagnosis not present

## 2023-03-23 DIAGNOSIS — L720 Epidermal cyst: Secondary | ICD-10-CM | POA: Diagnosis not present

## 2023-03-23 DIAGNOSIS — D2272 Melanocytic nevi of left lower limb, including hip: Secondary | ICD-10-CM | POA: Diagnosis not present

## 2023-03-23 DIAGNOSIS — D2262 Melanocytic nevi of left upper limb, including shoulder: Secondary | ICD-10-CM | POA: Diagnosis not present

## 2023-03-23 DIAGNOSIS — D1724 Benign lipomatous neoplasm of skin and subcutaneous tissue of left leg: Secondary | ICD-10-CM | POA: Diagnosis not present

## 2023-04-02 ENCOUNTER — Ambulatory Visit

## 2023-04-02 ENCOUNTER — Other Ambulatory Visit

## 2023-04-02 DIAGNOSIS — J189 Pneumonia, unspecified organism: Secondary | ICD-10-CM

## 2023-04-02 DIAGNOSIS — I700 Atherosclerosis of aorta: Secondary | ICD-10-CM | POA: Diagnosis not present

## 2023-04-15 ENCOUNTER — Other Ambulatory Visit: Payer: Self-pay | Admitting: Family Medicine

## 2023-04-15 DIAGNOSIS — K219 Gastro-esophageal reflux disease without esophagitis: Secondary | ICD-10-CM

## 2023-04-15 NOTE — Addendum Note (Signed)
 Addended by: Christy Sartorius on: 04/15/2023 11:37 AM   Modules accepted: Orders

## 2023-04-17 ENCOUNTER — Other Ambulatory Visit: Payer: Self-pay | Admitting: Family Medicine

## 2023-05-17 ENCOUNTER — Encounter (HOSPITAL_COMMUNITY): Payer: Self-pay | Admitting: Emergency Medicine

## 2023-05-17 ENCOUNTER — Ambulatory Visit (HOSPITAL_COMMUNITY)
Admission: EM | Admit: 2023-05-17 | Discharge: 2023-05-17 | Disposition: A | Discharge status: Home / Self Care | Attending: Neurology | Admitting: Neurology

## 2023-05-17 ENCOUNTER — Ambulatory Visit (INDEPENDENT_AMBULATORY_CARE_PROVIDER_SITE_OTHER)

## 2023-05-17 DIAGNOSIS — R052 Subacute cough: Secondary | ICD-10-CM | POA: Diagnosis not present

## 2023-05-17 DIAGNOSIS — J189 Pneumonia, unspecified organism: Secondary | ICD-10-CM | POA: Diagnosis not present

## 2023-05-17 DIAGNOSIS — R918 Other nonspecific abnormal finding of lung field: Secondary | ICD-10-CM | POA: Diagnosis not present

## 2023-05-17 DIAGNOSIS — I517 Cardiomegaly: Secondary | ICD-10-CM | POA: Diagnosis not present

## 2023-05-17 DIAGNOSIS — I700 Atherosclerosis of aorta: Secondary | ICD-10-CM | POA: Diagnosis not present

## 2023-05-17 MED ORDER — AEROCHAMBER PLUS FLO-VU MISC
1.0000 | Freq: Once | Status: AC
  Administered 2023-05-17: 1

## 2023-05-17 MED ORDER — ALBUTEROL SULFATE HFA 108 (90 BASE) MCG/ACT IN AERS
INHALATION_SPRAY | RESPIRATORY_TRACT | Status: AC
  Filled 2023-05-17: qty 6.7

## 2023-05-17 MED ORDER — AEROCHAMBER PLUS FLO-VU LARGE MISC
Status: AC
  Filled 2023-05-17: qty 1

## 2023-05-17 MED ORDER — PREDNISONE 20 MG PO TABS: 40.0000 mg | ORAL_TABLET | Freq: Every day | ORAL | 0 refills | Status: AC

## 2023-05-17 MED ORDER — ALBUTEROL SULFATE HFA 108 (90 BASE) MCG/ACT IN AERS
2.0000 | INHALATION_SPRAY | Freq: Once | RESPIRATORY_TRACT | Status: AC
  Administered 2023-05-17: 2 via RESPIRATORY_TRACT

## 2023-05-17 MED ORDER — DOXYCYCLINE HYCLATE 100 MG PO CAPS
100.0000 mg | ORAL_CAPSULE | Freq: Two times a day (BID) | ORAL | 0 refills | Status: AC
Start: 2023-05-17 — End: 2023-05-22

## 2023-05-17 NOTE — ED Provider Notes (Signed)
 [facility]    CSN: [id number] Arrival date & time: 05/17/23  1004      History   Chief Complaint Chief Complaint  Patient presents with   Cough   Sore Throat    HPI Christy Moore is a 78 y.o. female.    She has been following with her PCP for a presumed pneumonia since February and in March she completed azithromycin  and Augmentin  for pneumonia.  She did improve slightly but still had a persistent dry hacking cough.  She was then referred to pulmonology and has an appointment scheduled for 6/20.  About a week and a half ago her husband became sick with a respiratory illness and she believes that he passed it onto her.  She developed sore throat, sinuses, dropped into chest, low grade fever.  She has been using  tessalon  pearles, flonase , tylenol /ibuprofen, mucinex, and doing salt water gargles.  At night she has noticed wheezing and rattling in her chest.  She has been able to sleep.  The history is provided by the patient.  Cough Cough characteristics:  Productive Sputum characteristics:  Yellow Onset quality:  Gradual Duration:  1 week Timing:  Constant Progression:  Worsening Chronicity:  Recurrent Smoker: no   Context: sick contacts   Relieved by:  Cough suppressants, decongestant and rest Worsened by:  Lying down and activity Associated symptoms: chest pain and sore throat   Sore Throat Associated symptoms include chest pain.  In March she was on broad coverage azithromycin  and Augmentin  for pneumonia.   Past Medical History:  Diagnosis Date   Arthritis    Cardiomegaly    Depression    GERD (gastroesophageal reflux disease)    Hypertension    Hypothyroidism    Seizures (HCC)    Sinus tachycardia    Sjogren's syndrome (HCC)    Status post dilation of esophageal narrowing 2012   Dr. Andriette Keeling    Patient Active Problem List   Diagnosis Date Noted   Depression 07/13/2020   Thrush 11/03/2012   Sjogren's disease (HCC) 01/22/2012   GERD  (gastroesophageal reflux disease) 03/19/2011   Hypertension 03/19/2011   Raynaud's disease 03/19/2011   Cardiomegaly 02/26/2010   Hypothyroidism 02/26/2010   Wrist fracture, left 02/26/2010   Liver hemangioma 02/26/2010   Migraine 02/26/2010   Esophageal stricture 02/26/2010   Weight loss 02/26/2010   OTHER CHRONIC SINUSITIS 12/20/2007   Rosacea 12/20/2007    Past Surgical History:  Procedure Laterality Date   BASAL CELL CARCINOMA EXCISION  2024   CATARACT EXTRACTION, BILATERAL  2024   HAND TENDON SURGERY  02/09/2007   tendon transfer-Dr. [name]   NASAL SEPTUM SURGERY     operated on by Dr. Franklin Ito 20 years ago   OVARIAN CYST REMOVAL     Right Sided Salpingo-oopherectomy   SALPINGOOPHORECTOMY     WISDOM TOOTH EXTRACTION      OB History   No obstetric history on file.      Home Medications    Prior to Admission medications   Medication Sig Start Date End Date Taking? Authorizing Provider  doxycycline (VIBRAMYCIN) 100 MG capsule Take 1 capsule (100 mg total) by mouth 2 (two) times daily for 5 days. 05/17/23 05/22/23 Yes Webber Michiels, Sim Dryer, NP  predniSONE  (DELTASONE ) 20 MG tablet Take 2 tablets (40 mg total) by mouth daily with breakfast for 5 days. 05/17/23 05/22/23 Yes Tais Koestner, Sim Dryer, NP  amLODipine  (NORVASC ) 5 MG tablet Take 1 tablet (5 mg total) by mouth daily. 10/21/22   Burchette,  Marijean Shouts, MD  cyclobenzaprine  (FLEXERIL ) 10 MG tablet Take 1 tablet (10 mg total) by mouth 2 (two) times daily as needed for muscle spasms. Patient not taking: Reported on 03/10/2023 05/03/22   Scarlette Currier, MD  escitalopram  (LEXAPRO ) 5 MG tablet TAKE 1 TABLET (5 MG TOTAL) BY MOUTH DAILY. 04/17/23   Burchette, Marijean Shouts, MD  famotidine  (PEPCID ) 40 MG tablet TAKE 1 TABLET BY MOUTH EVERYDAY AT BEDTIME 04/15/23   Burchette, Marijean Shouts, MD  fluticasone  (FLONASE ) 50 MCG/ACT nasal spray Place 2 sprays into both nostrils daily as needed for allergies.    [provider]  gatifloxacin (ZYMAXID) 0.5 % SOLN  Place 1 drop into the right eye 4 (four) times daily. 05/06/22   [provider]  levothyroxine  (SYNTHROID ) 100 MCG tablet TAKE 1 TABLET BY MOUTH EVERY DAY 11/24/22   Burchette, Marijean Shouts, MD  meloxicam (MOBIC) 7.5 MG tablet Take 7.5 mg by mouth daily. Patient not taking: Reported on 03/10/2023    [provider]  metroNIDAZOLE  (FLAGYL ) 500 MG tablet Take 1 tablet (500 mg total) by mouth 3 (three) times daily. Patient not taking: Reported on 03/10/2023 11/19/22   Donley Furth, MD  ondansetron  (ZOFRAN ) 8 MG tablet Take 1 tablet (8 mg total) by mouth every 6 (six) hours as needed for nausea or vomiting. 11/19/22   Donley Furth, MD  Polyethyl Glycol-Propyl Glycol (SYSTANE) 0.4-0.3 % SOLN Apply 1 drop to eye. Patient not taking: Reported on 03/10/2023    [provider]  sodium chloride  (OCEAN) 0.65 % nasal spray Place 1 spray into the nose as needed for congestion.    [provider]  VEVYE 0.1 % SOLN Apply 1 drop to eye 2 (two) times daily. 03/20/22   [provider]    Family History Family History  Problem Relation Age of Onset   Cancer Mother        breast and lung   Stroke Mother 58   Hypothyroidism Mother    Cancer Father        prostate   Heart attack Father 82   Alcohol abuse Maternal Grandmother    Alcohol abuse Maternal Grandfather    Allergies Other        Grandmother-pt states she takes after grandmother who had multiple allergies   Colon cancer Neg Hx    Esophageal cancer Neg Hx    Stomach cancer Neg Hx    Rectal cancer Neg Hx     Social History Social History   Tobacco Use   Smoking status: Never   Smokeless tobacco: Never  Vaping Use   Vaping status: Never Used  Substance Use Topics   Alcohol use: Yes    Comment: occasional   Drug use: No     Allergies   Amoxicillin -pot clavulanate, Azithromycin , Celecoxib, Ciprofloxacin, Doxycycline, Erythromycin, Hydrocodone, Hydrocodone-acetaminophen , Levofloxacin, Omeprazole ,  Penicillins, Propoxyphene n-acetaminophen , Rofecoxib, Sulfa antibiotics, and Tetanus toxoid   Review of Systems Review of Systems  HENT:  Positive for sore throat.   Respiratory:  Positive for cough.   Cardiovascular:  Positive for chest pain.  All other systems reviewed and are negative.    Physical Exam Triage Vital Signs ED Triage Vitals [05/17/23 1035]  Encounter Vitals Group     BP 108/71     Systolic BP Percentile      Diastolic BP Percentile      Pulse Rate 91     Resp 17     Temp 99.2 F (37.3 C)  Temp Source Oral     SpO2 96 %     Weight      Height      Head Circumference      Peak Flow      Pain Score 6     Pain Loc      Pain Education      Exclude from Growth Chart    No data found.  Updated Vital Signs BP 108/71 (BP Location: Right Arm)   Pulse 91   Temp 99.2 F (37.3 C) (Oral)   Resp 17   SpO2 96%   Visual Acuity Right Eye Distance:   Left Eye Distance:   Bilateral Distance:    Right Eye Near:   Left Eye Near:    Bilateral Near:     Physical Exam Constitutional:      Appearance: She is normal weight.  HENT:     Head: Normocephalic.     Nose: Congestion present.     Mouth/Throat:     Mouth: Mucous membranes are moist.  Cardiovascular:     Rate and Rhythm: Normal rate and regular rhythm.  Pulmonary:     Breath sounds: Decreased air movement present. Examination of the right-lower field reveals decreased breath sounds and rales. Examination of the left-lower field reveals decreased breath sounds, rhonchi and rales. Decreased breath sounds, rhonchi and rales present.  Abdominal:     Palpations: Abdomen is soft.  Skin:    General: Skin is warm and dry.  Neurological:     General: No focal deficit present.     Mental Status: She is alert.  Psychiatric:        Mood and Affect: Mood normal.        Behavior: Behavior normal.      UC Treatments / Results  Labs (all labs ordered are listed, but only abnormal results are  displayed) Labs Reviewed - No data to display  EKG   Radiology DG Chest 2 View Result Date: 05/17/2023 CLINICAL DATA:  respiratory abnormalities EXAM: CHEST - 2 VIEW COMPARISON:  Radiographs 04/02/2023, 03/06/2023 and 08/06/2022. Chest CTA 05/03/2022. FINDINGS: The heart size and mediastinal contours are stable with mild cardiac enlargement and aortic atherosclerosis. There is chronic interstitial prominence at both lung bases with probable chronic asymmetric left basilar scarring. No new airspace disease, edema, significant pleural effusion or pneumothorax. The bones appear unchanged. IMPRESSION: Chronic interstitial prominence at both lung bases with probable chronic asymmetric left basilar scarring. No evidence of acute cardiopulmonary process. Electronically Signed   By: Elmon Hagedorn M.D.   On: 05/17/2023 11:21    Procedures Procedures (including critical care time)  Medications Ordered in UC Medications  albuterol (VENTOLIN HFA) 108 (90 Base) MCG/ACT inhaler 2 puff (2 puffs Inhalation Given 05/17/23 1150)  Aerochamber Plus device 1 each (1 each Other Given 05/17/23 1150)    Initial Impression / Assessment and Plan / UC Course  I have reviewed the triage vital signs and the nursing notes.  Pertinent labs & imaging results that were available during my care of the patient were reviewed by me and considered in my medical decision making (see chart for details). Viral testing deferred due to duration of symptoms.  Chest x-ray read as no new airspace disease, however given her worsening symptoms and steroids for 5 days.  Additionally she received an inhaler.  She will be seeing her primary care provider tomorrow due to needing to take her husband and she will mention her worsening symptoms to him  as well.  She may need to see pulmonology earlier for continued work up and advanced imaging.    Final Clinical Impressions(s) / UC Diagnoses   Final diagnoses:  Subacute cough  Pneumonia of  left lower lobe due to infectious organism     Discharge Instructions      You have a respiratory infection on top of your chronic respiratory issue.  Given your symptoms we are going to recommend antibiotics for 5 days and steroids for 5 days. Please use your inhaler as needed for wheezing and shortness of breath. You may additionally continue using your over the counter medications.  - Zyrtec at bedtime to help dry up post-nasal drainage contributing to cough. - Tylenol  as needed for aches and pains. - Tessalon  perles every 8 hours as needed for cough. - 1 tablespoon of honey in warm water and/or salt water gargles may also help with symptoms. Humidifier to your room will help add water to the air and reduce coughing.  If you develop any new or worsening symptoms or if your symptoms do not start to improve, please return here. If your symptoms are severe, you have trouble breathing, or develop a fever, please go to the emergency room.   Recommend following up this week with PCP.    ED Prescriptions     Medication Sig Dispense Auth. Provider   doxycycline (VIBRAMYCIN) 100 MG capsule Take 1 capsule (100 mg total) by mouth 2 (two) times daily for 5 days. 10 capsule Imogene Mana, NP   predniSONE  (DELTASONE ) 20 MG tablet Take 2 tablets (40 mg total) by mouth daily with breakfast for 5 days. 10 tablet Imogene Mana, NP      PDMP not reviewed this encounter.   Imogene Mana, NP 05/17/23 1230

## 2023-05-17 NOTE — Discharge Instructions (Addendum)
 You have a respiratory infection on top of your chronic respiratory issue.  Given your symptoms we are going to recommend antibiotics for 5 days and steroids for 5 days. Please use your inhaler as needed for wheezing and shortness of breath. You may additionally continue using your over the counter medications.  - Zyrtec at bedtime to help dry up post-nasal drainage contributing to cough. - Tylenol  as needed for aches and pains. - Tessalon  perles every 8 hours as needed for cough. - 1 tablespoon of honey in warm water and/or salt water gargles may also help with symptoms. Humidifier to your room will help add water to the air and reduce coughing.  If you develop any new or worsening symptoms or if your symptoms do not start to improve, please return here. If your symptoms are severe, you have trouble breathing, or develop a fever, please go to the emergency room.   Recommend following up this week with PCP.

## 2023-05-17 NOTE — ED Triage Notes (Signed)
 Pt reports had a cough for a while so supposed to be going to see a Pulmonologist for it. Reports Thursday started off with sore throat, then had congestion in nose and chest. Cough is sometimes productive. Done salt water gargles, Flonase , took some of her Tessalon  pills, Mucinex cold and Dual tylenol /ibuprofen for aches and low grade fevers of 99-100.

## 2023-05-19 ENCOUNTER — Encounter: Payer: Self-pay | Admitting: Pulmonary Disease

## 2023-05-19 ENCOUNTER — Ambulatory Visit (INDEPENDENT_AMBULATORY_CARE_PROVIDER_SITE_OTHER): Admitting: Pulmonary Disease

## 2023-05-19 VITALS — BP 144/76 | HR 106 | Temp 98.0°F | Ht 64.0 in | Wt 145.8 lb

## 2023-05-19 DIAGNOSIS — J471 Bronchiectasis with (acute) exacerbation: Secondary | ICD-10-CM

## 2023-05-19 DIAGNOSIS — R1319 Other dysphagia: Secondary | ICD-10-CM

## 2023-05-19 DIAGNOSIS — M3502 Sicca syndrome with lung involvement: Secondary | ICD-10-CM

## 2023-05-19 MED ORDER — IPRATROPIUM-ALBUTEROL 0.5-2.5 (3) MG/3ML IN SOLN: 3.0000 mL | Freq: Three times a day (TID) | RESPIRATORY_TRACT | 6 refills | Status: DC

## 2023-05-19 MED ORDER — DOXYCYCLINE HYCLATE 100 MG PO TABS: 100.0000 mg | ORAL_TABLET | Freq: Two times a day (BID) | ORAL | 0 refills | Status: DC

## 2023-05-19 NOTE — Patient Instructions (Addendum)
 We will order you a nebulizer machine and supplies  Use Duoneb nebulizer solution 3 times per day followed by flutter valve for airway clearance - do this for 10-14 days  Use albuterol inhaler 1-2 puffs every 4-6 hours as needed  Complete 14 days total of doxycycline between the Urgent care prescription and new prescription I have sent in today  We will refer you to Rheumatology for further evaluation of Sjogren's disease  We will refer you to GI for your trouble swallowing  We will check a CT Chest scan next month  Schedule pulmonary function tests in 1 month at front desk upon check out  Follow up in 2 months

## 2023-05-19 NOTE — Progress Notes (Signed)
 Synopsis: Referred in May 2025 for Chronic Cough  Subjective:   PATIENT ID: Christy Moore GENDER: female DOB: 1945-06-28, MRN: [id number]   HPI  Chief Complaint  Patient presents with   Consult    Pt states started last Thursday , CC, is on pred. Was Rx doxy can't take    Christy Moore is a 78 year old woman, never smoker with GERD, sjogren's syndrome, esophageal narrowing s/p dilation and hypertension who is referred to pulmonary clinic for chronic cough.   She was treated for pneumonia in 03/06/23 with augmentin  and azithromycin . She went to urgent care 05/17/23 for on going cough. She was treated with prednisone  and doxycycline. Chest radiograph 5/11 shows chronic interstitial prominence at both lung bases. CTA Chest 05/03/22 showed cylindrical bronchiectasis in bilateral lower lobes with mild subpleural streaky opacities.  She developed a sore throat last Thursday, which progressed to sinus congestion and a persistent cough. The cough disrupts her sleep, allowing only two hours of rest last night. She experienced a fever of 100.80F and 13F over the weekend. Wheezing sometimes wakes her up.  She has experienced pneumonia earlier this year and in December, which resolved after treatment. She has a history of bronchitis in childhood. She denies any smoking history.  She is currently taking prednisone  and doxycycline for her symptoms. She experienced significant heartburn after taking doxycycline without sufficient water, which took a day and a half to resolve. She has a history of GERD and esophageal narrowing, which was dilated years ago. She reports difficulty swallowing and frequent choking due to dry mouth, attributed to her Sjogren's disease.  She has Sjogren's disease, which took years to diagnose, and has been informed it can affect her lungs. She had a CT scan done a year ago. She uses an albuterol inhaler, which was given to her recently, but was not provided with instructions on  its use. She uses Flonase  for nasal congestion and saltwater gargles for her sore throat.   Past Medical History:  Diagnosis Date   Arthritis    Cardiomegaly    Depression    GERD (gastroesophageal reflux disease)    Hypertension    Hypothyroidism    Seizures (HCC)    Sinus tachycardia    Sjogren's syndrome (HCC)    Status post dilation of esophageal narrowing 2012   Dr. Andriette Keeling     Family History  Problem Relation Age of Onset   Cancer Mother        breast and lung   Stroke Mother 19   Hypothyroidism Mother    Cancer Father        prostate   Heart attack Father 40   Alcohol abuse Maternal Grandmother    Alcohol abuse Maternal Grandfather    Allergies Other        Grandmother-pt states she takes after grandmother who had multiple allergies   Colon cancer Neg Hx    Esophageal cancer Neg Hx    Stomach cancer Neg Hx    Rectal cancer Neg Hx      Social History   Socioeconomic History   Marital status: Married    Spouse name: Not on file   Number of children: Not on file   Years of education: Not on file   Highest education level: Some college, no degree  Occupational History   Not on file  Tobacco Use   Smoking status: Never   Smokeless tobacco: Never  Vaping Use   Vaping status: Never Used  Substance  and Sexual Activity   Alcohol use: Yes    Comment: occasional   Drug use: No   Sexual activity: Not on file  Other Topics Concern   Not on file  Social History Narrative   Not on file   Social Drivers of Health   Financial Resource Strain: Low Risk  (11/19/2022)   Overall Financial Resource Strain (CARDIA)    Difficulty of Paying Living Expenses: Not hard at all  Food Insecurity: No Food Insecurity (11/19/2022)   Hunger Vital Sign    Worried About Running Out of Food in the Last Year: Never true    Ran Out of Food in the Last Year: Never true  Transportation Needs: No Transportation Needs (11/19/2022)   PRAPARE - Administrator, Civil Service  (Medical): No    Lack of Transportation (Non-Medical): No  Physical Activity: Insufficiently Active (11/19/2022)   Exercise Vital Sign    Days of Exercise per Week: 2 days    Minutes of Exercise per Session: 20 min  Stress: No Stress Concern Present (11/19/2022)   [employer] of Occupational Health - Occupational Stress Questionnaire    Feeling of Stress : Not at all  Social Connections: Socially Integrated (11/19/2022)   Social Connection and Isolation Panel [NHANES]    Frequency of Communication with Friends and Family: More than three times a week    Frequency of Social Gatherings with Friends and Family: Twice a week    Attends Religious Services: More than 4 times per year    Active Member of [employer] or Organizations: Yes    Attends Engineer, structural: More than 4 times per year    Marital Status: Married  Catering manager Violence: Not At Risk (11/05/2022)   Humiliation, Afraid, Rape, and Kick questionnaire    Fear of Current or Ex-Partner: No    Emotionally Abused: No    Physically Abused: No    Sexually Abused: No     Allergies  Allergen Reactions   Amoxicillin -Pot Clavulanate     REACTION: nausea   Azithromycin      REACTION: nausea, Gi   Celecoxib     REACTION: nausea   Ciprofloxacin     REACTION: nausea   Doxycycline     REACTION: gi upset   Erythromycin     REACTION: nause, GI   Hydrocodone     REACTION: nausea   Hydrocodone-Acetaminophen      REACTION: GI upset   Levofloxacin     REACTION: she claims renal failiure   Omeprazole  Nausea And Vomiting   Penicillins    Propoxyphene N-Acetaminophen     Rofecoxib     REACTION: nausea   Sulfa Antibiotics    Tetanus Toxoid      Outpatient Medications Prior to Visit  Medication Sig Dispense Refill   albuterol (VENTOLIN HFA) 108 (90 Base) MCG/ACT inhaler Inhale 1-2 puffs into the lungs every 4 (four) hours as needed for wheezing or shortness of breath.     amLODipine  (NORVASC ) 5 MG tablet Take 1  tablet (5 mg total) by mouth daily. 90 tablet 3   cyclobenzaprine  (FLEXERIL ) 10 MG tablet Take 1 tablet (10 mg total) by mouth 2 (two) times daily as needed for muscle spasms. 20 tablet 0   doxycycline (VIBRAMYCIN) 100 MG capsule Take 1 capsule (100 mg total) by mouth 2 (two) times daily for 5 days. 10 capsule 0   escitalopram  (LEXAPRO ) 5 MG tablet TAKE 1 TABLET (5 MG TOTAL) BY MOUTH DAILY. 90 tablet 1  famotidine  (PEPCID ) 40 MG tablet TAKE 1 TABLET BY MOUTH EVERYDAY AT BEDTIME 90 tablet 0   fluticasone  (FLONASE ) 50 MCG/ACT nasal spray Place 2 sprays into both nostrils daily as needed for allergies.     gatifloxacin (ZYMAXID) 0.5 % SOLN Place 1 drop into the right eye 4 (four) times daily.     levothyroxine  (SYNTHROID ) 100 MCG tablet TAKE 1 TABLET BY MOUTH EVERY DAY 90 tablet 3   meloxicam (MOBIC) 7.5 MG tablet Take 7.5 mg by mouth daily.     ondansetron  (ZOFRAN ) 8 MG tablet Take 1 tablet (8 mg total) by mouth every 6 (six) hours as needed for nausea or vomiting. 60 tablet 0   Polyethyl Glycol-Propyl Glycol (SYSTANE) 0.4-0.3 % SOLN Apply 1 drop to eye.     predniSONE  (DELTASONE ) 20 MG tablet Take 2 tablets (40 mg total) by mouth daily with breakfast for 5 days. 10 tablet 0   sodium chloride  (OCEAN) 0.65 % nasal spray Place 1 spray into the nose as needed for congestion.     VEVYE 0.1 % SOLN Apply 1 drop to eye 2 (two) times daily.     metroNIDAZOLE  (FLAGYL ) 500 MG tablet Take 1 tablet (500 mg total) by mouth 3 (three) times daily. 30 tablet 0   No facility-administered medications prior to visit.   Review of Systems  Constitutional:  Negative for chills, fever, malaise/fatigue and weight loss.  HENT:  Positive for congestion and sinus pain. Negative for sore throat.   Eyes: Negative.   Respiratory:  Positive for cough, sputum production, shortness of breath and wheezing. Negative for hemoptysis.   Cardiovascular:  Negative for chest pain, palpitations, orthopnea, claudication and leg  swelling.  Gastrointestinal:  Positive for heartburn. Negative for abdominal pain, nausea and vomiting.  Genitourinary: Negative.   Musculoskeletal:  Negative for joint pain and myalgias.  Skin:  Negative for rash.  Neurological:  Positive for headaches. Negative for weakness.  Endo/Heme/Allergies: Negative.   Psychiatric/Behavioral: Negative.     Objective:   Vitals:   05/19/23 0838  BP: (!) 144/76  Pulse: (!) 106  Temp: 98 F (36.7 C)  SpO2: (!) 85%  Weight: 145 lb 12.8 oz (66.1 kg)  Height: 5\' 4"  (1.626 m)     Physical Exam Constitutional:      General: She is not in acute distress.    Appearance: Normal appearance.  HENT:     Nose: Congestion present.  Eyes:     General: No scleral icterus.    Conjunctiva/sclera: Conjunctivae normal.  Cardiovascular:     Rate and Rhythm: Normal rate and regular rhythm.  Pulmonary:     Breath sounds: Wheezing and rhonchi present. No rales.  Musculoskeletal:     Right lower leg: No edema.     Left lower leg: No edema.  Skin:    General: Skin is warm and dry.  Neurological:     General: No focal deficit present.    CBC    Component Value Date/Time   WBC 9.1 03/06/2023 0937   RBC 4.70 03/06/2023 0937   HGB 14.3 03/06/2023 0937   HCT 43.0 03/06/2023 0937   PLT 211 03/06/2023 0937   MCV 91.5 03/06/2023 0937   MCV 95.4 08/05/2012 2021   MCH 30.4 03/06/2023 0937   MCHC 33.3 03/06/2023 0937   RDW 13.8 03/06/2023 0937   LYMPHSABS 0.9 05/03/2022 1057   MONOABS 0.6 05/03/2022 1057   EOSABS 0.1 05/03/2022 1057   BASOSABS 0.0 05/03/2022 1057  Latest Ref Rng & Units 03/06/2023    9:37 AM 05/03/2022   10:57 AM 08/02/2020    9:09 AM  BMP  Glucose 70 - 99 mg/dL 678  98  99   BUN 8 - 23 mg/dL 19  14  10    Creatinine 0.44 - 1.00 mg/dL 9.38  1.01  7.51   Sodium 135 - 145 mmol/L 139  139  140   Potassium 3.5 - 5.1 mmol/L 4.1  3.8  3.9   Chloride 98 - 111 mmol/L 105  102  104   CO2 22 - 32 mmol/L 26  28  29    Calcium 8.9 -  10.3 mg/dL 9.5  9.5  9.1    Chest imaging: 05/17/23 Chronic interstitial prominence at both lung bases with probable chronic asymmetric left basilar scarring. No evidence of acute cardiopulmonary process.  CTA Chest 05/03/22 Mediastinum/Nodes: No enlarged mediastinal, hilar, or axillary lymph nodes. Thyroid  gland, trachea, and esophagus demonstrate no significant findings.   Lungs/Pleura: Cylindrical bronchiectasis in bilateral lower lobes with mild subpleural streaky opacities which may represent atelectasis or chronic interstitial lung changes. No focal airspace consolidation.  PFT:     No data to display          Labs:  Path:  Echo 05/22/20: LV EF 50-55%. Grade I diastolic dysfunction. RV systolic function and size is normal.  Heart Catheterization:     Assessment & Plan:   Sjogren's syndrome with lung involvement (HCC) - Plan: Ambulatory referral to Rheumatology, Pulmonary Function Test, CT CHEST HIGH RESOLUTION  Esophageal dysphagia - Plan: Ambulatory referral to Gastroenterology, Ambulatory Referral for DME, Flutter valve  Bronchiectasis with (acute) exacerbation (HCC) - Plan: doxycycline (VIBRA-TABS) 100 MG tablet, ipratropium-albuterol (DUONEB) 0.5-2.5 (3) MG/3ML SOLN, Ambulatory Referral for DME, Flutter valve  Discussion: Christy Moore is a 78 year old woman, never smoker with GERD, sjogren's syndrome, esophageal narrowing s/p dilation and hypertension who is referred to pulmonary clinic for chronic cough.   Bronchiectasis with acute exacerbation CT Chest with lower lobe bronchiectasis involvement - Extend doxycycline to 14 days todal - Set up nebulizer machine and supplies. - Send DuoNeb nebulizer solution to pharmacy. - Use nebulizer and flutter valve three times daily. - Schedule pulmonary function tests in 1-2 months - Order CT scan in June or July.  Sjogren's syndrome with lung involvement Potential lung involvement contributing to bronchiectasis  and possible early fibrosis. Discussed CellCept for inflammation reduction if progression evident. - Refer to rheumatology. - Consider CellCept if progression evident on follow-up CT. - Repeat inflammatory labs at next visit after acute exacerbation  Gastroesophageal reflux disease (GERD) Esophageal Dysphagia GERD causing esophageal discomfort and dysphagia, exacerbated by doxycycline. Adequate fluid intake necessary. - Refer to gastroenterology. - Advise adequate fluid intake with doxycycline.  Raynaud's phenomenon Associated with Sjogren's syndrome. No current symptom discussion.  Follow up in 2 months  Duaine German, MD [facility] Office: [phone]    Current Outpatient Medications:    albuterol (VENTOLIN HFA) 108 (90 Base) MCG/ACT inhaler, Inhale 1-2 puffs into the lungs every 4 (four) hours as needed for wheezing or shortness of breath., Disp: , Rfl:    amLODipine  (NORVASC ) 5 MG tablet, Take 1 tablet (5 mg total) by mouth daily., Disp: 90 tablet, Rfl: 3   cyclobenzaprine  (FLEXERIL ) 10 MG tablet, Take 1 tablet (10 mg total) by mouth 2 (two) times daily as needed for muscle spasms., Disp: 20 tablet, Rfl: 0   doxycycline (VIBRA-TABS) 100 MG tablet, Take 1  tablet (100 mg total) by mouth 2 (two) times daily., Disp: 18 tablet, Rfl: 0   doxycycline (VIBRAMYCIN) 100 MG capsule, Take 1 capsule (100 mg total) by mouth 2 (two) times daily for 5 days., Disp: 10 capsule, Rfl: 0   escitalopram  (LEXAPRO ) 5 MG tablet, TAKE 1 TABLET (5 MG TOTAL) BY MOUTH DAILY., Disp: 90 tablet, Rfl: 1   famotidine  (PEPCID ) 40 MG tablet, TAKE 1 TABLET BY MOUTH EVERYDAY AT BEDTIME, Disp: 90 tablet, Rfl: 0   fluticasone  (FLONASE ) 50 MCG/ACT nasal spray, Place 2 sprays into both nostrils daily as needed for allergies., Disp: , Rfl:    gatifloxacin (ZYMAXID) 0.5 % SOLN, Place 1 drop into the right eye 4 (four) times daily., Disp: , Rfl:    ipratropium-albuterol (DUONEB) 0.5-2.5 (3) MG/3ML  SOLN, Take 3 mLs by nebulization 3 (three) times daily., Disp: 270 mL, Rfl: 6   levothyroxine  (SYNTHROID ) 100 MCG tablet, TAKE 1 TABLET BY MOUTH EVERY DAY, Disp: 90 tablet, Rfl: 3   meloxicam (MOBIC) 7.5 MG tablet, Take 7.5 mg by mouth daily., Disp: , Rfl:    ondansetron  (ZOFRAN ) 8 MG tablet, Take 1 tablet (8 mg total) by mouth every 6 (six) hours as needed for nausea or vomiting., Disp: 60 tablet, Rfl: 0   Polyethyl Glycol-Propyl Glycol (SYSTANE) 0.4-0.3 % SOLN, Apply 1 drop to eye., Disp: , Rfl:    predniSONE  (DELTASONE ) 20 MG tablet, Take 2 tablets (40 mg total) by mouth daily with breakfast for 5 days., Disp: 10 tablet, Rfl: 0   sodium chloride  (OCEAN) 0.65 % nasal spray, Place 1 spray into the nose as needed for congestion., Disp: , Rfl:    VEVYE 0.1 % SOLN, Apply 1 drop to eye 2 (two) times daily., Disp: , Rfl:

## 2023-05-20 ENCOUNTER — Ambulatory Visit (INDEPENDENT_AMBULATORY_CARE_PROVIDER_SITE_OTHER): Admitting: Family Medicine

## 2023-05-20 ENCOUNTER — Encounter: Payer: Self-pay | Admitting: Family Medicine

## 2023-05-20 VITALS — BP 130/72 | HR 91 | Temp 97.9°F | Wt 149.0 lb

## 2023-05-20 DIAGNOSIS — K219 Gastro-esophageal reflux disease without esophagitis: Secondary | ICD-10-CM

## 2023-05-20 DIAGNOSIS — E038 Other specified hypothyroidism: Secondary | ICD-10-CM | POA: Diagnosis not present

## 2023-05-20 DIAGNOSIS — M3500 Sicca syndrome, unspecified: Secondary | ICD-10-CM

## 2023-05-20 DIAGNOSIS — R053 Chronic cough: Secondary | ICD-10-CM | POA: Diagnosis not present

## 2023-05-20 NOTE — Progress Notes (Signed)
 Established Patient Office Visit  Subjective   Patient ID: Christy Moore, female    DOB: 1945-07-15  Age: 78 y.o. MRN: [id number]  Chief Complaint  Patient presents with   Medical Management of Chronic Issues   Cough   Wheezing    HPI   Christy Moore is seen today regarding recent pulmonary issues.  She has had some chronic cough now really for months.  She was treated for pneumonia back at the end of February with Augmentin  and Zithromax .  Was seen in follow-up here end of March and x-ray showed left base consolidation, scarring, or volume loss with possible small left effusion.  On exam here back in early March we had noted some bibasilar rales and brought up issue of possible interstitial lung disease related to her Sjogren's syndrome.  She had worsening of symptoms again few days ago and went to urgent care and was treated with prednisone  and doxycycline.  Chest x-ray on 5-11 showed some chronic interstitial prominence both lung bases.  CT angiogram of the chest 05/03/2022 showed some bronchiectasis type changes bilateral lower lobes.  Patient states she had some low-grade fever recently and had significant wheezing.  Felt very poorly until today.  She slept better last night.  She is still taking the prednisone  and her doxycycline was extended per pulmonary.  However, she feels more hyped up with the prednisone  and is asking to scale this back to 20 mg daily.  Has also had some recent increased dysphagia.  Has had esophageal dilatation previously.  After seeing pulmonary had several things ordered including rheumatology referral, GI referral, PFTs ordered, and high-resolution CT lung ordered.  She has seen another rheumatologist previously but is looking to switch rheumatology practices.  O2 sat yesterday was noted be 85% at pulmonary and up to 98% today.  She also was prescribed DuoNeb and is waiting for her nebulizer.  Generally stays very active.  Enjoys gardening and is an avid  gardener  Past Medical History:  Diagnosis Date   Arthritis    Cardiomegaly    Depression    GERD (gastroesophageal reflux disease)    Hypertension    Hypothyroidism    Seizures (HCC)    Sinus tachycardia    Sjogren's syndrome (HCC)    Status post dilation of esophageal narrowing 2012   Dr. Andriette Keeling   Past Surgical History:  Procedure Laterality Date   BASAL CELL CARCINOMA EXCISION  2024   CATARACT EXTRACTION, BILATERAL  2024   HAND TENDON SURGERY  02/09/2007   tendon transfer-Dr. [name]   NASAL SEPTUM SURGERY     operated on by Dr. Franklin Ito 20 years ago   OVARIAN CYST REMOVAL     Right Sided Salpingo-oopherectomy   SALPINGOOPHORECTOMY     WISDOM TOOTH EXTRACTION      reports that she has never smoked. She has never used smokeless tobacco. She reports current alcohol use. She reports that she does not use drugs. family history includes Alcohol abuse in her maternal grandfather and maternal grandmother; Allergies in an other family member; Cancer in her father and mother; Heart attack (age of onset: 73) in her father; Hypothyroidism in her mother; Stroke (age of onset: 66) in her mother. Allergies  Allergen Reactions   Amoxicillin -Pot Clavulanate     REACTION: nausea   Azithromycin      REACTION: nausea, Gi   Celecoxib     REACTION: nausea   Ciprofloxacin     REACTION: nausea   Doxycycline  REACTION: gi upset   Erythromycin     REACTION: nause, GI   Hydrocodone     REACTION: nausea   Hydrocodone-Acetaminophen      REACTION: GI upset   Levofloxacin     REACTION: she claims renal failiure   Omeprazole  Nausea And Vomiting   Penicillins    Propoxyphene N-Acetaminophen     Rofecoxib     REACTION: nausea   Sulfa Antibiotics    Tetanus Toxoid     Review of Systems  Constitutional:  Negative for chills and fever.  Respiratory:  Positive for cough, sputum production and wheezing. Negative for hemoptysis.   Cardiovascular:  Negative for chest pain.   Gastrointestinal:  Negative for nausea and vomiting.      Objective:     BP 130/72 (BP Location: Left Arm, Patient Position: Sitting, Cuff Size: Normal)   Pulse 91   Temp 97.9 F (36.6 C) (Oral)   Wt 149 lb (67.6 kg)   SpO2 98%   BMI 25.58 kg/m  BP Readings from Last 3 Encounters:  05/20/23 130/72  05/19/23 (!) 144/76  05/17/23 108/71   Wt Readings from Last 3 Encounters:  05/20/23 149 lb (67.6 kg)  05/19/23 145 lb 12.8 oz (66.1 kg)  03/10/23 154 lb (69.9 kg)      Physical Exam Vitals reviewed.  Constitutional:      General: She is not in acute distress.    Appearance: She is not ill-appearing.  Cardiovascular:     Rate and Rhythm: Normal rate and regular rhythm.  Pulmonary:     Comments: Still has some diffuse wheezes posteriorly in the lung bases bilaterally.  No retractions.  O2 sat 98% room air. Musculoskeletal:     Cervical back: Neck supple.  Neurological:     Mental Status: She is alert.      No results found for any visits on 05/20/23.  Last CBC Lab Results  Component Value Date   WBC 9.1 03/06/2023   HGB 14.3 03/06/2023   HCT 43.0 03/06/2023   MCV 91.5 03/06/2023   MCH 30.4 03/06/2023   RDW 13.8 03/06/2023   PLT 211 03/06/2023   Last metabolic panel Lab Results  Component Value Date   GLUCOSE 104 (H) 03/06/2023   NA 139 03/06/2023   K 4.1 03/06/2023   CL 105 03/06/2023   CO2 26 03/06/2023   BUN 19 03/06/2023   CREATININE 0.92 03/06/2023   GFRNONAA >60 03/06/2023   CALCIUM 9.5 03/06/2023   PROT 7.8 05/03/2022   ALBUMIN 4.3 05/03/2022   BILITOT 0.9 05/03/2022   ALKPHOS 53 05/03/2022   AST 17 05/03/2022   ALT 9 05/03/2022   ANIONGAP 8 03/06/2023   Last hemoglobin A1c No results found for: "HGBA1C" Last thyroid  functions Lab Results  Component Value Date   TSH 0.74 11/05/2022      The ASCVD Risk score (Arnett DK, et al., 2019) failed to calculate for the following reasons:   Cannot find a previous HDL lab   Cannot find a  previous total cholesterol lab    Assessment & Plan:   #1 chronic cough with concern for Sjogren syndrome with lung involvement.  Patient has seen pulmonary with multiple things ordered as above including high-resolution CT chest, pulmonary function test, rheumatology referral.  She is currently doxycycline and prednisone  and feels somewhat better today.  No fever.  Has had some difficulties tolerating higher dose prednisone .  She is waiting to start DuoNeb.  #2 recurrent dysphagia recently in the setting of  chronic GERD.  Referral has been placed back to GI.  She has had previous esophageal dilatation several years ago.  #3 hypothyroidism on replacement with levothyroxine .  Due for repeat TSH around 10/25  Glean Lamy, MD

## 2023-05-20 NOTE — Patient Instructions (Signed)
Follow up promptly for any fever or increasing shortness of breath. 

## 2023-05-25 ENCOUNTER — Ambulatory Visit: Payer: Self-pay | Admitting: Pulmonary Disease

## 2023-05-25 ENCOUNTER — Ambulatory Visit (INDEPENDENT_AMBULATORY_CARE_PROVIDER_SITE_OTHER): Admitting: Pulmonary Disease

## 2023-05-25 ENCOUNTER — Encounter (HOSPITAL_BASED_OUTPATIENT_CLINIC_OR_DEPARTMENT_OTHER): Payer: Self-pay | Admitting: Pulmonary Disease

## 2023-05-25 VITALS — BP 118/74 | HR 93 | Ht 64.5 in | Wt 148.3 lb

## 2023-05-25 DIAGNOSIS — J209 Acute bronchitis, unspecified: Secondary | ICD-10-CM

## 2023-05-25 DIAGNOSIS — M3500 Sicca syndrome, unspecified: Secondary | ICD-10-CM

## 2023-05-25 DIAGNOSIS — J471 Bronchiectasis with (acute) exacerbation: Secondary | ICD-10-CM

## 2023-05-25 MED ORDER — PROMETHAZINE-DM 6.25-15 MG/5ML PO SYRP: 5.0000 mL | ORAL_SOLUTION | Freq: Four times a day (QID) | ORAL | 0 refills | Status: DC | PRN

## 2023-05-25 MED ORDER — ALBUTEROL SULFATE (5 MG/ML) 0.5% IN NEBU: 2.5000 mg | INHALATION_SOLUTION | Freq: Four times a day (QID) | RESPIRATORY_TRACT | 12 refills | Status: DC | PRN

## 2023-05-25 MED ORDER — AIRSUPRA 90-80 MCG/ACT IN AERO: 2.0000 | INHALATION_SPRAY | Freq: Four times a day (QID) | RESPIRATORY_TRACT | Status: DC | PRN

## 2023-05-25 NOTE — Progress Notes (Signed)
 Subjective:    Patient ID: Christy Moore, female    DOB: [birth year], 78 y.o.   MRN: [id number]  HPI  78 year old woman, never smoker with sjogren's syndrome,  who was seen 05/19/23 pulmonary clinic for chronic cough (JD)    She was treated for pneumonia in 03/06/23 with augmentin  and azithromycin . She went to urgent care 05/17/23 for on going cough. She was treated with prednisone  and doxycycline . Chest radiograph 5/11 shows chronic interstitial prominence at both lung bases. CTA Chest 05/03/22 showed cylindrical bronchiectasis in bilateral lower lobes with mild subpleural streaky opacities.   Last OV >> Extend doxycycline  to 14 days todal - Set up nebulizer machine and supplies. - Send DuoNeb nebulizer solution to pharmacy.  PMH : GERD and esophageal narrowing, which was dilated years ago. She reports difficulty swallowing and frequent choking due to dry mouth, attributed to her Sjogren's disease.   She has Sjogren's disease  Chief Complaint  Patient presents with   Follow-up    Pt states no progress w/ treatment, dry mouth due to nebulizer treatment, no fever    Cough    Persistent cough for for the last week often leading to dry heaving   She has experienced an ongoing cough for two weeks, initially productive with white to cream to reddish brown sputum, now dry. She feels phlegm in her throat but cannot expel it, leading to a sensation of swallowing it. The cough is accompanied by wheezing, a rattling sound when lying down, and occasional difficulty breathing. Coughing spells lead to dry heaves and vomiting.  She has been treated with prednisone , which she cannot tolerate due to insomnia. Doxycycline  is taken with food to prevent gastrointestinal upset. She uses a nebulizer with albuterol , but experiences severe dry mouth and burning sensations from additional medication. She has lost taste and smell. An inhaler with a spacer provides some relief. Ibuprofen is used for muscle pain,  and saline solution for nasal congestion.  She experiences significant chest soreness from coughing and has difficulty sleeping, having only slept in her bed two nights since her last visit.  Review of Systems neg for any significant sore throat, dysphagia, itching, sneezing, nasal congestion or excess/ purulent secretions, fever, chills, sweats, unintended wt loss, pleuritic or exertional cp, hempoptysis, orthopnea pnd or change in chronic leg swelling. Also denies presyncope, palpitations, heartburn, abdominal pain, nausea, vomiting, diarrhea or change in bowel or urinary habits, dysuria,hematuria, rash, arthralgias, visual complaints, headache, numbness weakness or ataxia.     Objective:   Physical Exam  Gen. Pleasant, well-nourished, in no distress ENT - no thrush, no pallor/icterus,no post nasal drip Neck: No JVD, no thyromegaly, no carotid bruits Lungs: no use of accessory muscles, no dullness to percussion, Ltbasal  rales , BL scattered rhonchi  Cardiovascular: Rhythm regular, heart sounds  normal, no murmurs or gallops, no peripheral edema Musculoskeletal: No deformities, no cyanosis or clubbing        Assessment & Plan:     Acute bronchitis with bronchiectasis/ exacerbation Ongoing cough for two weeks, initially productive with white to reddish-brown sputum, now dry. Wheezing and rattling sounds when lying down. Difficulty breathing and swallowing phlegm. Coughing spells leading to dry heaves and vomiting. Previous treatment with doxycycline  and prednisone , but prednisone  caused insomnia. Nebulizer treatment with albuterol  and ipratropium caused dry mouth and burning sensation. Bronchiectasis noted at the bibasal regions of the lungs, likely due to Sjogren's syndrome or past infection. Current symptoms suggest acute bronchitis superimposed on bronchiectasis. Infection  may have been triggered by recent exposure to husband's bronchitis. - Discontinue ipratropium in nebulizer due to  adverse effects. - Prescribe plain albuterol  for nebulizer use. - Provide a non-codeine cough suppressant. If ineffective after two nights, prescribe a stronger cough suppressant with codeine. - Provide a sample of an albuterol -steroid combination inhaler to use instead of the current albuterol  inhaler since she cannot toelrate oral/pareneteral steroids - Instruct to rinse mouth after using the steroid inhaler to prevent oral candidiasis. - Has follow-up chest CT in one month to assess changes in bronchiectasis/ILD  Sjogren's syndrome Sjogren's syndrome contributing to lung scarring and bronchiectasis. Dry mouth exacerbated by ipratropium in nebulizer.

## 2023-05-25 NOTE — Telephone Encounter (Signed)
 Copied from CRM [id number]. Topic: Clinical - Red Word Triage >> May 25, 2023  7:43 AM Juliana Ocean wrote: Red Word that prompted transfer to Nurse Triage: wheezing/severe cough. Rattling in chest  Really dry mouth thinks from nebulizer No improvement since 5/13 visit w/ Dr Marda Shack SUMMARY NOTE: Pt reporting that she has had worsening coughing spells since her visit on 5/13, having interrupted sleep, wheezing, having trouble getting sputum out, when sputum last came out 3 days ago it was reddish-brown, also concerned about how her Sjogren's and duoneb are interacting, pt reporting that her mouth "feels scalded" with tongue "cherry red" and tastes "like eating sandpaper." Pt confirms no swelling or trouble swallowing, confirms "not necessarily SOB" to begin with and not worsened after nebulizer. Pt confirms that her fever is gone since starting meds, but prednisone  and coughing/wheezing are keeping her awake at night, confirms torso is sore from coughing but only while coughing. Advised pt be reexamined today with pulm, scheduled with pulm this am, advised if any worsening go to ED. Pt verbalized understanding.  E2C2 Pulmonary Triage - Initial Assessment Questions "Chief Complaint (e.g., cough, sob, wheezing, fever, chills, sweat or additional symptoms) *Go to specific symptom protocol after initial questions. Wheezing Severe cough Not necessarily SOB Only improvement is no fever Coughed til torso so sore can barely move Can't sleep because wheezing/rattling so loud and coughing so much Can't get anything up from lungs, dry heaves from coughing so much Sjogren's, use nebulizer, mouth feels like scalded it, tongue super red, tasted like eating sandpaper Coughing fits Coughing spells just take it out of me Dewald said would get flutter valve but not delivered with nebulizer When was able to get sputum up was reddish brown 3 days ago No pain in chest except when coughing Just exhausted No  dizziness, weakness Must be extremely sensitive to prednisone  even just 1 tab keeps me awake all night long No swelling, but cherry red tongue, not really any SOB, seems to break it up a bit and it rattles and I'll cough then it'll slow down, 10x than burning tongue on coffee First experience with nebulizer  "How long have symptoms been present?" Same and when coughing spells it's worse  MEDICINES:   "Have you used any OTC meds to help with symptoms?" Yes If yes, ask "What medications?" Ibuprofen relieves some of torso pain, saline nasal spray works a little bit Tessalon  pearles from Dr. Darren Em for cough, out of them now but they helped  "Have you used your inhalers/maintenance medication?" Yes If yes, "What medications?" Albuterol  inhaler helping a little bit, cough sounds loose but not getting anything out duoneb  If inhaler, ask "How many puffs and how often?" Note: Review instructions on medication in the chart. Use nebulizer 2x/day was 3x but mouth hurts so bad, inhaler 3-4x/day in between nebulizer  OXYGEN: "Do you wear supplemental oxygen?" No  "Do you monitor your oxygen levels?" No  Reason for Disposition  Wheezing is present  Answer Assessment - Initial Assessment Questions 6. CARDIAC HISTORY: "Do you have any history of heart disease?" (e.g., heart attack, angina, bypass surgery, angioplasty)      HTN, cardiomegaly 7. LUNG HISTORY: "Do you have any history of lung disease?"  (e.g., pulmonary embolus, asthma, emphysema)     Chronic sinusitis  Protocols used: Breathing Difficulty-A-AH, Cough - Acute Non-Productive-A-AH

## 2023-05-25 NOTE — Telephone Encounter (Signed)
Pt was seen for appt today. 

## 2023-05-25 NOTE — Patient Instructions (Signed)
 X Sample of Air supra - 2 puffs every 6h as needed  X COugh syrup   X Rx albuterol  nebs every 6h as needed  X Flutter valve  Call if no better

## 2023-05-26 ENCOUNTER — Telehealth (HOSPITAL_BASED_OUTPATIENT_CLINIC_OR_DEPARTMENT_OTHER): Payer: Self-pay

## 2023-05-26 NOTE — Telephone Encounter (Signed)
 Please advise is lower dose of Albuterol  is okay?   Copied from CRM [id number]. Topic: Clinical - Prescription Issue >> May 26, 2023  9:09 AM [name] wrote: Reason for CRM: dr Villa Greaser sent in albuterol  (PROVENTIL ) (5 MG/ML) 0.5% nebulizer solution on yesterday but [facility] say [facility] do not [facility] that strength and the pharmacy asked could patient use a lower strength . [facility] said they would call Dr Villa Greaser and they would reach back out to her  but no one has said anything . Patient is wanting to know the status of getting a lower strength . Patient did say she will go to [facility] if they carry the stronger strength .  [facility] on [address] across from [place] [phone]  Patient did say you can leave a detailed message if she can't get to the phone .  Please reach out to patient concerning the medication

## 2023-05-27 ENCOUNTER — Other Ambulatory Visit (HOSPITAL_BASED_OUTPATIENT_CLINIC_OR_DEPARTMENT_OTHER): Payer: Self-pay

## 2023-05-27 MED ORDER — ALBUTEROL SULFATE (2.5 MG/3ML) 0.083% IN NEBU: 2.5000 mg | INHALATION_SOLUTION | Freq: Four times a day (QID) | RESPIRATORY_TRACT | 12 refills | Status: DC | PRN

## 2023-05-27 NOTE — Telephone Encounter (Signed)
 Order placed and pt notified.

## 2023-05-27 NOTE — Addendum Note (Signed)
 Addended by: Kary Pages on: 05/27/2023 09:28 AM   Modules accepted: Orders

## 2023-06-08 DIAGNOSIS — H6502 Acute serous otitis media, left ear: Secondary | ICD-10-CM | POA: Diagnosis not present

## 2023-06-09 DIAGNOSIS — M48061 Spinal stenosis, lumbar region without neurogenic claudication: Secondary | ICD-10-CM | POA: Diagnosis not present

## 2023-06-09 DIAGNOSIS — M25551 Pain in right hip: Secondary | ICD-10-CM | POA: Diagnosis not present

## 2023-06-09 DIAGNOSIS — M7061 Trochanteric bursitis, right hip: Secondary | ICD-10-CM | POA: Diagnosis not present

## 2023-06-10 ENCOUNTER — Telehealth: Payer: Self-pay

## 2023-06-10 NOTE — Telephone Encounter (Signed)
 Copied from CRM [id number]. Topic: Referral - Question >> Jun 10, 2023 10:01 AM Christy Moore wrote: Reason for CRM: Patient states she was under the impression that Dr.Dewald's staff would schedule her rheumatologist visit - patient is requesting for office to schedule her appointment for her.     LM for PT to call back, (we only send referrals that clinic will call her to schedule. WE do not schedule for other clinics we are not apart of ).

## 2023-06-18 ENCOUNTER — Encounter: Payer: Self-pay | Admitting: Gastroenterology

## 2023-06-18 ENCOUNTER — Ambulatory Visit: Admitting: Gastroenterology

## 2023-06-18 VITALS — BP 110/70 | HR 88 | Ht 64.5 in | Wt 146.0 lb

## 2023-06-18 DIAGNOSIS — Z8719 Personal history of other diseases of the digestive system: Secondary | ICD-10-CM

## 2023-06-18 DIAGNOSIS — M3500 Sicca syndrome, unspecified: Secondary | ICD-10-CM | POA: Diagnosis not present

## 2023-06-18 DIAGNOSIS — R1319 Other dysphagia: Secondary | ICD-10-CM

## 2023-06-18 DIAGNOSIS — K219 Gastro-esophageal reflux disease without esophagitis: Secondary | ICD-10-CM | POA: Diagnosis not present

## 2023-06-18 NOTE — Patient Instructions (Signed)
 Nice to meet you today. Come back and see us  as needed.  _______________________________________________________  If your blood pressure at your visit was 140/90 or greater, please contact your primary care physician to follow up on this.  _______________________________________________________  If you are age 76 or older, your body mass index should be between 23-30. Your Body mass index is 24.67 kg/m. If this is out of the aforementioned range listed, please consider follow up with your Primary Care Provider.  If you are age 69 or younger, your body mass index should be between 19-25. Your Body mass index is 24.67 kg/m. If this is out of the aformentioned range listed, please consider follow up with your Primary Care Provider.   ________________________________________________________  The [facility] providers would like to encourage you to use MYCHART to communicate with providers for non-urgent requests or questions.  Due to long hold times on the telephone, sending your provider a message by [facility] may be a faster and more efficient way to get a response.  Please allow 48 business hours for a response.  Please remember that this is for non-urgent requests.  _______________________________________________________  I appreciate the opportunity to care for you. Valiant Gaul, PA

## 2023-06-18 NOTE — Progress Notes (Signed)
 Christy Moore [id number] 06/10/1945   Chief Complaint: Dysphagia  Referring Provider: Marquetta Sit, MD Primary GI MD: Para Bold (previous Dr. Howard Macho)  HPI: Christy Moore is a 78 y.o. female with past medical history of GERD, esophageal narrowing s/p dilation 2012 with Dr. Andriette Keeling, HTN, hypothyroidism, seizures, Sjogren syndrome, depression, cardiomegaly who presents today for a complaint of esophageal dysphagia .    Patient last seen in office 11/06/2020 by Dr. Howard Macho for chronic GERD, Sjogren's syndrome with dry mouth.  Was advised that it was safe for her to remain on famotidine  40 mg as well as to take a morning famotidine  20 or 40 mg.  She has history of dysphagia and a benign GE junction stricture.  EGD 2004, Dr. Willy Harvest treated with [facility] dilator.  Transferred care to Dr. Andriette Keeling.  EGD around 2012, with repeat dilation and good relief of her symptoms.  Repeat EGD 08/2020 after establishing care with Dr. Howard Macho found mild nonspecific gastritis.  Biopsies from stomach and esophagus were all normal.  She had a colonoscopy 08/2020 and found no polyps, colon was randomly biopsied due to recent diarrheal illness and biopsies were all normal.  05/20/2023 patient seen by PCP with chronic cough with concern for Sjogren's syndrome and lung involvement.  Also with recurrent dysphagia recently in the setting of chronic GERD for which she is referred to us .  Following with pulmonology.  Multiple recent imaging studies of the chest, and scheduled for chest CT 07/01/2023.   Patient states she was recently diagnosed with chronic pneumonia.  She was prescribed doxycycline  for 2 weeks.  Completed full course, and since treatment has not had any acid reflux, heartburn, regurgitation or dysphagia.  Prior to taking doxycycline , states that she would frequently have reflux with burning sensation in her throat, and regurgitation if she bent forward.  At times would feel like food was not going down  but getting stuck in her throat and requiring her to drink a lot of water.  She does have Sjogren's syndrome and often has a dry mouth, has to drink a lot of water when she eats.  She was having sensation of food getting stuck about once a week, but now has not had any issues in the last 3 weeks.  She is having further workup with pulmonology to include pulmonary function testing and a chest CT in a couple weeks.  She would like to hold off on further evaluation of her dysphagia and GERD as she is doing well at this time.   Previous GI Procedures/Imaging   EGD 08/13/2020 - Non- specific gastritis. Biopsied to check for H. pylori.  - The examination was otherwise normal.  - Biopsies were taken with a cold forceps from the esopahgus to check for Eosinophilic Esophagitis. Path: 2. Surgical [P], gastric - MILD CHRONIC GASTRITIS WITHOUT ACTIVITY - NO INTESTINAL METAPLASIA IDENTIFIED - SEE COMMENT 3. Surgical [P], distal esophagus - REACTIVE SQUAMOUS MUCOSA WITH INCREASED INTRAEPITHELIAL LYMPHOCYTES AND RARE EOSINOPHILS - SEE COMMENT 4. Surgical [P], proximal esophagus - REACTIVE SQUAMOUS MUCOSA WITH INCREASED INTRAEPITHELIAL LYMPHOCYTES AND RARE EOSINOPHILS - SEE COMMENT *Negative for H. pylori  Colonoscopy 08/13/2020 - The examined portion of the ileum was normal.  - Diverticulosis in the left colon.  - The examination was otherwise normal on direct and retroflexion views.  - Biopsies were taken with a cold forceps from the entire colon for evaluation of microscopic colitis. Path: 1. Surgical [P], colon nos, random sites - BENIGN COLONIC MUCOSA - NO ACTIVE  INFLAMMATION OR EVIDENCE OF MICROSCOPIC COLITIS - NO HIGH GRADE DYSPLASIA OR MALIGNANCY IDENTIFIED  EGD 10/21/2002 - Esophageal stricture in distal esophagus, dilated  Past Medical History:  Diagnosis Date   Arthritis    Cardiomegaly    Depression    GERD (gastroesophageal reflux disease)    Hypertension    Hypothyroidism     Seizures (HCC)    Sinus tachycardia    Sjogren's syndrome (HCC)    Status post dilation of esophageal narrowing 2012   Dr. Andriette Keeling    Past Surgical History:  Procedure Laterality Date   BASAL CELL CARCINOMA EXCISION  2024   CATARACT EXTRACTION, BILATERAL  2024   HAND TENDON SURGERY  02/09/2007   tendon transfer-Dr. [name]   NASAL SEPTUM SURGERY     operated on by Dr. Franklin Ito 20 years ago   OVARIAN CYST REMOVAL     Right Sided Salpingo-oopherectomy   SALPINGOOPHORECTOMY     WISDOM TOOTH EXTRACTION      Current Outpatient Medications  Medication Sig Dispense Refill   albuterol  (PROVENTIL ) (2.5 MG/3ML) 0.083% nebulizer solution Take 3 mLs (2.5 mg total) by nebulization every 6 (six) hours as needed for wheezing or shortness of breath. 75 mL 12   albuterol  (VENTOLIN  HFA) 108 (90 Base) MCG/ACT inhaler Inhale 1-2 puffs into the lungs every 4 (four) hours as needed for wheezing or shortness of breath.     Albuterol -Budesonide (AIRSUPRA ) 90-80 MCG/ACT AERO Inhale 2 puffs into the lungs every 6 (six) hours as needed.     amLODipine  (NORVASC ) 5 MG tablet Take 1 tablet (5 mg total) by mouth daily. 90 tablet 3   cyclobenzaprine  (FLEXERIL ) 10 MG tablet Take 1 tablet (10 mg total) by mouth 2 (two) times daily as needed for muscle spasms. 20 tablet 0   doxycycline  (VIBRA -TABS) 100 MG tablet Take 1 tablet (100 mg total) by mouth 2 (two) times daily. 18 tablet 0   escitalopram  (LEXAPRO ) 5 MG tablet TAKE 1 TABLET (5 MG TOTAL) BY MOUTH DAILY. 90 tablet 1   famotidine  (PEPCID ) 40 MG tablet TAKE 1 TABLET BY MOUTH EVERYDAY AT BEDTIME 90 tablet 0   fluticasone  (FLONASE ) 50 MCG/ACT nasal spray Place 2 sprays into both nostrils daily as needed for allergies.     gatifloxacin (ZYMAXID) 0.5 % SOLN Place 1 drop into the right eye 4 (four) times daily.     levothyroxine  (SYNTHROID ) 100 MCG tablet TAKE 1 TABLET BY MOUTH EVERY DAY 90 tablet 3   meloxicam (MOBIC) 7.5 MG tablet Take 7.5 mg by mouth daily.      ondansetron  (ZOFRAN ) 8 MG tablet Take 1 tablet (8 mg total) by mouth every 6 (six) hours as needed for nausea or vomiting. 60 tablet 0   Polyethyl Glycol-Propyl Glycol (SYSTANE) 0.4-0.3 % SOLN Apply 1 drop to eye.     promethazine -dextromethorphan (PROMETHAZINE -DM) 6.25-15 MG/5ML syrup Take 5 mLs by mouth 4 (four) times daily as needed for cough. 180 mL 0   sodium chloride  (OCEAN) 0.65 % nasal spray Place 1 spray into the nose as needed for congestion.     VEVYE 0.1 % SOLN Apply 1 drop to eye 2 (two) times daily.     No current facility-administered medications for this visit.    Allergies as of 06/18/2023 - Review Complete 05/25/2023  Allergen Reaction Noted   Amoxicillin -pot clavulanate  12/20/2007   Azithromycin   12/20/2007   Celecoxib  12/20/2007   Ciprofloxacin  12/20/2007   Doxycycline   12/20/2007   Erythromycin  12/20/2007  Hydrocodone  12/20/2007   Hydrocodone-acetaminophen   12/20/2007   Levofloxacin  12/20/2007   Omeprazole  Nausea And Vomiting 12/14/2020   Penicillins  10/03/2018   Propoxyphene n-acetaminophen   12/20/2007   Rofecoxib  12/20/2007   Sulfa antibiotics  10/03/2018   Tetanus toxoid  12/20/2007    Family History  Problem Relation Age of Onset   Cancer Mother        breast and lung   Stroke Mother 54   Hypothyroidism Mother    Cancer Father        prostate   Heart attack Father 62   Alcohol abuse Maternal Grandmother    Alcohol abuse Maternal Grandfather    Allergies Other        Grandmother-pt states she takes after grandmother who had multiple allergies   Colon cancer Neg Hx    Esophageal cancer Neg Hx    Stomach cancer Neg Hx    Rectal cancer Neg Hx     Social History   Tobacco Use   Smoking status: Never   Smokeless tobacco: Never  Vaping Use   Vaping status: Never Used  Substance Use Topics   Alcohol use: Yes    Comment: occasional   Drug use: No     Review of Systems:    Constitutional: No weight loss, fever, chills, weakness or  fatigue Eyes: No change in vision Ears, Nose, Throat:  No change in hearing or congestion Skin: No rash or itching Cardiovascular: No chest pain, chest pressure or palpitations   Respiratory: Cough with deep inhalation, no shortness of breath Gastrointestinal: See HPI and otherwise negative Genitourinary: No dysuria or change in urinary frequency Neurological: No headache, dizziness or syncope Musculoskeletal: No new muscle or joint pain Hematologic: No bleeding or bruising    Physical Exam:  Vital signs: BP 110/70   Pulse 88   Ht 5' 4.5 (1.638 m)   Wt 146 lb (66.2 kg)   BMI 24.67 kg/m    Constitutional: NAD, Well developed, Well nourished, alert and cooperative Head:  Normocephalic and atraumatic.  Eyes: No scleral icterus. Conjunctiva pink. Mouth: No oral lesions. Respiratory: Respirations even and unlabored. Lungs clear to auscultation bilaterally.  No wheezes, crackles, or rhonchi.  Cardiovascular:  Regular rate and rhythm. No murmurs. No peripheral edema. Gastrointestinal:  Soft, nondistended, nontender. No rebound or guarding. Normal bowel sounds. No appreciable masses or hepatomegaly. Rectal:  Not performed.  Neurologic:  Alert and oriented x4;  grossly normal neurologically.  Skin:   Dry and intact without significant lesions or rashes. Psychiatric: Oriented to person, place and time. Demonstrates good judgement and reason without abnormal affect or behaviors.   RELEVANT LABS AND IMAGING: CBC    Component Value Date/Time   WBC 9.1 03/06/2023 0937   RBC 4.70 03/06/2023 0937   HGB 14.3 03/06/2023 0937   HCT 43.0 03/06/2023 0937   PLT 211 03/06/2023 0937   MCV 91.5 03/06/2023 0937   MCV 95.4 08/05/2012 2021   MCH 30.4 03/06/2023 0937   MCHC 33.3 03/06/2023 0937   RDW 13.8 03/06/2023 0937   LYMPHSABS 0.9 05/03/2022 1057   MONOABS 0.6 05/03/2022 1057   EOSABS 0.1 05/03/2022 1057   BASOSABS 0.0 05/03/2022 1057    CMP     Component Value Date/Time   NA 139  03/06/2023 0937   K 4.1 03/06/2023 0937   CL 105 03/06/2023 0937   CO2 26 03/06/2023 0937   GLUCOSE 104 (H) 03/06/2023 0937   BUN 19 03/06/2023 0937  CREATININE 0.92 03/06/2023 0937   CALCIUM 9.5 03/06/2023 0937   PROT 7.8 05/03/2022 1057   ALBUMIN 4.3 05/03/2022 1057   AST 17 05/03/2022 1057   ALT 9 05/03/2022 1057   ALKPHOS 53 05/03/2022 1057   BILITOT 0.9 05/03/2022 1057   GFRNONAA >60 03/06/2023 0937   GFRAA >60 12/11/2018 1319   Echocardiogram 05/22/2020 1. Left ventricular ejection fraction, by estimation, is 50 to 55% . The left ventricle has low normal function. The left ventricle has no regional wall motion abnormalities. Left ventricular diastolic parameters are consistent with Grade I diastolic dysfunction ( impaired relaxation) . Elevated left atrial pressure. The average left ventricular global longitudinal strain is - 22. 3 % . The global longitudinal strain is normal.  2. Right ventricular systolic function is normal. The right ventricular size is normal. Tricuspid regurgitation signal is inadequate for assessing PA pressure.  3. The mitral valve is normal in structure. Mild mitral valve regurgitation. No evidence of mitral stenosis.  4. The aortic valve is tricuspid. Aortic valve regurgitation is not visualized. Mild aortic valve sclerosis is present, with no evidence of aortic valve stenosis.  5. The inferior vena cava is normal in size with greater than 50% respiratory variability, suggesting right atrial pressure of 3 mmHg.  Assessment/Plan:   Esophageal dysphagia GERD Sjogren's syndrome History of esophageal stricture Patient doing well at this time, has not had any acid reflux, heartburn, regurgitation, or dysphagia in the last 3 weeks.  Previously was having frequent acid reflux and regurgitation as well as dysphagia to solids with feeling of food getting stuck in her throat about once a week, resolved with drinking water.  She wants to hold off on further testing  at this time but will call us  back if symptoms return.  - Restart famotidine  if recurrence of GERD symptoms. - If recurrence of dysphagia, consider EGD with dilation based on history of esophageal stricture.  Alternatively could start with barium swallow.    Valiant Gaul, PA-C [facility] 06/18/2023, 8:27 AM  Patient Care Team: Marquetta Sit, MD as PCP - General (Family Medicine) Maudine Sos, MD as PCP - Cardiology (Cardiology) Alver Austin, [facility] (Inactive) as Pharmacist (Pharmacist)

## 2023-06-26 ENCOUNTER — Ambulatory Visit: Admitting: Internal Medicine

## 2023-06-26 DIAGNOSIS — M25551 Pain in right hip: Secondary | ICD-10-CM | POA: Diagnosis not present

## 2023-06-26 DIAGNOSIS — M545 Low back pain, unspecified: Secondary | ICD-10-CM | POA: Diagnosis not present

## 2023-07-01 ENCOUNTER — Ambulatory Visit (HOSPITAL_BASED_OUTPATIENT_CLINIC_OR_DEPARTMENT_OTHER)
Admission: RE | Admit: 2023-07-01 | Discharge: 2023-07-01 | Disposition: A | Discharge status: Home / Self Care | Source: Ambulatory Visit | Attending: Pulmonary Disease | Admitting: Pulmonary Disease

## 2023-07-01 ENCOUNTER — Encounter: Payer: Self-pay | Admitting: Pulmonary Disease

## 2023-07-01 ENCOUNTER — Ambulatory Visit (HOSPITAL_BASED_OUTPATIENT_CLINIC_OR_DEPARTMENT_OTHER): Admitting: Pulmonary Disease

## 2023-07-01 ENCOUNTER — Ambulatory Visit: Admitting: Pulmonary Disease

## 2023-07-01 VITALS — BP 131/73 | HR 94 | Ht 64.0 in | Wt 147.0 lb

## 2023-07-01 DIAGNOSIS — R918 Other nonspecific abnormal finding of lung field: Secondary | ICD-10-CM | POA: Diagnosis not present

## 2023-07-01 DIAGNOSIS — J471 Bronchiectasis with (acute) exacerbation: Secondary | ICD-10-CM

## 2023-07-01 DIAGNOSIS — J90 Pleural effusion, not elsewhere classified: Secondary | ICD-10-CM | POA: Diagnosis not present

## 2023-07-01 DIAGNOSIS — M3502 Sicca syndrome with lung involvement: Secondary | ICD-10-CM | POA: Insufficient documentation

## 2023-07-01 DIAGNOSIS — R1319 Other dysphagia: Secondary | ICD-10-CM

## 2023-07-01 DIAGNOSIS — J984 Other disorders of lung: Secondary | ICD-10-CM | POA: Diagnosis not present

## 2023-07-01 LAB — PULMONARY FUNCTION TEST
DL/VA % pred: 128 %
DL/VA: 5.26 ml/min/mmHg/L
DLCO cor % pred: 75 %
DLCO cor: 14.63 ml/min/mmHg
DLCO unc % pred: 75 %
DLCO unc: 14.63 ml/min/mmHg
FEF 25-75 Post: 1.53 L/s
FEF 25-75 Pre: 1.42 L/s
FEF2575-%Change-Post: 7 %
FEF2575-%Pred-Post: 93 %
FEF2575-%Pred-Pre: 86 %
FEV1-%Change-Post: 1 %
FEV1-%Pred-Post: 72 %
FEV1-%Pred-Pre: 70 %
FEV1-Post: 1.53 L
FEV1-Pre: 1.5 L
FEV1FVC-%Change-Post: 0 %
FEV1FVC-%Pred-Pre: 108 %
FEV6-%Change-Post: 2 %
FEV6-%Pred-Post: 70 %
FEV6-%Pred-Pre: 68 %
FEV6-Post: 1.9 L
FEV6-Pre: 1.85 L
FEV6FVC-%Pred-Post: 105 %
FEV6FVC-%Pred-Pre: 105 %
FVC-%Change-Post: 2 %
FVC-%Pred-Post: 67 %
FVC-%Pred-Pre: 65 %
FVC-Post: 1.9 L
FVC-Pre: 1.85 L
Post FEV1/FVC ratio: 81 %
Post FEV6/FVC ratio: 100 %
Pre FEV1/FVC ratio: 81 %
Pre FEV6/FVC Ratio: 100 %
RV % pred: 66 %
RV: 1.56 L
TLC % pred: 69 %
TLC: 3.54 L

## 2023-07-01 NOTE — Patient Instructions (Signed)
 Your breathing tests show mild restriction and mild diffusion defect  Your CT chest scan appears stable to me, will await the final radiology read  Please let us  know if you need lab testing after you see Dr. Austin  Follow up in 6 months for repeat breathing tests

## 2023-07-01 NOTE — Patient Instructions (Signed)
 Full PFT performed today.

## 2023-07-01 NOTE — Progress Notes (Signed)
 Full PFT performed today.

## 2023-07-01 NOTE — Progress Notes (Signed)
 Synopsis: Referred in May 2025 for Chronic Cough  Subjective:   PATIENT ID: Christy Moore GENDER: female DOB: Apr 11, 1945, MRN: [id number]   HPI  Chief Complaint  Patient presents with   Follow-up    Pt states swelling in hands and feet x 2 weeks   Christy Moore is a 78 year old woman, never smoker with GERD, sjogren's syndrome, esophageal narrowing s/p dilation and hypertension who is referred to pulmonary clinic for chronic cough.   She was treated for pneumonia in 03/06/23 with augmentin  and azithromycin . She went to urgent care 05/17/23 for on going cough. She was treated with prednisone  and doxycycline . Chest radiograph 5/11 shows chronic interstitial prominence at both lung bases. CTA Chest 05/03/22 showed cylindrical bronchiectasis in bilateral lower lobes with mild subpleural streaky opacities.  She developed a sore throat last Thursday, which progressed to sinus congestion and a persistent cough. The cough disrupts her sleep, allowing only two hours of rest last night. She experienced a fever of 100.11F and 58F over the weekend. Wheezing sometimes wakes her up.  She has experienced pneumonia earlier this year and in December, which resolved after treatment. She has a history of bronchitis in childhood. She denies any smoking history.  She is currently taking prednisone  and doxycycline  for her symptoms. She experienced significant heartburn after taking doxycycline  without sufficient water, which took a day and a half to resolve. She has a history of GERD and esophageal narrowing, which was dilated years ago. She reports difficulty swallowing and frequent choking due to dry mouth, attributed to her Sjogren's disease.  She has Sjogren's disease, which took years to diagnose, and has been informed it can affect her lungs. She had a CT scan done a year ago. She uses an albuterol  inhaler, which was given to her recently, but was not provided with instructions on its use. She uses Flonase   for nasal congestion and saltwater gargles for her sore throat.   OV 07/01/23 PFTs show mild restriction and mild diffusion defect.  HRCT Chest shows bibasilar bronchiectasis similar to prior scans.  She reports she is seeing a Publishing rights manager with [employer]. She has seen Dr. Mai in the past.   Past Medical History:  Diagnosis Date   Arthritis    Cardiomegaly    Chronic pneumonia    Depression    GERD (gastroesophageal reflux disease)    Hypertension    Hypothyroidism    Raynaud disease    Seizures (HCC)    Sinus tachycardia    Sjogren's syndrome (HCC)    Status post dilation of esophageal narrowing 2012   Dr. Luis     Family History  Problem Relation Age of Onset   Cancer Mother        breast and lung   Stroke Mother 6   Hypothyroidism Mother    Cancer Father        prostate   Heart attack Father 14   Alcohol abuse Maternal Grandmother    Alcohol abuse Maternal Grandfather    Allergies Other        Grandmother-pt states she takes after grandmother who had multiple allergies   Colon cancer Neg Hx    Esophageal cancer Neg Hx    Stomach cancer Neg Hx    Rectal cancer Neg Hx      Social History   Socioeconomic History   Marital status: Married    Spouse name: Not on file   Number of children: 2   Years of education: Not  on file   Highest education level: Some college, no degree  Occupational History   Occupation: retired  Tobacco Use   Smoking status: Never   Smokeless tobacco: Never  Vaping Use   Vaping status: Never Used  Substance and Sexual Activity   Alcohol use: Yes    Comment: rarely   Drug use: No   Sexual activity: Not on file  Other Topics Concern   Not on file  Social History Narrative   Not on file   Social Drivers of Health   Financial Resource Strain: Low Risk  (11/19/2022)   Overall Financial Resource Strain (CARDIA)    Difficulty of Paying Living Expenses: Not hard at all  Food Insecurity: No Food Insecurity  (11/19/2022)   Hunger Vital Sign    Worried About Running Out of Food in the Last Year: Never true    Ran Out of Food in the Last Year: Never true  Transportation Needs: No Transportation Needs (11/19/2022)   PRAPARE - Administrator, Civil Service (Medical): No    Lack of Transportation (Non-Medical): No  Physical Activity: Insufficiently Active (11/19/2022)   Exercise Vital Sign    Days of Exercise per Week: 2 days    Minutes of Exercise per Session: 20 min  Stress: No Stress Concern Present (11/19/2022)   [employer] of Occupational Health - Occupational Stress Questionnaire    Feeling of Stress : Not at all  Social Connections: Socially Integrated (11/19/2022)   Social Connection and Isolation Panel    Frequency of Communication with Friends and Family: More than three times a week    Frequency of Social Gatherings with Friends and Family: Twice a week    Attends Religious Services: More than 4 times per year    Active Member of [employer] or Organizations: Yes    Attends Engineer, structural: More than 4 times per year    Marital Status: Married  Catering manager Violence: Not At Risk (11/05/2022)   Humiliation, Afraid, Rape, and Kick questionnaire    Fear of Current or Ex-Partner: No    Emotionally Abused: No    Physically Abused: No    Sexually Abused: No     Allergies  Allergen Reactions   Airsupra  [Albuterol -Budesonide] Other (See Comments)    Burned her tongue   Amoxicillin -Pot Clavulanate     REACTION: nausea   Azithromycin      REACTION: nausea, Gi   Celecoxib     REACTION: nausea   Ciprofloxacin     REACTION: nausea   Erythromycin     REACTION: nause, GI   Hydrocodone     REACTION: nausea   Hydrocodone-Acetaminophen      REACTION: GI upset   Levofloxacin     REACTION: she claims renal failiure   Omeprazole  Nausea And Vomiting   Penicillins    Propoxyphene N-Acetaminophen     Rofecoxib     REACTION: nausea   Sulfa Antibiotics     Tetanus Toxoid      Outpatient Medications Prior to Visit  Medication Sig Dispense Refill   albuterol  (VENTOLIN  HFA) 108 (90 Base) MCG/ACT inhaler Inhale 1-2 puffs into the lungs every 4 (four) hours as needed for wheezing or shortness of breath.     amLODipine  (NORVASC ) 5 MG tablet Take 1 tablet (5 mg total) by mouth daily. 90 tablet 3   doxycycline  (VIBRA -TABS) 100 MG tablet Take 1 tablet (100 mg total) by mouth 2 (two) times daily. 18 tablet 0   escitalopram  (LEXAPRO ) 5  MG tablet TAKE 1 TABLET (5 MG TOTAL) BY MOUTH DAILY. 90 tablet 1   famotidine  (PEPCID ) 40 MG tablet TAKE 1 TABLET BY MOUTH EVERYDAY AT BEDTIME 90 tablet 0   fluticasone  (FLONASE ) 50 MCG/ACT nasal spray Place 2 sprays into both nostrils daily as needed for allergies.     levothyroxine  (SYNTHROID ) 100 MCG tablet TAKE 1 TABLET BY MOUTH EVERY DAY 90 tablet 3   No facility-administered medications prior to visit.   Review of Systems  Constitutional:  Negative for chills, fever, malaise/fatigue and weight loss.  HENT:  Positive for congestion and sinus pain. Negative for sore throat.   Eyes: Negative.   Respiratory:  Positive for cough, sputum production, shortness of breath and wheezing. Negative for hemoptysis.   Cardiovascular:  Negative for chest pain, palpitations, orthopnea, claudication and leg swelling.  Gastrointestinal:  Positive for heartburn. Negative for abdominal pain, nausea and vomiting.  Genitourinary: Negative.   Musculoskeletal:  Negative for joint pain and myalgias.  Skin:  Negative for rash.  Neurological:  Positive for headaches. Negative for weakness.  Endo/Heme/Allergies: Negative.   Psychiatric/Behavioral: Negative.     Objective:   Vitals:   07/01/23 1414  BP: 131/73  Pulse: 94  SpO2: 95%  Weight: 147 lb (66.7 kg)  Height: 5' 4 (1.626 m)     Physical Exam Constitutional:      General: She is not in acute distress.    Appearance: Normal appearance.  HENT:     Nose: No congestion.   Eyes:     General: No scleral icterus.    Conjunctiva/sclera: Conjunctivae normal.  Cardiovascular:     Rate and Rhythm: Normal rate and regular rhythm.  Pulmonary:     Breath sounds: No wheezing, rhonchi or rales.  Musculoskeletal:     Right lower leg: No edema.     Left lower leg: No edema.  Skin:    General: Skin is warm and dry.  Neurological:     General: No focal deficit present.    CBC    Component Value Date/Time   WBC 9.1 03/06/2023 0937   RBC 4.70 03/06/2023 0937   HGB 14.3 03/06/2023 0937   HCT 43.0 03/06/2023 0937   PLT 211 03/06/2023 0937   MCV 91.5 03/06/2023 0937   MCV 95.4 08/05/2012 2021   MCH 30.4 03/06/2023 0937   MCHC 33.3 03/06/2023 0937   RDW 13.8 03/06/2023 0937   LYMPHSABS 0.9 05/03/2022 1057   MONOABS 0.6 05/03/2022 1057   EOSABS 0.1 05/03/2022 1057   BASOSABS 0.0 05/03/2022 1057      Latest Ref Rng & Units 03/06/2023    9:37 AM 05/03/2022   10:57 AM 08/02/2020    9:09 AM  BMP  Glucose 70 - 99 mg/dL 895  98  99   BUN 8 - 23 mg/dL 19  14  10    Creatinine 0.44 - 1.00 mg/dL 9.07  9.21  9.06   Sodium 135 - 145 mmol/L 139  139  140   Potassium 3.5 - 5.1 mmol/L 4.1  3.8  3.9   Chloride 98 - 111 mmol/L 105  102  104   CO2 22 - 32 mmol/L 26  28  29    Calcium 8.9 - 10.3 mg/dL 9.5  9.5  9.1    Chest imaging: OV 07/01/23 1. Bibasilar bronchiectasis and volume loss, findings which favor scarring. Difficult to definitively exclude interstitial lung disease. Findings are indeterminate for UIP per consensus guidelines: Diagnosis of Idiopathic Pulmonary Fibrosis: An Official  ATS/ERS/JRS/ALAT Clinical Practice Guideline. Am JINNY [employer] Crit Care Med Vol 198, Iss 5, [phone], Sep 06 2016. 2. Trace left pleural effusion. 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 4. Aortic atherosclerosis (ICD10-I70.0). Coronary artery calcification. 5. Enlarged pulmonic trunk, indicative of pulmonary  arterial hypertension.  05/17/23 Chronic interstitial prominence at both lung bases with probable chronic asymmetric left basilar scarring. No evidence of acute cardiopulmonary process.  CTA Chest 05/03/22 Mediastinum/Nodes: No enlarged mediastinal, hilar, or axillary lymph nodes. Thyroid  gland, trachea, and esophagus demonstrate no significant findings.   Lungs/Pleura: Cylindrical bronchiectasis in bilateral lower lobes with mild subpleural streaky opacities which may represent atelectasis or chronic interstitial lung changes. No focal airspace consolidation.  PFT:    Latest Ref Rng & Units 07/01/2023    9:59 AM  PFT Results  FVC-Pre L 1.85  P  FVC-Predicted Pre % 65  P  FVC-Post L 1.90  P  FVC-Predicted Post % 67  P  Pre FEV1/FVC % % 81  P  Post FEV1/FCV % % 81  P  FEV1-Pre L 1.50  P  FEV1-Predicted Pre % 70  P  FEV1-Post L 1.53  P  DLCO uncorrected ml/min/mmHg 14.63  P  DLCO UNC% % 75  P  DLCO corrected ml/min/mmHg 14.63  P  DLCO COR %Predicted % 75  P  DLVA Predicted % 128  P  TLC L 3.54  P  TLC % Predicted % 69  P  RV % Predicted % 66  P    P Preliminary result    Labs:  Path:  Echo 05/22/20: LV EF 50-55%. Grade I diastolic dysfunction. RV systolic function and size is normal.  Heart Catheterization:     Assessment & Plan:   Sjogren's syndrome with lung involvement (HCC)  Bronchiectasis with (acute) exacerbation (HCC) - Plan: Pulmonary Function Test  Esophageal dysphagia  Discussion: Christy Moore is a 78 year old woman, never smoker with GERD, sjogren's syndrome, esophageal narrowing s/p dilation and hypertension who is referred to pulmonary clinic for chronic cough.   Bronchiectasis without complication CT Chest with lower lobe bronchiectasis involvement - continue duonebs - Use nebulizer and flutter valve three times daily. - Order CT scan in June or July. - repeat PFTs in 6 months  Sjogren's syndrome with lung involvement Potential lung  involvement contributing to bronchiectasis and possible early fibrosis.  - Refer to rheumatology. - trend PFTs and HRCT scans   Follow up in 6 months  Dorn Chill, MD [facility] Office: [phone]    Current Outpatient Medications:    albuterol  (VENTOLIN  HFA) 108 (90 Base) MCG/ACT inhaler, Inhale 1-2 puffs into the lungs every 4 (four) hours as needed for wheezing or shortness of breath., Disp: , Rfl:    amLODipine  (NORVASC ) 5 MG tablet, Take 1 tablet (5 mg total) by mouth daily., Disp: 90 tablet, Rfl: 3   doxycycline  (VIBRA -TABS) 100 MG tablet, Take 1 tablet (100 mg total) by mouth 2 (two) times daily., Disp: 18 tablet, Rfl: 0   escitalopram  (LEXAPRO ) 5 MG tablet, TAKE 1 TABLET (5 MG TOTAL) BY MOUTH DAILY., Disp: 90 tablet, Rfl: 1   famotidine  (PEPCID ) 40 MG tablet, TAKE 1 TABLET BY MOUTH EVERYDAY AT BEDTIME, Disp: 90 tablet, Rfl: 0   fluticasone  (FLONASE ) 50 MCG/ACT nasal spray, Place 2 sprays into both nostrils daily as needed for allergies., Disp: , Rfl:    levothyroxine  (SYNTHROID ) 100 MCG tablet, TAKE 1 TABLET BY MOUTH EVERY DAY, Disp: 90 tablet,  Rfl: 3

## 2023-07-03 ENCOUNTER — Other Ambulatory Visit: Payer: Self-pay

## 2023-07-03 DIAGNOSIS — M3502 Sicca syndrome with lung involvement: Secondary | ICD-10-CM

## 2023-07-03 NOTE — Telephone Encounter (Signed)
 Copied from CRM [id number]. Topic: General - Other >> Jul 02, 2023  9:42 AM Rilla NOVAK wrote: Reason for CRM:  Patient saw Dr Kara on yesterday and he referred her to someone he thought was a rheumatologist, however, the provider was not a rheumatologist and patient is in need of a blood panel. Please call patient. Please call [phone] try home first... or [phone]. >> Jul 03, 2023 10:34 AM Russell PARAS wrote: Pt is contacting clinic regarding previous message left on 06/26. She was referred to Rheumatology by [facility], to Dr Austin. However, Dr. Austin is not a Rheumatologist, so she has canceled the appt with this provider.   She was advised by Kara that if Dr. Austin was not a rheumatologist, that he would refer her to Dr. Bernadine.   Pt is also requesting if an order can be placed for blood panel testing for autoimmune diseases. Was advised by provider that they can do this bloodwork so that she will have results to present to rheumatologist.  Contacted [facility], advised to send message  CB# [phone]-home, [phone]-husbands cell

## 2023-07-06 ENCOUNTER — Telehealth: Payer: Self-pay

## 2023-07-06 DIAGNOSIS — M3502 Sicca syndrome with lung involvement: Secondary | ICD-10-CM

## 2023-07-06 NOTE — Telephone Encounter (Signed)
 I have ordered inflammatory lab panel for patient to come in to get labs. Once we have lab results will be able to place referral to rheumatology.  Thanks, JD

## 2023-07-06 NOTE — Telephone Encounter (Signed)
 Copied from CRM [id number]. Topic: Referral - Question >> Jul 06, 2023 10:00 AM Rozanna G wrote: Reason for CRM: PT CALLING IN REGARDS TO HER RHEUMATOLOGY REFERRAL. PT IS WANTING TO SEE SOMEONE SOONER THAN DECEMBER, STATED SHE HAS CALLED SEVERAL TIMES AND NO ONE HAS CALLED HER BACK, PLEASE CALL PT IN REGARDS TO THE REFERRAL.

## 2023-07-07 DIAGNOSIS — H6502 Acute serous otitis media, left ear: Secondary | ICD-10-CM | POA: Diagnosis not present

## 2023-07-07 DIAGNOSIS — H90A21 Sensorineural hearing loss, unilateral, right ear, with restricted hearing on the contralateral side: Secondary | ICD-10-CM | POA: Diagnosis not present

## 2023-07-07 DIAGNOSIS — H90A32 Mixed conductive and sensorineural hearing loss, unilateral, left ear with restricted hearing on the contralateral side: Secondary | ICD-10-CM | POA: Diagnosis not present

## 2023-07-07 NOTE — Telephone Encounter (Signed)
 Spoke w/ PT  verbalized understanding    NFN

## 2023-07-07 NOTE — Progress Notes (Signed)
 Otolaryngology Clinic Note  HPI:    Christy Moore is a 78 y.o. female who presents as a return patient.  Christy Moore returns today for follow-up of serous otitis media of the left ear.  She notes no improvement since her last visit.  She has been using the Flonase  spray.  She tried Valsalva maneuvers but felt like that made it worse.  Hearing evaluation obtained prior to today's visit.  PMH/Meds/All/SocHx/FamHx/ROS:   Medical History[1]  Surgical History[2]  No family history of bleeding disorders, wound healing problems or difficulty with anesthesia.      Current Medications[3]  A complete ROS was performed with pertinent positives/negatives noted in the HPI. The remainder of the ROS are negative.    Physical Exam:    BP 123/80   Pulse 83   Temp 97.4 F (36.3 C) (Temporal)   Resp 18   Ht 1.626 m (5' 4)   Wt 65.4 kg (144 lb 3.2 oz)   BMI 24.75 kg/m   Constitutional:  Patient appears well-nourished and well-developed. No acute distress.   Head/Face: Facial features are symmetric. Skull is normocephalic. Hair and scalp are normal. Normal temporal artery pulses. TMJ shows no joint deformity swelling or erythema.   Eyes: Pupils are equal, round and reactive to light. Conjunctiva and lids are normal. Normal extraocular mobility. Normal vision by patient report.   Ears:     Right: Pinna and external meatus normal, normal ear canal skin and caliber without excessive cerumen or drainage. Tympanic membranes intact without effusion or infection.    Left: Pinna and external meatus normal, normal ear canal skin and caliber without excessive cerumen or drainage. Tympanic membranes intact without effusion or infection.    Nose/Sinus/Nasopharynx: Septum is normal. Normal nasal mucosa. Normal inferior turbinates.    Oral cavity/Oropharynx: Lips normal, teeth and gums normal with good dentition, normal oral vestibule. Normal floor of mouth, tongue and oral mucosa, no mucosal  lesions, ulcer or mass, normal tongue mobility.  Hard and soft palate normal with normal mobility. One plus tonsils, no erythema or exudate. Base of tongue, retromolar trigone and oral pharynx normal. Normal sensation, mobility and gag.   Neck: No cervical lymphadenopathy, mass or swelling. Salivary glands normal to palpation without swelling, erythema or mass. Normal facial nerve function. Normal thyroid  gland palpation.   Neurological: Alert and oriented to self, place and time.  Normal reflexes and motor skills, balance and coordination.   Psychiatric: No unusual anxiety or evidence of depression. Appropriate affect.     Independent Review of Additional Tests or Records:  Audiological evaluation: Audiometry the right ear shows normal thresholds from 250 Hz through 750 Hz downsloping to moderate loss at 1000 Hz rising back to normal thresholds at 2000 Hz and 3000 Hz then downsloping to moderately severe loss at 8000 Hz.  The left ear shows mild to severe mixed loss. SRT's: Right ear shows 25 dB HL.  The left ear shows 40 dB HL. Word recognition scores: Right ear shows 88% at 65 dB HL with 45 dB EM.  The left ear shows 88% at 80 dB HL with 60 dB EM. Tympanometry: The right ear shows a type A tympanogram.  The left ear shows a type B tympanogram.  Procedures:  Procedure Note -flexible nasal Endoscopy   Risks/benefits and possible complications of this procedure were discussed in detail and the patient understood and agreed to proceed.  The nose was sprayed with oxymetazoline and 4% lidocaine . With the patient in the upright position,  the  flexible endscope was inserted into the nasal passage bilaterlly.  Where visible, nasal secretions and mucosal crusting were removed with suction. The overall appearance of the nasal cavity and paranasal sinuses were noted and the findings are described below.  Findings: No nasopharyngeal lesions seen     The patient tolerated the procedure  without difficulty and was discharged in stable condition.  Alm RAMAN. Spainhour, PA-C [facility]   Impression & Plans:   1) serous otitis media-left ear   Continue current medications We will have this lady follow-up with one of the surgeons for consideration of left myringotomy and tube placement.  Alm RAMAN. Spainhour, PA-C [facility]        [1] History reviewed. No pertinent past medical history. [2] History reviewed. No pertinent surgical history. [3]  Current Outpatient Medications:  .  albuterol  2.5 mg /3 mL (0.083 %) nebulizer solution, Inhale 2.5 mg., Disp: , Rfl:  .  albuterol -budesonide (Airsupra ) 90-80 mcg/actuation HFAA, Inhale 2 puffs every 6 (six) hours as needed., Disp: , Rfl:  .  amLODIPine  (NORVASC ) 2.5 mg tablet, 2.5 mg Once Daily., Disp: , Rfl:  .  escitalopram  (LEXAPRO ) 5 mg tablet, Take 5 mg by mouth Once Daily., Disp: , Rfl:  .  famotidine  (PEPCID ) 40 mg tablet, TAKE 1 TABLET BY MOUTH EVERYDAY AT BEDTIME, Disp: 90 tablet, Rfl: 1 .  fluticasone  propionate (FLONASE ) 50 mcg/spray nasal spray, as needed., Disp: , Rfl:  .  levothyroxine  (SYNTHROID ) 100 mcg tablet, 100 mcg Once Daily., Disp: , Rfl:  .  ondansetron  (ZOFRAN ) 8 mg tablet, TAKE 1 TABLET (8 MG TOTAL) BY MOUTH EVERY 6 (SIX) HOURS AS NEEDED FOR NAUSEA OR VOMITING., Disp: , Rfl:

## 2023-07-08 ENCOUNTER — Encounter: Payer: Self-pay | Admitting: Pulmonary Disease

## 2023-07-08 ENCOUNTER — Other Ambulatory Visit (INDEPENDENT_AMBULATORY_CARE_PROVIDER_SITE_OTHER)

## 2023-07-08 DIAGNOSIS — M3502 Sicca syndrome with lung involvement: Secondary | ICD-10-CM | POA: Diagnosis not present

## 2023-07-08 LAB — C-REACTIVE PROTEIN: CRP: 2.4 mg/dL (ref 0.5–20.0)

## 2023-07-08 LAB — CK: Total CK: 60 U/L (ref 7–177)

## 2023-07-08 LAB — SEDIMENTATION RATE: Sed Rate: 50 mm/h — ABNORMAL HIGH (ref 0–30)

## 2023-07-09 NOTE — Telephone Encounter (Signed)
 FYI

## 2023-07-11 ENCOUNTER — Encounter: Payer: Self-pay | Admitting: Pulmonary Disease

## 2023-07-12 ENCOUNTER — Ambulatory Visit: Payer: Self-pay | Admitting: Rheumatology

## 2023-07-12 NOTE — Progress Notes (Signed)
 The labs are suggestive of Sjogren's.  Considering her age, bronchiectasis and risk of infection, based on her pulmonary findings do you recommend use of CellCept or Imuran? If so would you mind starting her on the medication? I noted that her new patient appointment is very far and I will try to find a spot to schedule her earlier and also will put her on a cancellation list.

## 2023-07-14 LAB — CYCLIC CITRUL PEPTIDE ANTIBODY, IGG: Cyclic Citrullin Peptide Ab: 59 U — ABNORMAL HIGH

## 2023-07-14 LAB — ANTI-SCLERODERMA ANTIBODY: Scleroderma (Scl-70) (ENA) Antibody, IgG: <1 AI

## 2023-07-14 LAB — ANTI-NUCLEAR AB-TITER (ANA TITER): ANA Titer 1: 1:1280 {titer} — ABNORMAL HIGH

## 2023-07-14 LAB — CENTROMERE ANTIBODIES: Centromere Ab Screen: <1 AI

## 2023-07-14 LAB — ANTI-DNA ANTIBODY, DOUBLE-STRANDED: ds DNA Ab: <1 [IU]/mL

## 2023-07-14 LAB — ANCA SCREEN W REFLEX TITER: ANCA SCREEN: NEGATIVE

## 2023-07-14 LAB — SJOGREN'S SYNDROME ANTIBODS(SSA + SSB)
SSA (Ro) (ENA) Antibody, IgG: >8 AI — AB
SSB (La) (ENA) Antibody, IgG: <1 AI

## 2023-07-14 LAB — RHEUMATOID FACTOR: Rheumatoid fact SerPl-aCnc: 10 [IU]/mL (ref <14)

## 2023-07-14 LAB — ANTI-SMITH ANTIBODY: ENA SM Ab Ser-aCnc: <1 AI

## 2023-07-14 LAB — ANA: Anti Nuclear Antibody (ANA): POSITIVE — AB

## 2023-07-14 LAB — ALDOLASE: Aldolase: 5.7 U/L (ref <=8.1)

## 2023-07-14 NOTE — Progress Notes (Unsigned)
 Office Visit Note  Patient: Christy Moore             Date of Birth: 22-Feb-1945           MRN: [id number]             PCP: Micheal Wolm LELON, MD Referring: Kara Dorn NOVAK, MD Visit Date: 07/16/2023 Occupation: @GUAROCC @  Subjective:  Pain and swelling in joints, ILD, sicca symptoms  History of Present Illness: Christy Moore is a 78 y.o. female seen for the evaluation of Sjogren's, ILD and inflammatory arthritis.  According the patient she has had chronic sinusitis and tonsil stone formation for many years.  She was diagnosed with sicca symptoms by her ENT and later by ophthalmologist.  She states she was under care of her cardiologist for palpitations in the past.  She had cataract surgery 2 years ago at the time her ophthalmologist told her that her symptoms were suggestive of Sjogren's syndrome.  She used to have migraines headaches which gradually improved.  She also had a history of gastritis.  She states in the last 2 years she has had about 3-4 episodes of pneumonia.  The last episode was in April 2025 which was treated with doxycycline  for 2 weeks.  Her PCP referred her to Dr. Kara for evaluation of recurrent pneumonia.  According to the patient she has mild shortness of breath but denies any cough.  She states Dr. Diboll did extensive workup including labs and high-resolution CT.  He diagnosed her with bronchiectasis and mild ILD.  He send recommended starting on CellCept.  Patient states for the last 2 weeks she has been having increased pain and swelling in her hands and her feet.  She had right [facility] surgery by Dr. Arlyss in the past and contacted him.  He recommended ibuprofen.  She took ibuprofen for 1 week and discontinued due to GI side effects.  She denies any sores in her mouth or nose.  There is no history of Raynaud's phenomenon, lymphadenopathy, photosensitivity or malar rash.  The patient states that she will be getting a PE tube placement in her left ear due to chronic  otitis media. She is retired.  She used to work as a Scientist, physiological at the surgical center.  She enjoys gardening.  She is married and accompanied by her husband to the office today.  She is gravida 2, para 2.  There is no history of preeclampsia or DVTs.  She drinks alcohol only occasionally.  She has been a non-smoker.  However she had secondary smoking while her father smoked.  She has a cat and a dog.  She enjoys gardening.     Activities of Daily Living:  Patient reports morning stiffness for 24 hours.   Patient Reports nocturnal pain.  Difficulty dressing/grooming: Reports Difficulty climbing stairs: Reports Difficulty getting out of chair: Reports Difficulty using hands for taps, buttons, cutlery, and/or writing: Reports  Review of Systems  Constitutional:  Positive for fatigue.  HENT:  Positive for mouth dryness. Negative for mouth sores.   Eyes:  Positive for dryness.  Respiratory:  Positive for shortness of breath.   Cardiovascular:  Negative for chest pain and palpitations.  Gastrointestinal:  Negative for blood in stool, constipation and diarrhea.  Endocrine: Negative for increased urination.  Genitourinary:  Negative for involuntary urination.  Musculoskeletal:  Positive for joint pain, joint pain, joint swelling, myalgias, morning stiffness and myalgias. Negative for gait problem, muscle weakness and muscle tenderness.  Skin:  Negative for color change, rash, hair loss and sensitivity to sunlight.  Allergic/Immunologic: Negative for susceptible to infections.  Neurological:  Negative for dizziness and headaches.  Hematological:  Negative for swollen glands.  Psychiatric/Behavioral:  Positive for depressed mood. Negative for sleep disturbance. The patient is not nervous/anxious.     PMFS History:  Patient Active Problem List   Diagnosis Date Noted   Depression 07/13/2020   Thrush 11/03/2012   Sjogren's disease (HCC) 01/22/2012   GERD (gastroesophageal reflux disease)  03/19/2011   Hypertension 03/19/2011   Raynaud's disease 03/19/2011   Cardiomegaly 02/26/2010   Hypothyroidism 02/26/2010   Wrist fracture, left 02/26/2010   Liver hemangioma 02/26/2010   Migraine 02/26/2010   Esophageal stricture 02/26/2010   Weight loss 02/26/2010   OTHER CHRONIC SINUSITIS 12/20/2007   Rosacea 12/20/2007    Past Medical History:  Diagnosis Date   Arthritis    Cardiomegaly    Chronic pneumonia    Depression    GERD (gastroesophageal reflux disease)    Hypertension    Hypothyroidism    Raynaud disease    Seizures (HCC)    Sinus tachycardia    Sjogren's syndrome (HCC)    Status post dilation of esophageal narrowing 2012   Dr. Luis    Family History  Problem Relation Age of Onset   Cancer Mother        breast and lung   Stroke Mother 56   Hypothyroidism Mother    Cancer Father        prostate   Heart attack Father 65   Alcohol abuse Maternal Grandmother    Alcohol abuse Maternal Grandfather    Allergies Other        Grandmother-pt states she takes after grandmother who had multiple allergies   Colon cancer Neg Hx    Esophageal cancer Neg Hx    Stomach cancer Neg Hx    Rectal cancer Neg Hx    Past Surgical History:  Procedure Laterality Date   BASAL CELL CARCINOMA EXCISION  2024   CATARACT EXTRACTION, BILATERAL  2024   HAND TENDON SURGERY  02/09/2007   tendon transfer-Dr. Camella   NASAL SEPTUM SURGERY     operated on by Dr. Floy 20 years ago   OVARIAN CYST REMOVAL     Right Sided Salpingo-oopherectomy   SALPINGOOPHORECTOMY     WISDOM TOOTH EXTRACTION     Social History   Social History Narrative   Not on file   Immunization History  Administered Date(s) Administered   Fluad Quad(high Dose 65+) 09/16/2018, 10/03/2019, 09/27/2021   Influenza Split 11/22/2011   Influenza, High Dose Seasonal PF 09/14/2014, 09/07/2015, 10/07/2016, 10/26/2017, 09/16/2018, 09/21/2020   Influenza,inj,Quad PF,6+ Mos 10/05/2013   Influenza,inj,quad,  With Preservative 10/06/2016, 11/02/2017   Influenza-Unspecified 11/22/2011, 10/05/2013, 09/14/2014, 09/07/2015, 10/06/2016, 10/07/2016, 10/26/2017, 11/02/2017   PFIZER(Purple Top)SARS-COV-2 Vaccination 02/11/2019, 03/08/2019, 10/04/2019, 04/20/2020   Pfizer Covid-19 Vaccine Bivalent Booster 9yrs & up 10/08/2020   Pneumococcal Conjugate-13 10/05/2013   Pneumococcal Polysaccharide-23 02/19/2015   Zoster Recombinant(Shingrix) 10/08/2020   Zoster, Live 01/17/2010     Objective: Vital Signs: BP 116/68 (BP Location: Right Arm, Patient Position: Sitting, Cuff Size: Normal)   Pulse 91   Resp 14   Ht 5' 4.5 (1.638 m)   Wt 145 lb (65.8 kg)   BMI 24.50 kg/m    Physical Exam Vitals and nursing note reviewed.  Constitutional:      Appearance: She is well-developed.  HENT:     Head: Normocephalic and atraumatic.  Eyes:  Conjunctiva/sclera: Conjunctivae normal.  Cardiovascular:     Rate and Rhythm: Normal rate and regular rhythm.     Heart sounds: Normal heart sounds.  Pulmonary:     Effort: Pulmonary effort is normal.     Breath sounds: Normal breath sounds.  Abdominal:     General: Bowel sounds are normal.     Palpations: Abdomen is soft.  Musculoskeletal:     Cervical back: Normal range of motion.  Lymphadenopathy:     Cervical: No cervical adenopathy.  Skin:    General: Skin is warm and dry.     Capillary Refill: Capillary refill takes less than 2 seconds.  Neurological:     Mental Status: She is alert and oriented to person, place, and time.  Psychiatric:        Behavior: Behavior normal.      Musculoskeletal Exam: She had limited range of motion of the cervical spine.  Thoracic and lumbar spine were in good range of motion.  She had limited painful range of motion of her right shoulder joint without any effusion.  Elbow joints in good range of motion.  She has synovitis of her bilateral wrist joints, all of her MCPs and PIP joints.  She was unable to make a fist.  Hip  joints were in good range of motion.  She had warmth on palpation of her right knee joint.  She had tenderness and synovitis over her bilateral 2nd and 3rd MTP joints.  CDAI Exam: CDAI Score: 61  Patient Global: 80 / 100; Provider Global: 80 / 100 Swollen: 26 ; Tender: 27  Joint Exam 07/16/2023      Right  Left  Glenohumeral   Tender     Wrist  Swollen Tender  Swollen Tender  MCP 1  Swollen Tender  Swollen Tender  MCP 2  Swollen Tender  Swollen Tender  MCP 3  Swollen Tender  Swollen Tender  MCP 4  Swollen Tender  Swollen Tender  MCP 5  Swollen Tender  Swollen Tender  IP (thumb)     Swollen Tender  PIP 2 (finger)  Swollen Tender  Swollen Tender  PIP 3 (finger)  Swollen Tender  Swollen Tender  PIP 4 (finger)  Swollen Tender  Swollen Tender  PIP 5 (finger)  Swollen Tender  Swollen Tender  Knee  Swollen Tender     MTP 2  Swollen Tender  Swollen Tender  MTP 3  Swollen Tender  Swollen Tender     Investigation: No additional findings.  Imaging: CT CHEST HIGH RESOLUTION Result Date: 07/07/2023 CLINICAL DATA:  Sjogren syndrome. EXAM: CT CHEST WITHOUT CONTRAST TECHNIQUE: Multidetector CT imaging of the chest was performed following the standard protocol without intravenous contrast. High resolution imaging of the lungs, as well as inspiratory and expiratory imaging, was performed. RADIATION DOSE REDUCTION: This exam was performed according to the departmental dose-optimization program which includes automated exposure control, adjustment of the mA and/or kV according to patient size and/or use of iterative reconstruction technique. COMPARISON:  05/03/2022. FINDINGS: Cardiovascular: Atherosclerotic calcification of the aorta, aortic valve and coronary arteries. Enlarged pulmonic trunk and heart. No pericardial effusion. Aberrant right subclavian artery. Mediastinum/Nodes: Small mediastinal lymph nodes are unchanged. Hilar regions are difficult to definitively evaluate without IV contrast. No  axillary adenopathy. Esophagus is grossly unremarkable. Lungs/Pleura: Biapical pleuroparenchymal scarring. Bibasilar bronchiectasis and volume loss. Negative for subpleural reticulation, traction bronchiectasis/bronchiolectasis, ground glass, architectural distortion or honeycombing. Trace left pleural effusion. Airway is unremarkable. Tracheobronchomalacia on expiratory phase imaging with  air trapping. Upper Abdomen: Visualized portions of the liver, gallbladder, adrenal glands, left kidney, spleen, pancreas, stomach and bowel are grossly unremarkable. No upper abdominal adenopathy. Musculoskeletal: Degenerative changes in the spine. IMPRESSION: 1. Bibasilar bronchiectasis and volume loss, findings which favor scarring. Difficult to definitively exclude interstitial lung disease. Findings are indeterminate for UIP per consensus guidelines: Diagnosis of Idiopathic Pulmonary Fibrosis: An Official ATS/ERS/JRS/ALAT Clinical Practice Guideline. Am JINNY [employer] Crit Care Med Vol 198, Iss 5, [phone], Sep 06 2016. 2. Trace left pleural effusion. 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 4. Aortic atherosclerosis (ICD10-I70.0). Coronary artery calcification. 5. Enlarged pulmonic trunk, indicative of pulmonary arterial hypertension. Electronically Signed   By: Newell Eke M.D.   On: 07/07/2023 15:50    Recent Labs: Lab Results  Component Value Date   WBC 9.1 03/06/2023   HGB 14.3 03/06/2023   PLT 211 03/06/2023   NA 139 03/06/2023   K 4.1 03/06/2023   CL 105 03/06/2023   CO2 26 03/06/2023   GLUCOSE 104 (H) 03/06/2023   BUN 19 03/06/2023   CREATININE 0.92 03/06/2023   BILITOT 0.9 05/03/2022   ALKPHOS 53 05/03/2022   AST 17 05/03/2022   ALT 9 05/03/2022   PROT 7.8 05/03/2022   ALBUMIN 4.3 05/03/2022   CALCIUM 9.5 03/06/2023   GFRAA >60 12/11/2018   July 08, 2023 ANA 1: 1280 NS, SSA> 8.0, SSB negative, centromere negative, Smith negative, SCL 70 negative, dsDNA  negative, RNP negative, sed rate 50, CRP 2.4, RF negative, anti-CCP 59, CK 60, aldolase 5.7, ANCA negative Speciality Comments: No specialty comments available.  Procedures:  No procedures performed Allergies: Albuterol -budesonide, Amoxicillin -pot clavulanate, Azithromycin , Celecoxib, Ciprofloxacin, Erythromycin, Hydrocodone, Hydrocodone-acetaminophen , Levofloxacin, Omeprazole , Penicillins, Propoxyphene n-acetaminophen , Rofecoxib, Sulfa antibiotics, and Tetanus toxoid   Assessment / Plan:     Visit Diagnoses: Sjogren's syndrome with other organ involvement (HCC)-positive ANA, positive SSA, sicca symptoms, mild ILD.  Patient gives history of dry mouth and dry eyes for at least 15 years.  Detailed counseling on Sjogren's syndrome was provided.  Association of Sjogren's with ILD, arrhythmia, lymphoma was discussed.  Rheumatoid arthritis of multiple sites with negative rheumatoid factor (HCC)-RF negative, anti-CCP positive.  Patient developed severe inflammatory arthritis involving her hands and feet and other joints as described above.  She is having difficulty getting dressed and making a fist.  Detailed counseling on rheumatoid arthritis was provided.  As a bridging therapy I will start him on prednisone  15 mg p.o. daily and taper by 5 mg every week.  I discussed current situation with Dr. Kara.  She has mild ILD.  Her symptoms of inflammatory arthritis will not improve on CellCept or Imuran.  I discussed possible use of methotrexate.  He was in agreement to start on methotrexate.  He recommended close monitoring of his pulmonary functions.  He will closely follow-up with Dr. Kara.  I plan to start her on  High risk medication use - Plan: CBC with Differential/Platelet, Comprehensive metabolic panel with GFR, Hepatitis B core antibody, IgM, Hepatitis B surface antigen, Hepatitis C antibody, QuantiFERON-TB Gold Plus, Serum protein electrophoresis with reflex, IgG, IgA, IgM, Glucose 6 phosphate  dehydrogenase, Thiopurine methyltransferase(tpmt)rbc  Current chronic use of systemic steroids -patient had recent course of prednisone  for pneumonia.  Plan: Hemoglobin A1c  Raynaud's disease without gangrene-she gives history of Raynaud's phenomenon.  No nailbed capillary changes or sclerodactyly was noted.  She had good capillary refill.  Bronchiectasis without complication (HCC)-she gives history of frequent pneumonia and had  3-4 episodes in the last 2 years per patient.  Increased risk of infections with immunosuppression was discussed.  ILD (interstitial lung disease) (HCC)-mild ILD noted on the high-resolution CT.  She had crackles in the lung bases.  Pain in both hands -she had severe inflammatory arthritis involving bilateral hands with difficulty making a fist.  Synovitis was noted over wrist joints, MCPs and PIP joints.  Plan: XR Hand 2 View Right, XR Hand 2 View Left.  X-rays of bilateral hands with history of osteoarthritis.  Closed fracture of left wrist, sequela-20 years ago  Pain in both feet -she had synovitis and tenderness over bilateral 2nd and 3rd MTPs.  Plan: XR Foot 2 Views Right, XR Foot 2 Views Left.  X-rays of bilateral feet were sensitive of osteoarthritis.  Primary hypertension-blood pressure was normal today.  Screening for diabetes mellitus - Plan: Hemoglobin A1c  Gastroesophageal reflux disease with esophagitis without hemorrhage-she gets frequent reflux symptoms.  Esophageal stricture  Liver hemangioma  Other specified hypothyroidism  Rosacea  Cardiomegaly  Other chronic sinusitis  History of migraine  Anxiety and depression  Orders: Orders Placed This Encounter  Procedures   XR Hand 2 View Right   XR Hand 2 View Left   XR Foot 2 Views Right   XR Foot 2 Views Left   CBC with Differential/Platelet   Comprehensive metabolic panel with GFR   Hemoglobin A1c   Hepatitis B core antibody, IgM   Hepatitis B surface antigen   Hepatitis C  antibody   QuantiFERON-TB Gold Plus   Serum protein electrophoresis with reflex   IgG, IgA, IgM   Glucose 6 phosphate dehydrogenase   Thiopurine methyltransferase(tpmt)rbc   No orders of the defined types were placed in this encounter.   Face-to-face time spent with patient was over 65 minutes. Greater than 50% of time was spent in counseling and coordination of care.  Total time was spent was over 75 minutes.  Follow-Up Instructions: Return for ILD, RA, Sjogren's.   Maya Nash, MD  Note - This record has been created using Animal nutritionist.  Chart creation errors have been sought, but may not always  have been located. Such creation errors do not reflect on  the standard of medical care.

## 2023-07-15 LAB — HYPERSENSITIVITY PNEUMONITIS
A. Pullulans Abs: NEGATIVE
A.Fumigatus #1 Abs: NEGATIVE
Micropolyspora faeni, IgG: NEGATIVE
Pigeon Serum Abs: NEGATIVE
Thermoact. Saccharii: NEGATIVE
Thermoactinomyces vulgaris, IgG: NEGATIVE

## 2023-07-15 LAB — RNP ANTIBODIES: ENA RNP Ab: <0.2 AI (ref 0.0–0.9)

## 2023-07-15 NOTE — Telephone Encounter (Signed)
 Thank you for getting her in so soon. I would recommend cellcept therapy for her bronchiectasis and mild ILD findings.   Thanks again, [employer]

## 2023-07-16 ENCOUNTER — Ambulatory Visit

## 2023-07-16 ENCOUNTER — Encounter: Payer: Self-pay | Admitting: Rheumatology

## 2023-07-16 ENCOUNTER — Ambulatory Visit: Attending: Rheumatology | Admitting: Rheumatology

## 2023-07-16 ENCOUNTER — Other Ambulatory Visit: Payer: Self-pay | Admitting: Family Medicine

## 2023-07-16 ENCOUNTER — Telehealth: Payer: Self-pay | Admitting: Pharmacist

## 2023-07-16 VITALS — BP 116/68 | HR 91 | Resp 14 | Ht 64.5 in | Wt 145.0 lb

## 2023-07-16 DIAGNOSIS — M79672 Pain in left foot: Secondary | ICD-10-CM | POA: Insufficient documentation

## 2023-07-16 DIAGNOSIS — L719 Rosacea, unspecified: Secondary | ICD-10-CM | POA: Insufficient documentation

## 2023-07-16 DIAGNOSIS — S62102S Fracture of unspecified carpal bone, left wrist, sequela: Secondary | ICD-10-CM | POA: Insufficient documentation

## 2023-07-16 DIAGNOSIS — M3509 Sicca syndrome with other organ involvement: Secondary | ICD-10-CM | POA: Insufficient documentation

## 2023-07-16 DIAGNOSIS — D1803 Hemangioma of intra-abdominal structures: Secondary | ICD-10-CM | POA: Insufficient documentation

## 2023-07-16 DIAGNOSIS — J328 Other chronic sinusitis: Secondary | ICD-10-CM | POA: Insufficient documentation

## 2023-07-16 DIAGNOSIS — E038 Other specified hypothyroidism: Secondary | ICD-10-CM | POA: Diagnosis not present

## 2023-07-16 DIAGNOSIS — Z79899 Other long term (current) drug therapy: Secondary | ICD-10-CM | POA: Insufficient documentation

## 2023-07-16 DIAGNOSIS — I517 Cardiomegaly: Secondary | ICD-10-CM | POA: Insufficient documentation

## 2023-07-16 DIAGNOSIS — M79671 Pain in right foot: Secondary | ICD-10-CM

## 2023-07-16 DIAGNOSIS — F32A Depression, unspecified: Secondary | ICD-10-CM | POA: Insufficient documentation

## 2023-07-16 DIAGNOSIS — M0609 Rheumatoid arthritis without rheumatoid factor, multiple sites: Secondary | ICD-10-CM | POA: Insufficient documentation

## 2023-07-16 DIAGNOSIS — F419 Anxiety disorder, unspecified: Secondary | ICD-10-CM | POA: Diagnosis not present

## 2023-07-16 DIAGNOSIS — M5030 Other cervical disc degeneration, unspecified cervical region: Secondary | ICD-10-CM | POA: Diagnosis not present

## 2023-07-16 DIAGNOSIS — K2100 Gastro-esophageal reflux disease with esophagitis, without bleeding: Secondary | ICD-10-CM | POA: Diagnosis not present

## 2023-07-16 DIAGNOSIS — M79642 Pain in left hand: Secondary | ICD-10-CM | POA: Insufficient documentation

## 2023-07-16 DIAGNOSIS — J479 Bronchiectasis, uncomplicated: Secondary | ICD-10-CM | POA: Insufficient documentation

## 2023-07-16 DIAGNOSIS — Z7952 Long term (current) use of systemic steroids: Secondary | ICD-10-CM | POA: Diagnosis not present

## 2023-07-16 DIAGNOSIS — J849 Interstitial pulmonary disease, unspecified: Secondary | ICD-10-CM | POA: Insufficient documentation

## 2023-07-16 DIAGNOSIS — M79641 Pain in right hand: Secondary | ICD-10-CM | POA: Diagnosis not present

## 2023-07-16 DIAGNOSIS — Z131 Encounter for screening for diabetes mellitus: Secondary | ICD-10-CM | POA: Diagnosis not present

## 2023-07-16 DIAGNOSIS — Z8669 Personal history of other diseases of the nervous system and sense organs: Secondary | ICD-10-CM | POA: Diagnosis not present

## 2023-07-16 DIAGNOSIS — I10 Essential (primary) hypertension: Secondary | ICD-10-CM | POA: Diagnosis not present

## 2023-07-16 DIAGNOSIS — I7300 Raynaud's syndrome without gangrene: Secondary | ICD-10-CM | POA: Diagnosis not present

## 2023-07-16 DIAGNOSIS — K219 Gastro-esophageal reflux disease without esophagitis: Secondary | ICD-10-CM

## 2023-07-16 DIAGNOSIS — K222 Esophageal obstruction: Secondary | ICD-10-CM | POA: Insufficient documentation

## 2023-07-16 MED ORDER — PREDNISONE 5 MG PO TABS: ORAL_TABLET | ORAL | 0 refills | Status: AC

## 2023-07-16 NOTE — Progress Notes (Signed)
 Pharmacy Note   Subjective: Patient presents today to [facility] for follow up office visit. Patient seen by the pharmacist for counseling on methotrexate for Sjogren's syndrome and possible RA.  Discussed medication options with Dr. Dolphus and Dr.Dewald. 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 Cellcept and azathioprine will be considered as second line in the event that methotrexate fails.   Objective: CBC Labs (Brief)          Component Value Date/Time    WBC 9.1 03/06/2023 0937    RBC 4.70 03/06/2023 0937    HGB 14.3 03/06/2023 0937    HCT 43.0 03/06/2023 0937    PLT 211 03/06/2023 0937    MCV 91.5 03/06/2023 0937    MCV 95.4 08/05/2012 2021    MCH 30.4 03/06/2023 0937    MCHC 33.3 03/06/2023 0937    RDW 13.8 03/06/2023 0937    LYMPHSABS 0.9 05/03/2022 1057    MONOABS 0.6 05/03/2022 1057    EOSABS 0.1 05/03/2022 1057    BASOSABS 0.0 05/03/2022 1057        CMP     Labs (Brief)          Component Value Date/Time    NA 139 03/06/2023 0937    K 4.1 03/06/2023 0937    CL 105 03/06/2023 0937    CO2 26 03/06/2023 0937    GLUCOSE 104 (H) 03/06/2023 0937    BUN 19 03/06/2023 0937    CREATININE 0.92 03/06/2023 0937    CALCIUM 9.5 03/06/2023 0937    PROT 7.8 05/03/2022 1057    ALBUMIN 4.3 05/03/2022 1057    AST 17 05/03/2022 1057    ALT 9 05/03/2022 1057    ALKPHOS 53 05/03/2022 1057    BILITOT 0.9 05/03/2022 1057    GFRNONAA >60 03/06/2023 0937    GFRAA >60 12/11/2018 1319        Baseline Immunosuppressant Therapy Labs TB GOLD Hepatitis Panel     Latest Ref Rng & Units 05/20/2017    9:54 AM  Hepatitis  Hep C Ab NON-REACTI NON-REACTIVE     HIV Recent Labs  No results found for: HIV   Immunoglobulins SPEP     Latest Ref Rng & Units 05/03/2022   10:57 AM  Serum Protein Electrophoresis  Total Protein 6.5 - 8.1 g/dL  7.8     H3EI Recent Labs  No results found for: G6PDH   TPMT Recent Labs  No results found for: TPMT      Chest-xray:    Bibasilar bronchiectasis and volume loss, findings which favor scarring. Difficult to definitively exclude interstitial lung disease. Findings are indeterminate for UIP per consensus guidelines   Contraception: N/A   Alcohol use: Reports zero alcohol intake   Assessment/Plan:  Patient counseled on Methotrexate, Cellcept, and Azathioprine. Handouts with information provided to patient for all three medications. Discussed medication options with Dr. Dolphus and Dr.Dewald. 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 Cellcept and azathioprine will be considered as second line in the event that methotrexate fails.   Patient was counseled on the purpose, proper use, and adverse effects of methotrexate including nausea, infection, and signs and symptoms of pneumonitis. Discussed that there is the possibility of an increased risk of malignancy, specifically lymphomas, but it is not well understood if this increased risk is due to the  medication or the disease state.  Instructed patient that medication should be held for infection and prior to surgery.  Advised patient to avoid live vaccines. Recommend annual influenza, Pneumovax 23, Prevnar 13, and Shingrix as indicated.    Reviewed instructions with patient to take methotrexate weekly along with folic acid daily.  Discussed the importance of frequent monitoring of kidney and liver function and blood counts, and provided patient with standing lab instructions.  Counseled patient to avoid NSAIDs and alcohol while on methotrexate.  Provided patient with educational materials on methotrexate and answered all questions.   Patient voiced understanding.  Patient consented to methotrexate use.  Will upload into  chart.     Dose of methotrexate will be 15mg  PO once weekly along with folic acid 2mg  PO daily for 2 weeks. Repeat CBC/CMP. If labs stable increase methotrexate to 20mg  PO once weekly along with folic acid 2mg  PO daily.  Predisone taper sent to pharmacy today. Prescription pending baseline labs.

## 2023-07-16 NOTE — Progress Notes (Deleted)
 Pharmacy Note  Subjective: Patient presents today to [facility] for follow up office visit. Patient seen by the pharmacist for counseling on methotrexate for Sjogren's syndrome and possible RA.  Discussed medication options with Dr. Dolphus and Dr.Dewald. 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 Cellcept and azathioprine will be considered as second line in the event that methotrexate fails.  Objective: CBC    Component Value Date/Time   WBC 9.1 03/06/2023 0937   RBC 4.70 03/06/2023 0937   HGB 14.3 03/06/2023 0937   HCT 43.0 03/06/2023 0937   PLT 211 03/06/2023 0937   MCV 91.5 03/06/2023 0937   MCV 95.4 08/05/2012 2021   MCH 30.4 03/06/2023 0937   MCHC 33.3 03/06/2023 0937   RDW 13.8 03/06/2023 0937   LYMPHSABS 0.9 05/03/2022 1057   MONOABS 0.6 05/03/2022 1057   EOSABS 0.1 05/03/2022 1057   BASOSABS 0.0 05/03/2022 1057    CMP     Component Value Date/Time   NA 139 03/06/2023 0937   K 4.1 03/06/2023 0937   CL 105 03/06/2023 0937   CO2 26 03/06/2023 0937   GLUCOSE 104 (H) 03/06/2023 0937   BUN 19 03/06/2023 0937   CREATININE 0.92 03/06/2023 0937   CALCIUM 9.5 03/06/2023 0937   PROT 7.8 05/03/2022 1057   ALBUMIN 4.3 05/03/2022 1057   AST 17 05/03/2022 1057   ALT 9 05/03/2022 1057   ALKPHOS 53 05/03/2022 1057   BILITOT 0.9 05/03/2022 1057   GFRNONAA >60 03/06/2023 0937   GFRAA >60 12/11/2018 1319    Baseline Immunosuppressant Therapy Labs TB GOLD   Hepatitis Panel    Latest Ref Rng & Units 05/20/2017    9:54 AM  Hepatitis  Hep C Ab NON-REACTI NON-REACTIVE    HIV No results found for: HIV Immunoglobulins   SPEP    Latest Ref Rng & Units 05/03/2022   10:57 AM  Serum Protein Electrophoresis  Total Protein 6.5 - 8.1 g/dL 7.8    H3EI No results found for: G6PDH TPMT No results found for: TPMT   Chest-xray:     Bibasilar bronchiectasis and volume loss, findings which favor scarring. Difficult to definitively exclude interstitial lung disease. Findings are indeterminate for UIP per consensus guidelines  Contraception: N/A  Alcohol use: Reports zero alcohol intake  Assessment/Plan:  Patient counseled on Methotrexate, Cellcept, and Azathioprine. Handouts with information provided to patient for all three medications. Discussed medication options with Dr. Dolphus and Dr.Dewald. 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 Cellcept and azathioprine will be considered as second line in the event that methotrexate fails.  Patient was counseled on the purpose, proper use, and adverse effects of methotrexate including nausea, infection, and signs and symptoms of pneumonitis. Discussed that there is the possibility of an increased risk of malignancy, specifically lymphomas, but it is not well understood if this increased risk is due to the medication or the disease state.  Instructed patient that medication should be held for infection and prior to surgery.  Advised patient to avoid live vaccines. Recommend annual influenza, Pneumovax 23, Prevnar 13, and Shingrix as indicated.   Reviewed instructions with patient to take methotrexate weekly along with folic acid daily.  Discussed the importance of frequent monitoring of kidney and liver function and blood counts, and provided patient with standing lab instructions.  Counseled patient to avoid  NSAIDs and alcohol while on methotrexate.  Provided patient with educational materials on methotrexate and answered all questions.   Patient voiced understanding.  Patient consented to methotrexate use.  Will upload into chart.    Dose of methotrexate will be 15mg  PO once weekly along with folic acid 2mg  PO daily for 2 weeks. Repeat CBC/CMP. If labs stable  increase methotrexate to 20mg  PO once weekly along with folic acid 2mg  PO daily.  Predisone taper sent to pharmacy today. Prescription pending baseline labs.

## 2023-07-16 NOTE — Patient Instructions (Addendum)
 Prednisone  Tablets What is this medication? PREDNISONE  (PRED ni sone) treats many conditions such as asthma, allergic reactions, arthritis, inflammatory bowel diseases, adrenal, and blood or bone marrow disorders. It works by decreasing inflammation, slowing down an overactive immune system, or replacing cortisol normally made in the body. Cortisol is a hormone that plays an important role in how the body responds to stress, illness, and injury. It belongs to a group of medications called steroids. This medicine may be used for other purposes; ask your health care provider or pharmacist if you have questions. COMMON BRAND NAME(S): Deltasone , Predone, Sterapred, Sterapred DS What should I tell my care team before I take this medication? They need to know if you have any of these conditions: Cushing's syndrome Diabetes Glaucoma Heart disease High blood pressure Infection (especially a virus infection such as chickenpox, cold sores, or herpes) Kidney disease Liver disease Mental illness Myasthenia gravis Osteoporosis Seizures Stomach or intestine problems Thyroid  disease An unusual or allergic reaction to lactose, prednisone , other medications, foods, dyes, or preservatives Pregnant or trying to get pregnant Breast-feeding How should I use this medication? Take this medication by mouth with a glass of water. Follow the directions on the prescription label. Take this medication with food. If you are taking this medication once a day, take it in the morning. Do not take more medication than you are told to take. Do not suddenly stop taking your medication because you may develop a severe reaction. Your care team will tell you how much medication to take. If your care team wants you to stop the medication, the dose may be slowly lowered over time to avoid any side effects. Talk to your care team about the use of this medication in children. Special care may be needed. Overdosage: If you think  you have taken too much of this medicine contact a poison control center or emergency room at once. NOTE: This medicine is only for you. Do not share this medicine with others. What if I miss a dose? If you miss a dose, take it as soon as you can. If it is almost time for your next dose, talk to your care team. You may need to miss a dose or take an extra dose. Do not take double or extra doses without advice. What may interact with this medication? Do not take this medication with any of the following: Metyrapone Mifepristone This medication may also interact with the following: Aminoglutethimide Amphotericin B Aspirin and aspirin-like medications Barbiturates Certain medications for diabetes, like glipizide or glyburide Cholestyramine Cholinesterase inhibitors Cyclosporine Digoxin Diuretics Ephedrine Female hormones, like estrogens and birth control pills Isoniazid Ketoconazole NSAIDS, medications for pain and inflammation, like ibuprofen or naproxen Phenytoin Rifampin Toxoids Vaccines Warfarin This list may not describe all possible interactions. Give your health care provider a list of all the medicines, herbs, non-prescription drugs, or dietary supplements you use. Also tell them if you smoke, drink alcohol, or use illegal drugs. Some items may interact with your medicine. What should I watch for while using this medication? Visit your care team for regular checks on your progress. If you are taking this medication over a prolonged period, carry an identification card with your name and address, the type and dose of your medication, and your care team's name and address. This medication may increase your risk of getting an infection. Tell your care team if you are around anyone with measles or chickenpox, or if you develop sores or blisters that do not heal properly.  If you are going to have surgery, tell your care team that you have taken this medication within the last twelve  months. Ask your care team about your diet. You may need to lower the amount of salt you eat. This medication may increase blood sugar. Ask your care team if changes in diet or medications are needed if you have diabetes. What side effects may I notice from receiving this medication? Side effects that you should report to your care team as soon as possible: Allergic reactions--skin rash, itching, hives, swelling of the face, lips, tongue, or throat Cushing syndrome--increased fat around the midsection, upper back, neck, or face, pink or purple stretch marks on the skin, thinning, fragile skin that easily bruises, unexpected hair growth High blood sugar (hyperglycemia)--increased thirst or amount of urine, unusual weakness or fatigue, blurry vision Increase in blood pressure Infection--fever, chills, cough, sore throat, wounds that don't heal, pain or trouble when passing urine, general feeling of discomfort or being unwell Low adrenal gland function--nausea, vomiting, loss of appetite, unusual weakness or fatigue, dizziness Mood and behavior changes--anxiety, nervousness, confusion, hallucinations, irritability, hostility, thoughts of suicide or self-harm, worsening mood, feelings of depression Stomach bleeding--bloody or black, tar-like stools, vomiting blood or brown material that looks like coffee grounds Swelling of the ankles, hands, or feet Side effects that usually do not require medical attention (report to your care team if they continue or are bothersome): Acne General discomfort and fatigue Headache Increase in appetite Nausea Trouble sleeping Weight gain This list may not describe all possible side effects. Call your doctor for medical advice about side effects. You may report side effects to FDA at 1-800-FDA-1088. Where should I keep my medication? Keep out of the reach of children. Store at room temperature between 15 and 30 degrees C (59 and 86 degrees F). Protect from light.  Keep container tightly closed. Throw away any unused medication after the expiration date. NOTE: This sheet is a summary. It may not cover all possible information. If you have questions about this medicine, talk to your doctor, pharmacist, or health care provider.  2024 Elsevier/Gold Standard (2020-03-23 00:00:00) Methotrexate Tablets What is this medication? METHOTREXATE (METH oh TREX ate) treats autoimmune conditions, such as arthritis and psoriasis. It works by decreasing inflammation, which can reduce pain and prevent long-term injury to the joints and skin. It may also be used to treat some types of cancer. It works by slowing down the growth of cancer cells. This medicine may be used for other purposes; ask your health care provider or pharmacist if you have questions. COMMON BRAND NAME(S): Rheumatrex, Trexall What should I tell my care team before I take this medication? They need to know if you have any of these conditions: Dehydration Diabetes Fluid in the stomach area or lungs Frequently drink alcohol Having surgery, including dental surgery High cholesterol Immune system problems Inflammatory bowel disease, such as ulcerative colitis Kidney disease Liver disease Low blood cell levels (white cells, red cells, and platelets) Lung disease Recent or ongoing radiation Recent or upcoming vaccine Stomach ulcers, other stomach or intestine problems An unusual or allergic reaction to methotrexate, other medications, foods, dyes, or preservatives Pregnant or trying to get pregnant Breastfeeding How should I use this medication? Take this medication by mouth with water. Take it as directed on the prescription label. Do not take extra. Keep taking this medication until your care team tells you to stop. Know why you are taking this medication and how you should take it.  To treat conditions such as arthritis and psoriasis, this medication is taken ONCE A WEEK as a single dose or divided  into 3 smaller doses taken 12 hours apart (do not take more than 3 doses 12 hours apart each week). This medication is NEVER taken daily to treat conditions other than cancer. Taking this medication more often than directed can cause serious side effects, even death. Talk to your care team about why you are taking this medication, how often you will take it, and what your dose is. Ask your care team to put the reason you take this medication on the prescription. If you take this medication ONCE A WEEK, choose a day of the week before you start. Ask your pharmacist to include the day of the week on the label. Avoid Monday, which could be misread as Morning. Handling this medication may be harmful. Talk to your care team about how to handle this medication. Special instructions may apply. Talk to your care team about the use of this medication in children. While it may be prescribed for selected conditions, precautions do apply. Overdosage: If you think you have taken too much of this medicine contact a poison control center or emergency room at once. NOTE: This medicine is only for you. Do not share this medicine with others. What if I miss a dose? If you miss a dose, talk with your care team. Do not take double or extra doses. What may interact with this medication? Do not take this medication with any of the following: Acitretin Live virus vaccines Probenecid This medication may also interact with the following: Alcohol Aspirin and aspirin-like medications Certain antibiotics, such as penicillin, neomycin, sulfamethoxazole; trimethoprim Certain medications for stomach problems, such as lansoprazole, omeprazole , pantoprazole  Clozapine Cyclosporine Dapsone Folic acid Foscarnet NSAIDs, medications for pain and inflammation, such as ibuprofen or naproxen Phenytoin Pyrimethamine Steroid medications, such as prednisone  or cortisone Tacrolimus Theophylline This list may not describe all  possible interactions. Give your health care provider a list of all the medicines, herbs, non-prescription drugs, or dietary supplements you use. Also tell them if you smoke, drink alcohol, or use illegal drugs. Some items may interact with your medicine. What should I watch for while using this medication? Visit your care team for regular checks on your progress. It may be some time before you see the benefit from this medication. You may need blood work done while you are taking this medication. If your care team has also prescribed folic acid, they may instruct you to skip your folic acid dose on the day you take methotrexate. This medication can make you more sensitive to the sun. Keep out of the sun. If you cannot avoid being in the sun, wear protective clothing and sunscreen. Do not use sun lamps, tanning beds, or tanning booths. Check with your care team if you have severe diarrhea, nausea, and vomiting, or if you sweat a lot. The loss of too much body fluid may make it dangerous for you to take this medication. This medication may increase your risk of getting an infection. Call your care team for advice if you get a fever, chills, sore throat, or other symptoms of a cold or flu. Do not treat yourself. Try to avoid being around people who are sick. Talk to your care team about your risk of cancer. You may be more at risk for certain types of cancers if you take this medication. Talk to your care team if you or your partner may  be pregnant. Serious birth defects can occur if you take this medication during pregnancy and for 6 months after the last dose. You will need a negative pregnancy test before starting this medication. Contraception is recommended while taking this medication and for 6 months after the last dose. Your care team can help you find the option that works for you. If your partner can get pregnant, use a condom during sex while taking this medication and for 3 months after the last  dose. Do not breastfeed while taking this medication and for 1 week after the last dose. This medication may cause infertility. Talk to your care team if you are concerned about your fertility. What side effects may I notice from receiving this medication? Side effects that you should report to your care team as soon as possible: Allergic reactions--skin rash, itching, hives, swelling of the face, lips, tongue, or throat Dry cough, shortness of breath or trouble breathing Infection--fever, chills, cough, sore throat, wounds that don't heal, pain or trouble when passing urine, general feeling of discomfort or being unwell Kidney injury--decrease in the amount of urine, swelling of the ankles, hands, or feet Liver injury--right upper belly pain, loss of appetite, nausea, light-colored stool, dark yellow or brown urine, yellowing skin or eyes, unusual weakness or fatigue Low red blood cell level--unusual weakness or fatigue, dizziness, headache, trouble breathing Pain, tingling, or numbness in the hands or feet, muscle weakness, change in vision, confusion or trouble speaking, loss of balance or coordination, trouble walking, seizures Redness, blistering, peeling, or loosening of the skin, including inside the mouth Stomach bleeding--bloody or black, tar-like stools, vomiting blood or brown material that looks like coffee grounds Stomach pain that is severe, does not go away, or gets worse Unusual bruising or bleeding Side effects that usually do not require medical attention (report these to your care team if they continue or are bothersome): Diarrhea Dizziness Hair loss Nausea Pain, redness, or swelling with sores inside the mouth or throat Skin reactions on sun-exposed areas Vomiting This list may not describe all possible side effects. Call your doctor for medical advice about side effects. You may report side effects to FDA at 1-800-FDA-1088. Where should I keep my medication? Keep out of  the reach of children and pets. Store at room temperature between 20 and 25 degrees C (68 and 77 degrees F). Protect from light. Keep the container tightly closed. Get rid of any unused medication after the expiration date. To get rid of medications that are no longer needed or have expired: Take the medication to a medication take-back program. Check with your pharmacy or law enforcement to find a location. If you cannot return the medication, ask your pharmacist or care team how to get rid of this medication safely. NOTE: This sheet is a summary. It may not cover all possible information. If you have questions about this medicine, talk to your doctor, pharmacist, or health care provider.  2024 Elsevier/Gold Standard (2022-12-05 00:00:00)  Sjogren's Syndrome Sjgren's syndrome is an inflammatory disease in which the body's disease-fighting system (immune system) attacks the glands that produce tears (lacrimal glands) and the glands that produce saliva (salivary glands). This makes the eyes and mouth very dry. Sjgren's syndrome can also affect other parts of the body, causing dryness of the skin, nose, throat, and vagina. Sjgren's syndrome is a long-term (chronic) disorder that has no cure. In some cases, it is linked to other disorders (rheumatic disorders), such as rheumatoid arthritis and systemic lupus erythematosus (  SLE). It may affect other parts of the body, such as the: Blood vessels. Joints. Lungs. Kidneys. Liver or pancreas. Brain, nerves, or spinal cord. What are the causes? The cause of this condition is not known. It may be passed along from parent to child (inherited), or it may be a symptom of a rheumatic disorder. What increases the risk? This condition is more likely to develop in: Women. People who are 27-13 years old and older. People who have recently had a viral infection or currently have a viral infection. What are the signs or symptoms? The main symptoms of this  condition are: Dry mouth. This may include: A chalky feeling. Difficulty swallowing, speaking, or tasting. Frequent cavities in the teeth. Frequent mouth infections. Dry eyes. This may include: Burning, redness, and itching. Blurry vision. Fluctuating vision. Light sensitivity. Other symptoms may include: Dryness of the skin and the inside of the nose. Eyelid infections. Vaginal dryness (if applicable). Joint pain and stiffness. Muscle pain and stiffness. How is this diagnosed? This condition is diagnosed based on: Your symptoms. Your medical history. A physical exam of your eyes and mouth. Tests, including: A Schirmer test. This tests your tear production. An eye exam that is done with a magnifying device (slit-lamp exam). An eye test that temporarily stains your eye with special dyes. This shows the extent of eye damage. Tests to check your salivary gland function. Biopsy. This is a removal of part of a salivary gland from inside your lower lip to be studied under a microscope. Chest X-rays. Blood or urine tests. How is this treated? There is no cure for this condition, but treatment can help you manage your symptoms. You may be asked to see a rheumatologist for further evaluation and treatment. This condition may be treated with: Medicines to help relieve pain and stiffness. Medicines to help relieve inflammation in your body (corticosteroids). These are usually for severe cases. Medicines to help reduce the activity of your immune system (immunosuppressants). These are usually prescribed by your health care provider or a rheumatologist. Moisture replacement therapies to help relieve dryness in your skin, mouth, and eyes. Dry eyes may be treated with: Eye drops or nasal sprays to improve dryness of the eyes. Surgery or insertion of plugs to close the lacrimal glands (punctal occlusion). This helps keep more natural tears in your eyes. Soft contact lenses or hard scleral  lenses. These are occasionally used to protect the surface of the eye. Biologic lubricating eye drops (serum tears). These are eye drops made from a person's own blood. They are used in some people with severe dry eye. Follow these instructions at home: Eye care  Use eye drops and other medicines as told by your health care provider. Protect your eyes from the sun and wind with sunglasses or glasses. Blink at least 5-6 times a minute. Maintain properly humidified air. You may want to use a humidifier at home and at work. Avoid smoke. Mouth care Brush your teeth and floss after every meal. Chew sugar-free gum or suck on hard candy. This may help to relieve dry mouth. Use antimicrobial mouthwash daily. Take frequent sips of water or sugar-free drinks. Use saliva substitutes or lip balm as told by your health care provider. See your dentist every 6 months. General instructions  Take over-the-counter and prescription medicines only as told by your health care provider. Drink enough fluid to keep your urine pale yellow. Keep all follow-up visits. This is important. Contact a health care provider if: You  have a fever. You have night sweats. You are always tired. You have unexplained weight loss. You develop itchy skin. You have red patches on your skin. You have a lump or swelling on your neck. Get help right away if: You develop severe eye pain. You develop sudden decreased vision. Summary Sjgren's syndrome is a disease in which the body's immune system attacks the glands that produce tears and the glands that produce saliva. This condition makes the eyes and mouth very dry. Sjgren's syndrome is a long-term (chronic) disorder. There is no cure for this condition, but treatment can help you manage your symptoms. The cause of this condition is not known. You may be asked to see a rheumatologist for further evaluation and treatment. This information is not intended to replace advice  given to you by your health care provider. Make sure you discuss any questions you have with your health care provider. Document Revised: 07/23/2020 Document Reviewed: 07/23/2020 Elsevier Patient Education  2024 [employer] Methotrexate Tablets What is this medication? METHOTREXATE (METH oh TREX ate) treats autoimmune conditions, such as arthritis and psoriasis. It works by decreasing inflammation, which can reduce pain and prevent long-term injury to the joints and skin. It may also be used to treat some types of cancer. It works by slowing down the growth of cancer cells. This medicine may be used for other purposes; ask your health care provider or pharmacist if you have questions. COMMON BRAND NAME(S): Rheumatrex, Trexall What should I tell my care team before I take this medication? They need to know if you have any of these conditions: Dehydration Diabetes Fluid in the stomach area or lungs Frequently drink alcohol Having surgery, including dental surgery High cholesterol Immune system problems Inflammatory bowel disease, such as ulcerative colitis Kidney disease Liver disease Low blood cell levels (white cells, red cells, and platelets) Lung disease Recent or ongoing radiation Recent or upcoming vaccine Stomach ulcers, other stomach or intestine problems An unusual or allergic reaction to methotrexate, other medications, foods, dyes, or preservatives Pregnant or trying to get pregnant Breastfeeding How should I use this medication? Take this medication by mouth with water. Take it as directed on the prescription label. Do not take extra. Keep taking this medication until your care team tells you to stop. Know why you are taking this medication and how you should take it. To treat conditions such as arthritis and psoriasis, this medication is taken ONCE A WEEK as a single dose or divided into 3 smaller doses taken 12 hours apart (do not take more than 3 doses 12 hours apart  each week). This medication is NEVER taken daily to treat conditions other than cancer. Taking this medication more often than directed can cause serious side effects, even death. Talk to your care team about why you are taking this medication, how often you will take it, and what your dose is. Ask your care team to put the reason you take this medication on the prescription. If you take this medication ONCE A WEEK, choose a day of the week before you start. Ask your pharmacist to include the day of the week on the label. Avoid Monday, which could be misread as Morning. Handling this medication may be harmful. Talk to your care team about how to handle this medication. Special instructions may apply. Talk to your care team about the use of this medication in children. While it may be prescribed for selected conditions, precautions do apply. Overdosage: If you think you have  taken too much of this medicine contact a poison control center or emergency room at once. NOTE: This medicine is only for you. Do not share this medicine with others. What if I miss a dose? If you miss a dose, talk with your care team. Do not take double or extra doses. What may interact with this medication? Do not take this medication with any of the following: Acitretin Live virus vaccines Probenecid This medication may also interact with the following: Alcohol Aspirin and aspirin-like medications Certain antibiotics, such as penicillin, neomycin, sulfamethoxazole; trimethoprim Certain medications for stomach problems, such as lansoprazole, omeprazole , pantoprazole  Clozapine Cyclosporine Dapsone Folic acid Foscarnet NSAIDs, medications for pain and inflammation, such as ibuprofen or naproxen Phenytoin Pyrimethamine Steroid medications, such as prednisone  or cortisone Tacrolimus Theophylline This list may not describe all possible interactions. Give your health care provider a list of all the medicines, herbs,  non-prescription drugs, or dietary supplements you use. Also tell them if you smoke, drink alcohol, or use illegal drugs. Some items may interact with your medicine. What should I watch for while using this medication? Visit your care team for regular checks on your progress. It may be some time before you see the benefit from this medication. You may need blood work done while you are taking this medication. If your care team has also prescribed folic acid, they may instruct you to skip your folic acid dose on the day you take methotrexate. This medication can make you more sensitive to the sun. Keep out of the sun. If you cannot avoid being in the sun, wear protective clothing and sunscreen. Do not use sun lamps, tanning beds, or tanning booths. Check with your care team if you have severe diarrhea, nausea, and vomiting, or if you sweat a lot. The loss of too much body fluid may make it dangerous for you to take this medication. This medication may increase your risk of getting an infection. Call your care team for advice if you get a fever, chills, sore throat, or other symptoms of a cold or flu. Do not treat yourself. Try to avoid being around people who are sick. Talk to your care team about your risk of cancer. You may be more at risk for certain types of cancers if you take this medication. Talk to your care team if you or your partner may be pregnant. Serious birth defects can occur if you take this medication during pregnancy and for 6 months after the last dose. You will need a negative pregnancy test before starting this medication. Contraception is recommended while taking this medication and for 6 months after the last dose. Your care team can help you find the option that works for you. If your partner can get pregnant, use a condom during sex while taking this medication and for 3 months after the last dose. Do not breastfeed while taking this medication and for 1 week after the last  dose. This medication may cause infertility. Talk to your care team if you are concerned about your fertility. What side effects may I notice from receiving this medication? Side effects that you should report to your care team as soon as possible: Allergic reactions--skin rash, itching, hives, swelling of the face, lips, tongue, or throat Dry cough, shortness of breath or trouble breathing Infection--fever, chills, cough, sore throat, wounds that don't heal, pain or trouble when passing urine, general feeling of discomfort or being unwell Kidney injury--decrease in the amount of urine, swelling of the ankles,  hands, or feet Liver injury--right upper belly pain, loss of appetite, nausea, light-colored stool, dark yellow or brown urine, yellowing skin or eyes, unusual weakness or fatigue Low red blood cell level--unusual weakness or fatigue, dizziness, headache, trouble breathing Pain, tingling, or numbness in the hands or feet, muscle weakness, change in vision, confusion or trouble speaking, loss of balance or coordination, trouble walking, seizures Redness, blistering, peeling, or loosening of the skin, including inside the mouth Stomach bleeding--bloody or black, tar-like stools, vomiting blood or brown material that looks like coffee grounds Stomach pain that is severe, does not go away, or gets worse Unusual bruising or bleeding Side effects that usually do not require medical attention (report these to your care team if they continue or are bothersome): Diarrhea Dizziness Hair loss Nausea Pain, redness, or swelling with sores inside the mouth or throat Skin reactions on sun-exposed areas Vomiting This list may not describe all possible side effects. Call your doctor for medical advice about side effects. You may report side effects to FDA at 1-800-FDA-1088. Where should I keep my medication? Keep out of the reach of children and pets. Store at room temperature between 20 and 25  degrees C (68 and 77 degrees F). Protect from light. Keep the container tightly closed. Get rid of any unused medication after the expiration date. To get rid of medications that are no longer needed or have expired: Take the medication to a medication take-back program. Check with your pharmacy or law enforcement to find a location. If you cannot return the medication, ask your pharmacist or care team how to get rid of this medication safely. NOTE: This sheet is a summary. It may not cover all possible information. If you have questions about this medicine, talk to your doctor, pharmacist, or health care provider.  2024 Elsevier/Gold Standard (2022-12-05 00:00:00)  Standing Labs We placed an order today for your standing lab work.   Please have your standing labs drawn in 2 weeks x 2 and then every 3 months  Please have your labs drawn 2 weeks prior to your appointment so that the provider can discuss your lab results at your appointment, if possible.  Please note that you may see your imaging and lab results in MyChart before we have reviewed them. We will contact you once all results are reviewed. Please allow our office up to 72 hours to thoroughly review all of the results before contacting the office for clarification of your results.  WALK-IN LAB HOURS  Monday through Thursday from 8:00 am -12:30 pm and 1:00 pm-4:30 pm and Friday from 8:00 am-12:00 pm.  Patients with office visits requiring labs will be seen before walk-in labs.  You may encounter longer than normal wait times. Please allow additional time. Wait times may be shorter on  Monday and Thursday afternoons.  We do not book appointments for walk-in labs. We appreciate your patience and understanding with our staff.   Labs are drawn by [facility]. Please bring your co-pay at the time of your lab draw.  You may receive a bill from [facility] for your lab work.  Please note if you are on Hydroxychloroquine and and an order has been  placed for a Hydroxychloroquine level,  you will need to have it drawn 4 hours or more after your last dose.  If you wish to have your labs drawn at another location, please call the office 24 hours in advance so we can fax the orders.  The office is located at  [address], [place], [place] [address]   If you have any questions regarding directions or hours of operation,  please call [phone].   As a reminder, please drink plenty of water prior to coming for your lab work. Thanks!   Vaccines You are taking a medication(s) that can suppress your immune system.  The following immunizations are recommended: Flu annually Covid-19  Td/Tdap (tetanus, diphtheria, pertussis) every 10 years Pneumonia (Prevnar 15 then Pneumovax 23 at least 1 year apart.  Alternatively, can take Prevnar 20 without needing additional dose) Shingrix: 2 doses from 4 weeks to 6 months apart  Please check with your PCP to make sure you are up to date.   If you have signs or symptoms of an infection or start antibiotics: First, call your PCP for workup of your infection. Hold your medication through the infection, until you complete your antibiotics, and until symptoms resolve if you take the following: Injectable medication (Actemra, Benlysta, Cimzia, Cosentyx, Enbrel, Humira, Kevzara, Orencia, Remicade, Simponi, Stelara, Taltz, Tremfya) Methotrexate Leflunomide (Arava) Mycophenolate (Cellcept) Earma, Olumiant, or Rinvoq

## 2023-07-16 NOTE — Progress Notes (Deleted)
 Pharmacy Note  Subjective: Patient presents today to the [facility] for follow up office visit.  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  Objective: CBC    Component Value Date/Time   WBC 9.1 03/06/2023 0937   RBC 4.70 03/06/2023 0937   HGB 14.3 03/06/2023 0937   HCT 43.0 03/06/2023 0937   PLT 211 03/06/2023 0937   MCV 91.5 03/06/2023 0937   MCV 95.4 08/05/2012 2021   MCH 30.4 03/06/2023 0937   MCHC 33.3 03/06/2023 0937   RDW 13.8 03/06/2023 0937   LYMPHSABS 0.9 05/03/2022 1057   MONOABS 0.6 05/03/2022 1057   EOSABS 0.1 05/03/2022 1057   BASOSABS 0.0 05/03/2022 1057    CMP     Component Value Date/Time   NA 139 03/06/2023 0937   K 4.1 03/06/2023 0937   CL 105 03/06/2023 0937   CO2 26 03/06/2023 0937   GLUCOSE 104 (H) 03/06/2023 0937   BUN 19 03/06/2023 0937   CREATININE 0.92 03/06/2023 0937   CALCIUM 9.5 03/06/2023 0937   PROT 7.8 05/03/2022 1057   ALBUMIN 4.3 05/03/2022 1057   AST 17 05/03/2022 1057   ALT 9 05/03/2022 1057   ALKPHOS 53 05/03/2022 1057   BILITOT 0.9 05/03/2022 1057   GFRNONAA >60 03/06/2023 0937   GFRAA >60 12/11/2018 1319    Baseline Immunosuppressant Therapy Labs TB GOLD   Hepatitis Panel    Latest Ref Rng & Units 05/20/2017    9:54 AM  Hepatitis  Hep C Ab NON-REACTI NON-REACTIVE    HIV No results found for: HIV Immunoglobulins   SPEP    Latest Ref Rng & Units 05/03/2022   10:57 AM  Serum Protein Electrophoresis  Total Protein 6.5 - 8.1 g/dL 7.8    H3EI No results found for: G6PDH TPMT No results found for: TPMT   Chest-xray:   Bibasilar bronchiectasis and volume loss, findings which facor scarring. Difficult to definitvely exclude intersitial lung disease. Findings are indeterminate for UIP per consensus guidelines.  Contraception: N/A  Assessment/Plan:  Patient was counseled on the purpose, proper use, and adverse  effects of mycophenolate including risk of infection, new or reactivation of viral infections, nausea, and headaches.  Discussed warning of increased risk of development of lymphoma and other malignancies, particularly of the skin.  Discussed risk of neutropenia and discussed importance of regular labs to monitor blood counts, liver function, and kidney function.  Reviewed risk of congenital malformations, and the importance of contraception while on this medication.  Counseled patient on importance of taking PCP prophylaxis while on mycophenolate.  Counseled patient on purpose, proper use, and adverse effects of sulfamethoxazole/trimethoprim three times a week. *** OR *** Due to patient's sulfa allergy, will initiate patient on dapsone 100 mg daily.  Counseled patient on purpose, proper use, and adverse effects of Dapsone.  G6PD ordered today.  Provided patient with educational materials and answered all questions.  Patient consented to mycophenolate.

## 2023-07-16 NOTE — Telephone Encounter (Signed)
 Pending baseline labs from today, patient will be oral MT new start   Dose of methotrexate will be 15mg  PO once weekly along with folic acid 2mg  PO daily for 2 weeks. Repeat CBC/CMP. If labs stable increase methotrexate to 20mg  PO once weekly along with folic acid 2mg  PO daily.   Sherry Pennant, PharmD, MPH, BCPS, CPP Clinical Pharmacist (Rheumatology and Pulmonology)

## 2023-07-20 ENCOUNTER — Ambulatory Visit: Payer: Self-pay | Admitting: Rheumatology

## 2023-07-20 NOTE — Progress Notes (Signed)
 CBC is stable, CMP is normal, hemoglobin A1c is elevated at 6.0, immunoglobulins are elevated most likely due to inflammation, hepatitis B and hepatitis C nonreactive, TB Gold negative, (G6PD, TPMT and IFE pending).  Plan to start on methotrexate  once all the labs are available.

## 2023-07-21 DIAGNOSIS — H6502 Acute serous otitis media, left ear: Secondary | ICD-10-CM | POA: Diagnosis not present

## 2023-07-21 DIAGNOSIS — H6992 Unspecified Eustachian tube disorder, left ear: Secondary | ICD-10-CM | POA: Diagnosis not present

## 2023-07-22 ENCOUNTER — Telehealth: Payer: Self-pay | Admitting: *Deleted

## 2023-07-22 MED ORDER — METHOTREXATE SODIUM 2.5 MG PO TABS
ORAL_TABLET | ORAL | 0 refills | Status: DC
Start: 2023-07-22 — End: 2023-08-21

## 2023-07-22 MED ORDER — FOLIC ACID 1 MG PO TABS: 2.0000 mg | ORAL_TABLET | Freq: Every day | ORAL | 3 refills | Status: AC

## 2023-07-22 NOTE — Telephone Encounter (Signed)
 CBC normal, CMP normal, hemoglobin A1c elevated at 6.0, immunoglobulins elevated, G6PD mildly elevated, hepatitis B nonreactive, hepatitis C nonreactive, TB Gold negative, TPMT normal, IFE pending.  Okay to start of methotrexate  15 mg p.o. weekly along with folic acid  2 mg p.o. daily.  If labs are normal in 2 weeks we can increase methotrexate  to 20 mg p.o. weekly.  Labs will be checked in 2 weeks, 2 months and then every 3 months.

## 2023-07-22 NOTE — Progress Notes (Signed)
 CBC normal, CMP normal, hemoglobin A1c elevated at 6.0, immunoglobulins elevated, G6PD mildly elevated, hepatitis B nonreactive, hepatitis C nonreactive, TB Gold negative, TPMT normal, IFE pending.  Okay to start of methotrexate  15 mg p.o. weekly along with folic acid  2 mg p.o. daily.  If labs are normal in 2 weeks we can increase methotrexate  to 20 mg p.o. weekly.  Labs will be checked in 2 weeks, 2 months and then every 3 months.

## 2023-07-22 NOTE — Telephone Encounter (Signed)
 Patient contacted the office and left message requesting a call back to let her know if she can take Prednisone  and MTX together. Patient advised she may take them together. Patient advised she will want to avoid taking any NSAIDS. Patient expressed understanding.

## 2023-07-23 ENCOUNTER — Other Ambulatory Visit: Payer: Self-pay | Admitting: *Deleted

## 2023-07-23 DIAGNOSIS — R899 Unspecified abnormal finding in specimens from other organs, systems and tissues: Secondary | ICD-10-CM

## 2023-07-23 LAB — CBC WITH DIFFERENTIAL/PLATELET
Absolute Lymphocytes: 935 {cells}/uL (ref 850–3900)
Absolute Monocytes: 546 {cells}/uL (ref 200–950)
Basophils Absolute: 42 {cells}/uL (ref 0–200)
Basophils Relative: 0.4 %
Eosinophils Absolute: 116 {cells}/uL (ref 15–500)
Eosinophils Relative: 1.1 %
HCT: 44.2 % (ref 35.0–45.0)
Hemoglobin: 14.6 g/dL (ref 11.7–15.5)
MCH: 30.2 pg (ref 27.0–33.0)
MCHC: 33 g/dL (ref 32.0–36.0)
MCV: 91.5 fL (ref 80.0–100.0)
MPV: 13.3 fL — ABNORMAL HIGH (ref 7.5–12.5)
Monocytes Relative: 5.2 %
Neutro Abs: 8862 {cells}/uL — ABNORMAL HIGH (ref 1500–7800)
Neutrophils Relative %: 84.4 %
Platelets: 257 Thousand/uL (ref 140–400)
RBC: 4.83 Million/uL (ref 3.80–5.10)
RDW: 13.3 % (ref 11.0–15.0)
Total Lymphocyte: 8.9 %
WBC: 10.5 Thousand/uL (ref 3.8–10.8)

## 2023-07-23 LAB — PROTEIN ELECTROPHORESIS, SERUM, WITH REFLEX
Abnormal Protein Band1: 0.7 g/dL — ABNORMAL HIGH
Albumin ELP: 4.1 g/dL (ref 3.8–4.8)
Alpha 1: 0.4 g/dL — ABNORMAL HIGH (ref 0.2–0.3)
Alpha 2: 0.9 g/dL (ref 0.5–0.9)
Beta 2: 0.8 g/dL — ABNORMAL HIGH (ref 0.2–0.5)
Beta Globulin: 0.4 g/dL (ref 0.4–0.6)
Gamma Globulin: 1.6 g/dL (ref 0.8–1.7)
Total Protein: 8.1 g/dL (ref 6.1–8.1)

## 2023-07-23 LAB — COMPREHENSIVE METABOLIC PANEL WITH GFR
AG Ratio: 1.2 (calc) (ref 1.0–2.5)
ALT: 12 U/L (ref 6–29)
AST: 23 U/L (ref 10–35)
Albumin: 4.2 g/dL (ref 3.6–5.1)
Alkaline phosphatase (APISO): 76 U/L (ref 37–153)
BUN: 14 mg/dL (ref 7–25)
CO2: 23 mmol/L (ref 20–32)
Calcium: 9.5 mg/dL (ref 8.6–10.4)
Chloride: 101 mmol/L (ref 98–110)
Creat: 0.7 mg/dL (ref 0.60–1.00)
Globulin: 3.6 g/dL (ref 1.9–3.7)
Glucose, Bld: 98 mg/dL (ref 65–99)
Potassium: 4.6 mmol/L (ref 3.5–5.3)
Sodium: 141 mmol/L (ref 135–146)
Total Bilirubin: 0.6 mg/dL (ref 0.2–1.2)
Total Protein: 7.8 g/dL (ref 6.1–8.1)
eGFR: 90 mL/min/1.73m2 (ref >=60)

## 2023-07-23 LAB — IGG, IGA, IGM
IgG (Immunoglobin G), Serum: 1890 mg/dL — ABNORMAL HIGH (ref 600–1540)
IgM, Serum: 796 mg/dL — ABNORMAL HIGH (ref 50–300)
Immunoglobulin A: 192 mg/dL (ref 70–320)

## 2023-07-23 LAB — THIOPURINE METHYLTRANSFERASE (TPMT), RBC: Thiopurine Methyltransferase, RBC: 16 nmol/h/mL

## 2023-07-23 LAB — IFE INTERPRETATION

## 2023-07-23 LAB — HEPATITIS B SURFACE ANTIGEN: Hepatitis B Surface Ag: NONREACTIVE

## 2023-07-23 LAB — QUANTIFERON-TB GOLD PLUS
Mitogen-NIL: 2.37 [IU]/mL
NIL: 0.02 [IU]/mL
QuantiFERON-TB Gold Plus: NEGATIVE
TB1-NIL: 0 [IU]/mL
TB2-NIL: 0.01 [IU]/mL

## 2023-07-23 LAB — HEPATITIS B CORE ANTIBODY, IGM: Hep B C IgM: NONREACTIVE

## 2023-07-23 LAB — HEMOGLOBIN A1C
Hgb A1c MFr Bld: 6 % — ABNORMAL HIGH (ref <5.7)
Mean Plasma Glucose: 126 mg/dL
eAG (mmol/L): 7 mmol/L

## 2023-07-23 LAB — HEPATITIS C ANTIBODY: Hepatitis C Ab: NONREACTIVE

## 2023-07-23 LAB — GLUCOSE 6 PHOSPHATE DEHYDROGENASE: G-6PDH: 21.6 U/g{Hb} — ABNORMAL HIGH (ref 7.0–20.5)

## 2023-07-23 NOTE — Progress Notes (Signed)
 IFE shows IgM kappa monoclonal band.  Please refer her to hematology for evaluation.

## 2023-07-28 ENCOUNTER — Telehealth: Payer: Self-pay | Admitting: Rheumatology

## 2023-07-28 NOTE — Telephone Encounter (Signed)
 I called patient, LMOM at Hematology/Oncology to schedule appt.

## 2023-07-28 NOTE — Telephone Encounter (Signed)
 Patient called stating Dr. Dolphus referred her to hematology and she has left multiple messages at [phone] and has not received a return call.  Patient states the Prednisone  that Dr. Dolphus prescribed has helped her pain symptoms.

## 2023-07-30 ENCOUNTER — Telehealth: Payer: Self-pay | Admitting: Rheumatology

## 2023-07-30 DIAGNOSIS — Z9622 Myringotomy tube(s) status: Secondary | ICD-10-CM | POA: Diagnosis not present

## 2023-07-30 NOTE — Telephone Encounter (Signed)
 I returned patient's call.  She is taking prednisone  10 mg p.o. daily.  She does not think that her heartburn is related to methotrexate .  I advised her to add Pepcid  to her regimen.  She states her joint swelling has completely resolved.  I advised her to take prednisone  5 mg tablet for the next 2 to 3 days and then discontinue.

## 2023-07-30 NOTE — Telephone Encounter (Signed)
 Patient let a voicemail stating that yesterday around 4pm after eating she experiencing severe heartburn and indigestion. She was worrying if  the prednisone  or folic acid  could the cause. Pleas advise.

## 2023-08-03 ENCOUNTER — Telehealth: Payer: Self-pay

## 2023-08-03 NOTE — Telephone Encounter (Signed)
 Patient called the office to see when she should come in for her labs, advised she should come in this week since she has taken 2 dose of the methotrexate . Patient wanted to know when she should titrate up to the 8 tabs daily, advised she should do the 8 tabs this Saturday and then again next Saturday and then will come back in for labs after the second dose of 8 tabs.

## 2023-08-06 ENCOUNTER — Other Ambulatory Visit: Payer: Self-pay | Admitting: *Deleted

## 2023-08-06 DIAGNOSIS — Z79899 Other long term (current) drug therapy: Secondary | ICD-10-CM

## 2023-08-06 LAB — CBC WITH DIFFERENTIAL/PLATELET
Absolute Lymphocytes: 1218 {cells}/uL (ref 850–3900)
Absolute Monocytes: 479 {cells}/uL (ref 200–950)
Basophils Absolute: 44 {cells}/uL (ref 0–200)
Basophils Relative: 0.5 %
Eosinophils Absolute: 278 {cells}/uL (ref 15–500)
Eosinophils Relative: 3.2 %
HCT: 43.8 % (ref 35.0–45.0)
Hemoglobin: 14.1 g/dL (ref 11.7–15.5)
MCH: 30.1 pg (ref 27.0–33.0)
MCHC: 32.2 g/dL (ref 32.0–36.0)
MCV: 93.4 fL (ref 80.0–100.0)
MPV: 12.5 fL (ref 7.5–12.5)
Monocytes Relative: 5.5 %
Neutro Abs: 6682 {cells}/uL (ref 1500–7800)
Neutrophils Relative %: 76.8 %
Platelets: 202 Thousand/uL (ref 140–400)
RBC: 4.69 Million/uL (ref 3.80–5.10)
RDW: 14.3 % (ref 11.0–15.0)
Total Lymphocyte: 14 %
WBC: 8.7 Thousand/uL (ref 3.8–10.8)

## 2023-08-06 LAB — COMPREHENSIVE METABOLIC PANEL WITH GFR
AG Ratio: 1.3 (calc) (ref 1.0–2.5)
ALT: 13 U/L (ref 6–29)
AST: 19 U/L (ref 10–35)
Albumin: 4 g/dL (ref 3.6–5.1)
Alkaline phosphatase (APISO): 58 U/L (ref 37–153)
BUN: 15 mg/dL (ref 7–25)
CO2: 28 mmol/L (ref 20–32)
Calcium: 9.4 mg/dL (ref 8.6–10.4)
Chloride: 105 mmol/L (ref 98–110)
Creat: 0.92 mg/dL (ref 0.60–1.00)
Globulin: 3.2 g/dL (ref 1.9–3.7)
Glucose, Bld: 86 mg/dL (ref 65–99)
Potassium: 4.4 mmol/L (ref 3.5–5.3)
Sodium: 142 mmol/L (ref 135–146)
Total Bilirubin: 0.5 mg/dL (ref 0.2–1.2)
Total Protein: 7.2 g/dL (ref 6.1–8.1)
eGFR: 65 mL/min/1.73m2 (ref >=60)

## 2023-08-10 ENCOUNTER — Ambulatory Visit: Payer: Self-pay | Admitting: Physician Assistant

## 2023-08-11 ENCOUNTER — Other Ambulatory Visit: Payer: Self-pay | Admitting: Rheumatology

## 2023-08-13 NOTE — Progress Notes (Signed)
 Office Visit Note  Patient: Christy Moore             Date of Birth: 06/16/1945           MRN: [id number]             PCP: Micheal Wolm LELON, MD Referring: Micheal Wolm LELON, MD Visit Date: 08/25/2023 Occupation: @GUAROCC @  Subjective:  Medication management  History of Present Illness: Christy Moore is a 78 y.o. female with Sjogren's, ILD and inflammatory arthritis.  She returns today after her last visit in July 2025.  She has been on methotrexate  since her last visit.  She was started on methotrexate  6 tablets p.o. weekly and increase it to 8 tablets p.o. weekly.  As the creatinine went up she reduced the dose of methotrexate  to 6 tablets p.o. weekly.  She is also taking folic acid  2 mg p.o. daily on a regular basis.  She states increased activity causes increased discomfort otherwise she has been doing better.  She has been gardening and cleaning carpets which has been strenuous on her joints.  Her symptoms improved after prednisone  taper.  She states she has been off prednisone  for 3 to 4 weeks now.  She continues to have dry mouth and dry eyes.  Raynauds symptoms are manageable.  He denies any shortness of breath.  She is having stiffness in her hands.    Activities of Daily Living:  Patient reports morning stiffness for 0 minute.   Patient Denies nocturnal pain.  Difficulty dressing/grooming: Denies Difficulty climbing stairs: Denies Difficulty getting out of chair: Denies Difficulty using hands for taps, buttons, cutlery, and/or writing: Denies  Review of Systems  Constitutional:  Negative for fatigue.  HENT:  Positive for mouth dryness. Negative for mouth sores.   Eyes:  Positive for dryness.  Respiratory:  Negative for shortness of breath.   Cardiovascular:  Negative for chest pain and palpitations.  Gastrointestinal:  Negative for blood in stool, constipation and diarrhea.  Endocrine: Negative for increased urination.  Genitourinary:  Negative for involuntary  urination.  Musculoskeletal:  Positive for joint pain, joint pain, joint swelling, myalgias and myalgias. Negative for gait problem, muscle weakness, morning stiffness and muscle tenderness.  Skin:  Negative for color change, rash, hair loss and sensitivity to sunlight.  Allergic/Immunologic: Negative for susceptible to infections.  Neurological:  Positive for headaches. Negative for dizziness.  Hematological:  Negative for swollen glands.  Psychiatric/Behavioral:  Positive for depressed mood. Negative for sleep disturbance. The patient is not nervous/anxious.     PMFS History:  Patient Active Problem List   Diagnosis Date Noted   Depression 07/13/2020   Thrush 11/03/2012   Sjogren's disease (HCC) 01/22/2012   GERD (gastroesophageal reflux disease) 03/19/2011   Hypertension 03/19/2011   Raynaud's disease 03/19/2011   Cardiomegaly 02/26/2010   Hypothyroidism 02/26/2010   Wrist fracture, left 02/26/2010   Liver hemangioma 02/26/2010   Migraine 02/26/2010   Esophageal stricture 02/26/2010   Weight loss 02/26/2010   OTHER CHRONIC SINUSITIS 12/20/2007   Rosacea 12/20/2007    Past Medical History:  Diagnosis Date   Arthritis    Cardiomegaly    Chronic pneumonia    Depression    GERD (gastroesophageal reflux disease)    Hypertension    Hypothyroidism    Raynaud disease    Seizures (HCC)    Sinus tachycardia    Sjogren's syndrome (HCC)    Status post dilation of esophageal narrowing 2012   Dr. Luis  Family History  Problem Relation Age of Onset   Cancer Mother        breast and lung   Stroke Mother 21   Hypothyroidism Mother    Cancer Father        prostate   Heart attack Father 68   Alcohol abuse Maternal Grandmother    Alcohol abuse Maternal Grandfather    Allergies Other        Grandmother-pt states she takes after grandmother who had multiple allergies   Colon cancer Neg Hx    Esophageal cancer Neg Hx    Stomach cancer Neg Hx    Rectal cancer Neg Hx     Past Surgical History:  Procedure Laterality Date   BASAL CELL CARCINOMA EXCISION  2024   CATARACT EXTRACTION, BILATERAL  2024   HAND TENDON SURGERY  02/09/2007   tendon transfer-Dr. Camella   NASAL SEPTUM SURGERY     operated on by Dr. Floy 20 years ago   OVARIAN CYST REMOVAL     Right Sided Salpingo-oopherectomy   SALPINGOOPHORECTOMY     WISDOM TOOTH EXTRACTION     Social History   Social History Narrative   Not on file   Immunization History  Administered Date(s) Administered   Fluad Quad(high Dose 65+) 09/16/2018, 10/03/2019, 09/27/2021   Influenza Split 11/22/2011   Influenza, High Dose Seasonal PF 09/14/2014, 09/07/2015, 10/07/2016, 10/26/2017, 09/16/2018, 09/21/2020   Influenza,inj,Quad PF,6+ Mos 10/05/2013   Influenza,inj,quad, With Preservative 10/06/2016, 11/02/2017   Influenza-Unspecified 11/22/2011, 10/05/2013, 09/14/2014, 09/07/2015, 10/06/2016, 10/07/2016, 10/26/2017, 11/02/2017   PFIZER(Purple Top)SARS-COV-2 Vaccination 02/11/2019, 03/08/2019, 10/04/2019, 04/20/2020   Pfizer Covid-19 Vaccine Bivalent Booster 74yrs & up 10/08/2020   Pneumococcal Conjugate-13 10/05/2013   Pneumococcal Polysaccharide-23 02/19/2015   Zoster Recombinant(Shingrix) 10/08/2020   Zoster, Live 01/17/2010     Objective: Vital Signs: BP 107/67 (BP Location: Left Arm, Patient Position: Sitting, Cuff Size: Normal)   Pulse 81   Resp 14   Ht 5' 4.5 (1.638 m)   Wt 150 lb (68 kg)   BMI 25.35 kg/m    Physical Exam Vitals and nursing note reviewed.  Constitutional:      Appearance: She is well-developed.  HENT:     Head: Normocephalic and atraumatic.  Eyes:     Conjunctiva/sclera: Conjunctivae normal.  Cardiovascular:     Rate and Rhythm: Normal rate and regular rhythm.     Heart sounds: Normal heart sounds.  Pulmonary:     Effort: Pulmonary effort is normal.     Breath sounds: Normal breath sounds.     Comments: Crackles in the lung bases Abdominal:     General: Bowel  sounds are normal.     Palpations: Abdomen is soft.  Musculoskeletal:     Cervical back: Normal range of motion.  Lymphadenopathy:     Cervical: No cervical adenopathy.  Skin:    General: Skin is warm and dry.     Capillary Refill: Capillary refill takes less than 2 seconds.  Neurological:     Mental Status: She is alert and oriented to person, place, and time.  Psychiatric:        Behavior: Behavior normal.      Musculoskeletal Exam: She has some limitation with range of motion of the cervical spine.  She had no tenderness over thoracic or lumbar spine.  Shoulders, elbows, wrist joints, MCPs PIPs and DIPs in good range of motion with no synovitis.  She had bilateral PIP and DIP thickening.  Hip joints and knee joints in good  range of motion without any warmth swelling or effusion.  There was no tenderness or swelling over MTPs.  CDAI Exam: CDAI Score: -- Patient Global: 10 / 100; Provider Global: 10 / 100 Swollen: --; Tender: -- Joint Exam 08/25/2023   No joint exam has been documented for this visit   There is currently no information documented on the homunculus. Go to the Rheumatology activity and complete the homunculus joint exam.  Investigation: No additional findings.  Imaging: No results found.   Recent Labs: Lab Results  Component Value Date   WBC 6.2 08/20/2023   HGB 13.7 08/20/2023   PLT 258 08/20/2023   NA 140 08/20/2023   K 4.4 08/20/2023   CL 103 08/20/2023   CO2 28 08/20/2023   GLUCOSE 115 (H) 08/20/2023   BUN 16 08/20/2023   CREATININE 1.02 (H) 08/20/2023   BILITOT 0.5 08/20/2023   ALKPHOS 53 05/03/2022   AST 22 08/20/2023   ALT 12 08/20/2023   PROT 7.3 08/20/2023   ALBUMIN 4.3 05/03/2022   CALCIUM 9.4 08/20/2023   GFRAA >60 12/11/2018   QFTBGOLDPLUS NEGATIVE 07/16/2023   July 16, 2023 TPMT 16, G6PD 21.6, hepatitis B and hepatitis C nonreactive, IgG and IgM elevated, hemoglobin A1c 6.0, TB Gold negative, IFE IgM kappa monoclonal band  present  Speciality Comments: No specialty comments available.  Procedures:  No procedures performed Allergies: Albuterol -budesonide, Amoxicillin -pot clavulanate, Azithromycin , Broccoli [brassica oleracea], Celecoxib, Ciprofloxacin, Erythromycin, Hydrocodone, Hydrocodone-acetaminophen , Levofloxacin, Omeprazole , Penicillins, Propoxyphene n-acetaminophen , Rofecoxib, Sulfa antibiotics, and Tetanus toxoid   Assessment / Plan:     Visit Diagnoses: Rheumatoid arthritis of multiple sites with negative rheumatoid factor (HCC) - RF negative, anti-CCP positive, elevated sed rate severe inflammatory arthritis.  Patient states her symptoms responded quickly to prednisone  taper.  She has been on methotrexate  since her last visit.  She states she started on methotrexate  6 tablets and increase it to 8 tablets.  Due to elevated creatinine we reduced her dose of methotrexate  to 6 tablets p.o. weekly about a week ago.  She denies any joint swelling.  She states she has been very active and has been doing gardening including carpet.  She notices some stiffness in her hands.  No synovitis was noted on the examination today.  I discussed the option of adding hydroxychloroquine if she continues to have joint pain and joint swelling.  Sjogren's syndrome with other organ involvement (HCC) - Positive ANA, positive SSA, sicca symptoms, mild ILD.  She continues to have dry mouth and dry eyes.  Over-the-counter products were discussed.  High risk medication use -methotrexate  6 tablets p.o. weekly along with folic acid  2 mg p.o. daily.  Labs from August 20, 2023 CBC normal, CMP showed creatinine 1.02.  Patient was advised to have repeat labs in a month and then every 3 months to monitor for drug toxicity.  Information about immunization was placed in the AVS.  She was advised to hold methotrexate  if she develops an infection and resume after the infection resolves.  Raynaud's disease without gangrene-currently not  symptomatic.  ILD (interstitial lung disease) (HCC) - Mild ILD noted on the high-resolution CT.  Patient is followed by Dr. Kara.  Dr. Kara was in agreement to start methotrexate  for arthritis.  She had crackles in bilateral lung bases.  Bronchiectasis without complication (HCC) - Recurrent pneumonia 3-4 episodes in the last 2 years.  Pain in both hands - Review inflammatory arthritis was noted.  X-rays were suggestive of osteoarthritis only.  Closed fracture of left wrist,  sequela - 20 years.  She gets some discomfort with increased activity.  Pain in both feet -no synovitis was noted on the examination today.  X-rays were suggestive of osteoarthritis.  DDD (degenerative disc disease), cervical-she is some limitation with range of motion.  Abnormal SPEP-IFE showed IgM kappa monoclonal band.  Patient has an appointment coming up with Dr. Sherrod.  Primary hypertension-blood pressure was normal today.  Other medical problems are listed as follows:  Gastroesophageal reflux disease with esophagitis without hemorrhage  Esophageal stricture  Liver hemangioma  Cardiomegaly  Other specified hypothyroidism  Rosacea  History of migraine  Other chronic sinusitis  Anxiety and depression  Orders: No orders of the defined types were placed in this encounter.  No orders of the defined types were placed in this encounter.    Follow-Up Instructions: Return in about 3 months (around 11/25/2023) for Rheumatoid arthritis.   Maya Nash, MD  Note - This record has been created using Animal nutritionist.  Chart creation errors have been sought, but may not always  have been located. Such creation errors do not reflect on  the standard of medical care.

## 2023-08-17 ENCOUNTER — Telehealth: Payer: Self-pay | Admitting: *Deleted

## 2023-08-17 NOTE — Telephone Encounter (Signed)
 Patient contacted the office regarding her prescription refill for MTX. Patient states she has taken 2 doses of 8 tabs weekly.   Returned call to patient to advised she will need to come for another set of labs and once completed we will be able to send her prescription to the pharmacy. Patient advised to come prior to Friday so we can get her prescription sent to the pharmacy prior to her next dose being due. Patient expressed understanding.

## 2023-08-20 ENCOUNTER — Other Ambulatory Visit: Payer: Self-pay | Admitting: *Deleted

## 2023-08-20 DIAGNOSIS — H903 Sensorineural hearing loss, bilateral: Secondary | ICD-10-CM | POA: Diagnosis not present

## 2023-08-20 DIAGNOSIS — Z79899 Other long term (current) drug therapy: Secondary | ICD-10-CM

## 2023-08-21 ENCOUNTER — Other Ambulatory Visit: Payer: Self-pay

## 2023-08-21 ENCOUNTER — Ambulatory Visit: Payer: Self-pay | Admitting: Rheumatology

## 2023-08-21 ENCOUNTER — Other Ambulatory Visit: Payer: Self-pay | Admitting: Rheumatology

## 2023-08-21 DIAGNOSIS — Z79899 Other long term (current) drug therapy: Secondary | ICD-10-CM

## 2023-08-21 LAB — CBC WITH DIFFERENTIAL/PLATELET
Absolute Lymphocytes: 763 {cells}/uL — ABNORMAL LOW (ref 850–3900)
Absolute Monocytes: 372 {cells}/uL (ref 200–950)
Basophils Absolute: 31 {cells}/uL (ref 0–200)
Basophils Relative: 0.5 %
Eosinophils Absolute: 211 {cells}/uL (ref 15–500)
Eosinophils Relative: 3.4 %
HCT: 42.6 % (ref 35.0–45.0)
Hemoglobin: 13.7 g/dL (ref 11.7–15.5)
MCH: 30.2 pg (ref 27.0–33.0)
MCHC: 32.2 g/dL (ref 32.0–36.0)
MCV: 94 fL (ref 80.0–100.0)
MPV: 12.3 fL (ref 7.5–12.5)
Monocytes Relative: 6 %
Neutro Abs: 4824 {cells}/uL (ref 1500–7800)
Neutrophils Relative %: 77.8 %
Platelets: 258 Thousand/uL (ref 140–400)
RBC: 4.53 Million/uL (ref 3.80–5.10)
RDW: 14.4 % (ref 11.0–15.0)
Total Lymphocyte: 12.3 %
WBC: 6.2 Thousand/uL (ref 3.8–10.8)

## 2023-08-21 LAB — COMPREHENSIVE METABOLIC PANEL WITH GFR
AG Ratio: 1.2 (calc) (ref 1.0–2.5)
ALT: 12 U/L (ref 6–29)
AST: 22 U/L (ref 10–35)
Albumin: 4 g/dL (ref 3.6–5.1)
Alkaline phosphatase (APISO): 63 U/L (ref 37–153)
BUN/Creatinine Ratio: 16 (calc) (ref 6–22)
BUN: 16 mg/dL (ref 7–25)
CO2: 28 mmol/L (ref 20–32)
Calcium: 9.4 mg/dL (ref 8.6–10.4)
Chloride: 103 mmol/L (ref 98–110)
Creat: 1.02 mg/dL — ABNORMAL HIGH (ref 0.60–1.00)
Globulin: 3.3 g/dL (ref 1.9–3.7)
Glucose, Bld: 115 mg/dL — ABNORMAL HIGH (ref 65–99)
Potassium: 4.4 mmol/L (ref 3.5–5.3)
Sodium: 140 mmol/L (ref 135–146)
Total Bilirubin: 0.5 mg/dL (ref 0.2–1.2)
Total Protein: 7.3 g/dL (ref 6.1–8.1)
eGFR: 57 mL/min/1.73m2 — ABNORMAL LOW (ref >=60)

## 2023-08-21 MED ORDER — METHOTREXATE SODIUM 2.5 MG PO TABS: ORAL_TABLET | ORAL | 0 refills | Status: DC

## 2023-08-21 NOTE — Progress Notes (Signed)
 I called patient and advised that creatinine is elevated.  I advised her to reduce the dose of methotrexate  to 6 tablets p.o. weekly.  She was also advised to come back in a month for repeat labs.  I sent a prescription methotrexate  6 tablets p.o. weekly total 26 tablets with no refills.

## 2023-08-21 NOTE — Progress Notes (Signed)
 Future lab orders pended for one month

## 2023-08-24 ENCOUNTER — Telehealth: Payer: Self-pay | Admitting: Rheumatology

## 2023-08-24 NOTE — Telephone Encounter (Signed)
 Pt LVM and would like to know if she still needs to come to her appt tomorrow 08/19. Pt stated Dr. Dolphus called her stating she would see her in a month Friday afternoon and pt is confused.

## 2023-08-25 ENCOUNTER — Ambulatory Visit: Attending: Rheumatology | Admitting: Rheumatology

## 2023-08-25 ENCOUNTER — Encounter: Payer: Self-pay | Admitting: Rheumatology

## 2023-08-25 VITALS — BP 107/67 | HR 81 | Resp 14 | Ht 64.5 in | Wt 150.0 lb

## 2023-08-25 DIAGNOSIS — M79642 Pain in left hand: Secondary | ICD-10-CM | POA: Diagnosis not present

## 2023-08-25 DIAGNOSIS — D1803 Hemangioma of intra-abdominal structures: Secondary | ICD-10-CM | POA: Diagnosis not present

## 2023-08-25 DIAGNOSIS — Z8669 Personal history of other diseases of the nervous system and sense organs: Secondary | ICD-10-CM | POA: Diagnosis not present

## 2023-08-25 DIAGNOSIS — M5030 Other cervical disc degeneration, unspecified cervical region: Secondary | ICD-10-CM | POA: Diagnosis not present

## 2023-08-25 DIAGNOSIS — M3509 Sicca syndrome with other organ involvement: Secondary | ICD-10-CM | POA: Diagnosis not present

## 2023-08-25 DIAGNOSIS — J849 Interstitial pulmonary disease, unspecified: Secondary | ICD-10-CM | POA: Insufficient documentation

## 2023-08-25 DIAGNOSIS — E038 Other specified hypothyroidism: Secondary | ICD-10-CM | POA: Insufficient documentation

## 2023-08-25 DIAGNOSIS — K2100 Gastro-esophageal reflux disease with esophagitis, without bleeding: Secondary | ICD-10-CM | POA: Diagnosis not present

## 2023-08-25 DIAGNOSIS — S62102S Fracture of unspecified carpal bone, left wrist, sequela: Secondary | ICD-10-CM | POA: Insufficient documentation

## 2023-08-25 DIAGNOSIS — F32A Depression, unspecified: Secondary | ICD-10-CM | POA: Insufficient documentation

## 2023-08-25 DIAGNOSIS — F419 Anxiety disorder, unspecified: Secondary | ICD-10-CM | POA: Diagnosis not present

## 2023-08-25 DIAGNOSIS — R778 Other specified abnormalities of plasma proteins: Secondary | ICD-10-CM | POA: Insufficient documentation

## 2023-08-25 DIAGNOSIS — M79672 Pain in left foot: Secondary | ICD-10-CM | POA: Diagnosis not present

## 2023-08-25 DIAGNOSIS — I7300 Raynaud's syndrome without gangrene: Secondary | ICD-10-CM | POA: Diagnosis not present

## 2023-08-25 DIAGNOSIS — J328 Other chronic sinusitis: Secondary | ICD-10-CM | POA: Insufficient documentation

## 2023-08-25 DIAGNOSIS — M0609 Rheumatoid arthritis without rheumatoid factor, multiple sites: Secondary | ICD-10-CM | POA: Diagnosis not present

## 2023-08-25 DIAGNOSIS — M79641 Pain in right hand: Secondary | ICD-10-CM | POA: Diagnosis not present

## 2023-08-25 DIAGNOSIS — L719 Rosacea, unspecified: Secondary | ICD-10-CM | POA: Insufficient documentation

## 2023-08-25 DIAGNOSIS — M79671 Pain in right foot: Secondary | ICD-10-CM | POA: Diagnosis not present

## 2023-08-25 DIAGNOSIS — I10 Essential (primary) hypertension: Secondary | ICD-10-CM | POA: Insufficient documentation

## 2023-08-25 DIAGNOSIS — J479 Bronchiectasis, uncomplicated: Secondary | ICD-10-CM | POA: Diagnosis not present

## 2023-08-25 DIAGNOSIS — Z79899 Other long term (current) drug therapy: Secondary | ICD-10-CM | POA: Insufficient documentation

## 2023-08-25 DIAGNOSIS — I517 Cardiomegaly: Secondary | ICD-10-CM | POA: Diagnosis not present

## 2023-08-25 DIAGNOSIS — Z7952 Long term (current) use of systemic steroids: Secondary | ICD-10-CM

## 2023-08-25 DIAGNOSIS — K222 Esophageal obstruction: Secondary | ICD-10-CM | POA: Diagnosis not present

## 2023-08-25 NOTE — Patient Instructions (Signed)
 Standing Labs We placed an order today for your standing lab work.   Please have your standing labs drawn in 67month and then every 3 months  Please have your labs drawn 2 weeks prior to your appointment so that the provider can discuss your lab results at your appointment, if possible.  Please note that you may see your imaging and lab results in MyChart before we have reviewed them. We will contact you once all results are reviewed. Please allow our office up to 72 hours to thoroughly review all of the results before contacting the office for clarification of your results.  WALK-IN LAB HOURS  Monday through Thursday from 8:00 am -12:30 pm and 1:00 pm-4:30 pm and Friday from 8:00 am-12:00 pm.  Patients with office visits requiring labs will be seen before walk-in labs.  You may encounter longer than normal wait times. Please allow additional time. Wait times may be shorter on  Monday and Thursday afternoons.  We do not book appointments for walk-in labs. We appreciate your patience and understanding with our staff.   Labs are drawn by [facility]. Please bring your co-pay at the time of your lab draw.  You may receive a bill from [facility] for your lab work.  Please note if you are on Hydroxychloroquine and and an order has been placed for a Hydroxychloroquine level,  you will need to have it drawn 4 hours or more after your last dose.  If you wish to have your labs drawn at another location, please call the office 24 hours in advance so we can fax the orders.  The office is located at [address], [place], [place] [address]   If you have any questions regarding directions or hours of operation,  please call [phone].   As a reminder, please drink plenty of water prior to coming for your lab work. Thanks!   Vaccines You are taking a medication(s) that can suppress your immune system.  The following immunizations are recommended: Flu annually Covid-19  Td/Tdap (tetanus,  diphtheria, pertussis) every 10 years Pneumonia (Prevnar 15 then Pneumovax 23 at least 1 year apart.  Alternatively, can take Prevnar 20 without needing additional dose) Shingrix: 2 doses from 4 weeks to 6 months apart  Please check with your PCP to make sure you are up to date.   If you have signs or symptoms of an infection or start antibiotics: First, call your PCP for workup of your infection. Hold your medication through the infection, until you complete your antibiotics, and until symptoms resolve if you take the following: Injectable medication (Actemra, Benlysta, Cimzia, Cosentyx, Enbrel, Humira, Kevzara, Orencia, Remicade, Simponi, Stelara, Taltz, Tremfya) Methotrexate  Leflunomide (Arava) Mycophenolate (Cellcept) Earma, Olumiant, or Rinvoq

## 2023-08-26 ENCOUNTER — Other Ambulatory Visit: Payer: Self-pay | Admitting: Internal Medicine

## 2023-08-26 DIAGNOSIS — D472 Monoclonal gammopathy: Secondary | ICD-10-CM

## 2023-08-27 ENCOUNTER — Inpatient Hospital Stay

## 2023-08-27 ENCOUNTER — Inpatient Hospital Stay: Attending: Internal Medicine | Admitting: Internal Medicine

## 2023-08-27 VITALS — BP 138/75 | HR 92 | Temp 98.0°F | Resp 17 | Ht 64.5 in | Wt 150.0 lb

## 2023-08-27 DIAGNOSIS — M3500 Sicca syndrome, unspecified: Secondary | ICD-10-CM | POA: Diagnosis not present

## 2023-08-27 DIAGNOSIS — Z803 Family history of malignant neoplasm of breast: Secondary | ICD-10-CM | POA: Diagnosis not present

## 2023-08-27 DIAGNOSIS — Z8042 Family history of malignant neoplasm of prostate: Secondary | ICD-10-CM | POA: Diagnosis not present

## 2023-08-27 DIAGNOSIS — Z880 Allergy status to penicillin: Secondary | ICD-10-CM | POA: Diagnosis not present

## 2023-08-27 DIAGNOSIS — M1990 Unspecified osteoarthritis, unspecified site: Secondary | ICD-10-CM | POA: Diagnosis not present

## 2023-08-27 DIAGNOSIS — Z79899 Other long term (current) drug therapy: Secondary | ICD-10-CM | POA: Insufficient documentation

## 2023-08-27 DIAGNOSIS — Z801 Family history of malignant neoplasm of trachea, bronchus and lung: Secondary | ICD-10-CM | POA: Diagnosis not present

## 2023-08-27 DIAGNOSIS — Z8349 Family history of other endocrine, nutritional and metabolic diseases: Secondary | ICD-10-CM | POA: Diagnosis not present

## 2023-08-27 DIAGNOSIS — Z882 Allergy status to sulfonamides status: Secondary | ICD-10-CM | POA: Insufficient documentation

## 2023-08-27 DIAGNOSIS — Z885 Allergy status to narcotic agent status: Secondary | ICD-10-CM | POA: Insufficient documentation

## 2023-08-27 DIAGNOSIS — Z85828 Personal history of other malignant neoplasm of skin: Secondary | ICD-10-CM | POA: Insufficient documentation

## 2023-08-27 DIAGNOSIS — I517 Cardiomegaly: Secondary | ICD-10-CM

## 2023-08-27 DIAGNOSIS — Z823 Family history of stroke: Secondary | ICD-10-CM | POA: Diagnosis not present

## 2023-08-27 DIAGNOSIS — I7300 Raynaud's syndrome without gangrene: Secondary | ICD-10-CM | POA: Diagnosis not present

## 2023-08-27 DIAGNOSIS — Z90721 Acquired absence of ovaries, unilateral: Secondary | ICD-10-CM | POA: Diagnosis not present

## 2023-08-27 DIAGNOSIS — R5383 Other fatigue: Secondary | ICD-10-CM

## 2023-08-27 DIAGNOSIS — Z811 Family history of alcohol abuse and dependence: Secondary | ICD-10-CM | POA: Diagnosis not present

## 2023-08-27 DIAGNOSIS — F32A Depression, unspecified: Secondary | ICD-10-CM

## 2023-08-27 DIAGNOSIS — Z887 Allergy status to serum and vaccine status: Secondary | ICD-10-CM | POA: Diagnosis not present

## 2023-08-27 DIAGNOSIS — I10 Essential (primary) hypertension: Secondary | ICD-10-CM | POA: Insufficient documentation

## 2023-08-27 DIAGNOSIS — D472 Monoclonal gammopathy: Secondary | ICD-10-CM | POA: Insufficient documentation

## 2023-08-27 DIAGNOSIS — Z881 Allergy status to other antibiotic agents status: Secondary | ICD-10-CM | POA: Insufficient documentation

## 2023-08-27 DIAGNOSIS — K219 Gastro-esophageal reflux disease without esophagitis: Secondary | ICD-10-CM | POA: Diagnosis not present

## 2023-08-27 DIAGNOSIS — M069 Rheumatoid arthritis, unspecified: Secondary | ICD-10-CM | POA: Diagnosis not present

## 2023-08-27 DIAGNOSIS — Z79631 Long term (current) use of antimetabolite agent: Secondary | ICD-10-CM | POA: Insufficient documentation

## 2023-08-27 DIAGNOSIS — E039 Hypothyroidism, unspecified: Secondary | ICD-10-CM | POA: Diagnosis not present

## 2023-08-27 DIAGNOSIS — Z8249 Family history of ischemic heart disease and other diseases of the circulatory system: Secondary | ICD-10-CM | POA: Diagnosis not present

## 2023-08-27 LAB — CBC WITH DIFFERENTIAL (CANCER CENTER ONLY)
Abs Immature Granulocytes: 0.02 K/uL (ref 0.00–0.07)
Basophils Absolute: 0 K/uL (ref 0.0–0.1)
Basophils Relative: 1 %
Eosinophils Absolute: 0.1 K/uL (ref 0.0–0.5)
Eosinophils Relative: 2 %
HCT: 40.6 % (ref 36.0–46.0)
Hemoglobin: 13.6 g/dL (ref 12.0–15.0)
Immature Granulocytes: 0 %
Lymphocytes Relative: 12 %
Lymphs Abs: 0.8 K/uL (ref 0.7–4.0)
MCH: 30.6 pg (ref 26.0–34.0)
MCHC: 33.5 g/dL (ref 30.0–36.0)
MCV: 91.2 fL (ref 80.0–100.0)
Monocytes Absolute: 0.4 K/uL (ref 0.1–1.0)
Monocytes Relative: 6 %
Neutro Abs: 5.8 K/uL (ref 1.7–7.7)
Neutrophils Relative %: 79 %
Platelet Count: 255 K/uL (ref 150–400)
RBC: 4.45 MIL/uL (ref 3.87–5.11)
RDW: 15.1 % (ref 11.5–15.5)
WBC Count: 7.3 K/uL (ref 4.0–10.5)
nRBC: 0 % (ref 0.0–0.2)

## 2023-08-27 LAB — CMP (CANCER CENTER ONLY)
ALT: 10 U/L (ref 0–44)
AST: 21 U/L (ref 15–41)
Albumin: 4.1 g/dL (ref 3.5–5.0)
Alkaline Phosphatase: 67 U/L (ref 38–126)
Anion gap: 3 — ABNORMAL LOW (ref 5–15)
BUN: 14 mg/dL (ref 8–23)
CO2: 32 mmol/L (ref 22–32)
Calcium: 9.3 mg/dL (ref 8.9–10.3)
Chloride: 106 mmol/L (ref 98–111)
Creatinine: 0.8 mg/dL (ref 0.44–1.00)
GFR, Estimated: >60 mL/min (ref >60)
Glucose, Bld: 100 mg/dL — ABNORMAL HIGH (ref 70–99)
Potassium: 4 mmol/L (ref 3.5–5.1)
Sodium: 141 mmol/L (ref 135–145)
Total Bilirubin: 0.4 mg/dL (ref 0.0–1.2)
Total Protein: 7.5 g/dL (ref 6.5–8.1)

## 2023-08-27 LAB — LACTATE DEHYDROGENASE: LDH: 154 U/L (ref 98–192)

## 2023-08-27 NOTE — Progress Notes (Signed)
 [facility] [phone]   [phone]) [phone]  CONSULT NOTE  REFERRING PHYSICIAN: Dr. Maya Nash  REASON FOR CONSULTATION:  78 years old white female with monoclonal gammopathy  HPI Christy Moore is a 78 y.o. female with past medical history significant for osteoarthritis, cardiomegaly, depression, GERD, hypertension, hypothyroidism, seizure, Sjogren syndrome as well as Raynaud's disease and esophageal stricture.  The patient was followed by Dr. Nash for history of Sjogren syndrome and rheumatoid arthritis. HPI  Discussed the use of AI scribe software for clinical note transcription with the patient, who gave verbal consent to proceed.  History of Present Illness Christy Moore is a 78 year old female with Sjogren's syndrome and Raynaud's who presents for evaluation of abnormal blood work and rheumatologic symptoms. She is accompanied by her husband. She was referred by Dr. Maryalice for evaluation of abnormal blood work related to her rheumatologic conditions.  She has a history of Sjogren's syndrome and Raynaud's, which have recently been affecting her lungs, leading to recurrent pneumonia every two months. She was referred to a pulmonologist, who identified the impact of her Sjogren's on her lungs. She was previously under the care of a rheumatologist but was dissatisfied with the management of her Sjogren's.  She was started on prednisone  for severe swelling in her hands, described as 'sausage fingers,' and difficulty walking. The prednisone  was tapered from 15 mg daily to 10 mg and then to 5 mg, with significant improvement in symptoms within eight hours of starting prednisone .  She was also started on methotrexate , initially at six tablets once a week, increased to eight tablets, and then reduced back to six tablets due to elevated liver enzymes. She is also using Flonase .  She has a history of osteoarthritis and reports that her arthritis  symptoms worsened after completing a course of doxycycline  for pneumonia. She does not test positive for rheumatoid arthritis but has been diagnosed with osteoarthritis for years.  She reports a weight loss from 170 lbs to 145 lbs over the past ten years, which she attributes to inflammation and lack of appetite. She has since gained back to 150 lbs, which she attributes to steroid use. No weight loss, night sweats, chest pain, or coughing blood.     Past Medical History:  Diagnosis Date   Arthritis    Cardiomegaly    Chronic pneumonia    Depression    GERD (gastroesophageal reflux disease)    Hypertension    Hypothyroidism    Raynaud disease    Seizures (HCC)    Sinus tachycardia    Sjogren's syndrome (HCC)    Status post dilation of esophageal narrowing 2012   Dr. Luis      Past Surgical History:  Procedure Laterality Date   BASAL CELL CARCINOMA EXCISION  2024   CATARACT EXTRACTION, BILATERAL  2024   HAND TENDON SURGERY  02/09/2007   tendon transfer-Dr. Camella   NASAL SEPTUM SURGERY     operated on by Dr. Floy 20 years ago   OVARIAN CYST REMOVAL     Right Sided Salpingo-oopherectomy   SALPINGOOPHORECTOMY     WISDOM TOOTH EXTRACTION      Family History  Problem Relation Age of Onset   Cancer Mother        breast and lung   Stroke Mother 4   Hypothyroidism Mother    Cancer Father        prostate   Heart attack Father 57   Alcohol abuse Maternal  Grandmother    Alcohol abuse Maternal Grandfather    Allergies Other        Grandmother-pt states she takes after grandmother who had multiple allergies   Colon cancer Neg Hx    Esophageal cancer Neg Hx    Stomach cancer Neg Hx    Rectal cancer Neg Hx     Social History Social History   Tobacco Use   Smoking status: Never    Passive exposure: Past   Smokeless tobacco: Never  Vaping Use   Vaping status: Never Used  Substance Use Topics   Alcohol use: Not Currently    Comment: rarely   Drug use: No     Allergies  Allergen Reactions   Albuterol -Budesonide Other (See Comments)    Burned her tongue  albuterol  / budesonide   Amoxicillin -Pot Clavulanate     REACTION: nausea   Azithromycin      REACTION: nausea, Gi  Other Reaction(s): Unknown   Broccoli [Brassica Oleracea]    Celecoxib Other (See Comments)    REACTION: nausea  celecoxib   Ciprofloxacin Other (See Comments)    REACTION: nausea  ciprofloxacin   Erythromycin Other (See Comments)    REACTION: nause, GI   Hydrocodone Other (See Comments)    REACTION: nausea   Hydrocodone-Acetaminophen      REACTION: GI upset   Levofloxacin Other (See Comments)    REACTION: she claims renal failiure  levofloxacin   Omeprazole  Nausea And Vomiting and Other (See Comments)    omeprazole    Penicillins    Propoxyphene N-Acetaminophen     Rofecoxib     REACTION: nausea   Sulfa Antibiotics    Tetanus Toxoid     Current Outpatient Medications  Medication Sig Dispense Refill   albuterol  (VENTOLIN  HFA) 108 (90 Base) MCG/ACT inhaler Inhale 1-2 puffs into the lungs every 4 (four) hours as needed for wheezing or shortness of breath.     amLODipine  (NORVASC ) 5 MG tablet Take 1 tablet (5 mg total) by mouth daily. 90 tablet 3   famotidine  (PEPCID ) 40 MG tablet TAKE 1 TABLET BY MOUTH EVERYDAY AT BEDTIME 90 tablet 0   fluticasone  (FLONASE ) 50 MCG/ACT nasal spray Place 2 sprays into both nostrils daily as needed for allergies.     folic acid  (FOLVITE ) 1 MG tablet Take 2 tablets (2 mg total) by mouth daily. 180 tablet 3   levothyroxine  (SYNTHROID ) 100 MCG tablet TAKE 1 TABLET BY MOUTH EVERY DAY 90 tablet 3   methotrexate  (RHEUMATREX) 2.5 MG tablet 6 tablets by mouth once a week 24 tablet 0   No current facility-administered medications for this visit.    Review of Systems  Constitutional: positive for fatigue Eyes: negative Ears, nose, mouth, throat, and face: negative Respiratory: negative Cardiovascular: negative Gastrointestinal:  negative Genitourinary:negative Integument/breast: negative Hematologic/lymphatic: negative Musculoskeletal:positive for arthralgias Neurological: negative Behavioral/Psych: negative Endocrine: negative Allergic/Immunologic: negative  Physical Exam  MJO:jozmu, healthy, no distress, well nourished, and well developed SKIN: skin color, texture, turgor are normal, no rashes or significant lesions HEAD: Normocephalic, No masses, lesions, tenderness or abnormalities EYES: normal, PERRLA, Conjunctiva are pink and non-injected EARS: External ears normal, Canals clear OROPHARYNX:no exudate, no erythema, and lips, buccal mucosa, and tongue normal  NECK: supple, no adenopathy, no JVD LYMPH:  no palpable lymphadenopathy, no hepatosplenomegaly BREAST:not examined LUNGS: clear to auscultation , and palpation HEART: regular rate & rhythm, no murmurs, and no gallops ABDOMEN:abdomen soft, non-tender, normal bowel sounds, and no masses or organomegaly BACK: Back symmetric, no curvature., No CVA tenderness  EXTREMITIES:no joint deformities, effusion, or inflammation, no edema  NEURO: alert & oriented x 3 with fluent speech, no focal motor/sensory deficits  PERFORMANCE STATUS: ECOG 1  LABORATORY DATA: Lab Results  Component Value Date   WBC 7.3 08/27/2023   HGB 13.6 08/27/2023   HCT 40.6 08/27/2023   MCV 91.2 08/27/2023   PLT 255 08/27/2023      Chemistry      Component Value Date/Time   NA 141 08/27/2023 1100   K 4.0 08/27/2023 1100   CL 106 08/27/2023 1100   CO2 32 08/27/2023 1100   BUN 14 08/27/2023 1100   CREATININE 0.80 08/27/2023 1100   CREATININE 1.02 (H) 08/20/2023 0916      Component Value Date/Time   CALCIUM 9.3 08/27/2023 1100   ALKPHOS 67 08/27/2023 1100   AST 21 08/27/2023 1100   ALT 10 08/27/2023 1100   BILITOT 0.4 08/27/2023 1100       RADIOGRAPHIC STUDIES: No results found.  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 IgG level at 1890 (normal <1650) and IgM at 796 (normal <5000). No significant anemia or CBC abnormalities. No concerning symptoms such as weight loss, night sweats, or chest pain. MGUS can be associated with rheumatologic disorders and carries a 1% annual risk of progression to multiple myeloma. Current lab results are not highly alarming, but further evaluation is warranted to rule out hematologic malignancy. - Order myeloma panel to further evaluate MGUS. - If myeloma panel is suspicious, plan for bone marrow biopsy to assess for plasma cell involvement. - If myeloma panel is not concerning, schedule follow-up in six months for lab work monitoring. The patient was advised to call immediately if she has any other concerning symptoms in the interval.  The patient voices understanding of current disease status and treatment options and is in agreement with the current care plan.  All questions were answered. The patient knows to call the clinic with any problems, questions or concerns. We can certainly see the patient much sooner if necessary.  Thank you so much for allowing me to participate in the care of Jamal W Riche. I will continue to follow up the patient with you and assist in her care.  The total time spent in the appointment was 60 minutes  including review of chart and various tests results, discussions about plan of care and coordination of care plan .  Disclaimer: This note was dictated with voice recognition software. Similar sounding words can inadvertently be transcribed and may not be corrected upon review.   Sherrod MARLA Sherrod August 27, 2023, 12:14 PM

## 2023-08-28 ENCOUNTER — Ambulatory Visit: Payer: Self-pay

## 2023-08-28 ENCOUNTER — Encounter: Payer: Self-pay | Admitting: Family Medicine

## 2023-08-28 ENCOUNTER — Ambulatory Visit (INDEPENDENT_AMBULATORY_CARE_PROVIDER_SITE_OTHER): Admitting: Family Medicine

## 2023-08-28 VITALS — BP 108/64 | HR 92 | Temp 98.8°F | Wt 147.2 lb

## 2023-08-28 DIAGNOSIS — S29019A Strain of muscle and tendon of unspecified wall of thorax, initial encounter: Secondary | ICD-10-CM | POA: Diagnosis not present

## 2023-08-28 LAB — POC URINALSYSI DIPSTICK (AUTOMATED)
Bilirubin, UA: NEGATIVE
Blood, UA: NEGATIVE
Glucose, UA: NEGATIVE
Ketones, UA: NEGATIVE
Leukocytes, UA: NEGATIVE
Nitrite, UA: NEGATIVE
Protein, UA: NEGATIVE
Spec Grav, UA: <=1.005 — AB (ref 1.010–1.025)
Urobilinogen, UA: 0.2 U/dL
pH, UA: 6 (ref 5.0–8.0)

## 2023-08-28 LAB — KAPPA/LAMBDA LIGHT CHAINS
Kappa free light chain: 72.9 mg/L — ABNORMAL HIGH (ref 3.3–19.4)
Kappa, lambda light chain ratio: 4.26 — ABNORMAL HIGH (ref 0.26–1.65)
Lambda free light chains: 17.1 mg/L (ref 5.7–26.3)

## 2023-08-28 LAB — BETA 2 MICROGLOBULIN, SERUM: Beta-2 Microglobulin: 1.8 mg/L (ref 0.6–2.4)

## 2023-08-28 MED ORDER — CYCLOBENZAPRINE HCL 10 MG PO TABS: 10.0000 mg | ORAL_TABLET | Freq: Three times a day (TID) | ORAL | 0 refills | Status: AC | PRN

## 2023-08-28 NOTE — Addendum Note (Signed)
 Addended by: LADONNA INOCENTE SAILOR on: 08/28/2023 05:11 PM   Modules accepted: Orders

## 2023-08-28 NOTE — Progress Notes (Signed)
   Subjective:    Patient ID: Christy Moore, female    DOB: 03-Jan-1946, 78 y.o.   MRN: [id number]  HPI Here for one week of a sharp pain in the left side of her trunk that comes and goes. This started the morning after she did a lot of yard work. The pain starts in the left middle back below the shoulder blade and extends around the left side. It often hurts to twist her trunk or to take deep breath. No SOB or coughing.    Review of Systems  Constitutional: Negative.   Respiratory: Negative.    Cardiovascular: Negative.   Musculoskeletal:  Positive for back pain.       Objective:   Physical Exam Constitutional:      Appearance: Normal appearance. She is not ill-appearing.  Cardiovascular:     Rate and Rhythm: Normal rate and regular rhythm.     Pulses: Normal pulses.     Heart sounds: Normal heart sounds.  Pulmonary:     Effort: Pulmonary effort is normal.     Breath sounds: Normal breath sounds.  Musculoskeletal:     Comments: She is tender to the left side of the thoracic spine and around to the left side below the scapula   Skin:    Findings: No erythema or rash.  Neurological:     Mental Status: She is alert.           Assessment & Plan:  Thoracic muscle strain. She can use heat and Flexeril . She can take Tylenol  as needed. Follow up as needed.  Garnette Olmsted, MD

## 2023-08-28 NOTE — Telephone Encounter (Signed)
 FYI Only or Action Required?: FYI only for provider.  Patient was last seen in primary care on 05/20/2023 by Micheal Wolm ORN, MD.  Called Nurse Triage reporting No chief complaint on file..  Symptoms began several days ago.  Interventions attempted: Rest, hydration, or home remedies.  Symptoms are: gradually worsening  Triage Disposition: See HCP Within 4 Hours (Or PCP Triage)  Patient/caregiver understands and will follow disposition?: Yes   Copied from CRM #[id number]. Topic: Clinical - Red Word Triage >> Aug 28, 2023  8:57 AM Suzen RAMAN wrote: Red Word that prompted transfer to Nurse Triage: EXTREME PAIN ON LEFT SIDE,ELEVATED BP AND PULSE Reason for Disposition  [1] SEVERE back pain (e.g., excruciating, unable to do any normal activities) AND [2] not improved 2 hours after pain medicine  Answer Assessment - Initial Assessment Questions 1. ONSET: When did the pain begin? (e.g., minutes, hours, days)     Monday  2. LOCATION: Where does it hurt? (upper, mid or lower back)     Left Sided Back Pain  3. SEVERITY: How bad is the pain?  (e.g., Scale 1-10; mild, moderate, or severe)     Moderate to Severe, Aching, Stabbing  4. PATTERN: Is the pain constant? (e.g., yes, no; constant, intermittent)      Constant  5. RADIATION: Does the pain shoot into your legs or somewhere else?   Under  Breast, Abdomen, Left Side  6. CAUSE:  What do you think is causing the back pain?      Unsure  7. BACK OVERUSE:  Any recent lifting of heavy objects, strenuous work or exercise?     No  8. MEDICINES: What have you taken so far for the pain? (e.g., nothing, acetaminophen , NSAIDS)     Denies  9. NEUROLOGIC SYMPTOMS: Do you have any weakness, numbness, or problems with bowel/bladder control?     Denies  10. OTHER SYMPTOMS: Do you have any other symptoms? (e.g., fever, abdomen pain, burning with urination, blood in urine)      Denies other symptoms  11. PREGNANCY: Is  there any chance you are pregnant? When was your last menstrual period?       No and No  Reviewed red flag symptoms including: - Difficulty breathing - High or persistent fever - Mental status changes - Inability to hydrate - Worsening or unrelieved pain Patient/caregiver instructed to seek immediate care if any of the above develop.  Provided return precautions and escalation instructions appropriate to the concern.  Protocols used: Back Pain-A-AH

## 2023-08-31 LAB — MULTIPLE MYELOMA PANEL, SERUM
Albumin SerPl Elph-Mcnc: 3.5 g/dL (ref 2.9–4.4)
Albumin/Glob SerPl: 1.1 (ref 0.7–1.7)
Alpha 1: 0.2 g/dL (ref 0.0–0.4)
Alpha2 Glob SerPl Elph-Mcnc: 0.7 g/dL (ref 0.4–1.0)
B-Globulin SerPl Elph-Mcnc: 0.8 g/dL (ref 0.7–1.3)
Gamma Glob SerPl Elph-Mcnc: 1.8 g/dL (ref 0.4–1.8)
Globulin, Total: 3.5 g/dL (ref 2.2–3.9)
IgA: 149 mg/dL (ref 64–422)
IgG (Immunoglobin G), Serum: 1479 mg/dL (ref 586–1602)
IgM (Immunoglobulin M), Srm: 622 mg/dL — ABNORMAL HIGH (ref 26–217)
M Protein SerPl Elph-Mcnc: 0.6 g/dL — ABNORMAL HIGH
Total Protein ELP: 7 g/dL (ref 6.0–8.5)

## 2023-09-03 DIAGNOSIS — H53143 Visual discomfort, bilateral: Secondary | ICD-10-CM | POA: Diagnosis not present

## 2023-09-03 DIAGNOSIS — D3132 Benign neoplasm of left choroid: Secondary | ICD-10-CM | POA: Diagnosis not present

## 2023-09-03 DIAGNOSIS — Z961 Presence of intraocular lens: Secondary | ICD-10-CM | POA: Diagnosis not present

## 2023-09-03 DIAGNOSIS — M3501 Sicca syndrome with keratoconjunctivitis: Secondary | ICD-10-CM | POA: Diagnosis not present

## 2023-09-12 ENCOUNTER — Other Ambulatory Visit: Payer: Self-pay | Admitting: Rheumatology

## 2023-09-14 NOTE — Telephone Encounter (Signed)
 Please schedule patient a follow up visit. Patient due November 2025. Thanks!   Follow-Up Instructions: Return in about 3 months (around 11/25/2023) for Rheumatoid arthritis.

## 2023-09-14 NOTE — Telephone Encounter (Signed)
 Last Fill: 08/21/2023 (30 day supply)  Labs: 08/27/2023 Glucose 100, Anion Gap 3  Next Visit: Due November 2025. Message sent to the front to schedule.   Last Visit: 08/25/2023  DX: Rheumatoid arthritis of multiple sites with negative rheumatoid factor   Current Dose per office note 08/25/2023: methotrexate  6 tablets p.o. weekly   Okay to refill Methotrexate ?

## 2023-09-15 ENCOUNTER — Other Ambulatory Visit: Payer: Self-pay | Admitting: *Deleted

## 2023-09-15 DIAGNOSIS — Z79899 Other long term (current) drug therapy: Secondary | ICD-10-CM | POA: Diagnosis not present

## 2023-09-15 LAB — CBC WITH DIFFERENTIAL/PLATELET
Absolute Lymphocytes: 706 {cells}/uL — ABNORMAL LOW (ref 850–3900)
Absolute Monocytes: 338 {cells}/uL (ref 200–950)
Basophils Absolute: 22 {cells}/uL (ref 0–200)
Basophils Relative: 0.3 %
Eosinophils Absolute: 130 {cells}/uL (ref 15–500)
Eosinophils Relative: 1.8 %
HCT: 42.1 % (ref 35.0–45.0)
Hemoglobin: 13.9 g/dL (ref 11.7–15.5)
MCH: 30.9 pg (ref 27.0–33.0)
MCHC: 33 g/dL (ref 32.0–36.0)
MCV: 93.6 fL (ref 80.0–100.0)
MPV: 13.1 fL — ABNORMAL HIGH (ref 7.5–12.5)
Monocytes Relative: 4.7 %
Neutro Abs: 6005 {cells}/uL (ref 1500–7800)
Neutrophils Relative %: 83.4 %
Platelets: 216 Thousand/uL (ref 140–400)
RBC: 4.5 Million/uL (ref 3.80–5.10)
RDW: 15 % (ref 11.0–15.0)
Total Lymphocyte: 9.8 %
WBC: 7.2 Thousand/uL (ref 3.8–10.8)

## 2023-09-15 LAB — COMPREHENSIVE METABOLIC PANEL WITH GFR
AG Ratio: 1.3 (calc) (ref 1.0–2.5)
ALT: 13 U/L (ref 6–29)
AST: 27 U/L (ref 10–35)
Albumin: 4 g/dL (ref 3.6–5.1)
Alkaline phosphatase (APISO): 62 U/L (ref 37–153)
BUN: 13 mg/dL (ref 7–25)
CO2: 28 mmol/L (ref 20–32)
Calcium: 9.7 mg/dL (ref 8.6–10.4)
Chloride: 102 mmol/L (ref 98–110)
Creat: 0.89 mg/dL (ref 0.60–1.00)
Globulin: 3.2 g/dL (ref 1.9–3.7)
Glucose, Bld: 123 mg/dL — ABNORMAL HIGH (ref 65–99)
Potassium: 4.6 mmol/L (ref 3.5–5.3)
Sodium: 139 mmol/L (ref 135–146)
Total Bilirubin: 0.6 mg/dL (ref 0.2–1.2)
Total Protein: 7.2 g/dL (ref 6.1–8.1)
eGFR: 67 mL/min/1.73m2 (ref >=60)

## 2023-09-16 ENCOUNTER — Ambulatory Visit: Payer: Self-pay | Admitting: Rheumatology

## 2023-09-16 NOTE — Progress Notes (Signed)
 Glucose is mildly elevated, probably not a fasting sample.  Lymphocyte count is low due to immunosuppression.  We will continue to monitor labs.

## 2023-09-30 ENCOUNTER — Ambulatory Visit: Admitting: Pulmonary Disease

## 2023-09-30 ENCOUNTER — Encounter: Payer: Self-pay | Admitting: Pulmonary Disease

## 2023-09-30 VITALS — BP 120/70 | HR 68 | Ht 64.5 in | Wt 147.0 lb

## 2023-09-30 DIAGNOSIS — J0110 Acute frontal sinusitis, unspecified: Secondary | ICD-10-CM

## 2023-09-30 DIAGNOSIS — J479 Bronchiectasis, uncomplicated: Secondary | ICD-10-CM | POA: Diagnosis not present

## 2023-09-30 DIAGNOSIS — M3502 Sicca syndrome with lung involvement: Secondary | ICD-10-CM

## 2023-09-30 DIAGNOSIS — J019 Acute sinusitis, unspecified: Secondary | ICD-10-CM | POA: Diagnosis not present

## 2023-09-30 MED ORDER — PREDNISONE 10 MG PO TABS: 10.0000 mg | ORAL_TABLET | Freq: Every day | ORAL | 0 refills | Status: DC

## 2023-09-30 NOTE — Patient Instructions (Addendum)
 Continue nebulizer treatments as needed  Continue methotrexate  weekly, continue folic acid  daily.   Start prednisone  10mg  daily for 7 days for sinusitis  Follow up in 1 year

## 2023-09-30 NOTE — Progress Notes (Signed)
 Synopsis: Referred in May 2025 for Chronic Cough  Subjective:   PATIENT ID: Christy Moore, Christy Moore   HPI  Chief Complaint  Patient presents with   Medical Management of Chronic Issues    PT states has a bit of a cold    Christy Moore is a 78 year old woman, never smoker with GERD, sjogren's syndrome, esophageal narrowing s/p dilation and hypertension who returns to pulmonary clinic for Sjogren's disease with lung involvement and bronchiectasis.   She experiences significant head congestion and difficulty breathing, which she attributes to a cold. She also has a sore throat. Sinus drainage is present, and she is concerned about the congestion moving into her chest.  A pinched nerve near the scapula complicates deep breathing, with pain exacerbated by muscle tension.  OV 07/01/23 She was treated for pneumonia in 03/06/23 with augmentin  and azithromycin . She went to urgent care 05/17/23 for on going cough. She was treated with prednisone  and doxycycline . Chest radiograph 5/11 shows chronic interstitial prominence at both lung bases. CTA Chest 05/03/22 showed cylindrical bronchiectasis in bilateral lower lobes with mild subpleural streaky opacities.  She developed a sore throat last Thursday, which progressed to sinus congestion and a persistent cough. The cough disrupts her sleep, allowing only two hours of rest last night. She experienced a fever of 100.28F and 54F over the weekend. Wheezing sometimes wakes her up.  She has experienced pneumonia earlier this year and in December, which resolved after treatment. She has a history of bronchitis in childhood. She denies any smoking history.  She is currently taking prednisone  and doxycycline  for her symptoms. She experienced significant heartburn after taking doxycycline  without sufficient water, which took a day and a half to resolve. She has a history of GERD and esophageal narrowing, which was dilated  years ago. She reports difficulty swallowing and frequent choking due to dry mouth, attributed to her Sjogren's disease.  She has Sjogren's disease, which took years to diagnose, and has been informed it can affect her lungs. She had a CT scan done a year ago. She uses an albuterol  inhaler, which was given to her recently, but was not provided with instructions on its use. She uses Flonase  for nasal congestion and saltwater gargles for her sore throat.   OV 07/01/23 PFTs show mild restriction and mild diffusion defect.  HRCT Chest shows bibasilar bronchiectasis similar to prior scans.  She reports she is seeing a Publishing rights manager with [employer]. She has seen Dr. Mai in the past.   Past Medical History:  Diagnosis Date   Arthritis    Cardiomegaly    Chronic pneumonia    Depression    GERD (gastroesophageal reflux disease)    Hypertension    Hypothyroidism    Raynaud disease    Seizures (HCC)    Sinus tachycardia    Sjogren's syndrome    Status post dilation of esophageal narrowing 2012   Dr. Luis     Family History  Problem Relation Age of Onset   Cancer Mother        breast and lung   Stroke Mother 56   Hypothyroidism Mother    Cancer Father        prostate   Heart attack Father 4   Alcohol abuse Maternal Grandmother    Alcohol abuse Maternal Grandfather    Allergies Other        Grandmother-pt states she takes after grandmother who had multiple allergies   Colon  cancer Neg Hx    Esophageal cancer Neg Hx    Stomach cancer Neg Hx    Rectal cancer Neg Hx      Social History   Socioeconomic History   Marital status: Married    Spouse name: Not on file   Number of children: 2   Years of education: Not on file   Highest education level: Some college, no degree  Occupational History   Occupation: retired  Tobacco Use   Smoking status: Never    Passive exposure: Past   Smokeless tobacco: Never  Vaping Use   Vaping status: Never Used  Substance and  Sexual Activity   Alcohol use: Not Currently    Comment: rarely   Drug use: No   Sexual activity: Not on file  Other Topics Concern   Not on file  Social History Narrative   Not on file   Social Drivers of Health   Financial Resource Strain: Low Risk  (11/19/2022)   Overall Financial Resource Strain (CARDIA)    Difficulty of Paying Living Expenses: Not hard at all  Food Insecurity: No Food Insecurity (08/27/2023)   Hunger Vital Sign    Worried About Running Out of Food in the Last Year: Never true    Ran Out of Food in the Last Year: Never true  Transportation Needs: No Transportation Needs (08/27/2023)   PRAPARE - Administrator, Civil Service (Medical): No    Lack of Transportation (Non-Medical): No  Physical Activity: Insufficiently Active (11/19/2022)   Exercise Vital Sign    Days of Exercise per Week: 2 days    Minutes of Exercise per Session: 20 min  Stress: No Stress Concern Present (11/19/2022)   [employer] of Occupational Health - Occupational Stress Questionnaire    Feeling of Stress : Not at all  Social Connections: Socially Integrated (11/19/2022)   Social Connection and Isolation Panel    Frequency of Communication with Friends and Family: More than three times a week    Frequency of Social Gatherings with Friends and Family: Twice a week    Attends Religious Services: More than 4 times per year    Active Member of [employer] or Organizations: Yes    Attends Engineer, structural: More than 4 times per year    Marital Status: Married  Catering manager Violence: Not At Risk (08/27/2023)   Humiliation, Afraid, Rape, and Kick questionnaire    Fear of Current or Ex-Partner: No    Emotionally Abused: No    Physically Abused: No    Sexually Abused: No     Allergies  Allergen Reactions   Albuterol -Budesonide Other (See Comments)    Burned her tongue  albuterol  / budesonide   Amoxicillin -Pot Clavulanate     REACTION: nausea   Azithromycin       REACTION: nausea, Gi  Other Reaction(s): Unknown   Broccoli [Brassica Oleracea]    Celecoxib Other (See Comments)    REACTION: nausea  celecoxib   Ciprofloxacin Other (See Comments)    REACTION: nausea  ciprofloxacin   Doxycycline  Other (See Comments)    REACTION: gi upset   Erythromycin Other (See Comments)    REACTION: nause, GI   Hydrocodone Other (See Comments)    REACTION: nausea   Hydrocodone-Acetaminophen      REACTION: GI upset   Levofloxacin Other (See Comments)    REACTION: she claims renal failiure  levofloxacin   Omeprazole  Nausea And Vomiting and Other (See Comments)    omeprazole   Penicillins    Propoxyphene N-Acetaminophen     Rofecoxib     REACTION: nausea   Sulfa Antibiotics    Tetanus Toxoid      Outpatient Medications Prior to Visit  Medication Sig Dispense Refill   albuterol  (VENTOLIN  HFA) 108 (90 Base) MCG/ACT inhaler Inhale 1-2 puffs into the lungs every 4 (four) hours as needed for wheezing or shortness of breath.     amLODipine  (NORVASC ) 5 MG tablet Take 1 tablet (5 mg total) by mouth daily. 90 tablet 3   cyclobenzaprine  (FLEXERIL ) 10 MG tablet Take 1 tablet (10 mg total) by mouth 3 (three) times daily as needed for muscle spasms. 60 tablet 0   famotidine  (PEPCID ) 40 MG tablet TAKE 1 TABLET BY MOUTH EVERYDAY AT BEDTIME 90 tablet 0   fluticasone  (FLONASE ) 50 MCG/ACT nasal spray Place 2 sprays into both nostrils daily as needed for allergies.     folic acid  (FOLVITE ) 1 MG tablet Take 2 tablets (2 mg total) by mouth daily. 180 tablet 3   levothyroxine  (SYNTHROID ) 100 MCG tablet TAKE 1 TABLET BY MOUTH EVERY DAY 90 tablet 3   methotrexate  (RHEUMATREX) 2.5 MG tablet 6 TABLETS BY MOUTH ONCE A WEEK 72 tablet 0   No facility-administered medications prior to visit.   Review of Systems  Constitutional:  Negative for chills, fever, malaise/fatigue and weight loss.  HENT:  Positive for congestion and sinus pain. Negative for sore throat.   Eyes:  Negative.   Respiratory:  Negative for cough, hemoptysis, sputum production, shortness of breath and wheezing.   Cardiovascular:  Negative for chest pain, palpitations, orthopnea, claudication and leg swelling.  Gastrointestinal:  Positive for heartburn. Negative for abdominal pain, nausea and vomiting.  Genitourinary: Negative.   Musculoskeletal:  Negative for joint pain and myalgias.  Skin:  Negative for rash.  Neurological:  Positive for headaches. Negative for weakness.  Endo/Heme/Allergies: Negative.   Psychiatric/Behavioral: Negative.     Objective:   Vitals:   09/30/23 1129  BP: 120/70  Pulse: 68  SpO2: 99%  Weight: 147 lb (66.7 kg)  Height: 5' 4.5 (1.638 m)    Physical Exam Constitutional:      General: She is not in acute distress.    Appearance: Normal appearance.  HENT:     Nose: No congestion.  Eyes:     General: No scleral icterus.    Conjunctiva/sclera: Conjunctivae normal.  Cardiovascular:     Rate and Rhythm: Normal rate and regular rhythm.  Pulmonary:     Breath sounds: No wheezing, rhonchi or rales.  Musculoskeletal:     Right lower leg: No edema.     Left lower leg: No edema.  Skin:    General: Skin is warm and dry.  Neurological:     General: No focal deficit present.    CBC    Component Value Date/Time   WBC 7.2 09/15/2023 1138   RBC 4.50 09/15/2023 1138   HGB 13.9 09/15/2023 1138   HGB 13.6 08/27/2023 1100   HCT 42.1 09/15/2023 1138   PLT 216 09/15/2023 1138   PLT 255 08/27/2023 1100   MCV 93.6 09/15/2023 1138   MCV 95.4 08/05/2012 2021   MCH 30.9 09/15/2023 1138   MCHC 33.0 09/15/2023 1138   RDW 15.0 09/15/2023 1138   LYMPHSABS 0.8 08/27/2023 1100   MONOABS 0.4 08/27/2023 1100   EOSABS 130 09/15/2023 1138   BASOSABS 22 09/15/2023 1138      Latest Ref Rng & Units 09/15/2023   11:38 AM  08/27/2023   11:00 AM 08/20/2023    9:16 AM  BMP  Glucose 65 - 99 mg/dL 876  899  884   BUN 7 - 25 mg/dL 13  14  16    Creatinine 0.60 - 1.00  mg/dL 9.10  9.19  8.97   BUN/Creat Ratio 6 - 22 (calc) SEE NOTE:   16   Sodium 135 - 146 mmol/L 139  141  140   Potassium 3.5 - 5.3 mmol/L 4.6  4.0  4.4   Chloride 98 - 110 mmol/L 102  106  103   CO2 20 - 32 mmol/L 28  32  28   Calcium 8.6 - 10.4 mg/dL 9.7  9.3  9.4    Chest imaging: OV 07/01/23 1. Bibasilar bronchiectasis and volume loss, findings which favor scarring. Difficult to definitively exclude interstitial lung disease. Findings are indeterminate for UIP per consensus guidelines: Diagnosis of Idiopathic Pulmonary Fibrosis: An Official ATS/ERS/JRS/ALAT Clinical Practice Guideline. Am JINNY [employer] Crit Care Med Vol 198, Iss 5, [phone], Sep 06 2016. 2. Trace left pleural effusion. 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 4. Aortic atherosclerosis (ICD10-I70.0). Coronary artery calcification. 5. Enlarged pulmonic trunk, indicative of pulmonary arterial hypertension.  05/17/23 Chronic interstitial prominence at both lung bases with probable chronic asymmetric left basilar scarring. No evidence of acute cardiopulmonary process.  CTA Chest 05/03/22 Mediastinum/Nodes: No enlarged mediastinal, hilar, or axillary lymph nodes. Thyroid  gland, trachea, and esophagus demonstrate no significant findings.   Lungs/Pleura: Cylindrical bronchiectasis in bilateral lower lobes with mild subpleural streaky opacities which may represent atelectasis or chronic interstitial lung changes. No focal airspace consolidation.  PFT:    Latest Ref Rng & Units 07/01/2023    9:59 AM  PFT Results  FVC-Pre L 1.85   FVC-Predicted Pre % 65   FVC-Post L 1.90   FVC-Predicted Post % 67   Pre FEV1/FVC % % 81   Post FEV1/FCV % % 81   FEV1-Pre L 1.50   FEV1-Predicted Pre % 70   FEV1-Post L 1.53   DLCO uncorrected ml/min/mmHg 14.63   DLCO UNC% % 75   DLCO corrected ml/min/mmHg 14.63   DLCO COR %Predicted % 75   DLVA Predicted % 128   TLC L 3.54   TLC % Predicted  % 69   RV % Predicted % 66     Labs:  Path:  Echo 05/22/20: LV EF 50-55%. Grade I diastolic dysfunction. RV systolic function and size is normal.  Heart Catheterization:     Assessment & Plan:   Sjogren's syndrome with lung involvement (HCC)  Discussion: Christy Moore is a 78 year old woman, never smoker with GERD, sjogren's syndrome, esophageal narrowing s/p dilation and hypertension who is referred to pulmonary clinic for chronic cough.   Bronchiectasis without complication CT Chest with lower lobe bronchiectasis involvement - continue duonebs - Use nebulizer as needed. Will add flutter valve if needed  Sjogren's syndrome with lung involvement - On methotrexate  therapy weekly - No progressive changes on recent HRCT Chest  Acute Sinusitis - start prednisone  10mg  daily for 7 days for symptom relief - felt related to allergies, holding off on antibiotics at this time  Follow up in 1 year  Dorn Chill, MD [facility] Office: [phone]    Current Outpatient Medications:    albuterol  (VENTOLIN  HFA) 108 (90 Base) MCG/ACT inhaler, Inhale 1-2 puffs into the lungs every 4 (four) hours as needed for wheezing or shortness of breath., Disp: ,  Rfl:    amLODipine  (NORVASC ) 5 MG tablet, Take 1 tablet (5 mg total) by mouth daily., Disp: 90 tablet, Rfl: 3   cyclobenzaprine  (FLEXERIL ) 10 MG tablet, Take 1 tablet (10 mg total) by mouth 3 (three) times daily as needed for muscle spasms., Disp: 60 tablet, Rfl: 0   famotidine  (PEPCID ) 40 MG tablet, TAKE 1 TABLET BY MOUTH EVERYDAY AT BEDTIME, Disp: 90 tablet, Rfl: 0   fluticasone  (FLONASE ) 50 MCG/ACT nasal spray, Place 2 sprays into both nostrils daily as needed for allergies., Disp: , Rfl:    folic acid  (FOLVITE ) 1 MG tablet, Take 2 tablets (2 mg total) by mouth daily., Disp: 180 tablet, Rfl: 3   levothyroxine  (SYNTHROID ) 100 MCG tablet, TAKE 1 TABLET BY MOUTH EVERY DAY, Disp: 90 tablet, Rfl: 3   methotrexate   (RHEUMATREX) 2.5 MG tablet, 6 TABLETS BY MOUTH ONCE A WEEK, Disp: 72 tablet, Rfl: 0

## 2023-10-04 ENCOUNTER — Encounter: Payer: Self-pay | Admitting: Pulmonary Disease

## 2023-10-06 ENCOUNTER — Other Ambulatory Visit: Payer: Self-pay | Admitting: *Deleted

## 2023-10-06 DIAGNOSIS — Z79899 Other long term (current) drug therapy: Secondary | ICD-10-CM

## 2023-10-09 ENCOUNTER — Encounter: Admitting: Rheumatology

## 2023-10-11 ENCOUNTER — Other Ambulatory Visit: Payer: Self-pay | Admitting: Family Medicine

## 2023-10-11 DIAGNOSIS — I10 Essential (primary) hypertension: Secondary | ICD-10-CM

## 2023-10-11 DIAGNOSIS — K219 Gastro-esophageal reflux disease without esophagitis: Secondary | ICD-10-CM

## 2023-11-03 NOTE — Progress Notes (Signed)
 Office Visit Note  Patient: Christy Moore             Date of Birth: 1945/11/21           MRN: [id number]             PCP: Micheal Wolm LELON, MD Referring: Micheal Wolm LELON, MD Visit Date: 11/17/2023 Occupation: Data Unavailable  Subjective:  Medication monitoring   History of Present Illness: Christy Moore is a 78 y.o. female with history of seronegative rheumatoid arthritis and sjogren's syndrome.  Patient was initially seen in the office on 07/16/2023 at which time the patient was initiated on methotrexate .  Patient remains on  methotrexate  3 tablets every Sunday morning and 3 tablets every Sunday evening weekly along with folic acid  2 mg by mouth daily.  Patient experiences some increased GI discomfort and fatigue on Monday which typically resolves by Tuesday.  She is considering reducing the dose of methotrexate  to see if the tolerability will improve.  She has not had any recurrence of joint pain or joint swelling in her hands since initiating methotrexate . She has noticed an increase frequency of Raynaud's phenomenon which she attributes to the colder weather temperatures.  She continues to have chronic sicca symptoms and has been using Biotene products for mouth dryness.  She sees the dentist every 6 months as encouraged.  She has a prescription for Vevye (cyclosporine 0.1%) solution which she uses as needed for her eyes.  She denies taking any oral prednisone  recently. Patient states that she has been experiencing an elevated heart rate for many years but most recently had an episode where her heart rate was above 100 prompting further evaluation.  She is unsure if this is related to diagnosis of sjogren's syndrome.  Patient will be having an updated EKG soon.     Activities of Daily Living:  Patient reports morning stiffness for 1 hour.   Patient Denies nocturnal pain.  Difficulty dressing/grooming: Denies Difficulty climbing stairs: Denies Difficulty getting out of chair:  Reports Difficulty using hands for taps, buttons, cutlery, and/or writing: Denies  Review of Systems  Constitutional:  Positive for fatigue.  HENT:  Positive for mouth dryness. Negative for mouth sores.   Eyes:  Positive for dryness.  Respiratory:  Positive for shortness of breath.   Cardiovascular:  Negative for chest pain and palpitations.  Gastrointestinal:  Positive for constipation and diarrhea. Negative for blood in stool.  Endocrine: Negative for increased urination.  Genitourinary:  Negative for involuntary urination.  Musculoskeletal:  Positive for joint pain, joint pain, myalgias, morning stiffness and myalgias. Negative for gait problem, joint swelling, muscle weakness and muscle tenderness.  Skin:  Negative for color change, rash, hair loss and sensitivity to sunlight.  Allergic/Immunologic: Negative for susceptible to infections.  Neurological:  Positive for headaches. Negative for dizziness.  Hematological:  Negative for swollen glands.  Psychiatric/Behavioral:  Positive for depressed mood. Negative for sleep disturbance. The patient is not nervous/anxious.     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   Depression 07/13/2020   Thrush 11/03/2012   Sjogren's disease 01/22/2012   GERD (gastroesophageal reflux disease) 03/19/2011   Hypertension 03/19/2011   Raynaud's disease 03/19/2011   Cardiomegaly 02/26/2010   Hypothyroidism 02/26/2010   Wrist fracture, left 02/26/2010   Liver hemangioma 02/26/2010   Migraine 02/26/2010   Esophageal stricture 02/26/2010   Weight loss 02/26/2010   OTHER CHRONIC  SINUSITIS 12/20/2007   Rosacea 12/20/2007    Past Medical History:  Diagnosis Date   Arthritis    Cardiomegaly    Chronic pneumonia    Depression    GERD (gastroesophageal reflux disease)    Hypertension    Hypothyroidism    Raynaud disease    Seizures (HCC)    Sinus  tachycardia    Sjogren's syndrome    Status post dilation of esophageal narrowing 2012   Dr. Luis    Family History  Problem Relation Age of Onset   Cancer Mother        breast and lung   Stroke Mother 42   Hypothyroidism Mother    Cancer Father        prostate   Heart attack Father 24   Alcohol abuse Maternal Grandmother    Alcohol abuse Maternal Grandfather    Allergies Other        Grandmother-pt states she takes after grandmother who had multiple allergies   Colon cancer Neg Hx    Esophageal cancer Neg Hx    Stomach cancer Neg Hx    Rectal cancer Neg Hx    Past Surgical History:  Procedure Laterality Date   BASAL CELL CARCINOMA EXCISION  2024   CATARACT EXTRACTION, BILATERAL  2024   HAND TENDON SURGERY  02/09/2007   tendon transfer-Dr. Camella   NASAL SEPTUM SURGERY     operated on by Dr. Floy 20 years ago   OVARIAN CYST REMOVAL     Right Sided Salpingo-oopherectomy   SALPINGOOPHORECTOMY     WISDOM TOOTH EXTRACTION     Social History   Tobacco Use   Smoking status: Never    Passive exposure: Past   Smokeless tobacco: Never  Vaping Use   Vaping status: Never Used  Substance Use Topics   Alcohol use: Not Currently    Comment: rarely   Drug use: No   Social History   Social History Narrative   Not on file     Immunization History  Administered Date(s) Administered   Fluad Quad(high Dose 65+) 09/16/2018, 10/03/2019, 09/27/2021   INFLUENZA, HIGH DOSE SEASONAL PF 09/14/2014, 09/07/2015, 10/07/2016, 10/26/2017, 09/16/2018, 09/21/2020   Influenza Split 11/22/2011   Influenza,inj,Quad PF,6+ Mos 10/05/2013   Influenza,inj,quad, With Preservative 10/06/2016, 11/02/2017   Influenza-Unspecified 11/22/2011, 10/05/2013, 09/14/2014, 09/07/2015, 10/06/2016, 10/07/2016, 10/26/2017, 11/02/2017   PFIZER(Purple Top)SARS-COV-2 Vaccination 02/11/2019, 03/08/2019, 10/04/2019, 04/20/2020   Pfizer Covid-19 Vaccine Bivalent Booster 61yrs & up 10/08/2020   Pneumococcal  Conjugate-13 10/05/2013   Pneumococcal Polysaccharide-23 02/19/2015   Zoster Recombinant(Shingrix) 10/08/2020   Zoster, Live 01/17/2010     Objective: Vital Signs: BP 119/75   Pulse 96   Temp 98 F (36.7 C)   Resp 14   Ht 5' 4.5 (1.638 m)   Wt 141 lb 3.2 oz (64 kg)   BMI 23.86 kg/m    Physical Exam Vitals and nursing note reviewed.  Constitutional:      Appearance: She is well-developed.  HENT:     Head: Normocephalic and atraumatic.  Eyes:     Conjunctiva/sclera: Conjunctivae normal.  Cardiovascular:     Rate and Rhythm: Normal rate and regular rhythm.     Heart sounds: Normal heart sounds.  Pulmonary:     Effort: Pulmonary effort is normal.     Breath sounds: Normal breath sounds.  Abdominal:     General: Bowel sounds are normal.     Palpations: Abdomen is soft.  Musculoskeletal:     Cervical back: Normal range  of motion.  Lymphadenopathy:     Cervical: No cervical adenopathy.  Skin:    General: Skin is warm and dry.     Capillary Refill: Capillary refill takes less than 2 seconds.  Neurological:     Mental Status: She is alert and oriented to person, place, and time.  Psychiatric:        Behavior: Behavior normal.      Musculoskeletal Exam: C-spine has limited range of motion with lateral rotation.  Shoulder joints, elbow joints, wrist joints, MCPs, PIPs, DIPs have good range of motion with no synovitis.  PIP and DIP thickening consistent with osteoarthritis of both hands.  Hip joints have good range of motion with no groin pain.  Knee joints have good range of motion no warmth or effusion.  Ankle joints have good range of motion with no tenderness or joint swelling.  CDAI Exam: CDAI Score: -- Patient Global: --; Provider Global: -- Swollen: --; Tender: -- Joint Exam 11/17/2023   No joint exam has been documented for this visit   There is currently no information documented on the homunculus. Go to the Rheumatology activity and complete the homunculus  joint exam.  Investigation: No additional findings.  Imaging: No results found.  Recent Labs: Lab Results  Component Value Date   WBC 7.2 09/15/2023   HGB 13.9 09/15/2023   PLT 216 09/15/2023   NA 139 09/15/2023   K 4.6 09/15/2023   CL 102 09/15/2023   CO2 28 09/15/2023   GLUCOSE 123 (H) 09/15/2023   BUN 13 09/15/2023   CREATININE 0.89 09/15/2023   BILITOT 0.6 09/15/2023   ALKPHOS 67 08/27/2023   AST 27 09/15/2023   ALT 13 09/15/2023   PROT 7.2 09/15/2023   ALBUMIN 4.1 08/27/2023   CALCIUM 9.7 09/15/2023   GFRAA >60 12/11/2018   QFTBGOLDPLUS NEGATIVE 07/16/2023    Speciality Comments: No specialty comments available.  Procedures:  No procedures performed Allergies: Albuterol -budesonide, Amoxicillin -pot clavulanate, Azithromycin , Broccoli [brassica oleracea], Celecoxib, Ciprofloxacin, Doxycycline , Erythromycin, Hydrocodone, Hydrocodone-acetaminophen , Levofloxacin, Omeprazole , Penicillins, Propoxyphene n-acetaminophen , Rofecoxib, Sulfa antibiotics, and Tetanus toxoid    Assessment / Plan:     Visit Diagnoses: Rheumatoid arthritis of multiple sites with negative rheumatoid factor (HCC) - RF negative, anti-CCP positive, elevated sed rate severe inflammatory arthritis: She has no synovitis on examination today.  No recurrence of hand pain or swelling since initiating methotrexate .  She has been taking methotrexate  3 tablets every Sunday morning and 3 tablets every Sunday evening on a weekly basis.  On Mondays she notices increased fatigue and GI upset.  She would like to discuss reducing the dose of methotrexate  and trying 5 tablets once weekly.  Discussed the option of switching to injectable methotrexate  but she has a needle phobia. She will try taking methotrexate  at a reduced dose of 5 tablets once weekly and remain on folic acid  2 mg daily.  She will notify us  if she continues to have the side effects or notices any recurrence of joint pain or inflammation.  She will  follow-up in the office in 5 months or sooner if needed.  High risk medication use - Methotrexate  5 tablets by mouth once weekly along with folic acid  2 mg by mouth daily. Declined switching to injectable methotrexate  due to needle phobia.  Fatigue and GI upset taking methotrexate  6 tablets weekly--despite splitting the dose to 3 tablets in the morning and 3 tablets in the evening. CBC and CMP updated on 09/15/23.  Her next lab work will be due  in December and every 3 months to monitor for drug toxicity. No recent or recurrent infections. Discussed the importance of holding methotrexate  if she develops signs or symptoms of an infection and to resume once the infection has completely cleared.   Sjogren's syndrome with other organ involvement - Positive ANA, positive SSA, sicca symptoms, mild ILD. She continues to have chronic sicca symptoms.  She is been using Biotene products for mouth dryness.  She has a prescription for cyclosporine 0.1% ophthalmic solution which she uses as needed under the care of ophthalmology. Discussed the importance of remaining up-to-date with dental visits and ophthalmology visits.  Discussed that she may benefit from quarterly visits with the dentist. Discussed increased risk for developing neuropathy in patients with Sjogren syndrome. Patient is under the care of Dr. Kara for mild ILD-not currently taking any anti-fibrotics.  She will be following up with Dr. Kara yearly.  She remains on methotrexate  6 tablets once weekly and folic acid  2 mg daily.  She has not had any recurrence of increased joint pain or joint swelling since initiating methotrexate .  She has not required prednisone  use recently.  She has had increased fatigue and GI upset the day after taking methotrexate  on a weekly basis.  She would like to try reducing the dose of methotrexate  to 5 tablets once weekly.  She will notify us  if she develops any new or worsening symptoms.  Raynaud's disease without  gangrene: She has intermittent symptoms of raynaud's.  No digital ulcers. No signs of sclerodactyly.  Discussed the importance of keeping her core body temperature warm and using hand warmers, gloves, and socks.  She will notify us  if she develops any new or worsening symptoms.  ILD (interstitial lung disease) (HCC) - Mild ILD noted on the high-resolution CT.  Patient is followed by Dr. Sigrid office visit note from 09/30/2023-recommended follow-up in 1 year.  No new or worsening pulmonary symptoms.  Bronchiectasis without complication (HCC) - Recurrent pneumonia 3-4 episodes in the last 2 years. No recurrence since completing course of doxycyline x14 days.    Primary osteoarthritis of both hands: X-rays of both hands were consistent with osteoarthritis on 07/16/2023.  No erosive changes were noted.  No recurrence of pain or swelling since initiating methotrexate .  No synovitis noted.   Closed fracture of left wrist, sequela: Did not require surgical invention.  Managed by Dr. Camella.   Primary osteoarthritis of both feet: X-rays of both feet were consistent with osteoarthritis on 07/16/2023.  No erosive changes were noted. No tenderness or swelling of ankle joints.   DDD (degenerative disc disease), cervical: Evaluated by Dr. Kay in the past.  C-spine has limited range of motion with lateral rotation.  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  Patient has an appointment coming up with Dr. Sherrod.  Primary hypertension - BP 119/75 today.  Gastroesophageal reflux disease with esophagitis without hemorrhage  Esophageal stricture  Liver hemangioma  Cardiomegaly  Other specified hypothyroidism  Rosacea  History of migraine  History of osteopenia: DEXA obtained on 06/25/2020: Lumbar spine T-score -1.1.  Right femoral neck T-score -1.6.  Left femoral neck T-score -1.8.  Discussed the recommendation for patients with  osteopenia and is to have an updated DEXA every 2 years. Overdue to update a bone density.  Patient would like to hold off on scheduling updated DEXA at this time.  Orders: No orders of the defined types were placed in this encounter.  No orders of the defined types were placed in this encounter.    Follow-Up Instructions: Return in about 5 months (around 04/16/2024) for Rheumatoid arthritis, Sjogren's syndrome.   Waddell CHRISTELLA Craze, PA-C  Note - This record has been created using Dragon software.  Chart creation errors have been sought, but may not always  have been located. Such creation errors do not reflect on  the standard of medical care.

## 2023-11-11 ENCOUNTER — Telehealth: Payer: Self-pay | Admitting: Cardiovascular Disease

## 2023-11-11 NOTE — Telephone Encounter (Signed)
 Pt c/o BP issue: STAT if pt c/o blurred vision, one-sided weakness or slurred speech  1. What are your last 5 BP readings? 131/70 before lunch   2. Are you having any other symptoms (ex. Dizziness, headache, blurred vision, passed out)? Very fatigue, some fuzzy thinking and no appetite.   3. What is your BP issue? Pt has some concerns and would like to discuss with a nurse.    STAT if HR is under 50 or over 120  (normal HR is 60-100 beats per minute)  What is your heart rate? 100 hr before lunch and 96 hr after   Do you have a log of your heart rate readings (document readings)? No   Do you have any other symptoms? Very fatigue, some fuzzy thinking and no appetite.

## 2023-11-11 NOTE — Telephone Encounter (Signed)
 Spoke with patient who stated her HR today was 100, 96, & 94 After reviewing chart HR often runs in 90's  No energy, tired  She is aware of heart at times Does have Sjogren's syndrome  Will forward to Reche ORN NP for review

## 2023-11-12 ENCOUNTER — Ambulatory Visit: Admitting: Rheumatology

## 2023-11-12 NOTE — Telephone Encounter (Signed)
 Advised patient and will ask scheduling to reach out to patient to get scheduled

## 2023-11-12 NOTE — Telephone Encounter (Signed)
 Heart rate routinely in the 90s by prior visits. Last seen 2 years ago, would recommend schedule OV for EKG and evaluation. Her fatigue is overall nonspecific and low suspicion for cardiac etiology, would advise follow up with PCP as well.  Andros Channing S Sybilla Malhotra, NP

## 2023-11-15 ENCOUNTER — Other Ambulatory Visit: Payer: Self-pay | Admitting: Family Medicine

## 2023-11-17 ENCOUNTER — Ambulatory Visit: Attending: Physician Assistant | Admitting: Physician Assistant

## 2023-11-17 ENCOUNTER — Encounter: Payer: Self-pay | Admitting: Physician Assistant

## 2023-11-17 VITALS — BP 119/75 | HR 96 | Temp 98.0°F | Resp 14 | Ht 64.5 in | Wt 141.2 lb

## 2023-11-17 DIAGNOSIS — M3509 Sicca syndrome with other organ involvement: Secondary | ICD-10-CM | POA: Insufficient documentation

## 2023-11-17 DIAGNOSIS — Z79899 Other long term (current) drug therapy: Secondary | ICD-10-CM | POA: Diagnosis present

## 2023-11-17 DIAGNOSIS — M19072 Primary osteoarthritis, left ankle and foot: Secondary | ICD-10-CM | POA: Diagnosis present

## 2023-11-17 DIAGNOSIS — M79671 Pain in right foot: Secondary | ICD-10-CM

## 2023-11-17 DIAGNOSIS — D1803 Hemangioma of intra-abdominal structures: Secondary | ICD-10-CM | POA: Insufficient documentation

## 2023-11-17 DIAGNOSIS — K2100 Gastro-esophageal reflux disease with esophagitis, without bleeding: Secondary | ICD-10-CM | POA: Insufficient documentation

## 2023-11-17 DIAGNOSIS — J479 Bronchiectasis, uncomplicated: Secondary | ICD-10-CM | POA: Insufficient documentation

## 2023-11-17 DIAGNOSIS — Z8669 Personal history of other diseases of the nervous system and sense organs: Secondary | ICD-10-CM | POA: Diagnosis present

## 2023-11-17 DIAGNOSIS — K222 Esophageal obstruction: Secondary | ICD-10-CM | POA: Diagnosis present

## 2023-11-17 DIAGNOSIS — E038 Other specified hypothyroidism: Secondary | ICD-10-CM | POA: Diagnosis present

## 2023-11-17 DIAGNOSIS — I517 Cardiomegaly: Secondary | ICD-10-CM | POA: Diagnosis present

## 2023-11-17 DIAGNOSIS — I7300 Raynaud's syndrome without gangrene: Secondary | ICD-10-CM | POA: Insufficient documentation

## 2023-11-17 DIAGNOSIS — I10 Essential (primary) hypertension: Secondary | ICD-10-CM | POA: Diagnosis not present

## 2023-11-17 DIAGNOSIS — R778 Other specified abnormalities of plasma proteins: Secondary | ICD-10-CM | POA: Diagnosis not present

## 2023-11-17 DIAGNOSIS — M19041 Primary osteoarthritis, right hand: Secondary | ICD-10-CM | POA: Diagnosis not present

## 2023-11-17 DIAGNOSIS — M19042 Primary osteoarthritis, left hand: Secondary | ICD-10-CM | POA: Diagnosis present

## 2023-11-17 DIAGNOSIS — M0609 Rheumatoid arthritis without rheumatoid factor, multiple sites: Secondary | ICD-10-CM | POA: Insufficient documentation

## 2023-11-17 DIAGNOSIS — L719 Rosacea, unspecified: Secondary | ICD-10-CM | POA: Diagnosis present

## 2023-11-17 DIAGNOSIS — Z8739 Personal history of other diseases of the musculoskeletal system and connective tissue: Secondary | ICD-10-CM | POA: Insufficient documentation

## 2023-11-17 DIAGNOSIS — M5030 Other cervical disc degeneration, unspecified cervical region: Secondary | ICD-10-CM | POA: Diagnosis not present

## 2023-11-17 DIAGNOSIS — S62102S Fracture of unspecified carpal bone, left wrist, sequela: Secondary | ICD-10-CM | POA: Diagnosis not present

## 2023-11-17 DIAGNOSIS — M79641 Pain in right hand: Secondary | ICD-10-CM

## 2023-11-17 DIAGNOSIS — M19071 Primary osteoarthritis, right ankle and foot: Secondary | ICD-10-CM | POA: Diagnosis not present

## 2023-11-17 DIAGNOSIS — J849 Interstitial pulmonary disease, unspecified: Secondary | ICD-10-CM | POA: Insufficient documentation

## 2023-11-17 NOTE — Patient Instructions (Signed)
 Standing Labs We placed an order today for your standing lab work.   Please have your standing labs drawn in December and every 3 months   Please have your labs drawn 2 weeks prior to your appointment so that the provider can discuss your lab results at your appointment, if possible.  Please note that you may see your imaging and lab results in MyChart before we have reviewed them. We will contact you once all results are reviewed. Please allow our office up to 72 hours to thoroughly review all of the results before contacting the office for clarification of your results.  WALK-IN LAB HOURS  Monday through Thursday from 8:00 am -12:30 pm and 1:00 pm-4:30 pm and Friday from 8:00 am-12:00 pm.  Patients with office visits requiring labs will be seen before walk-in labs.  You may encounter longer than normal wait times. Please allow additional time. Wait times may be shorter on  Monday and Thursday afternoons.  We do not book appointments for walk-in labs. We appreciate your patience and understanding with our staff.   Labs are drawn by [facility]. Please bring your co-pay at the time of your lab draw.  You may receive a bill from [facility] for your lab work.  Please note if you are on Hydroxychloroquine and and an order has been placed for a Hydroxychloroquine level,  you will need to have it drawn 4 hours or more after your last dose.  If you wish to have your labs drawn at another location, please call the office 24 hours in advance so we can fax the orders.  The office is located at [address], [address], [place], [place] [address]   If you have any questions regarding directions or hours of operation,  please call [phone].   As a reminder, please drink plenty of water prior to coming for your lab work. Thanks!

## 2023-11-18 NOTE — Telephone Encounter (Signed)
 Patient scheduled for visit with Caitlin next week

## 2023-11-23 NOTE — Progress Notes (Unsigned)
 Cardiology Office Note:    Date:  11/24/2023   ID:  DEMI TRIEU, DOB 06/17/1945, MRN [id number]  PCP:  Micheal Wolm LELON, MD   [facility] Providers Cardiologist:  Annabella Scarce, MD     Referring MD: Micheal Wolm LELON, MD   Chief complaint: Annual follow-up of hypertension and palpitations     History of Present Illness:   Christy Moore is a 78 y.o. female with a hx of cardiomegaly, HTN, Sjogren's disease, GERD presenting today for annual follow-up of hypertension and palpitations.   BP has been well-controlled on amlodipine  previously.  EKG in 2022 showed LVH, echo was ordered to rule out cardiomegaly. Echo 05/22/2020 showed LVEF 50-55%, low normal function, no RWMA, G1 DD, normal RV, mild MV regurg, mild AV sclerosis, no evidence of stenosis, RA pressure 3 mmHg.  In 2023 she complained of a fast heart rate, ZIO monitor was ordered.  Monitor showing predominantly sinus rhythm, average heart rate 86 bpm, occasional PACs, no arrhythmias.  Seen last in November 2023 by Reche Finder, NP, was doing well at that time.  HTN and Raynaud's were well-controlled on amlodipine .   Presents independently, appears stable from a cardiovascular standpoint.  Denies chest pain, palpitations, dizziness, near-syncope, dark/tarry/bloody stools, hematuria, weight gain, edema.  States she was diagnosed with pneumonia last year, states she has been mildly DOE since then that never fully resolved, followed by pulmonology.  Was diagnosed with Sjogren's disease earlier this year, started on methotrexate , has noticed fatigue, decreased appetite, occasional fast heart rate since then.  Reports occasional lightheadedness when bending over in the morning to feed her animals, improves after correcting position/drinking coffee. Reports daytime sleepiness, needs to take a nap in the afternoon after eating, usually feels better following her nap.  Says she had a sleep study done in the remote past,  unsure of the result, states it was performed over 10 years ago.  ROS:   Please see the history of present illness.    All other systems reviewed and are negative.     Past Medical History:  Diagnosis Date   Arthritis    Cardiomegaly    Chronic pneumonia    Depression    GERD (gastroesophageal reflux disease)    Hypertension    Hypothyroidism    Raynaud disease    Seizures (HCC)    Sinus tachycardia    Sjogren's syndrome    Status post dilation of esophageal narrowing 2012   Dr. Luis    Past Surgical History:  Procedure Laterality Date   BASAL CELL CARCINOMA EXCISION  2024   CATARACT EXTRACTION, BILATERAL  2024   HAND TENDON SURGERY  02/09/2007   tendon transfer-Dr. Camella   NASAL SEPTUM SURGERY     operated on by Dr. Floy 20 years ago   OVARIAN CYST REMOVAL     Right Sided Salpingo-oopherectomy   SALPINGOOPHORECTOMY     WISDOM TOOTH EXTRACTION      Current Medications: Current Meds  Medication Sig   acetaminophen  (TYLENOL ) 500 MG tablet Take 500 mg by mouth as needed.   albuterol  (VENTOLIN  HFA) 108 (90 Base) MCG/ACT inhaler Inhale 1-2 puffs into the lungs every 4 (four) hours as needed for wheezing or shortness of breath.   amLODipine  (NORVASC ) 5 MG tablet TAKE 1 TABLET (5 MG TOTAL) BY MOUTH DAILY.   cyclobenzaprine  (FLEXERIL ) 10 MG tablet Take 1 tablet (10 mg total) by mouth 3 (three) times daily as needed for muscle spasms.   famotidine  (PEPCID )  40 MG tablet TAKE 1 TABLET BY MOUTH EVERYDAY AT BEDTIME   fluticasone  (FLONASE ) 50 MCG/ACT nasal spray Place 2 sprays into both nostrils daily as needed for allergies.   folic acid  (FOLVITE ) 1 MG tablet Take 2 tablets (2 mg total) by mouth daily.   levothyroxine  (SYNTHROID ) 100 MCG tablet TAKE 1 TABLET BY MOUTH EVERY DAY   methotrexate  (RHEUMATREX) 2.5 MG tablet 6 TABLETS BY MOUTH ONCE A WEEK     Allergies:   Albuterol -budesonide, Amoxicillin -pot clavulanate, Azithromycin , Broccoli [brassica oleracea], Celecoxib,  Ciprofloxacin, Doxycycline , Erythromycin, Hydrocodone, Hydrocodone-acetaminophen , Levofloxacin, Omeprazole , Penicillins, Propoxyphene n-acetaminophen , Rofecoxib, Sulfa antibiotics, and Tetanus toxoid   Social History   Socioeconomic History   Marital status: Married    Spouse name: Not on file   Number of children: 2   Years of education: Not on file   Highest education level: Some college, no degree  Occupational History   Occupation: retired  Tobacco Use   Smoking status: Never    Passive exposure: Past   Smokeless tobacco: Never  Vaping Use   Vaping status: Never Used  Substance and Sexual Activity   Alcohol use: Not Currently    Comment: rarely   Drug use: No   Sexual activity: Not on file  Other Topics Concern   Not on file  Social History Narrative   Not on file   Social Drivers of Health   Financial Resource Strain: Low Risk  (11/19/2022)   Overall Financial Resource Strain (CARDIA)    Difficulty of Paying Living Expenses: Not hard at all  Food Insecurity: No Food Insecurity (08/27/2023)   Hunger Vital Sign    Worried About Running Out of Food in the Last Year: Never true    Ran Out of Food in the Last Year: Never true  Transportation Needs: No Transportation Needs (08/27/2023)   PRAPARE - Administrator, Civil Service (Medical): No    Lack of Transportation (Non-Medical): No  Physical Activity: Insufficiently Active (11/19/2022)   Exercise Vital Sign    Days of Exercise per Week: 2 days    Minutes of Exercise per Session: 20 min  Stress: No Stress Concern Present (11/19/2022)   [employer] of Occupational Health - Occupational Stress Questionnaire    Feeling of Stress : Not at all  Social Connections: Socially Integrated (11/19/2022)   Social Connection and Isolation Panel    Frequency of Communication with Friends and Family: More than three times a week    Frequency of Social Gatherings with Friends and Family: Twice a week    Attends  Religious Services: More than 4 times per year    Active Member of [employer] or Organizations: Yes    Attends Engineer, Structural: More than 4 times per year    Marital Status: Married     Family History: The patient's family history includes Alcohol abuse in her maternal grandfather and maternal grandmother; Allergies in an other family member; Cancer in her father and mother; Heart attack (age of onset: 64) in her father; Hypothyroidism in her mother; Stroke (age of onset: 45) in her mother. There is no history of Colon cancer, Esophageal cancer, Stomach cancer, or Rectal cancer.  EKGs/Labs/Other Studies Reviewed:    The following studies were reviewed today:  EKG Interpretation Date/Time:  Tuesday November 24 2023 15:14:32 EST Ventricular Rate:  102 PR Interval:  160 QRS Duration:  116 QT Interval:  364 QTC Calculation: 474 R Axis:   -74  Text Interpretation: Sinus tachycardia Possible  Left atrial enlargement Left anterior fascicular block Left ventricular hypertrophy with QRS widening and repolarization abnormality ( R in aVL , Cornell product , Romhilt-Estes ) Cannot rule out Septal infarct (cited on or before 06-Mar-2023) No significant change from prior study Confirmed by Noura Purpura [phone]) on 11/24/2023 3:40:34 PM    Recent Labs: 09/15/2023: ALT 13; BUN 13; Creat 0.89; Hemoglobin 13.9; Platelets 216; Potassium 4.6; Sodium 139  Recent Lipid Panel    Component Value Date/Time   CHOL 163 02/14/2015 0936   TRIG 106.0 02/14/2015 0936   HDL 52.70 02/14/2015 0936   CHOLHDL 3 02/14/2015 0936   VLDL 21.2 02/14/2015 0936   LDLCALC 89 02/14/2015 0936      STOP-Bang Score:  4       Physical Exam:    VS:  BP 128/64   Pulse (!) 102   Ht 5' 4.5 (1.638 m)   Wt 151 lb 9.6 oz (68.8 kg)   SpO2 97%   BMI 25.62 kg/m        Wt Readings from Last 3 Encounters:  11/24/23 151 lb 9.6 oz (68.8 kg)  11/17/23 141 lb 3.2 oz (64 kg)  09/30/23 147 lb (66.7 kg)     GEN:   Well nourished, well developed in no acute distress HEENT: Normal NECK: No carotid bruits CARDIAC:  S1-S2 normal, RRR with occasional irregularities/tachycardic, no murmurs, rubs, gallops RESPIRATORY:  Clear to auscultation without rales, wheezing or rhonchi  MUSCULOSKELETAL:  No edema; No deformity  SKIN: Warm and dry NEUROLOGIC:  Alert and oriented x 3 PSYCHIATRIC:  Normal affect       Assessment & Plan Sinus tachycardia Dyspnea on exertion Fatigue, unspecified type EKG: Sinus tachycardia, 102 bpm, possible left atrial enlargement, left anterior fascicular block, left ventricular hypertrophy with QRS widening and repolarization abnormality, cannot rule out septal infarct, old.  No significant change since prior study. Echo 05/22/2020: LVEF 50-55%, low normal function, no RWMA, G1 DD, normal RV, mild MV regurg, mild AV sclerosis Reports increased fatigue, elevated heart rate at home that is new over the last few months. DOE over the last year since pneumonia diagnosis, unchanged.  O2 sat 97% today on RA. Denies SOB at rest, orthopnea, near-syncope. Will plan to repeat echo to evaluate for cardiomyopathies, LV function, and structural abnormalities Observed skipped beats/irregularities in heart rhythm on physical exam, EKG without PVCs today.  Will order 1 week ZIO monitor to rule out arrhythmias/ectopy burden. Will order lab work to rule out electrolyte abnormalities, evaluate kidney function and thyroid  function Primary hypertension Patient reports well-controlled on amlodipine  Managed by PCP Hypothyroidism, unspecified type Takes levothyroxine  for hypothyroidism, TSH has not been checked in last year per my records Will recheck today Daytime sleepiness STOP-BANG score = 4 Reports remote sleep study in the past,  unable to find documentation of this Patient unsure of ultimate result, does not recall being previously diagnosed with OSA Will reach out to pulmonology to see how they  would like to proceed with further sleep study evaluation to rule out OSA.  Plan to follow-up in 6 weeks following results of testing        Medication Adjustments/Labs and Tests Ordered: Current medicines are reviewed at length with the patient today.  Concerns regarding medicines are outlined above.  Orders Placed This Encounter  Procedures   CBC   Comprehensive metabolic panel with GFR   TSH   Magnesium    LONG TERM MONITOR (3-14 DAYS)   EKG 12-Lead   ECHOCARDIOGRAM COMPLETE  No orders of the defined types were placed in this encounter.   Patient Instructions  Medication Instructions:  Continue your current medications.   *If you need a refill on your cardiac medications before your next appointment, please call your pharmacy*  Lab Work: Your physician recommends that you return for lab work today: TSH, CMP, CBC, magnesium   If you have labs (blood work) drawn today and your tests are completely normal, you will receive your results only by: MyChart Message (if you have MyChart) OR A paper copy in the mail If you have any lab test that is abnormal or we need to change your treatment, we will call you to review the results.  Testing/Procedures:  Your physician has requested that you have an echocardiogram. Echocardiography is a painless test that uses sound waves to create images of your heart. It provides your doctor with information about the size and shape of your heart and how well your heart's chambers and valves are working. This procedure takes approximately one hour. There are no restrictions for this procedure. Please do NOT wear cologne, perfume, aftershave, or lotions (deodorant is allowed). Please arrive 15 minutes prior to your appointment time.  Please note: We ask at that you not bring children with you during ultrasound (echo/ vascular) testing. Due to room size and safety concerns, children are not allowed in the ultrasound rooms during exams. Our front  office staff cannot provide observation of children in our lobby area while testing is being conducted. An adult accompanying a patient to their appointment will only be allowed in the ultrasound room at the discretion of the ultrasound technician under special circumstances. We apologize for any inconvenience.   Your physician has recommended that you wear a Zio monitor.   This monitor is a medical device that records the heart's electrical activity. Doctors most often use these monitors to diagnose arrhythmias. Arrhythmias are problems with the speed or rhythm of the heartbeat. The monitor is a small device applied to your chest. You can wear one while you do your normal daily activities. While wearing this monitor if you have any symptoms to push the button and record what you felt. Once you have worn this monitor for the period of time provider prescribed (Usually 14 days), you will return the monitor device in the postage paid box. Once it is returned they will download the data collected and provide us  with a report which the provider will then review and we will call you with those results. Important tips:  Avoid showering during the first 24 hours of wearing the monitor. Avoid excessive sweating to help maximize wear time. Do not submerge the device, no hot tubs, and no swimming pools. Keep any lotions or oils away from the patch. After 24 hours you may shower with the patch on. Take brief showers with your back facing the shower head.  Do not remove patch once it has been placed because that will interrupt data and decrease adhesive wear time. Push the button when you have any symptoms and write down what you were feeling. Once you have completed wearing your monitor, remove and place into box which has postage paid and place in your outgoing mailbox.  If for some reason you have misplaced your box then call our office and we can provide another box and/or mail it off for  you.  Follow-Up: At [facility], you and your health needs are our priority.  As part of our continuing mission to provide  you with exceptional heart care, our providers are all part of one team.  This team includes your primary Cardiologist (physician) and Advanced Practice Providers or APPs (Physician Assistants and Nurse Practitioners) who all work together to provide you with the care you need, when you need it.  Your next appointment:   6 week(s)  Provider:   Annabella Scarce, MD, Rosaline Bane, NP, or Reche Finder, NP    We recommend signing up for the patient portal called MyChart.  Sign up information is provided on this After Visit Summary.  MyChart is used to connect with patients for Virtual Visits (Telemedicine).  Patients are able to view lab/test results, encounter notes, upcoming appointments, etc.  Non-urgent messages can be sent to your provider as well.   To learn more about what you can do with MyChart, go to [email or web address].   Other Instructions  We will reach out to Dr. Kara about getting a sleep study set up.    Signed, Miriam FORBES Shams, NP  11/24/2023 4:26 PM    [facility]

## 2023-11-24 ENCOUNTER — Encounter (HOSPITAL_BASED_OUTPATIENT_CLINIC_OR_DEPARTMENT_OTHER): Payer: Self-pay | Admitting: Emergency Medicine

## 2023-11-24 ENCOUNTER — Ambulatory Visit: Attending: Emergency Medicine

## 2023-11-24 ENCOUNTER — Ambulatory Visit (INDEPENDENT_AMBULATORY_CARE_PROVIDER_SITE_OTHER): Admitting: Emergency Medicine

## 2023-11-24 VITALS — BP 128/64 | HR 102 | Ht 64.5 in | Wt 151.6 lb

## 2023-11-24 DIAGNOSIS — R000 Tachycardia, unspecified: Secondary | ICD-10-CM

## 2023-11-24 DIAGNOSIS — R0609 Other forms of dyspnea: Secondary | ICD-10-CM

## 2023-11-24 DIAGNOSIS — R5383 Other fatigue: Secondary | ICD-10-CM | POA: Diagnosis not present

## 2023-11-24 DIAGNOSIS — I10 Essential (primary) hypertension: Secondary | ICD-10-CM | POA: Diagnosis not present

## 2023-11-24 DIAGNOSIS — E039 Hypothyroidism, unspecified: Secondary | ICD-10-CM

## 2023-11-24 DIAGNOSIS — R400 Somnolence: Secondary | ICD-10-CM | POA: Diagnosis not present

## 2023-11-24 NOTE — Progress Notes (Unsigned)
Enrolled for [employer] to mail a ZIO XT long term holter monitor to the patients address on file.   Dr. Oval Linsey to read.

## 2023-11-24 NOTE — Assessment & Plan Note (Signed)
 Takes levothyroxine  for hypothyroidism, TSH has not been checked in last year per my records Will recheck today

## 2023-11-24 NOTE — Patient Instructions (Addendum)
 Medication Instructions:  Continue your current medications.   *If you need a refill on your cardiac medications before your next appointment, please call your pharmacy*  Lab Work: Your physician recommends that you return for lab work today: TSH, CMP, CBC, magnesium   If you have labs (blood work) drawn today and your tests are completely normal, you will receive your results only by: MyChart Message (if you have MyChart) OR A paper copy in the mail If you have any lab test that is abnormal or we need to change your treatment, we will call you to review the results.  Testing/Procedures:  Your physician has requested that you have an echocardiogram. Echocardiography is a painless test that uses sound waves to create images of your heart. It provides your doctor with information about the size and shape of your heart and how well your heart's chambers and valves are working. This procedure takes approximately one hour. There are no restrictions for this procedure. Please do NOT wear cologne, perfume, aftershave, or lotions (deodorant is allowed). Please arrive 15 minutes prior to your appointment time.  Please note: We ask at that you not bring children with you during ultrasound (echo/ vascular) testing. Due to room size and safety concerns, children are not allowed in the ultrasound rooms during exams. Our front office staff cannot provide observation of children in our lobby area while testing is being conducted. An adult accompanying a patient to their appointment will only be allowed in the ultrasound room at the discretion of the ultrasound technician under special circumstances. We apologize for any inconvenience.   Your physician has recommended that you wear a Zio monitor.   This monitor is a medical device that records the heart's electrical activity. Doctors most often use these monitors to diagnose arrhythmias. Arrhythmias are problems with the speed or rhythm of the heartbeat. The  monitor is a small device applied to your chest. You can wear one while you do your normal daily activities. While wearing this monitor if you have any symptoms to push the button and record what you felt. Once you have worn this monitor for the period of time provider prescribed (Usually 14 days), you will return the monitor device in the postage paid box. Once it is returned they will download the data collected and provide us  with a report which the provider will then review and we will call you with those results. Important tips:  Avoid showering during the first 24 hours of wearing the monitor. Avoid excessive sweating to help maximize wear time. Do not submerge the device, no hot tubs, and no swimming pools. Keep any lotions or oils away from the patch. After 24 hours you may shower with the patch on. Take brief showers with your back facing the shower head.  Do not remove patch once it has been placed because that will interrupt data and decrease adhesive wear time. Push the button when you have any symptoms and write down what you were feeling. Once you have completed wearing your monitor, remove and place into box which has postage paid and place in your outgoing mailbox.  If for some reason you have misplaced your box then call our office and we can provide another box and/or mail it off for you.  Follow-Up: At [facility], you and your health needs are our priority.  As part of our continuing mission to provide you with exceptional heart care, our providers are all part of one team.  This team includes  your primary Cardiologist (physician) and Advanced Practice Providers or APPs (Physician Assistants and Nurse Practitioners) who all work together to provide you with the care you need, when you need it.  Your next appointment:   6 week(s)  Provider:   Annabella Scarce, MD, Rosaline Bane, NP, or Reche Finder, NP    We recommend signing up for the patient portal called  MyChart.  Sign up information is provided on this After Visit Summary.  MyChart is used to connect with patients for Virtual Visits (Telemedicine).  Patients are able to view lab/test results, encounter notes, upcoming appointments, etc.  Non-urgent messages can be sent to your provider as well.   To learn more about what you can do with MyChart, go to [email or web address].   Other Instructions  We will reach out to Dr. Kara about getting a sleep study set up.

## 2023-11-24 NOTE — Assessment & Plan Note (Signed)
 Patient reports well-controlled on amlodipine  Managed by PCP

## 2023-11-25 ENCOUNTER — Encounter (HOSPITAL_BASED_OUTPATIENT_CLINIC_OR_DEPARTMENT_OTHER): Payer: Self-pay | Admitting: Cardiovascular Disease

## 2023-11-25 DIAGNOSIS — R0609 Other forms of dyspnea: Secondary | ICD-10-CM | POA: Diagnosis not present

## 2023-11-25 DIAGNOSIS — R000 Tachycardia, unspecified: Secondary | ICD-10-CM | POA: Diagnosis not present

## 2023-11-25 DIAGNOSIS — E039 Hypothyroidism, unspecified: Secondary | ICD-10-CM | POA: Diagnosis not present

## 2023-11-26 ENCOUNTER — Ambulatory Visit: Payer: Self-pay | Admitting: Emergency Medicine

## 2023-11-26 LAB — COMPREHENSIVE METABOLIC PANEL WITH GFR
ALT: 6 IU/L (ref 0–32)
AST: 20 IU/L (ref 0–40)
Albumin: 3.9 g/dL (ref 3.8–4.8)
Alkaline Phosphatase: 75 IU/L (ref 49–135)
BUN/Creatinine Ratio: 12 (ref 12–28)
BUN: 12 mg/dL (ref 8–27)
Bilirubin Total: 0.6 mg/dL (ref 0.0–1.2)
CO2: 23 mmol/L (ref 20–29)
Calcium: 9.4 mg/dL (ref 8.7–10.3)
Chloride: 96 mmol/L (ref 96–106)
Creatinine, Ser: 0.99 mg/dL (ref 0.57–1.00)
Globulin, Total: 3.2 g/dL (ref 1.5–4.5)
Glucose: 144 mg/dL — ABNORMAL HIGH (ref 70–99)
Potassium: 4.7 mmol/L (ref 3.5–5.2)
Sodium: 136 mmol/L (ref 134–144)
Total Protein: 7.1 g/dL (ref 6.0–8.5)
eGFR: 59 mL/min/1.73 — ABNORMAL LOW (ref >59)

## 2023-11-26 LAB — CBC
Hematocrit: 38.9 % (ref 34.0–46.6)
Hemoglobin: 12.8 g/dL (ref 11.1–15.9)
MCH: 31.4 pg (ref 26.6–33.0)
MCHC: 32.9 g/dL (ref 31.5–35.7)
MCV: 96 fL (ref 79–97)
Platelets: 251 x10E3/uL (ref 150–450)
RBC: 4.07 x10E6/uL (ref 3.77–5.28)
RDW: 13.5 % (ref 11.7–15.4)
WBC: 8.3 x10E3/uL (ref 3.4–10.8)

## 2023-11-26 LAB — MAGNESIUM: Magnesium: 2.2 mg/dL (ref 1.6–2.3)

## 2023-11-26 LAB — TSH: TSH: 0.687 u[IU]/mL (ref 0.450–4.500)

## 2023-11-27 ENCOUNTER — Telehealth: Payer: Self-pay | Admitting: Cardiovascular Disease

## 2023-11-27 ENCOUNTER — Other Ambulatory Visit: Payer: Self-pay

## 2023-11-27 DIAGNOSIS — J471 Bronchiectasis with (acute) exacerbation: Secondary | ICD-10-CM

## 2023-11-27 DIAGNOSIS — R1319 Other dysphagia: Secondary | ICD-10-CM

## 2023-11-27 DIAGNOSIS — M3502 Sicca syndrome with lung involvement: Secondary | ICD-10-CM

## 2023-11-27 NOTE — Telephone Encounter (Signed)
 Patient was calling to let our office know that she wouldn't get her heart monitor till Monday. Please advise

## 2023-11-27 NOTE — Telephone Encounter (Signed)
 Returned call to patient and let her know it's fine regarding monitor.  Also let her know [facility] supervisor should be calling them and to let me know if she does not hear from someone.

## 2023-11-30 ENCOUNTER — Other Ambulatory Visit: Payer: Self-pay | Admitting: Physician Assistant

## 2023-11-30 NOTE — Telephone Encounter (Signed)
 Last Fill: 09/14/2023  Labs: 11/25/2023 Glucose 144, GFR 59  Next Visit: 04/19/2024  Last Visit: 11/17/2023  DX: Rheumatoid arthritis of multiple sites with negative rheumatoid factor   Current Dose per office note 11/17/2023: Methotrexate  5 tablets by mouth once weekly   Okay to refill Methotrexate ?

## 2023-12-01 ENCOUNTER — Encounter: Payer: Self-pay | Admitting: Family Medicine

## 2023-12-01 ENCOUNTER — Ambulatory Visit (INDEPENDENT_AMBULATORY_CARE_PROVIDER_SITE_OTHER): Admitting: Family Medicine

## 2023-12-01 VITALS — BP 124/70 | HR 109 | Temp 98.4°F | Wt 148.0 lb

## 2023-12-01 DIAGNOSIS — M62838 Other muscle spasm: Secondary | ICD-10-CM | POA: Diagnosis not present

## 2023-12-01 DIAGNOSIS — J029 Acute pharyngitis, unspecified: Secondary | ICD-10-CM

## 2023-12-01 DIAGNOSIS — J329 Chronic sinusitis, unspecified: Secondary | ICD-10-CM

## 2023-12-01 MED ORDER — PREDNISONE 5 MG PO TABS: ORAL_TABLET | ORAL | 0 refills | Status: DC

## 2023-12-01 NOTE — Progress Notes (Signed)
 Established Patient Office Visit  Subjective   Patient ID: Christy Moore, female    DOB: [birth year]  Age: 78 y.o. MRN: [id number]  Chief Complaint  Patient presents with   Sore Throat    HPI   Christy Moore is seen today with approximate 2-week history of postnasal drainage and sore throat.  She states her sore throat symptoms are actually somewhat diminished today.  Denies any recent headache or fever.  She has had reddish-brown and occasional yellow-tinged mucus frequently.  Has been doing salt water gargles and taking Tylenol  which may help some.  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  Denies any facial pain currently.  She is also relating some back muscle spasms specially thoracic area.  She has taken Flexeril  but has significant sedation with that.  In the past, has taken very low-dose prednisone  of 5 mg daily which has helped with similar pain.  Requesting refill.  No history of diabetes.  Past Medical History:  Diagnosis Date   Arthritis    Cardiomegaly    Chronic pneumonia    Depression    GERD (gastroesophageal reflux disease)    Hypertension    Hypothyroidism    Raynaud disease    Seizures (HCC)    Sinus tachycardia    Sjogren's syndrome    Status post dilation of esophageal narrowing 2012   Dr. Luis   Past Surgical History:  Procedure Laterality Date   BASAL CELL CARCINOMA EXCISION  2024   CATARACT EXTRACTION, BILATERAL  2024   HAND TENDON SURGERY  02/09/2007   tendon transfer-Dr. Camella   NASAL SEPTUM SURGERY     operated on by Dr. Floy 20 years ago   OVARIAN CYST REMOVAL     Right Sided Salpingo-oopherectomy   SALPINGOOPHORECTOMY     WISDOM TOOTH EXTRACTION      reports that she has never smoked. She has been exposed to tobacco smoke. She has never used smokeless tobacco. She reports that she does not currently use  alcohol. She reports that she does not use drugs. family history includes Alcohol abuse in her maternal grandfather and maternal grandmother; Allergies in an other family member; Cancer in her father and mother; Heart attack (age of onset: 45) in her father; Hypothyroidism in her mother; Stroke (age of onset: 61) in her mother. Allergies  Allergen Reactions   Albuterol -Budesonide Other (See Comments)    Burned her tongue  albuterol  / budesonide   Amoxicillin -Pot Clavulanate     REACTION: nausea   Azithromycin      REACTION: nausea, Gi  Other Reaction(s): Unknown   Broccoli [Brassica Oleracea]    Celecoxib Other (See Comments)    REACTION: nausea  celecoxib   Ciprofloxacin Other (See Comments)    REACTION: nausea  ciprofloxacin   Doxycycline  Other (See Comments)    REACTION: gi upset   Erythromycin Other (See Comments)    REACTION: nause, GI   Hydrocodone Other (See Comments)    REACTION: nausea   Hydrocodone-Acetaminophen      REACTION: GI upset   Levofloxacin Other (See Comments)    REACTION: she claims renal failiure  levofloxacin   Omeprazole  Nausea And Vomiting and Other (See Comments)    omeprazole    Penicillins    Propoxyphene N-Acetaminophen     Rofecoxib     REACTION: nausea   Sulfa Antibiotics    Tetanus Toxoid     Review  of Systems  Constitutional:  Negative for chills and fever.  HENT:  Positive for congestion and sore throat.   Respiratory:  Negative for cough and shortness of breath.   Cardiovascular:  Negative for chest pain.  Musculoskeletal:  Positive for back pain.      Objective:     BP 124/70   Pulse (!) 109   Temp 98.4 F (36.9 C) (Oral)   Wt 148 lb (67.1 kg)   SpO2 95%   BMI 25.01 kg/m  BP Readings from Last 3 Encounters:  12/01/23 124/70  11/24/23 128/64  11/17/23 119/75   Wt Readings from Last 3 Encounters:  12/01/23 148 lb (67.1 kg)  11/24/23 151 lb 9.6 oz (68.8 kg)  11/17/23 141 lb 3.2 oz (64 kg)      Physical  Exam Vitals reviewed.  Constitutional:      General: She is not in acute distress.    Appearance: She is not ill-appearing.  HENT:     Right Ear: Tympanic membrane normal.     Left Ear: Tympanic membrane normal.     Mouth/Throat:     Comments: Has some thick yellow-tinged mucus posterior pharynx.  Does have some mild erythema posterior pharynx.  No exudate. Cardiovascular:     Rate and Rhythm: Normal rate and regular rhythm.  Musculoskeletal:     Cervical back: Neck supple.  Lymphadenopathy:     Cervical: No cervical adenopathy.  Neurological:     Mental Status: She is alert.      No results found for any visits on 12/01/23.    The ASCVD Risk score (Arnett DK, et al., 2019) failed to calculate for the following reasons:   Cannot find a previous HDL lab   Cannot find a previous total cholesterol lab    Assessment & Plan:   #1 intermittent sore throat and postnasal drainage.  Has had 2 weeks of some thick postnasal drainage.  Symptoms actually slightly improved today.  We recommend he continue conservative therapy with salt water gargles, good hydration, Mucinex 1200 mg twice daily.  Symptoms slightly improved and recommend observe without antibiotics at this time but then touch by next week if not further improved  #2 intermittent muscle spasms thoracic back.  She has benefited from low-dose prednisone  in the past.  Requesting same.  We agreed to low-dose prednisone  5 mg once daily for 10 days.  Continue Flexeril  at night as needed.  Be in touch for any persistent symptoms   No follow-ups on file.    Wolm Scarlet, MD

## 2023-12-01 NOTE — Patient Instructions (Signed)
 Consider Mucinex 1,200 mg twice daily  Continue with daily salt water gargles.    Let me know if no better next week.

## 2023-12-09 ENCOUNTER — Other Ambulatory Visit: Payer: Self-pay | Admitting: Family Medicine

## 2023-12-14 DIAGNOSIS — R000 Tachycardia, unspecified: Secondary | ICD-10-CM | POA: Diagnosis not present

## 2023-12-15 ENCOUNTER — Other Ambulatory Visit: Payer: Self-pay

## 2023-12-15 ENCOUNTER — Emergency Department (HOSPITAL_BASED_OUTPATIENT_CLINIC_OR_DEPARTMENT_OTHER): Admitting: Radiology

## 2023-12-15 ENCOUNTER — Inpatient Hospital Stay (HOSPITAL_BASED_OUTPATIENT_CLINIC_OR_DEPARTMENT_OTHER)
Admission: EM | Admit: 2023-12-15 | Discharge: 2023-12-18 | DRG: 871 | Disposition: A | Discharge status: Home / Self Care | Attending: Internal Medicine | Admitting: Internal Medicine

## 2023-12-15 ENCOUNTER — Emergency Department (HOSPITAL_BASED_OUTPATIENT_CLINIC_OR_DEPARTMENT_OTHER)

## 2023-12-15 DIAGNOSIS — J9811 Atelectasis: Secondary | ICD-10-CM | POA: Diagnosis present

## 2023-12-15 DIAGNOSIS — J189 Pneumonia, unspecified organism: Secondary | ICD-10-CM | POA: Diagnosis present

## 2023-12-15 DIAGNOSIS — R5381 Other malaise: Secondary | ICD-10-CM | POA: Diagnosis present

## 2023-12-15 DIAGNOSIS — Z8249 Family history of ischemic heart disease and other diseases of the circulatory system: Secondary | ICD-10-CM

## 2023-12-15 DIAGNOSIS — E872 Acidosis, unspecified: Secondary | ICD-10-CM | POA: Diagnosis present

## 2023-12-15 DIAGNOSIS — K2100 Gastro-esophageal reflux disease with esophagitis, without bleeding: Secondary | ICD-10-CM | POA: Diagnosis present

## 2023-12-15 DIAGNOSIS — R6510 Systemic inflammatory response syndrome (SIRS) of non-infectious origin without acute organ dysfunction: Secondary | ICD-10-CM | POA: Diagnosis not present

## 2023-12-15 DIAGNOSIS — R1314 Dysphagia, pharyngoesophageal phase: Secondary | ICD-10-CM | POA: Diagnosis present

## 2023-12-15 DIAGNOSIS — Z79899 Other long term (current) drug therapy: Secondary | ICD-10-CM

## 2023-12-15 DIAGNOSIS — I4892 Unspecified atrial flutter: Secondary | ICD-10-CM | POA: Diagnosis not present

## 2023-12-15 DIAGNOSIS — Z887 Allergy status to serum and vaccine status: Secondary | ICD-10-CM

## 2023-12-15 DIAGNOSIS — J984 Other disorders of lung: Secondary | ICD-10-CM | POA: Diagnosis present

## 2023-12-15 DIAGNOSIS — I452 Bifascicular block: Secondary | ICD-10-CM | POA: Diagnosis present

## 2023-12-15 DIAGNOSIS — Z79890 Hormone replacement therapy (postmenopausal): Secondary | ICD-10-CM

## 2023-12-15 DIAGNOSIS — E875 Hyperkalemia: Secondary | ICD-10-CM | POA: Diagnosis present

## 2023-12-15 DIAGNOSIS — I7300 Raynaud's syndrome without gangrene: Secondary | ICD-10-CM | POA: Diagnosis present

## 2023-12-15 DIAGNOSIS — Z9841 Cataract extraction status, right eye: Secondary | ICD-10-CM

## 2023-12-15 DIAGNOSIS — M3500 Sicca syndrome, unspecified: Secondary | ICD-10-CM | POA: Diagnosis present

## 2023-12-15 DIAGNOSIS — Z1152 Encounter for screening for COVID-19: Secondary | ICD-10-CM

## 2023-12-15 DIAGNOSIS — I700 Atherosclerosis of aorta: Secondary | ICD-10-CM | POA: Diagnosis not present

## 2023-12-15 DIAGNOSIS — Z91018 Allergy to other foods: Secondary | ICD-10-CM

## 2023-12-15 DIAGNOSIS — Z8042 Family history of malignant neoplasm of prostate: Secondary | ICD-10-CM

## 2023-12-15 DIAGNOSIS — Z85828 Personal history of other malignant neoplasm of skin: Secondary | ICD-10-CM

## 2023-12-15 DIAGNOSIS — R079 Chest pain, unspecified: Secondary | ICD-10-CM | POA: Diagnosis not present

## 2023-12-15 DIAGNOSIS — J471 Bronchiectasis with (acute) exacerbation: Secondary | ICD-10-CM | POA: Diagnosis present

## 2023-12-15 DIAGNOSIS — R0789 Other chest pain: Secondary | ICD-10-CM | POA: Diagnosis present

## 2023-12-15 DIAGNOSIS — Z885 Allergy status to narcotic agent status: Secondary | ICD-10-CM

## 2023-12-15 DIAGNOSIS — R0689 Other abnormalities of breathing: Secondary | ICD-10-CM | POA: Diagnosis not present

## 2023-12-15 DIAGNOSIS — R569 Unspecified convulsions: Secondary | ICD-10-CM | POA: Diagnosis present

## 2023-12-15 DIAGNOSIS — I10 Essential (primary) hypertension: Secondary | ICD-10-CM | POA: Diagnosis present

## 2023-12-15 DIAGNOSIS — A419 Sepsis, unspecified organism: Principal | ICD-10-CM | POA: Diagnosis present

## 2023-12-15 DIAGNOSIS — Z9889 Other specified postprocedural states: Secondary | ICD-10-CM

## 2023-12-15 DIAGNOSIS — K222 Esophageal obstruction: Secondary | ICD-10-CM | POA: Diagnosis present

## 2023-12-15 DIAGNOSIS — J9601 Acute respiratory failure with hypoxia: Secondary | ICD-10-CM

## 2023-12-15 DIAGNOSIS — Z881 Allergy status to other antibiotic agents status: Secondary | ICD-10-CM

## 2023-12-15 DIAGNOSIS — Z801 Family history of malignant neoplasm of trachea, bronchus and lung: Secondary | ICD-10-CM

## 2023-12-15 DIAGNOSIS — Z811 Family history of alcohol abuse and dependence: Secondary | ICD-10-CM

## 2023-12-15 DIAGNOSIS — F32A Depression, unspecified: Secondary | ICD-10-CM | POA: Diagnosis present

## 2023-12-15 DIAGNOSIS — J849 Interstitial pulmonary disease, unspecified: Secondary | ICD-10-CM | POA: Diagnosis present

## 2023-12-15 DIAGNOSIS — Z8701 Personal history of pneumonia (recurrent): Secondary | ICD-10-CM

## 2023-12-15 DIAGNOSIS — R918 Other nonspecific abnormal finding of lung field: Secondary | ICD-10-CM | POA: Diagnosis not present

## 2023-12-15 DIAGNOSIS — I4891 Unspecified atrial fibrillation: Secondary | ICD-10-CM | POA: Diagnosis not present

## 2023-12-15 DIAGNOSIS — Z823 Family history of stroke: Secondary | ICD-10-CM

## 2023-12-15 DIAGNOSIS — Z90721 Acquired absence of ovaries, unilateral: Secondary | ICD-10-CM

## 2023-12-15 DIAGNOSIS — R000 Tachycardia, unspecified: Secondary | ICD-10-CM | POA: Diagnosis not present

## 2023-12-15 DIAGNOSIS — Z888 Allergy status to other drugs, medicaments and biological substances status: Secondary | ICD-10-CM

## 2023-12-15 DIAGNOSIS — K429 Umbilical hernia without obstruction or gangrene: Secondary | ICD-10-CM | POA: Diagnosis not present

## 2023-12-15 DIAGNOSIS — I491 Atrial premature depolarization: Secondary | ICD-10-CM | POA: Diagnosis present

## 2023-12-15 DIAGNOSIS — R6520 Severe sepsis without septic shock: Principal | ICD-10-CM | POA: Diagnosis present

## 2023-12-15 DIAGNOSIS — I4901 Ventricular fibrillation: Secondary | ICD-10-CM | POA: Diagnosis not present

## 2023-12-15 DIAGNOSIS — Z882 Allergy status to sulfonamides status: Secondary | ICD-10-CM

## 2023-12-15 DIAGNOSIS — Z9842 Cataract extraction status, left eye: Secondary | ICD-10-CM

## 2023-12-15 DIAGNOSIS — M069 Rheumatoid arthritis, unspecified: Secondary | ICD-10-CM | POA: Diagnosis present

## 2023-12-15 DIAGNOSIS — I272 Pulmonary hypertension, unspecified: Secondary | ICD-10-CM | POA: Diagnosis present

## 2023-12-15 DIAGNOSIS — Z880 Allergy status to penicillin: Secondary | ICD-10-CM

## 2023-12-15 DIAGNOSIS — Z886 Allergy status to analgesic agent status: Secondary | ICD-10-CM

## 2023-12-15 DIAGNOSIS — M542 Cervicalgia: Secondary | ICD-10-CM | POA: Diagnosis present

## 2023-12-15 DIAGNOSIS — J90 Pleural effusion, not elsewhere classified: Secondary | ICD-10-CM | POA: Diagnosis present

## 2023-12-15 DIAGNOSIS — D472 Monoclonal gammopathy: Secondary | ICD-10-CM | POA: Diagnosis present

## 2023-12-15 DIAGNOSIS — Z803 Family history of malignant neoplasm of breast: Secondary | ICD-10-CM

## 2023-12-15 DIAGNOSIS — R110 Nausea: Secondary | ICD-10-CM | POA: Diagnosis not present

## 2023-12-15 DIAGNOSIS — M1990 Unspecified osteoarthritis, unspecified site: Secondary | ICD-10-CM | POA: Diagnosis present

## 2023-12-15 DIAGNOSIS — E039 Hypothyroidism, unspecified: Secondary | ICD-10-CM | POA: Diagnosis present

## 2023-12-15 HISTORY — DX: Acute respiratory failure with hypoxia: J96.01

## 2023-12-15 LAB — URINALYSIS, ROUTINE W REFLEX MICROSCOPIC
Bilirubin Urine: NEGATIVE
Glucose, UA: NEGATIVE mg/dL
Hgb urine dipstick: NEGATIVE
Ketones, ur: 15 mg/dL — AB
Leukocytes,Ua: NEGATIVE
Nitrite: NEGATIVE
Protein, ur: NEGATIVE mg/dL
Specific Gravity, Urine: 1.037 — ABNORMAL HIGH (ref 1.005–1.030)
pH: 7.5 (ref 5.0–8.0)

## 2023-12-15 LAB — RESP PANEL BY RT-PCR (RSV, FLU A&B, COVID)  RVPGX2
Influenza A by PCR: NEGATIVE
Influenza B by PCR: NEGATIVE
Resp Syncytial Virus by PCR: NEGATIVE
SARS Coronavirus 2 by RT PCR: NEGATIVE

## 2023-12-15 LAB — LACTIC ACID, PLASMA
Lactic Acid, Venous: 2.1 mmol/L (ref 0.5–1.9)
Lactic Acid, Venous: 1.7 mmol/L (ref 0.5–1.9)

## 2023-12-15 LAB — BASIC METABOLIC PANEL WITH GFR
Anion gap: 11 (ref 5–15)
BUN: 11 mg/dL (ref 8–23)
CO2: 27 mmol/L (ref 22–32)
Calcium: 10.2 mg/dL (ref 8.9–10.3)
Chloride: 100 mmol/L (ref 98–111)
Creatinine, Ser: 0.89 mg/dL (ref 0.44–1.00)
GFR, Estimated: >60 mL/min (ref >60)
Glucose, Bld: 153 mg/dL — ABNORMAL HIGH (ref 70–99)
Potassium: 5.5 mmol/L — ABNORMAL HIGH (ref 3.5–5.1)
Sodium: 138 mmol/L (ref 135–145)

## 2023-12-15 LAB — CBC
HCT: 41.1 % (ref 36.0–46.0)
Hemoglobin: 13.8 g/dL (ref 12.0–15.0)
MCH: 31.4 pg (ref 26.0–34.0)
MCHC: 33.6 g/dL (ref 30.0–36.0)
MCV: 93.4 fL (ref 80.0–100.0)
Platelets: 255 K/uL (ref 150–400)
RBC: 4.4 MIL/uL (ref 3.87–5.11)
RDW: 14.2 % (ref 11.5–15.5)
WBC: 10.8 K/uL — ABNORMAL HIGH (ref 4.0–10.5)
nRBC: 0 % (ref 0.0–0.2)

## 2023-12-15 LAB — TROPONIN T, HIGH SENSITIVITY
Troponin T High Sensitivity: <15 ng/L (ref 0–19)
Troponin T High Sensitivity: <15 ng/L (ref 0–19)

## 2023-12-15 MED ORDER — ALBUTEROL SULFATE (2.5 MG/3ML) 0.083% IN NEBU: 3.0000 mL | INHALATION_SOLUTION | RESPIRATORY_TRACT | Status: DC | PRN

## 2023-12-15 MED ORDER — MORPHINE SULFATE (PF) 4 MG/ML IV SOLN
4.0000 mg | Freq: Once | INTRAVENOUS | Status: AC
  Administered 2023-12-15: 4 mg via INTRAVENOUS
  Filled 2023-12-15: qty 1

## 2023-12-15 MED ORDER — SODIUM CHLORIDE 0.9% FLUSH
3.0000 mL | Freq: Two times a day (BID) | INTRAVENOUS | Status: DC
  Administered 2023-12-16 – 2023-12-18 (×6): 3 mL via INTRAVENOUS

## 2023-12-15 MED ORDER — FAMOTIDINE 20 MG PO TABS
40.0000 mg | ORAL_TABLET | Freq: Every day | ORAL | Status: DC
  Administered 2023-12-16: 40 mg via ORAL
  Filled 2023-12-15: qty 2

## 2023-12-15 MED ORDER — AMLODIPINE BESYLATE 10 MG PO TABS
5.0000 mg | ORAL_TABLET | Freq: Every day | ORAL | Status: DC
  Administered 2023-12-16 – 2023-12-17 (×2): 5 mg via ORAL
  Filled 2023-12-15 (×2): qty 1

## 2023-12-15 MED ORDER — IOHEXOL 350 MG/ML SOLN
100.0000 mL | Freq: Once | INTRAVENOUS | Status: AC | PRN
  Administered 2023-12-15: 100 mL via INTRAVENOUS

## 2023-12-15 MED ORDER — NITROGLYCERIN 0.4 MG SL SUBL: 0.4000 mg | SUBLINGUAL_TABLET | SUBLINGUAL | Status: DC | PRN

## 2023-12-15 MED ORDER — SODIUM ZIRCONIUM CYCLOSILICATE 5 G PO PACK
5.0000 g | PACK | Freq: Once | ORAL | Status: AC
  Administered 2023-12-15: 5 g via ORAL
  Filled 2023-12-15: qty 1

## 2023-12-15 MED ORDER — LACTATED RINGERS IV BOLUS (SEPSIS)
1000.0000 mL | Freq: Once | INTRAVENOUS | Status: AC
  Administered 2023-12-15: 1000 mL via INTRAVENOUS

## 2023-12-15 MED ORDER — MORPHINE SULFATE (PF) 2 MG/ML IV SOLN
2.0000 mg | INTRAVENOUS | Status: DC | PRN
  Administered 2023-12-16 – 2023-12-17 (×4): 2 mg via INTRAVENOUS
  Filled 2023-12-15 (×4): qty 1

## 2023-12-15 MED ORDER — ACETAMINOPHEN 325 MG PO TABS: 650.0000 mg | ORAL_TABLET | Freq: Four times a day (QID) | ORAL | Status: DC | PRN

## 2023-12-15 MED ORDER — VANCOMYCIN HCL IN DEXTROSE 1-5 GM/200ML-% IV SOLN
1000.0000 mg | Freq: Once | INTRAVENOUS | Status: AC
  Administered 2023-12-15: 1000 mg via INTRAVENOUS
  Filled 2023-12-15: qty 200

## 2023-12-15 MED ORDER — ACETAMINOPHEN 325 MG PO TABS
650.0000 mg | ORAL_TABLET | Freq: Once | ORAL | Status: AC
  Administered 2023-12-15: 650 mg via ORAL
  Filled 2023-12-15: qty 2

## 2023-12-15 MED ORDER — FENTANYL CITRATE (PF) 50 MCG/ML IJ SOSY
50.0000 ug | PREFILLED_SYRINGE | Freq: Once | INTRAMUSCULAR | Status: AC
  Administered 2023-12-15: 50 ug via INTRAVENOUS
  Filled 2023-12-15: qty 1

## 2023-12-15 MED ORDER — CYCLOBENZAPRINE HCL 10 MG PO TABS
10.0000 mg | ORAL_TABLET | Freq: Three times a day (TID) | ORAL | Status: DC | PRN
  Administered 2023-12-16 (×2): 10 mg via ORAL
  Filled 2023-12-15 (×2): qty 1

## 2023-12-15 MED ORDER — FOLIC ACID 1 MG PO TABS
2.0000 mg | ORAL_TABLET | Freq: Every day | ORAL | Status: DC
  Administered 2023-12-16 – 2023-12-18 (×3): 2 mg via ORAL
  Filled 2023-12-15 (×3): qty 2

## 2023-12-15 MED ORDER — LACTATED RINGERS IV SOLN: INTRAVENOUS | Status: DC

## 2023-12-15 MED ORDER — ACETAMINOPHEN 650 MG RE SUPP: 650.0000 mg | Freq: Four times a day (QID) | RECTAL | Status: DC | PRN

## 2023-12-15 MED ORDER — LEVOTHYROXINE SODIUM 100 MCG PO TABS
100.0000 ug | ORAL_TABLET | Freq: Every day | ORAL | Status: AC
  Administered 2023-12-16 – 2023-12-18 (×3): 100 ug via ORAL
  Filled 2023-12-15 (×4): qty 1

## 2023-12-15 MED ORDER — METRONIDAZOLE 500 MG/100ML IV SOLN
500.0000 mg | Freq: Once | INTRAVENOUS | Status: AC
  Administered 2023-12-15: 500 mg via INTRAVENOUS
  Filled 2023-12-15: qty 100

## 2023-12-15 MED ORDER — ONDANSETRON HCL 4 MG PO TABS: 4.0000 mg | ORAL_TABLET | Freq: Four times a day (QID) | ORAL | Status: DC | PRN

## 2023-12-15 MED ORDER — SENNOSIDES-DOCUSATE SODIUM 8.6-50 MG PO TABS
1.0000 | ORAL_TABLET | Freq: Every evening | ORAL | Status: DC | PRN
  Administered 2023-12-17: 1 via ORAL
  Filled 2023-12-15 (×2): qty 1

## 2023-12-15 MED ORDER — ONDANSETRON HCL 4 MG/2ML IJ SOLN: 4.0000 mg | Freq: Four times a day (QID) | INTRAMUSCULAR | Status: DC | PRN

## 2023-12-15 MED ORDER — ENOXAPARIN SODIUM 40 MG/0.4ML IJ SOSY
40.0000 mg | PREFILLED_SYRINGE | INTRAMUSCULAR | Status: DC
  Administered 2023-12-16 – 2023-12-18 (×2): 40 mg via SUBCUTANEOUS
  Filled 2023-12-15 (×3): qty 0.4

## 2023-12-15 MED ORDER — SODIUM CHLORIDE 0.9 % IV SOLN
2.0000 g | Freq: Once | INTRAVENOUS | Status: AC
  Administered 2023-12-15: 2 g via INTRAVENOUS
  Filled 2023-12-15: qty 12.5

## 2023-12-15 NOTE — ED Provider Notes (Signed)
 [place] EMERGENCY DEPARTMENT AT [facility] Provider Note   CSN: [id number] Arrival date & time: 12/15/23  1407     Patient presents with: Chest Pain   Christy Moore is a 78 y.o. female.  History of Sjogren syndrome, Raynaud's phenomenon, cardiomegaly, MGUS, esophageal stricture status post lesion.  Presents ER today complaining of constant chest pain rated 8 out of 10 the pain scale that started suddenly at 930 this morning.  She denies shortness of breath, denies fever or chills.  She is radiates straight through to her back.  She also feels shaky.  She was watching TV when this started.  Denies fever or chills, denies abdominal pain or vomiting, denies sweating.    Chest Pain      Prior to Admission medications   Medication Sig Start Date End Date Taking? Authorizing Provider  acetaminophen  (TYLENOL ) 500 MG tablet Take 500 mg by mouth as needed.    [provider]  albuterol  (VENTOLIN  HFA) 108 (90 Base) MCG/ACT inhaler Inhale 1-2 puffs into the lungs every 4 (four) hours as needed for wheezing or shortness of breath.    [provider]  amLODipine  (NORVASC ) 5 MG tablet TAKE 1 TABLET (5 MG TOTAL) BY MOUTH DAILY. 10/12/23   Burchette, Wolm ORN, MD  cyclobenzaprine  (FLEXERIL ) 10 MG tablet Take 1 tablet (10 mg total) by mouth 3 (three) times daily as needed for muscle spasms. 08/28/23   Johnny Garnette LABOR, MD  famotidine  (PEPCID ) 40 MG tablet TAKE 1 TABLET BY MOUTH EVERYDAY AT BEDTIME 10/12/23   Burchette, Wolm ORN, MD  fluticasone  (FLONASE ) 50 MCG/ACT nasal spray Place 2 sprays into both nostrils daily as needed for allergies.    [provider]  folic acid  (FOLVITE ) 1 MG tablet Take 2 tablets (2 mg total) by mouth daily. 07/22/23   Dolphus Reiter, MD  levothyroxine  (SYNTHROID ) 100 MCG tablet TAKE 1 TABLET BY MOUTH EVERY DAY 12/09/23   Burchette, Wolm ORN, MD  methotrexate  (RHEUMATREX) 2.5 MG tablet Take 5 tablets (12.5 mg total) by mouth once a week.  11/30/23   Cheryl Waddell HERO, PA-C  predniSONE  (DELTASONE ) 5 MG tablet Take one tablet by mouth once daily for 10 days. 12/01/23   Burchette, Wolm ORN, MD    Allergies: Albuterol -budesonide, Amoxicillin -pot clavulanate, Azithromycin , Broccoli [brassica oleracea], Celecoxib, Ciprofloxacin, Doxycycline , Erythromycin, Hydrocodone, Hydrocodone-acetaminophen , Levofloxacin, Omeprazole , Penicillins, Propoxyphene n-acetaminophen , Rofecoxib, Sulfa antibiotics, and Tetanus toxoid    Review of Systems  Cardiovascular:  Positive for chest pain.    Updated Vital Signs BP 118/61 (BP Location: Left Arm)   Pulse 96   Temp 98.9 F (37.2 C) (Oral)   Resp 18   Ht 5' 4.5 (1.638 m)   Wt 67.8 kg   SpO2 98%   BMI 25.26 kg/m   Physical Exam Vitals and nursing note reviewed.  Constitutional:      Appearance: She is well-developed.     Comments: Patient appears uncomfortable, holding her chest with her right hand  HENT:     Head: Normocephalic and atraumatic.  Eyes:     Conjunctiva/sclera: Conjunctivae normal.  Cardiovascular:     Rate and Rhythm: Normal rate and regular rhythm.     Pulses:          Radial pulses are 2+ on the right side and 2+ on the left side.       Dorsalis pedis pulses are 2+ on the right side and 2+ on the left side.     Heart sounds: No murmur  heard. Pulmonary:     Effort: Pulmonary effort is normal. No respiratory distress.     Breath sounds: Normal breath sounds.  Abdominal:     Palpations: Abdomen is soft.     Tenderness: There is no abdominal tenderness.  Musculoskeletal:        General: No swelling.     Cervical back: Neck supple.     Right lower leg: No tenderness. No edema.     Left lower leg: No tenderness. No edema.  Skin:    General: Skin is warm and dry.     Capillary Refill: Capillary refill takes less than 2 seconds.  Neurological:     General: No focal deficit present.     Mental Status: She is alert.  Psychiatric:        Mood and Affect: Mood normal.      (all labs ordered are listed, but only abnormal results are displayed) Labs Reviewed  BASIC METABOLIC PANEL WITH GFR - Abnormal; Notable for the following components:      Result Value   Potassium 5.5 (*)    Glucose, Bld 153 (*)    All other components within normal limits  CBC - Abnormal; Notable for the following components:   WBC 10.8 (*)    All other components within normal limits  LACTIC ACID, PLASMA - Abnormal; Notable for the following components:   Lactic Acid, Venous 2.1 (*)    All other components within normal limits  URINALYSIS, ROUTINE W REFLEX MICROSCOPIC - Abnormal; Notable for the following components:   Color, Urine COLORLESS (*)    Specific Gravity, Urine 1.037 (*)    Ketones, ur 15 (*)    All other components within normal limits  RESP PANEL BY RT-PCR (RSV, FLU A&B, COVID)  RVPGX2  CULTURE, BLOOD (SINGLE)  URINE CULTURE  LACTIC ACID, PLASMA  TROPONIN T, HIGH SENSITIVITY  TROPONIN T, HIGH SENSITIVITY    EKG: EKG Interpretation Date/Time:  Tuesday December 15 2023 14:19:45 EST Ventricular Rate:  101 PR Interval:  160 QRS Duration:  118 QT Interval:  360 QTC Calculation: 466 R Axis:   -78  Text Interpretation: Sinus tachycardia with Premature atrial complexes Right atrial enlargement Pulmonary disease pattern Right bundle branch block Left anterior fascicular block Bifascicular block Left ventricular hypertrophy with repolarization abnormality ( R in aVL , Romhilt-Estes ) Cannot rule out Septal infarct (cited on or before 06-Mar-2023) Abnormal ECG When compared with ECG of 24-Nov-2023 15:14, Premature atrial complexes are now Present No significant change since last tracing Confirmed by Rogelia Satterfield ([id number]) on 12/15/2023 4:01:02 PM  Radiology: CT Angio Chest/Abd/Pel for Dissection W and/or Wo Contrast Result Date: 12/15/2023 CLINICAL DATA:  Chest pain radiating to back and throat, nausea since this morning, difficulty breathing EXAM: CT ANGIOGRAPHY  CHEST, ABDOMEN AND PELVIS TECHNIQUE: Non-contrast CT of the chest was initially obtained. Multidetector CT imaging through the chest, abdomen and pelvis was performed using the standard protocol during bolus administration of intravenous contrast. Multiplanar reconstructed images and MIPs were obtained and reviewed to evaluate the vascular anatomy. RADIATION DOSE REDUCTION: This exam was performed according to the departmental dose-optimization program which includes automated exposure control, adjustment of the mA and/or kV according to patient size and/or use of iterative reconstruction technique. CONTRAST:  OMNIPAQUE  IOHEXOL  350 MG/ML SOLN COMPARISON:  12/15/2023, 07/01/2023 FINDINGS: CTA CHEST FINDINGS Cardiovascular: No evidence of thoracic aortic aneurysm or dissection. Atherosclerosis of the thoracic aorta. Average origin of the right subclavian artery is noted, a frequent  anatomic variant. The heart is enlarged, without pericardial effusion. There is technically adequate opacification of the pulmonary vasculature, with no filling defects or pulmonary emboli identified. Mediastinum/Nodes: No enlarged mediastinal, hilar, or axillary lymph nodes. Thyroid  gland, trachea, and esophagus demonstrate no significant findings. Lungs/Pleura: There is a trace left pleural effusion. Stable bibasilar scarring, left greater than right. No acute airspace disease. No pneumothorax. Mild bilateral lower lobe bronchiectasis. Central airways are patent. Musculoskeletal: No acute or destructive bony abnormalities. Reconstructed images demonstrate no additional findings. Review of the MIP images confirms the above findings. CTA ABDOMEN AND PELVIS FINDINGS VASCULAR Aorta: Normal caliber aorta without aneurysm, dissection, vasculitis or significant stenosis. Mild atherosclerosis. Celiac: Patent without evidence of aneurysm, dissection, vasculitis or significant stenosis. SMA: Patent without evidence of aneurysm, dissection,  vasculitis or significant stenosis. Renals: Both renal arteries are patent without evidence of aneurysm, dissection, vasculitis, fibromuscular dysplasia or significant stenosis. IMA: Patent without evidence of aneurysm, dissection, vasculitis or significant stenosis. Inflow: Patent without evidence of aneurysm, dissection, vasculitis or significant stenosis. Veins: No obvious venous abnormality within the limitations of this arterial phase study. Review of the MIP images confirms the above findings. NON-VASCULAR Hepatobiliary: No focal liver abnormality is seen. No gallstones, gallbladder wall thickening, or biliary dilatation. Pancreas: Unremarkable. No pancreatic ductal dilatation or surrounding inflammatory changes. Spleen: Normal in size without focal abnormality. Adrenals/Urinary Tract: Adrenal glands are unremarkable. Kidneys are normal, without renal calculi, focal lesion, or hydronephrosis. Bladder is unremarkable. Stomach/Bowel: No bowel obstruction or ileus. Normal appendix right lower quadrant. No bowel wall thickening or inflammatory change. Lymphatic: No pathologic adenopathy within the abdomen or pelvis. Reproductive: Uterus and bilateral adnexa are unremarkable. Other: No free fluid or free intraperitoneal gas. Small fat containing umbilical hernia. No bowel herniation. Musculoskeletal: No acute or destructive bony abnormalities. Reconstructed images demonstrate no additional findings. Review of the MIP images confirms the above findings. IMPRESSION: Vascular: 1. No evidence of thoracoabdominal aortic aneurysm or dissection. 2. No evidence of pulmonary embolus. 3.  Aortic Atherosclerosis (ICD10-I70.0). Nonvascular: 1. Cardiomegaly. 2. Trace left pleural effusion. 3. Chronic bibasilar scarring and bronchiectasis. 4. No acute intra-abdominal or intrapelvic process. Electronically Signed   By: Ozell Daring M.D.   On: 12/15/2023 17:32   DG Chest 2 View Result Date: 12/15/2023 EXAM: 2 VIEW(S) XRAY OF  THE CHEST 12/15/2023 02:44:25 PM COMPARISON: 05/17/2023 CLINICAL HISTORY: CP FINDINGS: LUNGS AND PLEURA: Linear scarring at right lung base. Asymmetric patchy opacities at left lung base. Possible small left pleural effusion. No pneumothorax. HEART AND MEDIASTINUM: Atherosclerotic aortic calcifications. No acute abnormality of the cardiac and mediastinal silhouettes. BONES AND SOFT TISSUES: No acute osseous abnormality. IMPRESSION: 1. Asymmetric patchy opacities at the left lung base with a small left pleural effusion is suspected. Electronically signed by: Norleen Boxer MD 12/15/2023 03:42 PM EST RP Workstation: [id number]     Procedures   Medications Ordered in the ED  lactated ringers  infusion ( Intravenous New Bag/Given 12/15/23 2055)  fentaNYL  (SUBLIMAZE ) injection 50 mcg (50 mcg Intravenous Given 12/15/23 1529)  iohexol  (OMNIPAQUE ) 350 MG/ML injection 100 mL (100 mLs Intravenous Contrast Given 12/15/23 1648)  morphine  (PF) 4 MG/ML injection 4 mg (4 mg Intravenous Given 12/15/23 1713)  acetaminophen  (TYLENOL ) tablet 650 mg (650 mg Oral Given 12/15/23 1826)  ceFEPIme  (MAXIPIME ) 2 g in sodium chloride  0.9 % 100 mL IVPB (0 g Intravenous Stopped 12/15/23 1943)  metroNIDAZOLE  (FLAGYL ) IVPB 500 mg (0 mg Intravenous Stopped 12/15/23 2028)  vancomycin  (VANCOCIN ) IVPB 1000 mg/200 mL premix (0 mg Intravenous  Stopped 12/15/23 2027)  lactated ringers  bolus 1,000 mL (0 mLs Intravenous Stopped 12/15/23 1859)    And  lactated ringers  bolus 1,000 mL (0 mLs Intravenous Stopped 12/15/23 1943)  morphine  (PF) 4 MG/ML injection 4 mg (4 mg Intravenous Given 12/15/23 1852)  sodium zirconium cyclosilicate  (LOKELMA ) packet 5 g (5 g Oral Given 12/15/23 1940)                                    Medical Decision Making This patient presents to the ED for concern of chest pain radiating to the upper back that started acutely this morning, this involves an extensive number of treatment options, and is a complaint that carries  with it a high risk of complications and morbidity.  The differential diagnosis includes aortic dissection, ACS, PE, pneumonia, other   Co morbidities that complicate the patient evaluation :   GERD, sinus tachycardia, depression   Additional history obtained:  Additional history obtained from MR External records from outside source obtained and reviewed including previous notes, labs, imaging   Lab Tests:  I Ordered, and personally interpreted labs.  The pertinent results include: Potassium 5.5-given Lokelma  Her blood cell count 10.8, no anemia Troponin negative x 2 UA with no UTI COVID flu RSV negative Lactic acid 2.1, subsequently 1.7   Imaging Studies ordered:  I ordered imaging studies including CT angio chest abdomen pelvis which shows dissection, no acute findings I independently visualized and interpreted imaging within scope of identifying emergent findings  I agree with the radiologist interpretation   Cardiac Monitoring: / EKG:  The patient was maintained on a cardiac monitor.  I personally viewed and interpreted the cardiac monitored which showed an underlying rhythm of: Sinus rhythm, right bundle branch block   Consultations Obtained:  I requested consultation with the hospitalist,  and discussed lab and imaging findings as well as pertinent plan - they recommend: Patient for sepsis   Problem List / ED Course / Critical interventions / Medication management  Given the severe chest pain rating to her back, concern urgently for dissection or other acute intrathoracic process.  Patient is very comfortable, fentanyl  did not help her pain, given morphine  x 2 and ultimately did get some control.  While in the ED workup was overall fairly reassuring, but patient did develop fever over 101F given Tylenol  and fluids, further sepsis workup was ordered, lactic acid slightly elevated patient was given a fluid bolus, empiric antibiotics, no source found, cultures were ordered  and discussed with hospitalist for admission I ordered medication including morphine  for pain Reevaluation of the patient after these medicines showed that the patient improved I have reviewed the patients home medicines and have made adjustments as needed       Amount and/or Complexity of Data Reviewed Labs: ordered. Radiology: ordered.  Risk OTC drugs. Prescription drug management. Decision regarding hospitalization.        Final diagnoses:  Sepsis with acute organ dysfunction without septic shock, due to unspecified organism, unspecified organ dysfunction type [facility])    ED Discharge Orders     None          Suellen Sherran LABOR, PA-C 12/15/23 2212

## 2023-12-15 NOTE — ED Notes (Signed)
 Pt felt warm. Oral temp 101.6. PA made aware.

## 2023-12-15 NOTE — ED Notes (Signed)
 ED Provider at bedside.

## 2023-12-15 NOTE — Progress Notes (Signed)
 Elink is following code sepsis.

## 2023-12-15 NOTE — ED Notes (Signed)
 Spo2 87% on ROOM air. 2 L O2 applied via nasal canula  Provider notified

## 2023-12-15 NOTE — H&P (Signed)
 History and Physical    Christy Moore:[id number] DOB: 1945-02-17 DOA: 12/15/2023  PCP: Christy Wolm LELON, MD  Patient coming from: Home  I have personally briefly reviewed patient's old medical records in [facility]  Chief Complaint: Chest pain  HPI: Christy Moore is a 78 y.o. female with medical history significant for Sjogren syndrome, rheumatoid arthritis, ILD, bronchiectasis, HTN, hypothyroidism, cardiomegaly, esophageal stricture s/p dilation who presented to the ED for evaluation of chest pain.  Patient reports developing central chest pain which feels like it is radiating straight to her back.  Pain began this morning around 8:30 AM after waking up and while watching TV.  She says pain is worse when she leans forward.  She reports chronic issues with swallowing related to her Sjogren syndrome.  She denies any recent choking episodes.  She reports shortness of breath and cough when she takes deep breaths which has not really changed from her baseline.  She has not had any subjective fevers but did have a recorded fever in the ED up to 101.6 F.  She has not had any chills or diaphoresis.  She reports nausea but no emesis.  She has not had any abdominal pain or dysuria.  [facility] ED Course  Labs/Imaging on admission: I have personally reviewed following labs and imaging studies.  Initial vitals showed BP 134/71, pulse 105, RR 17, temp 98.8 F, SpO2 100% on room air.  While in the ED patient developed fever of 101.6 F and was tachypneic with RR 25-30 and tachycardic with pulse up to 120s.  She was found to be hypoxic to 87% while on room air and was placed on 2 L supplemental O2 via [place] with improvement.  Labs show WBC 10.8, hemoglobin 13.8, platelets 2055, sodium 138, potassium 5.5, bicarb 27, BUN 11, creatinine 0.9, serum glucose 153, troponin T <15 x 2, lactic acid 2.1 > 1.7.  SARS-CoV-2, influenza, RSV PCR negative.  UA negative for UTI.  Single blood  culture set collected and in process.  CTA chest/abdomen/pelvis showed cardiomegaly, trace left pleural effusion, chronic bibasilar scarring and bronchiectasis.  No evidence of PE, thoracoabdominal aortic aneurysm or dissection, and no intra-abdominal or intrapelvic process identified.  Patient was given 2 L LR, IV vancomycin , cefepime , Flagyl , oral Lokelma  5 g, IV morphine  4 mg x 2, and IV fentanyl  50 mcg.  The hospitalist service was consulted for admission.  Review of Systems: All systems reviewed and are negative except as documented in history of present illness above.   Past Medical History:  Diagnosis Date   Arthritis    Cardiomegaly    Chronic pneumonia    Depression    GERD (gastroesophageal reflux disease)    Hypertension    Hypothyroidism    Raynaud disease    Seizures (HCC)    Sinus tachycardia    Sjogren's syndrome    Status post dilation of esophageal narrowing 2012   Dr. Luis    Past Surgical History:  Procedure Laterality Date   BASAL CELL CARCINOMA EXCISION  2024   CATARACT EXTRACTION, BILATERAL  2024   HAND TENDON SURGERY  02/09/2007   tendon transfer-Dr. Camella   NASAL SEPTUM SURGERY     operated on by Dr. Floy 20 years ago   OVARIAN CYST REMOVAL     Right Sided Salpingo-oopherectomy   SALPINGOOPHORECTOMY     WISDOM TOOTH EXTRACTION      Social History: ***  Allergies  Allergen Reactions   Albuterol -Budesonide Other (See  Comments)    Burned her tongue  albuterol  / budesonide   Amoxicillin -Pot Clavulanate     REACTION: nausea   Azithromycin      REACTION: nausea, Gi  Other Reaction(s): Unknown   Broccoli [Brassica Oleracea]    Celecoxib Other (See Comments)    REACTION: nausea  celecoxib   Ciprofloxacin Other (See Comments)    REACTION: nausea  ciprofloxacin   Doxycycline  Other (See Comments)    REACTION: gi upset   Erythromycin Other (See Comments)    REACTION: nause, GI   Hydrocodone Other (See Comments)    REACTION: nausea    Hydrocodone-Acetaminophen      REACTION: GI upset   Levofloxacin Other (See Comments)    REACTION: she claims renal failiure  levofloxacin   Omeprazole  Nausea And Vomiting and Other (See Comments)    omeprazole    Penicillins    Propoxyphene N-Acetaminophen     Rofecoxib     REACTION: nausea   Sulfa Antibiotics    Tetanus Toxoid     Family History  Problem Relation Age of Onset   Cancer Mother        breast and lung   Stroke Mother 52   Hypothyroidism Mother    Cancer Father        prostate   Heart attack Father 5   Alcohol abuse Maternal Grandmother    Alcohol abuse Maternal Grandfather    Allergies Other        Grandmother-pt states she takes after grandmother who had multiple allergies   Colon cancer Neg Hx    Esophageal cancer Neg Hx    Stomach cancer Neg Hx    Rectal cancer Neg Hx      Prior to Admission medications   Medication Sig Start Date End Date Taking? Authorizing Provider  acetaminophen  (TYLENOL ) 500 MG tablet Take 500 mg by mouth as needed.    [provider]  albuterol  (VENTOLIN  HFA) 108 (90 Base) MCG/ACT inhaler Inhale 1-2 puffs into the lungs every 4 (four) hours as needed for wheezing or shortness of breath.    [provider]  amLODipine  (NORVASC ) 5 MG tablet TAKE 1 TABLET (5 MG TOTAL) BY MOUTH DAILY. 10/12/23   Burchette, Wolm ORN, MD  cyclobenzaprine  (FLEXERIL ) 10 MG tablet Take 1 tablet (10 mg total) by mouth 3 (three) times daily as needed for muscle spasms. 08/28/23   Johnny Garnette LABOR, MD  famotidine  (PEPCID ) 40 MG tablet TAKE 1 TABLET BY MOUTH EVERYDAY AT BEDTIME 10/12/23   Burchette, Wolm ORN, MD  fluticasone  (FLONASE ) 50 MCG/ACT nasal spray Place 2 sprays into both nostrils daily as needed for allergies.    [provider]  folic acid  (FOLVITE ) 1 MG tablet Take 2 tablets (2 mg total) by mouth daily. 07/22/23   Dolphus Reiter, MD  levothyroxine  (SYNTHROID ) 100 MCG tablet TAKE 1 TABLET BY MOUTH EVERY DAY 12/09/23    Burchette, Wolm ORN, MD  methotrexate  (RHEUMATREX) 2.5 MG tablet Take 5 tablets (12.5 mg total) by mouth once a week. 11/30/23   Cheryl Waddell HERO, PA-C  predniSONE  (DELTASONE ) 5 MG tablet Take one tablet by mouth once daily for 10 days. 12/01/23   Christy Wolm ORN, MD    Physical Exam: Vitals:   12/15/23 2057 12/15/23 2100 12/15/23 2154 12/15/23 2200  BP: 113/70 115/67 118/61   Pulse: 91 91 96   Resp: (!) 27 (!) 23 18   Temp:   98.9 F (37.2 C)   TempSrc:   Oral   SpO2: 96% 95%  98%   Weight:    67.8 kg  Height:    5' 4.5 (1.638 m)   Constitutional: Resting in bed, NAD, calm, comfortable Eyes: EOMI, lids and conjunctivae normal ENMT: Mucous membranes are moist. Posterior pharynx clear of any exudate or lesions.Normal dentition.  Neck: normal, supple, no masses. Respiratory: Bibasilar inspiratory crackles. Normal respiratory effort. No accessory muscle use.  Cardiovascular: Regular rate and rhythm, no murmurs / rubs / gallops. No extremity edema. 2+ pedal pulses. Abdomen: no tenderness, no masses palpated. Musculoskeletal: no clubbing / cyanosis. No joint deformity upper and lower extremities. Good ROM, no contractures. Normal muscle tone.  Skin: no rashes, lesions, ulcers. No induration Neurologic: Sensation intact. Strength 5/5 in all 4.  Psychiatric: Normal judgment and insight. Alert and oriented x 3. Normal mood.   EKG: Personally reviewed. Sinus rhythm, rate 101, PACs, RBBB, LAFB.  Similar to previous.  Assessment/Plan Principal Problem:   SIRS (systemic inflammatory response syndrome) (HCC) Active Problems:   Acute respiratory failure with hypoxia (HCC)   Hypothyroidism   Hypertension   Sjogren's disease   *** No notes on file *** Assessment and Plan: Chest pain: ***  SIRS: ***  Acute respiratory failure with hypoxia: ***  Hypertension: ***  Hypothyroidism: ***  Sjogren's syndrome/rheumatoid arthritis: ***    DVT prophylaxis: ***  Code Status: ***   Family Communication: ***  Disposition Plan: ***  Consults called: ***  Severity of Illness: The appropriate patient status for this patient is OBSERVATION. Observation status is judged to be reasonable and necessary in order to provide the required intensity of service to ensure the patient's safety. The patient's presenting symptoms, physical exam findings, and initial radiographic and laboratory data in the context of their medical condition is felt to place them at decreased risk for further clinical deterioration. Furthermore, it is anticipated that the patient will be medically stable for discharge from the hospital within 2 midnights of admission.   Jorie Blanch MD [facility]  If 7PM-7AM, please contact night-coverage [email or web address]  12/15/2023, 11:58 PM

## 2023-12-15 NOTE — ED Triage Notes (Signed)
 Pt caox4 c/o CP radiating to the back and throat  with nausea that started this morning at approx 0830 after waking up while watching TV. Pt also states she feels like she can't take a deep breath.

## 2023-12-15 NOTE — ED Notes (Signed)
 [facility] at bedside

## 2023-12-15 NOTE — Progress Notes (Signed)
 Arrived to [facility] 4[facility] rm 29 from [place]. States chest pain is much better and tolerable now at 3/10 soreness in chest like a strain feeling, Denies nausea, dizziness, shortness of breath.On 2L [place]. Alert and oriented to person, place, time, situation. LR infusing at 150 cc/hr. Call light in reach. Husband at bedside.

## 2023-12-15 NOTE — ED Notes (Signed)
 Patient transported to CT

## 2023-12-15 NOTE — Plan of Care (Signed)
 Plan of Care Note for accepted transfer   Patient name: Christy Moore FMW:[id number] DOB: 12/19/1945  Facility requesting transfer: [facility] Requesting Provider: Suellen Sherran [name]  Facility course: 78 year old female with history of Sjogren syndrome, Raynaud's phenomenon, cardiomegaly, MGUS, esophageal stricture status post dilation, GERD, hypertension, hypothyroidism, seizures presented with complaint of severe chest pain radiating to her back.  SpO2 87% on room air, improved with 2 L [place].  Patient noted to be febrile, tachycardic, and tachypneic.  Not hypotensive.  EKG without STEMI.  Troponin negative x 2.  Potassium mildly elevated.  Lactate 2.1, WBC count 10.8, UA not suggestive of infection, COVID/influenza/RSV PCR negative.  Urine and blood cultures collected.  CTA chest/abdomen/pelvis negative for aortic dissection or PE.  Showing cardiomegaly, trace left pleural effusion, chronic bibasilar scarring, and bronchiectasis.  No acute intra-abdominal or intrapelvic process seen.  Patient was given IV fluid, analgesics, Lokelma , and broad-spectrum antibiotics.  Plan of care: The patient is accepted for admission to Progressive unit at [facility].  [facility] will assume care on arrival to accepting facility. Until arrival, care as per EDP. However, [facility] available 24/7 for questions and assistance.  Check [email or web address] for on-call coverage.  Nursing staff, please call [facility] Admits & Consults System-Wide number under Amion on patient's arrival so appropriate admitting provider can evaluate the pt.

## 2023-12-16 ENCOUNTER — Observation Stay (HOSPITAL_BASED_OUTPATIENT_CLINIC_OR_DEPARTMENT_OTHER)

## 2023-12-16 ENCOUNTER — Encounter (HOSPITAL_COMMUNITY): Payer: Self-pay | Admitting: Internal Medicine

## 2023-12-16 DIAGNOSIS — R079 Chest pain, unspecified: Secondary | ICD-10-CM | POA: Diagnosis not present

## 2023-12-16 DIAGNOSIS — A419 Sepsis, unspecified organism: Secondary | ICD-10-CM | POA: Diagnosis present

## 2023-12-16 DIAGNOSIS — J849 Interstitial pulmonary disease, unspecified: Secondary | ICD-10-CM | POA: Diagnosis present

## 2023-12-16 DIAGNOSIS — R6510 Systemic inflammatory response syndrome (SIRS) of non-infectious origin without acute organ dysfunction: Secondary | ICD-10-CM | POA: Diagnosis not present

## 2023-12-16 DIAGNOSIS — R6520 Severe sepsis without septic shock: Secondary | ICD-10-CM | POA: Diagnosis present

## 2023-12-16 DIAGNOSIS — E039 Hypothyroidism, unspecified: Secondary | ICD-10-CM | POA: Diagnosis present

## 2023-12-16 DIAGNOSIS — R1314 Dysphagia, pharyngoesophageal phase: Secondary | ICD-10-CM | POA: Diagnosis present

## 2023-12-16 DIAGNOSIS — R1310 Dysphagia, unspecified: Secondary | ICD-10-CM | POA: Diagnosis not present

## 2023-12-16 DIAGNOSIS — R0789 Other chest pain: Secondary | ICD-10-CM | POA: Diagnosis not present

## 2023-12-16 DIAGNOSIS — I272 Pulmonary hypertension, unspecified: Secondary | ICD-10-CM | POA: Diagnosis present

## 2023-12-16 DIAGNOSIS — I4901 Ventricular fibrillation: Secondary | ICD-10-CM | POA: Diagnosis not present

## 2023-12-16 DIAGNOSIS — F32A Depression, unspecified: Secondary | ICD-10-CM | POA: Diagnosis present

## 2023-12-16 DIAGNOSIS — I452 Bifascicular block: Secondary | ICD-10-CM | POA: Diagnosis present

## 2023-12-16 DIAGNOSIS — I4891 Unspecified atrial fibrillation: Secondary | ICD-10-CM | POA: Diagnosis not present

## 2023-12-16 DIAGNOSIS — J471 Bronchiectasis with (acute) exacerbation: Secondary | ICD-10-CM | POA: Diagnosis present

## 2023-12-16 DIAGNOSIS — I10 Essential (primary) hypertension: Secondary | ICD-10-CM | POA: Diagnosis present

## 2023-12-16 DIAGNOSIS — I4892 Unspecified atrial flutter: Secondary | ICD-10-CM | POA: Diagnosis not present

## 2023-12-16 DIAGNOSIS — M069 Rheumatoid arthritis, unspecified: Secondary | ICD-10-CM | POA: Diagnosis present

## 2023-12-16 DIAGNOSIS — J90 Pleural effusion, not elsewhere classified: Secondary | ICD-10-CM | POA: Diagnosis present

## 2023-12-16 DIAGNOSIS — I7300 Raynaud's syndrome without gangrene: Secondary | ICD-10-CM | POA: Diagnosis present

## 2023-12-16 DIAGNOSIS — K219 Gastro-esophageal reflux disease without esophagitis: Secondary | ICD-10-CM

## 2023-12-16 DIAGNOSIS — M3500 Sicca syndrome, unspecified: Secondary | ICD-10-CM | POA: Diagnosis present

## 2023-12-16 DIAGNOSIS — I495 Sick sinus syndrome: Secondary | ICD-10-CM | POA: Diagnosis not present

## 2023-12-16 DIAGNOSIS — E872 Acidosis, unspecified: Secondary | ICD-10-CM | POA: Diagnosis present

## 2023-12-16 DIAGNOSIS — J9811 Atelectasis: Secondary | ICD-10-CM | POA: Diagnosis present

## 2023-12-16 DIAGNOSIS — J9601 Acute respiratory failure with hypoxia: Secondary | ICD-10-CM | POA: Diagnosis present

## 2023-12-16 DIAGNOSIS — Z1152 Encounter for screening for COVID-19: Secondary | ICD-10-CM | POA: Diagnosis not present

## 2023-12-16 DIAGNOSIS — K222 Esophageal obstruction: Secondary | ICD-10-CM | POA: Diagnosis present

## 2023-12-16 DIAGNOSIS — R1319 Other dysphagia: Secondary | ICD-10-CM

## 2023-12-16 DIAGNOSIS — R569 Unspecified convulsions: Secondary | ICD-10-CM | POA: Diagnosis present

## 2023-12-16 DIAGNOSIS — E875 Hyperkalemia: Secondary | ICD-10-CM | POA: Diagnosis present

## 2023-12-16 DIAGNOSIS — R0602 Shortness of breath: Secondary | ICD-10-CM | POA: Diagnosis not present

## 2023-12-16 LAB — BASIC METABOLIC PANEL WITH GFR
Anion gap: 10 (ref 5–15)
BUN: 8 mg/dL (ref 8–23)
CO2: 25 mmol/L (ref 22–32)
Calcium: 8.8 mg/dL — ABNORMAL LOW (ref 8.9–10.3)
Chloride: 103 mmol/L (ref 98–111)
Creatinine, Ser: 0.66 mg/dL (ref 0.44–1.00)
GFR, Estimated: >60 mL/min (ref >60)
Glucose, Bld: 109 mg/dL — ABNORMAL HIGH (ref 70–99)
Potassium: 3.8 mmol/L (ref 3.5–5.1)
Sodium: 138 mmol/L (ref 135–145)

## 2023-12-16 LAB — ECHOCARDIOGRAM COMPLETE
AR max vel: 1.86 cm2
AV Area VTI: 1.73 cm2
AV Area mean vel: 1.78 cm2
AV Mean grad: 10.5 mmHg
AV Peak grad: 18.5 mmHg
Ao pk vel: 2.15 m/s
Area-P 1/2: 5.02 cm2
Calc EF: 50.3 %
Height: 64.5 in
MV M vel: 4.91 m/s
MV Peak grad: 96.4 mmHg
MV VTI: 1.95 cm2
S' Lateral: 3.4 cm
Single Plane A2C EF: 54.6 %
Single Plane A4C EF: 47.3 %
Weight: 2391.55 [oz_av]

## 2023-12-16 LAB — CBC
HCT: 35.3 % — ABNORMAL LOW (ref 36.0–46.0)
Hemoglobin: 11.6 g/dL — ABNORMAL LOW (ref 12.0–15.0)
MCH: 31.3 pg (ref 26.0–34.0)
MCHC: 32.9 g/dL (ref 30.0–36.0)
MCV: 95.1 fL (ref 80.0–100.0)
Platelets: 246 K/uL (ref 150–400)
RBC: 3.71 MIL/uL — ABNORMAL LOW (ref 3.87–5.11)
RDW: 14.3 % (ref 11.5–15.5)
WBC: 10.8 K/uL — ABNORMAL HIGH (ref 4.0–10.5)
nRBC: 0 % (ref 0.0–0.2)

## 2023-12-16 LAB — RESPIRATORY PANEL BY PCR

## 2023-12-16 LAB — URINE CULTURE: Culture: NO GROWTH

## 2023-12-16 MED ORDER — INFLUENZA VAC SPLIT HIGH-DOSE 0.5 ML IM SUSY
0.5000 mL | PREFILLED_SYRINGE | INTRAMUSCULAR | Status: DC
  Filled 2023-12-16: qty 0.5

## 2023-12-16 MED ORDER — LIDOCAINE VISCOUS HCL 2 % MT SOLN
15.0000 mL | Freq: Once | OROMUCOSAL | Status: AC
  Administered 2023-12-16: 15 mL via ORAL
  Filled 2023-12-16: qty 15

## 2023-12-16 MED ORDER — MORPHINE SULFATE (PF) 2 MG/ML IV SOLN
2.0000 mg | Freq: Once | INTRAVENOUS | Status: AC
  Administered 2023-12-16: 2 mg via INTRAVENOUS
  Filled 2023-12-16: qty 1

## 2023-12-16 MED ORDER — ALUM & MAG HYDROXIDE-SIMETH 200-200-20 MG/5ML PO SUSP
30.0000 mL | Freq: Once | ORAL | Status: AC
  Administered 2023-12-16: 30 mL via ORAL
  Filled 2023-12-16: qty 30

## 2023-12-16 MED ORDER — SODIUM CHLORIDE 0.9 % IV SOLN
1.0000 g | INTRAVENOUS | Status: DC
  Administered 2023-12-16 – 2023-12-17 (×2): 1 g via INTRAVENOUS
  Filled 2023-12-16 (×2): qty 10

## 2023-12-16 MED ORDER — SODIUM CHLORIDE 0.9 % IV SOLN: INTRAVENOUS | Status: AC

## 2023-12-16 MED ORDER — PERFLUTREN LIPID MICROSPHERE
1.0000 mL | INTRAVENOUS | Status: AC | PRN
  Administered 2023-12-16: 2 mL via INTRAVENOUS

## 2023-12-16 MED ORDER — PANTOPRAZOLE SODIUM 40 MG IV SOLR
40.0000 mg | Freq: Two times a day (BID) | INTRAVENOUS | Status: DC
  Administered 2023-12-16 – 2023-12-18 (×5): 40 mg via INTRAVENOUS
  Filled 2023-12-16 (×5): qty 10

## 2023-12-16 NOTE — Evaluation (Signed)
 Physical Therapy Evaluation Patient Details Name: Christy Moore MRN: [id number] DOB: 1945/07/27 Today's Date: 12/16/2023  History of Present Illness  78 yo female admitted with chest pain, SIRS. Hx of Sjogren's, RA, ILD, cardiomegaly, esophageal stricture  Clinical Impression  On eval, pt required Min A for safe mobility on today. She was quite drowsy-suspect from pain meds. Assisted pt with ambulating to/from bathroom. Pt was unsteady-furniture walking to steady herself during walk. Assisted pt back to bed at her request. She reported 8/10 chest pain during session. HR 118 bpm, O2 91% on RA. Instructed pt to rest to allow for pain to lessen-if still painful in a bit, speak with RN. Anticipate pt will mobilize well when not quite so drowsy. No follow up PT recommendation at this time-will continue to assess and update as needed.         If plan is discharge home, recommend the following: A little help with walking and/or transfers;A little help with bathing/dressing/bathroom;Assistance with cooking/housework;Assist for transportation;Help with stairs or ramp for entrance   Can travel by private vehicle        Equipment Recommendations None recommended by PT  Recommendations for Other Services       Functional Status Assessment       Precautions / Restrictions Precautions Precautions: Fall Restrictions Weight Bearing Restrictions Per Provider Order: No      Mobility  Bed Mobility Overal bed mobility: Needs Assistance Bed Mobility: Supine to Sit, Sit to Supine     Supine to sit: Supervision, HOB elevated Sit to supine: Supervision, HOB elevated   General bed mobility comments: Increased time due to drowsiness. Supv for safety, lines    Transfers Overall transfer level: Needs assistance   Transfers: Sit to/from Stand Sit to Stand: Contact guard assist           General transfer comment: Increased time. CGA for safety due to drowsiness.     Ambulation/Gait Ambulation/Gait assistance: Min assist Gait Distance (Feet): 12 Feet (x2) Assistive device:  (furniture walking) Gait Pattern/deviations: Step-through pattern, Decreased stride length       General Gait Details: Unsteady, pt reaching out for objects in environment to steady herself. Intermittent bumping into objects during walk to/from bathroom. Provided cues and Min A.  Stairs            Wheelchair Mobility     Tilt Bed    Modified Rankin (Stroke Patients Only)       Balance Overall balance assessment: Needs assistance         Standing balance support: During functional activity Standing balance-Leahy Scale: Fair                               Pertinent Vitals/Pain Pain Assessment Pain Assessment: 0-10 Pain Score: 8  Pain Location: chest pain Pain Descriptors / Indicators: Aching, Discomfort, Grimacing Pain Intervention(s): Limited activity within patient's tolerance, Monitored during session, Repositioned    Home Living Family/patient expects to be discharged to:: Private residence Living Arrangements: Spouse/significant other Available Help at Discharge: Family Type of Home: House Home Access: Stairs to enter Entrance Stairs-Rails: Doctor, General Practice of Steps: 3   Home Layout: One level Home Equipment: Agricultural Consultant (2 wheels);Cane - single point      Prior Function Prior Level of Function : Independent/Modified Independent             Mobility Comments: ind ADLs Comments: ind     Extremity/Trunk Assessment  Upper Extremity Assessment Upper Extremity Assessment: Overall WFL for tasks assessed    Lower Extremity Assessment Lower Extremity Assessment: Overall WFL for tasks assessed    Cervical / Trunk Assessment Cervical / Trunk Assessment: Normal  Communication   Communication Communication: No apparent difficulties    Cognition Arousal: Suspect due to medications (quite  drowsy) Behavior During Therapy: Flat affect   PT - Cognitive impairments: No apparent impairments                         Following commands: Intact       Cueing Cueing Techniques: Verbal cues     General Comments      Exercises     Assessment/Plan    PT Assessment Patient needs continued PT services  PT Problem List Decreased activity tolerance;Decreased balance;Decreased mobility;Pain       PT Treatment Interventions DME instruction;Gait training;Functional mobility training;Therapeutic activities;Therapeutic exercise;Patient/family education;Balance training    PT Goals (Current goals can be found in the Care Plan section)  Acute Rehab PT Goals Patient Stated Goal: less pain PT Goal Formulation: With patient/family Time For Goal Achievement: 12/30/23 Potential to Achieve Goals: Good    Frequency Min 3X/week     Co-evaluation               AM-PAC PT 6 Clicks Mobility  Outcome Measure Help needed turning from your back to your side while in a flat bed without using bedrails?: None Help needed moving from lying on your back to sitting on the side of a flat bed without using bedrails?: A Little Help needed moving to and from a bed to a chair (including a wheelchair)?: A Little Help needed standing up from a chair using your arms (e.g., wheelchair or bedside chair)?: A Little Help needed to walk in hospital room?: A Little Help needed climbing 3-5 steps with a railing? : A Lot 6 Click Score: 18    End of Session Equipment Utilized During Treatment: Gait belt Activity Tolerance:  (limited by drowsiness) Patient left: in bed;with call bell/phone within reach;with bed alarm set;with family/visitor present   PT Visit Diagnosis: Pain;Difficulty in walking, not elsewhere classified (R26.2);Unsteadiness on feet (R26.81) Pain - part of body:  (chest)    Time: 8489-8473 PT Time Calculation (min) (ACUTE ONLY): 16 min   Charges:   PT  Evaluation $PT Eval Low Complexity: 1 Low   PT General Charges $$ ACUTE PT VISIT: 1 Visit            Dannial SQUIBB, PT Acute Rehabilitation  Office: [phone]

## 2023-12-16 NOTE — Hospital Course (Signed)
 Christy Moore is a 78 y.o. female with medical history significant for Sjogren syndrome, rheumatoid arthritis, ILD, bronchiectasis, HTN, hypothyroidism, cardiomegaly, esophageal stricture s/p dilation who is admitted with chest pain meeting SIRS criteria and new hypoxia.

## 2023-12-16 NOTE — Progress Notes (Signed)
°  Echocardiogram 2D Echocardiogram has been performed.  Christy Moore 12/16/2023, 10:23 AM

## 2023-12-16 NOTE — Plan of Care (Signed)
°  Problem: Education: Goal: Knowledge of General Education information will improve Description: Including pain rating scale, medication(s)/side effects and non-pharmacologic comfort measures Outcome: Progressing   Problem: Clinical Measurements: Goal: Respiratory complications will improve Outcome: Progressing Goal: Cardiovascular complication will be avoided Outcome: Progressing   Problem: Clinical Measurements: Goal: Cardiovascular complication will be avoided Outcome: Progressing

## 2023-12-16 NOTE — Progress Notes (Addendum)
 PROGRESS NOTE  Christy Moore  FMW:[id number] DOB: 07-Jan-1946 DOA: 12/15/2023 PCP: Micheal Wolm LELON, MD   Brief Narrative: Patient is a 78 year old female with history of Sjogren syndrome, rheumatoid  arthritis, ILD, bronchiectasis, hypertension, hypothyroidism, cardiomegaly, esophageal stricture s/p dilatation, MGUS who presented with central chest pain radiating to her back while watching TV.  Also reported shortness of breath, cough and subjective fever at home.  And presentation, she was  in mild sinus tachycardia,  found to have a fever of 101.6, tachypneic, tachycardic, hypoxic and had to be placed on 2 L of oxygen.  Lab work showed potassium 5.5, lactate of 2.1.  COVID/flu/RSV negative.  UA not suspicious for UTI.  Blood cultures collected.  CT chest/abdomen/pelvis showed cardiomegaly, trace left pleural effusion, chronic bibasilar scarring/bronchiectasis, no evidence of PE.  Patient started on broad-spectrum antibiotics, IV fluids.  GI consulted today for suspicion of severe GERD/esophagitis   Assessment & Plan:  Principal Problem:   SIRS (systemic inflammatory response syndrome) (HCC) Active Problems:   Acute respiratory failure with hypoxia (HCC)   Hypothyroidism   Hypertension   Sjogren's disease  SIRS: Presented with fever, tachycardia, tachypnea.  Mild elevated lactate which has resolved.  No obvious infectious process identified.  COVID/flu/RSV negative.  CT chest/abdomen/pelvis unrevealing.  Currently on ceftriaxone .  Follow-up cultures.  Respiratory viral panel has been negative.  Currently afebrile  Acute hypoxic respiratory failure/history of bronchiectasis: Presented with hypoxia.  Was on 2 L of oxygen on admission but now on room air.  CTA chest negative for PE or pneumonia.  Showed trace left pleural effusion, chronic bibasilar scarring, bronchiectasis.  Could be exacerbation of bronchiectasis as well.  History of Sjogren syndrome.  Continue current antibiotic,  bronchodilators  Chest pain/epigastric pain/history of GERD/esophageal stricture: Atypical chest pain early related to GERD/musculoskeletal etiology.  Has history of reflux, esophageal stricture s/p dilatation.  Pain is mainly in the epigastric region, burning in nature.  Also has pain on the muscles of the neck.  Will continue Protonix .  Continue muscle relaxants troponins negative.  EKG did not show any ischemic findings.  CT chest was unrevealing.  Echo ordered.  GI consulted.  Hypertension: On amlodipine .  Blood pressure stable  Hyperkalemia: Resolved after given Lokelma .  Hypothyroidism: On Synthyroid  History of Sjogren's syndrome/RA: Home methotrexate  on hold  Debility/deconditioning: Will consult PT.         DVT prophylaxis:enoxaparin  (LOVENOX ) injection 40 mg Start: 12/16/23 1000     Code Status: Full Code  Family Communication: Husband at bedside on 12/10  Patient status:Obs  Patient is from :home  Anticipated discharge un:ynfz  Estimated DC date:1-2 days   Consultants: GI  Procedures:None  Antimicrobials:  Anti-infectives (From admission, onward)    Start     Dose/Rate Route Frequency Ordered Stop   12/16/23 0200  cefTRIAXone  (ROCEPHIN ) 1 g in sodium chloride  0.9 % 100 mL IVPB        1 g 200 mL/hr over 30 Minutes Intravenous Every 24 hours 12/16/23 0046     12/15/23 1745  ceFEPIme  (MAXIPIME ) 2 g in sodium chloride  0.9 % 100 mL IVPB        2 g 200 mL/hr over 30 Minutes Intravenous  Once 12/15/23 1734 12/15/23 1943   12/15/23 1745  metroNIDAZOLE  (FLAGYL ) IVPB 500 mg        500 mg 100 mL/hr over 60 Minutes Intravenous  Once 12/15/23 1734 12/15/23 2028   12/15/23 1745  vancomycin  (VANCOCIN ) IVPB 1000 mg/200 mL premix  1,000 mg 200 mL/hr over 60 Minutes Intravenous  Once 12/15/23 1734 12/15/23 2027       Subjective: Patient seen and examined at bedside today.  Hemodynamically stable.  On room air.  Complains of epigastric chest discomfort, pain  in the neck muscles.  No fever this morning.  On room air.  No nausea or vomiting.  Objective: Vitals:   12/15/23 2100 12/15/23 2154 12/15/23 2200 12/16/23 0515  BP: 115/67 118/61  (!) 114/59  Pulse: 91 96  78  Resp: (!) 23 18  18   Temp:  98.9 F (37.2 C)  98.8 F (37.1 C)  TempSrc:  Oral    SpO2: 95% 98%  96%  Weight:   67.8 kg   Height:   5' 4.5 (1.638 m)     Intake/Output Summary (Last 24 hours) at 12/16/2023 0748 Last data filed at 12/16/2023 0500 Gross per 24 hour  Intake 1679.99 ml  Output --  Net 1679.99 ml   Filed Weights   12/15/23 1418 12/15/23 2200  Weight: 65.5 kg 67.8 kg    Examination:  General exam: Overall comfortable, not in distress, pleasant elderly female HEENT: PERRL Respiratory system: Fine bibasilar crackles Cardiovascular system: S1 & S2 heard, RRR.  Gastrointestinal system: Abdomen is nondistended, soft and nontender. Central nervous system: Alert and oriented Extremities: No edema, no clubbing ,no cyanosis Skin: No rashes, no ulcers,no icterus     Data Reviewed: I have personally reviewed following labs and imaging studies  CBC: Recent Labs  Lab 12/15/23 1516 12/16/23 0417  WBC 10.8* 10.8*  HGB 13.8 11.6*  HCT 41.1 35.3*  MCV 93.4 95.1  PLT 255 246   Basic Metabolic Panel: Recent Labs  Lab 12/15/23 1516 12/16/23 0417  NA 138 138  K 5.5* 3.8  CL 100 103  CO2 27 25  GLUCOSE 153* 109*  BUN 11 8  CREATININE 0.89 0.66  CALCIUM 10.2 8.8*     Recent Results (from the past 240 hours)  Resp panel by RT-PCR (RSV, Flu A&B, Covid) Urine, Clean Catch     Status: None   Collection Time: 12/15/23  5:37 PM   Specimen: Urine, Clean Catch; Nasal Swab  Result Value Ref Range Status   SARS Coronavirus 2 by RT PCR NEGATIVE NEGATIVE Final    Comment: (NOTE) SARS-CoV-2 target nucleic acids are NOT DETECTED.  The SARS-CoV-2 RNA is generally detectable in upper respiratory specimens during the acute phase of infection. The  lowest concentration of SARS-CoV-2 viral copies this assay can detect is 138 copies/mL. A negative result does not preclude SARS-Cov-2 infection and should not be used as the sole basis for treatment or other patient management decisions. A negative result may occur with  improper specimen collection/handling, submission of specimen other than nasopharyngeal swab, presence of viral mutation(s) within the areas targeted by this assay, and inadequate number of viral copies(<138 copies/mL). A negative result must be combined with clinical observations, patient history, and epidemiological information. The expected result is Negative.  Fact Sheet for Patients:  bloggercourse.com  Fact Sheet for Healthcare Providers:  seriousbroker.it  This test is no t yet approved or cleared by the [place]  FDA and  has been authorized for detection and/or diagnosis of SARS-CoV-2 by FDA under an Emergency Use Authorization (EUA). This EUA will remain  in effect (meaning this test can be used) for the duration of the COVID-19 declaration under Section 564(b)(1) of the Act, 21 U.S.C.section 360bbb-3(b)(1), unless the authorization is terminated  or revoked sooner.  Influenza A by PCR NEGATIVE NEGATIVE Final   Influenza B by PCR NEGATIVE NEGATIVE Final    Comment: (NOTE) The Xpert Xpress SARS-CoV-2/FLU/RSV plus assay is intended as an aid in the diagnosis of influenza from Nasopharyngeal swab specimens and should not be used as a sole basis for treatment. Nasal washings and aspirates are unacceptable for Xpert Xpress SARS-CoV-2/FLU/RSV testing.  Fact Sheet for Patients: bloggercourse.com  Fact Sheet for Healthcare Providers: seriousbroker.it  This test is not yet approved or cleared by the [place]  FDA and has been authorized for detection and/or diagnosis of SARS-CoV-2 by FDA under  an Emergency Use Authorization (EUA). This EUA will remain in effect (meaning this test can be used) for the duration of the COVID-19 declaration under Section 564(b)(1) of the Act, 21 U.S.C. section 360bbb-3(b)(1), unless the authorization is terminated or revoked.     Resp Syncytial Virus by PCR NEGATIVE NEGATIVE Final    Comment: (NOTE) Fact Sheet for Patients: bloggercourse.com  Fact Sheet for Healthcare Providers: seriousbroker.it  This test is not yet approved or cleared by the [place]  FDA and has been authorized for detection and/or diagnosis of SARS-CoV-2 by FDA under an Emergency Use Authorization (EUA). This EUA will remain in effect (meaning this test can be used) for the duration of the COVID-19 declaration under Section 564(b)(1) of the Act, 21 U.S.C. section 360bbb-3(b)(1), unless the authorization is terminated or revoked.  Performed at [facility], [address] [place], [place] [address]   Respiratory (~20 pathogens) panel by PCR     Status: None   Collection Time: 12/15/23 11:00 PM   Specimen: Nasopharyngeal Swab; Respiratory  Result Value Ref Range Status   Adenovirus NOT DETECTED NOT DETECTED Final   Coronavirus 229E NOT DETECTED NOT DETECTED Final    Comment: (NOTE) The Coronavirus on the Respiratory Panel, DOES NOT test for the novel  Coronavirus (2019 nCoV)    Coronavirus HKU1 NOT DETECTED NOT DETECTED Final   Coronavirus NL63 NOT DETECTED NOT DETECTED Final   Coronavirus OC43 NOT DETECTED NOT DETECTED Final   Metapneumovirus NOT DETECTED NOT DETECTED Final   Rhinovirus / Enterovirus NOT DETECTED NOT DETECTED Final   Influenza A NOT DETECTED NOT DETECTED Final   Influenza B NOT DETECTED NOT DETECTED Final   Parainfluenza Virus 1 NOT DETECTED NOT DETECTED Final   Parainfluenza Virus 2 NOT DETECTED NOT DETECTED Final   Parainfluenza Virus 3 NOT DETECTED NOT DETECTED Final    Parainfluenza Virus 4 NOT DETECTED NOT DETECTED Final   Respiratory Syncytial Virus NOT DETECTED NOT DETECTED Final   Bordetella pertussis NOT DETECTED NOT DETECTED Final   Bordetella Parapertussis NOT DETECTED NOT DETECTED Final   Chlamydophila pneumoniae NOT DETECTED NOT DETECTED Final   Mycoplasma pneumoniae NOT DETECTED NOT DETECTED Final    Comment: Performed at [facility], [address] [place], [place] [address]     Radiology Studies: CT Angio Chest/Abd/Pel for Dissection W and/or Wo Contrast Result Date: 12/15/2023 CLINICAL DATA:  Chest pain radiating to back and throat, nausea since this morning, difficulty breathing EXAM: CT ANGIOGRAPHY CHEST, ABDOMEN AND PELVIS TECHNIQUE: Non-contrast CT of the chest was initially obtained. Multidetector CT imaging through the chest, abdomen and pelvis was performed using the standard protocol during bolus administration of intravenous contrast. Multiplanar reconstructed images and MIPs were obtained and reviewed to evaluate the vascular anatomy. RADIATION DOSE REDUCTION: This exam was performed according to the departmental dose-optimization program which includes automated exposure control, adjustment of the  mA and/or kV according to patient size and/or use of iterative reconstruction technique. CONTRAST:  OMNIPAQUE  IOHEXOL  350 MG/ML SOLN COMPARISON:  12/15/2023, 07/01/2023 FINDINGS: CTA CHEST FINDINGS Cardiovascular: No evidence of thoracic aortic aneurysm or dissection. Atherosclerosis of the thoracic aorta. Average origin of the right subclavian artery is noted, a frequent anatomic variant. The heart is enlarged, without pericardial effusion. There is technically adequate opacification of the pulmonary vasculature, with no filling defects or pulmonary emboli identified. Mediastinum/Nodes: No enlarged mediastinal, hilar, or axillary lymph nodes. Thyroid  gland, trachea, and esophagus demonstrate no significant findings. Lungs/Pleura: There is  a trace left pleural effusion. Stable bibasilar scarring, left greater than right. No acute airspace disease. No pneumothorax. Mild bilateral lower lobe bronchiectasis. Central airways are patent. Musculoskeletal: No acute or destructive bony abnormalities. Reconstructed images demonstrate no additional findings. Review of the MIP images confirms the above findings. CTA ABDOMEN AND PELVIS FINDINGS VASCULAR Aorta: Normal caliber aorta without aneurysm, dissection, vasculitis or significant stenosis. Mild atherosclerosis. Celiac: Patent without evidence of aneurysm, dissection, vasculitis or significant stenosis. SMA: Patent without evidence of aneurysm, dissection, vasculitis or significant stenosis. Renals: Both renal arteries are patent without evidence of aneurysm, dissection, vasculitis, fibromuscular dysplasia or significant stenosis. IMA: Patent without evidence of aneurysm, dissection, vasculitis or significant stenosis. Inflow: Patent without evidence of aneurysm, dissection, vasculitis or significant stenosis. Veins: No obvious venous abnormality within the limitations of this arterial phase study. Review of the MIP images confirms the above findings. NON-VASCULAR Hepatobiliary: No focal liver abnormality is seen. No gallstones, gallbladder wall thickening, or biliary dilatation. Pancreas: Unremarkable. No pancreatic ductal dilatation or surrounding inflammatory changes. Spleen: Normal in size without focal abnormality. Adrenals/Urinary Tract: Adrenal glands are unremarkable. Kidneys are normal, without renal calculi, focal lesion, or hydronephrosis. Bladder is unremarkable. Stomach/Bowel: No bowel obstruction or ileus. Normal appendix right lower quadrant. No bowel wall thickening or inflammatory change. Lymphatic: No pathologic adenopathy within the abdomen or pelvis. Reproductive: Uterus and bilateral adnexa are unremarkable. Other: No free fluid or free intraperitoneal gas. Small fat containing umbilical  hernia. No bowel herniation. Musculoskeletal: No acute or destructive bony abnormalities. Reconstructed images demonstrate no additional findings. Review of the MIP images confirms the above findings. IMPRESSION: Vascular: 1. No evidence of thoracoabdominal aortic aneurysm or dissection. 2. No evidence of pulmonary embolus. 3.  Aortic Atherosclerosis (ICD10-I70.0). Nonvascular: 1. Cardiomegaly. 2. Trace left pleural effusion. 3. Chronic bibasilar scarring and bronchiectasis. 4. No acute intra-abdominal or intrapelvic process. Electronically Signed   By: Ozell Daring M.D.   On: 12/15/2023 17:32   DG Chest 2 View Result Date: 12/15/2023 EXAM: 2 VIEW(S) XRAY OF THE CHEST 12/15/2023 02:44:25 PM COMPARISON: 05/17/2023 CLINICAL HISTORY: CP FINDINGS: LUNGS AND PLEURA: Linear scarring at right lung base. Asymmetric patchy opacities at left lung base. Possible small left pleural effusion. No pneumothorax. HEART AND MEDIASTINUM: Atherosclerotic aortic calcifications. No acute abnormality of the cardiac and mediastinal silhouettes. BONES AND SOFT TISSUES: No acute osseous abnormality. IMPRESSION: 1. Asymmetric patchy opacities at the left lung base with a small left pleural effusion is suspected. Electronically signed by: Norleen Boxer MD 12/15/2023 03:42 PM EST RP Workstation: [id number]    Scheduled Meds:  amLODipine   5 mg Oral Daily   enoxaparin  (LOVENOX ) injection  40 mg Subcutaneous Q24H   folic acid   2 mg Oral Daily   [START ON 12/17/2023] Influenza vac split trivalent PF  0.5 mL Intramuscular Tomorrow-1000   levothyroxine   100 mcg Oral Q0600   pantoprazole  (PROTONIX ) IV  40 mg Intravenous  Q12H   sodium chloride  flush  3 mL Intravenous Q12H   Continuous Infusions:  cefTRIAXone  (ROCEPHIN )  IV 1 g (12/16/23 0325)     LOS: 0 days   Ivonne Mustache, MD [facility] P12/10/2023, 7:48 AM

## 2023-12-16 NOTE — Consult Note (Addendum)
 Consultation  Referring Provider: Dr. Jillian     Primary Care Physician:  Micheal Wolm ORN, MD Primary Gastroenterologist: Assigned to Dr. Shila last office visit       Reason for Consultation: Esophagitis/GERD            HPI:   Christy Moore is a 78 y.o. female with a past medical history as listed below including Sjogren syndrome, rheumatoid arthritis, ILD, bronchiectasis, hypertension, hypothyroidism, cardiomegaly and esophageal stricture status post dilation as well as MGUS who presented to the ED for evaluation of chest pain.    05/20/2023 patient seen by PCP for chronic cough and concern for Sjogren syndrome and lung involvement.  Also with recurrent dysphagia in the setting of chronic GERD.    12/01/2023 patient seen by PCP for a 2-week history of postnasal drainage and sore throat.  Described reddish-brown and occasional yellow-tinged mucus frequently.  Was doing salt water gargles and taking Tylenol .  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  Requesting low-dose prednisone  5 mg daily to help with pain.  Plan at that time was to continue salt water gargles, good hydration, Mucinex 1200 mg twice daily and started on low-dose Prednisone  5 mg once daily for 10 days for intermittent muscle spasms.    At time of presentation patient reported central chest pain which felt like it was radiating straight to her back, had began that morning around 830 after waking up and watching TV.  Worse when she leaned forward.  Described chronic issues with swallowing related to Sjogren syndrome.  Some shortness of breath and cough when she took a deep breath but not changed from baseline.  Subjective fevers and a recorded fever in the ED up to 101.6.    Today, patient is seen with her husband by her bedside.  She reports a long history of heartburn and reflux symptoms and two  esophageal dilations in the past.  Tells me that interestingly back in May she was treated for 2 weeks with Doxycycline  for an ongoing pneumonia and after that many of her heartburn and indigestion symptoms did not seem as bad.  She has continued with difficulty swallowing though, typically every meal she will cough and strangle at some point, per her husband this can be with solid food or just water.  Rice and bread seem to be worse.  She has to cut her food into small pieces in order to get it down.  She tells me she believes her Sjogren's is playing a role as well as everything is dry in there.  She tries to drink a lot of water before a meal and while she is eating but often still ends up coughing.    What brought her into the hospital is a chest pain that feels almost like a spasm, she tells me it is always there in the middle of her chest but sometimes it squeezes and feels like a hot cramp.  Bending over forward makes this feel worse and she tells me then her esophagus feels stiff and even paralyzed.  Interestingly when she swallows she does not have any discomfort with that other than just the difiiculty of getting it down.  No significant weight loss.  Bowel movements are normal.    Denies fever, chills, nausea, vomiting or symptoms that awaken her from sleep.  ED course: BP 130/71, pulse 105, respiratory rate 17, temp 98.8, developed  a fever up to 101.6 and tachypneic with respiratory rate 25-30 and tachycardic with pulse up to the 120s, found to be hypoxic at 87% while on room air and was placed on 2 L of supplemental O2 via nasal cannula with improvement.  Labs showed a white count of 10.8, hemoglobin 13.8, platelets 2055, sodium 138, potassium 5.5, bicarb 27, BUN 11, creatinine 0.9, serum glucose 153, troponin T less than 15 x 2, lactic acid 2.1> 1.7, COVID, flu and RSV negative, UA negative for UTI, blood culture in process; CTA chest and abdomen/pelvis showed cardiomegaly, trace left pleural  effusion, chronic bibasilar scarring and bronchiectasis, no evidence of PE, thoracoabdominal aortic dissection no intra-abdominal or intrapelvic process identified-given 2 L lactated Ringer 's, IV vancomycin , cefepime  and Flagyl  as well as oral Lokelma  5 g, IV morphine  and fentanyl   GI history: 06/18/2023 office visit with Lauraine Furbish, PA for complaint of dysphagia.  At that time discussed recently being diagnosed with chronic pneumonia and prescribed Doxycycline  for 2 weeks, completed the full course and since then had not had any acid reflux, heartburn regurgitation or dysphagia.  Prior to that had frequent symptoms.  At that time no issues in the past 3 weeks.  Awaiting pulmonology workup for pulmonary function test and a CT of the chest.  Wanted to hold off on evaluation of dysphagia and GERD.  Plan: Restart Famotidine  if recurrence of GERD symptoms.  If recurrence of dysphagia consider EGD with dilation. 08/13/2020 colonoscopy with no polyps,: Randomly biopsied due to recent diarrheal illness and biopsies normal 08/2020 EGD after establishing care with Dr. Teressa found to have mild nonspecific gastritis, biopsies normal 2012 EGD with Dr. Luis with repeat dilation and good relief of symptoms 2004 EGD with Dr. Avram for a benign GE junction stricture treated with [facility] dilator  Previous GI Procedures/Imaging    EGD 08/13/2020 - Non- specific gastritis. Biopsied to check for H. pylori.  - The examination was otherwise normal.  - Biopsies were taken with a cold forceps from the esopahgus to check for Eosinophilic Esophagitis. Path: 2. Surgical [P], gastric - MILD CHRONIC GASTRITIS WITHOUT ACTIVITY - NO INTESTINAL METAPLASIA IDENTIFIED - SEE COMMENT 3. Surgical [P], distal esophagus - REACTIVE SQUAMOUS MUCOSA WITH INCREASED INTRAEPITHELIAL LYMPHOCYTES AND RARE EOSINOPHILS - SEE COMMENT 4. Surgical [P], proximal esophagus - REACTIVE SQUAMOUS MUCOSA WITH INCREASED INTRAEPITHELIAL LYMPHOCYTES AND  RARE EOSINOPHILS - SEE COMMENT *Negative for H. pylori   Colonoscopy 08/13/2020 - The examined portion of the ileum was normal.  - Diverticulosis in the left colon.  - The examination was otherwise normal on direct and retroflexion views.  - Biopsies were taken with a cold forceps from the entire colon for evaluation of microscopic colitis. Path: 1. Surgical [P], colon nos, random sites - BENIGN COLONIC MUCOSA - NO ACTIVE INFLAMMATION OR EVIDENCE OF MICROSCOPIC COLITIS - NO HIGH GRADE DYSPLASIA OR MALIGNANCY IDENTIFIED   EGD 10/21/2002 - Esophageal stricture in distal esophagus, dilated  Past Medical History:  Diagnosis Date   Arthritis    Cardiomegaly    Chronic pneumonia    Depression    GERD (gastroesophageal reflux disease)    Hypertension    Hypothyroidism    Raynaud disease    Seizures (HCC)    Sinus tachycardia    Sjogren's syndrome    Status post dilation of esophageal narrowing 2012   Dr. Luis    Past Surgical History:  Procedure Laterality Date   BASAL CELL CARCINOMA EXCISION  2024   CATARACT EXTRACTION, BILATERAL  2024   HAND TENDON SURGERY  02/09/2007   tendon transfer-Dr. Camella   NASAL SEPTUM SURGERY     operated on by Dr. Floy 20 years ago   OVARIAN CYST REMOVAL     Right Sided Salpingo-oopherectomy   SALPINGOOPHORECTOMY     WISDOM TOOTH EXTRACTION      Family History  Problem Relation Age of Onset   Cancer Mother        breast and lung   Stroke Mother 78   Hypothyroidism Mother    Cancer Father        prostate   Heart attack Father 27   Alcohol abuse Maternal Grandmother    Alcohol abuse Maternal Grandfather    Allergies Other        Grandmother-pt states she takes after grandmother who had multiple allergies   Colon cancer Neg Hx    Esophageal cancer Neg Hx    Stomach cancer Neg Hx    Rectal cancer Neg Hx      Social History   Tobacco Use   Smoking status: Never    Passive exposure: Past   Smokeless tobacco: Never  Vaping  Use   Vaping status: Never Used  Substance Use Topics   Alcohol use: Not Currently    Comment: rarely   Drug use: No    Prior to Admission medications   Medication Sig Start Date End Date Taking? Authorizing Provider  acetaminophen  (TYLENOL ) 500 MG tablet Take 500 mg by mouth as needed.   Yes [provider]  albuterol  (VENTOLIN  HFA) 108 (90 Base) MCG/ACT inhaler Inhale 1-2 puffs into the lungs every 4 (four) hours as needed for wheezing or shortness of breath.   Yes [provider]  amLODipine  (NORVASC ) 5 MG tablet TAKE 1 TABLET (5 MG TOTAL) BY MOUTH DAILY. 10/12/23  Yes Burchette, Wolm ORN, MD  cyclobenzaprine  (FLEXERIL ) 10 MG tablet Take 1 tablet (10 mg total) by mouth 3 (three) times daily as needed for muscle spasms. 08/28/23  Yes Johnny Garnette LABOR, MD  famotidine  (PEPCID ) 40 MG tablet TAKE 1 TABLET BY MOUTH EVERYDAY AT BEDTIME 10/12/23  Yes Burchette, Wolm ORN, MD  fluticasone  (FLONASE ) 50 MCG/ACT nasal spray Place 2 sprays into both nostrils daily as needed for allergies.   Yes [provider]  folic acid  (FOLVITE ) 1 MG tablet Take 2 tablets (2 mg total) by mouth daily. 07/22/23  Yes Deveshwar, Maya, MD  levothyroxine  (SYNTHROID ) 100 MCG tablet TAKE 1 TABLET BY MOUTH EVERY DAY 12/09/23  Yes Burchette, Wolm ORN, MD  methotrexate  (RHEUMATREX) 2.5 MG tablet Take 5 tablets (12.5 mg total) by mouth once a week. Patient taking differently: Take 15 mg by mouth once a week. 11/30/23  Yes Cheryl Waddell HERO, PA-C  predniSONE  (DELTASONE ) 5 MG tablet Take one tablet by mouth once daily for 10 days. Patient taking differently: Take 5 mg by mouth daily as needed. 12/01/23  Yes Burchette, Wolm ORN, MD    Current Facility-Administered Medications  Medication Dose Route Frequency Provider Last Rate Last Admin   acetaminophen  (TYLENOL ) tablet 650 mg  650 mg Oral Q6H PRN Patel, Vishal R, MD       Or   acetaminophen  (TYLENOL ) suppository 650 mg  650 mg Rectal Q6H PRN Patel, Vishal R, MD        albuterol  (PROVENTIL ) (2.5 MG/3ML) 0.083% nebulizer solution 3 mL  3 mL Inhalation Q4H PRN Patel, Vishal R, MD       amLODipine  (NORVASC ) tablet 5 mg  5  mg Oral Daily Tobie Jorie SAUNDERS, MD       cefTRIAXone  (ROCEPHIN ) 1 g in sodium chloride  0.9 % 100 mL IVPB  1 g Intravenous Q24H Patel, Vishal R, MD 200 mL/hr at 12/16/23 0325 1 g at 12/16/23 0325   cyclobenzaprine  (FLEXERIL ) tablet 10 mg  10 mg Oral TID PRN Tobie Jorie SAUNDERS, MD   10 mg at 12/16/23 9394   enoxaparin  (LOVENOX ) injection 40 mg  40 mg Subcutaneous Q24H Tobie Jorie SAUNDERS, MD       folic acid  (FOLVITE ) tablet 2 mg  2 mg Oral Daily Tobie Jorie SAUNDERS, MD       [START ON 12/17/2023] Influenza vac split trivalent PF (FLUZONE HIGH-DOSE) injection 0.5 mL  0.5 mL Intramuscular Tomorrow-1000 Tobie Jorie SAUNDERS, MD       levothyroxine  (SYNTHROID ) tablet 100 mcg  100 mcg Oral Q0600 Patel, Vishal R, MD   100 mcg at 12/16/23 0601   morphine  (PF) 2 MG/ML injection 2 mg  2 mg Intravenous Q4H PRN Patel, Vishal R, MD   2 mg at 12/16/23 0330   nitroGLYCERIN  (NITROSTAT ) SL tablet 0.4 mg  0.4 mg Sublingual Q5 min PRN Tobie Jorie SAUNDERS, MD       ondansetron  (ZOFRAN ) tablet 4 mg  4 mg Oral Q6H PRN Tobie Jorie SAUNDERS, MD       Or   ondansetron  (ZOFRAN ) injection 4 mg  4 mg Intravenous Q6H PRN Tobie Jorie SAUNDERS, MD       pantoprazole  (PROTONIX ) injection 40 mg  40 mg Intravenous Q12H Tobie Jorie SAUNDERS, MD       senna-docusate (Senokot-S) tablet 1 tablet  1 tablet Oral QHS PRN Tobie Jorie SAUNDERS, MD       sodium chloride  flush (NS) 0.9 % injection 3 mL  3 mL Intravenous Q12H Tobie Jorie R, MD   3 mL at 12/16/23 0018    Allergies as of 12/15/2023 - Reviewed 12/15/2023  Allergen Reaction Noted   Albuterol -budesonide Other (See Comments) 06/18/2023   Amoxicillin -pot clavulanate  12/20/2007   Azithromycin   12/20/2007   Broccoli [brassica oleracea]  08/25/2023   Celecoxib Other (See Comments) 12/20/2007   Ciprofloxacin Other (See Comments) 12/20/2007   Doxycycline  Other  (See Comments) 12/20/2007   Erythromycin Other (See Comments) 12/20/2007   Hydrocodone Other (See Comments) 12/20/2007   Hydrocodone-acetaminophen   12/20/2007   Levofloxacin Other (See Comments) 12/20/2007   Omeprazole  Nausea And Vomiting and Other (See Comments) 12/14/2020   Penicillins  10/03/2018   Propoxyphene n-acetaminophen   12/20/2007   Rofecoxib  12/20/2007   Sulfa antibiotics  10/03/2018   Tetanus toxoid  12/20/2007     Review of Systems:    Constitutional: No weight loss, fever or chills Skin: No rash Cardiovascular: See HPI Respiratory: No SOB Gastrointestinal: See HPI and otherwise negative Genitourinary: No dysuria Neurological: No headache, dizziness or syncope Musculoskeletal: No new muscle or joint pain Hematologic: No bleeding  Psychiatric: No history of depression or anxiety    Physical Exam:  Vital signs in last 24 hours: Temp:  [98.8 F (37.1 C)-101.6 F (38.7 C)] 98.8 F (37.1 C) (12/10 0515) Pulse Rate:  [78-120] 78 (12/10 0515) Resp:  [16-31] 18 (12/10 0515) BP: (113-152)/(59-84) 114/59 (12/10 0515) SpO2:  [89 %-100 %] 96 % (12/10 0515) Weight:  [65.5 kg-67.8 kg] 67.8 kg (12/09 2200)   General:   Pleasant elderly Caucasian female appears to be in NAD, Well developed, Well nourished, alert and cooperative Head:  Normocephalic and atraumatic. Eyes:   PEERL,  EOMI. No icterus. Conjunctiva pink. Ears:  Normal auditory acuity. Neck:  Supple Throat: Oral cavity and pharynx without inflammation, swelling or lesion.  Lungs: Respirations even and unlabored. Lungs clear to auscultation bilaterally.   No wheezes, crackles, or rhonchi.  Heart: Normal S1, S2. No MRG. Regular rate and rhythm. No peripheral edema, cyanosis or pallor.  Abdomen:  Soft, nondistended, nontender. No rebound or guarding. Normal bowel sounds. No appreciable masses or hepatomegaly. Rectal:  Not performed.  Msk:  Symmetrical without gross deformities. Peripheral pulses intact.   Extremities:  Without edema, no deformity or joint abnormality.  Neurologic:  Alert and  oriented x4;  grossly normal neurologically.  Skin:   Dry and intact without significant lesions or rashes. Psychiatric: Demonstrates good judgement and reason without abnormal affect or behaviors.   LAB RESULTS: Recent Labs    12/15/23 1516 12/16/23 0417  WBC 10.8* 10.8*  HGB 13.8 11.6*  HCT 41.1 35.3*  PLT 255 246   BMET Recent Labs    12/15/23 1516 12/16/23 0417  NA 138 138  K 5.5* 3.8  CL 100 103  CO2 27 25  GLUCOSE 153* 109*  BUN 11 8  CREATININE 0.89 0.66  CALCIUM 10.2 8.8*   STUDIES: CT Angio Chest/Abd/Pel for Dissection W and/or Wo Contrast Result Date: 12/15/2023 CLINICAL DATA:  Chest pain radiating to back and throat, nausea since this morning, difficulty breathing EXAM: CT ANGIOGRAPHY CHEST, ABDOMEN AND PELVIS TECHNIQUE: Non-contrast CT of the chest was initially obtained. Multidetector CT imaging through the chest, abdomen and pelvis was performed using the standard protocol during bolus administration of intravenous contrast. Multiplanar reconstructed images and MIPs were obtained and reviewed to evaluate the vascular anatomy. RADIATION DOSE REDUCTION: This exam was performed according to the departmental dose-optimization program which includes automated exposure control, adjustment of the mA and/or kV according to patient size and/or use of iterative reconstruction technique. CONTRAST:  OMNIPAQUE  IOHEXOL  350 MG/ML SOLN COMPARISON:  12/15/2023, 07/01/2023 FINDINGS: CTA CHEST FINDINGS Cardiovascular: No evidence of thoracic aortic aneurysm or dissection. Atherosclerosis of the thoracic aorta. Average origin of the right subclavian artery is noted, a frequent anatomic variant. The heart is enlarged, without pericardial effusion. There is technically adequate opacification of the pulmonary vasculature, with no filling defects or pulmonary emboli identified. Mediastinum/Nodes: No  enlarged mediastinal, hilar, or axillary lymph nodes. Thyroid  gland, trachea, and esophagus demonstrate no significant findings. Lungs/Pleura: There is a trace left pleural effusion. Stable bibasilar scarring, left greater than right. No acute airspace disease. No pneumothorax. Mild bilateral lower lobe bronchiectasis. Central airways are patent. Musculoskeletal: No acute or destructive bony abnormalities. Reconstructed images demonstrate no additional findings. Review of the MIP images confirms the above findings. CTA ABDOMEN AND PELVIS FINDINGS VASCULAR Aorta: Normal caliber aorta without aneurysm, dissection, vasculitis or significant stenosis. Mild atherosclerosis. Celiac: Patent without evidence of aneurysm, dissection, vasculitis or significant stenosis. SMA: Patent without evidence of aneurysm, dissection, vasculitis or significant stenosis. Renals: Both renal arteries are patent without evidence of aneurysm, dissection, vasculitis, fibromuscular dysplasia or significant stenosis. IMA: Patent without evidence of aneurysm, dissection, vasculitis or significant stenosis. Inflow: Patent without evidence of aneurysm, dissection, vasculitis or significant stenosis. Veins: No obvious venous abnormality within the limitations of this arterial phase study. Review of the MIP images confirms the above findings. NON-VASCULAR Hepatobiliary: No focal liver abnormality is seen. No gallstones, gallbladder wall thickening, or biliary dilatation. Pancreas: Unremarkable. No pancreatic ductal dilatation or surrounding inflammatory changes. Spleen: Normal in size without focal abnormality. Adrenals/Urinary Tract:  Adrenal glands are unremarkable. Kidneys are normal, without renal calculi, focal lesion, or hydronephrosis. Bladder is unremarkable. Stomach/Bowel: No bowel obstruction or ileus. Normal appendix right lower quadrant. No bowel wall thickening or inflammatory change. Lymphatic: No pathologic adenopathy within the abdomen  or pelvis. Reproductive: Uterus and bilateral adnexa are unremarkable. Other: No free fluid or free intraperitoneal gas. Small fat containing umbilical hernia. No bowel herniation. Musculoskeletal: No acute or destructive bony abnormalities. Reconstructed images demonstrate no additional findings. Review of the MIP images confirms the above findings. IMPRESSION: Vascular: 1. No evidence of thoracoabdominal aortic aneurysm or dissection. 2. No evidence of pulmonary embolus. 3.  Aortic Atherosclerosis (ICD10-I70.0). Nonvascular: 1. Cardiomegaly. 2. Trace left pleural effusion. 3. Chronic bibasilar scarring and bronchiectasis. 4. No acute intra-abdominal or intrapelvic process. Electronically Signed   By: Ozell Daring M.D.   On: 12/15/2023 17:32   DG Chest 2 View Result Date: 12/15/2023 EXAM: 2 VIEW(S) XRAY OF THE CHEST 12/15/2023 02:44:25 PM COMPARISON: 05/17/2023 CLINICAL HISTORY: CP FINDINGS: LUNGS AND PLEURA: Linear scarring at right lung base. Asymmetric patchy opacities at left lung base. Possible small left pleural effusion. No pneumothorax. HEART AND MEDIASTINUM: Atherosclerotic aortic calcifications. No acute abnormality of the cardiac and mediastinal silhouettes. BONES AND SOFT TISSUES: No acute osseous abnormality. IMPRESSION: 1. Asymmetric patchy opacities at the left lung base with a small left pleural effusion is suspected. Electronically signed by: Norleen Boxer MD 12/15/2023 03:42 PM EST RP Workstation: [id number]    Impression / Plan:   Impression: 1.  Atypical chest pain: Complaint of central chest pain, troponins negative x 2, EKG not significantly changed from previous, CT of the chest unrevealing for acute ideology, cardiomegaly again noted, thought possibly GI in etiology in the setting of previous dysphagia and Sjogren syndrome, described as almost a spasm worsened by leaning forward, no Odynophagia, some dysphagia; consider relation to Sjogren's and dysmotility +/- spasm which can occur  in 30 to 60% of patients with Sjogren's +/- stricture +/- GERD +/- opportunistic infection given methotrexate  use 2.  SIRS: With tachycardia and fever, no obvious infectious process other than potentially exacerbation of bronchiectasis, COVID, flu and RSV negative, antibiotics narrowed to IV Ceftriaxone , WBC unchanged overnight, no further fevers 3.  Acute respiratory failure with hypoxia: Bronchiectasis, new hypoxia, SpO2 87% on room air in the ED and stable on 2 L O2 via nasal cannula, CTA chest negative for PE or consolidation, trace left pleural effusion and chronic bibasilar scarring and bronchiectasis, bibasilar inspiratory crackles on exam and otherwise lungs clear, currently on IV ceftriaxone  and albuterol , initially requiring supplemental O2, now on room air since this morning 4.  Sjogren syndrome/rheumatoid arthritis: Currently Methotrexate  is on hold  Plan: 1.  Continue Protonix  40 mg IV twice daily 2.  Continue antibiotics for SIRS likely due to bronchiectasis 3.  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  Patient scheduled for an EGD with dilation tomorrow with Dr.  Abran around 2:00 PM.  May benefit from esophageal manometry outpatient pending results. 4.  For now continue current diet.  Patient will be n.p.o. after midnight. 5.  Hold dose of Lovenox  tomorrow morning in preparation for procedure.  Nursing order placed.  Thank you for your kind consultation, we will continue to follow.  Delon Gibson [facility]  12/16/2023, 9:44 AM  GI ATTENDING  History, laboratories, x-rays, multiple endoscopy reports all personally reviewed.  The patient was seen, interviewed, and examined as outlined  above.  Agree with comprehensive consultation note as outlined above.  This is a patient with multiple problems, as outlined above, who presents with chest pain, fevers, and respiratory issues.  We are asked to see her  regarding noncardiac chest pain and dysphagia. A total time of 75 minutes was spent preparing to see this patient, seeing this patient, and plan.  The level of medical decision making was deemed highest in nature.  Impression: 1.  Atypical chest pain.  Features most consistent with musculoskeletal, based on history. 2.  GERD.  This can cause chest pain 3.  Dysphagia.  Intermittent solid food.  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  Almost certainly has underlying motility disorder (which can cause chest pain). 4.  Acute on chronic respiratory disease and fevers.  On antibiotics.  Being worked up Recommendations: 1.  Pantoprazole  40 mg daily.  I do believe this patient will benefit from regular PPI therapy. 2.  Schedule upper endoscopy with probable esophageal dilation.  This to address dysphagia and rule out other entities that could be contributing to her symptom complex and chest pain, like Candida esophagitis.  The patient is higher than baseline risk for endoscopy with sedation due to her comorbidities and age. 3.  Ongoing care of multiple other medical issues per primary Moore.  Norleen SAILOR. Abran Raddle., M.D. [facility] Division of Gastroenterology

## 2023-12-17 ENCOUNTER — Inpatient Hospital Stay (HOSPITAL_COMMUNITY): Admitting: Anesthesiology

## 2023-12-17 ENCOUNTER — Encounter (HOSPITAL_COMMUNITY)
Admission: EM | Disposition: A | Discharge status: Home / Self Care | Payer: Self-pay | Source: Home / Self Care | Attending: Internal Medicine

## 2023-12-17 ENCOUNTER — Inpatient Hospital Stay (HOSPITAL_COMMUNITY)

## 2023-12-17 DIAGNOSIS — K219 Gastro-esophageal reflux disease without esophagitis: Secondary | ICD-10-CM | POA: Diagnosis not present

## 2023-12-17 DIAGNOSIS — R6510 Systemic inflammatory response syndrome (SIRS) of non-infectious origin without acute organ dysfunction: Secondary | ICD-10-CM | POA: Diagnosis not present

## 2023-12-17 DIAGNOSIS — R0789 Other chest pain: Secondary | ICD-10-CM | POA: Diagnosis not present

## 2023-12-17 DIAGNOSIS — J9811 Atelectasis: Secondary | ICD-10-CM | POA: Diagnosis not present

## 2023-12-17 DIAGNOSIS — I4891 Unspecified atrial fibrillation: Secondary | ICD-10-CM

## 2023-12-17 DIAGNOSIS — I495 Sick sinus syndrome: Secondary | ICD-10-CM | POA: Diagnosis not present

## 2023-12-17 DIAGNOSIS — J90 Pleural effusion, not elsewhere classified: Secondary | ICD-10-CM | POA: Diagnosis not present

## 2023-12-17 DIAGNOSIS — R1319 Other dysphagia: Secondary | ICD-10-CM | POA: Diagnosis not present

## 2023-12-17 DIAGNOSIS — R0602 Shortness of breath: Secondary | ICD-10-CM | POA: Diagnosis not present

## 2023-12-17 DIAGNOSIS — R079 Chest pain, unspecified: Secondary | ICD-10-CM | POA: Diagnosis not present

## 2023-12-17 LAB — CBC
HCT: 38.2 % (ref 36.0–46.0)
Hemoglobin: 12.6 g/dL (ref 12.0–15.0)
MCH: 31.5 pg (ref 26.0–34.0)
MCHC: 33 g/dL (ref 30.0–36.0)
MCV: 95.5 fL (ref 80.0–100.0)
Platelets: 215 K/uL (ref 150–400)
RBC: 4 MIL/uL (ref 3.87–5.11)
RDW: 14.4 % (ref 11.5–15.5)
WBC: 21.2 K/uL — ABNORMAL HIGH (ref 4.0–10.5)
nRBC: 0 % (ref 0.0–0.2)

## 2023-12-17 LAB — GLUCOSE, CAPILLARY: Glucose-Capillary: 172 mg/dL — ABNORMAL HIGH (ref 70–99)

## 2023-12-17 LAB — BASIC METABOLIC PANEL WITH GFR
Anion gap: 14 (ref 5–15)
BUN: 9 mg/dL (ref 8–23)
CO2: 21 mmol/L — ABNORMAL LOW (ref 22–32)
Calcium: 8.7 mg/dL — ABNORMAL LOW (ref 8.9–10.3)
Chloride: 100 mmol/L (ref 98–111)
Creatinine, Ser: 0.85 mg/dL (ref 0.44–1.00)
GFR, Estimated: >60 mL/min (ref >60)
Glucose, Bld: 174 mg/dL — ABNORMAL HIGH (ref 70–99)
Potassium: 3.4 mmol/L — ABNORMAL LOW (ref 3.5–5.1)
Sodium: 134 mmol/L — ABNORMAL LOW (ref 135–145)

## 2023-12-17 LAB — PHOSPHORUS: Phosphorus: 3.6 mg/dL (ref 2.5–4.6)

## 2023-12-17 LAB — TROPONIN T, HIGH SENSITIVITY: Troponin T High Sensitivity: 18 ng/L (ref 0–19)

## 2023-12-17 LAB — MAGNESIUM: Magnesium: 2.2 mg/dL (ref 1.7–2.4)

## 2023-12-17 SURGERY — CANCELLED PROCEDURE

## 2023-12-17 MED ORDER — METHOCARBAMOL 500 MG PO TABS
500.0000 mg | ORAL_TABLET | Freq: Four times a day (QID) | ORAL | Status: DC | PRN
  Administered 2023-12-17 – 2023-12-18 (×2): 500 mg via ORAL
  Filled 2023-12-17 (×2): qty 1

## 2023-12-17 MED ORDER — POTASSIUM CHLORIDE CRYS ER 20 MEQ PO TBCR
40.0000 meq | EXTENDED_RELEASE_TABLET | Freq: Once | ORAL | Status: AC
  Administered 2023-12-17: 40 meq via ORAL
  Filled 2023-12-17: qty 2

## 2023-12-17 MED ORDER — SODIUM CHLORIDE 0.9 % IV BOLUS
500.0000 mL | Freq: Once | INTRAVENOUS | Status: AC
  Administered 2023-12-17: 500 mL via INTRAVENOUS

## 2023-12-17 MED ORDER — POTASSIUM CHLORIDE 10 MEQ/100ML IV SOLN
10.0000 meq | INTRAVENOUS | Status: DC
  Administered 2023-12-17: 10 meq via INTRAVENOUS
  Filled 2023-12-17 (×3): qty 100

## 2023-12-17 MED ORDER — ENSURE PLUS HIGH PROTEIN PO LIQD
237.0000 mL | Freq: Two times a day (BID) | ORAL | Status: DC
  Administered 2023-12-17 – 2023-12-18 (×2): 237 mL via ORAL

## 2023-12-17 MED ORDER — METOPROLOL TARTRATE 5 MG/5ML IV SOLN
5.0000 mg | Freq: Once | INTRAVENOUS | Status: AC
  Administered 2023-12-17: 5 mg via INTRAVENOUS
  Filled 2023-12-17: qty 5

## 2023-12-17 MED ORDER — SODIUM CHLORIDE 0.9 % IV SOLN
2.0000 g | INTRAVENOUS | Status: DC
  Administered 2023-12-18: 2 g via INTRAVENOUS
  Filled 2023-12-17: qty 20

## 2023-12-17 NOTE — Plan of Care (Signed)

## 2023-12-17 NOTE — Progress Notes (Addendum)
 Physical Therapy Treatment Patient Details Name: Christy Moore MRN: [id number] DOB: April 19, 1945 Today's Date: 12/17/2023   History of Present Illness 78 yo female admitted with chest pain, SIRS. Hx of Sjogren's, RA, ILD, cardiomegaly, esophageal stricture    PT Comments  Pt resting in bed, spouse at bedside. Reviewed events from overnight and cancelled procedure from today. Pt reports continued chest pain however it has improved since last night. Improved mobility compared to yesterday, ambulates at close SPV with RW for 274ft. She tolerate entire session on RA with O2 sats remaining >95%. Education provided on energy conservation, activity modification and exercise/mobility following discharge. PT will continue to follow while pt in acute hospital. No follow up therapy needed.    If plan is discharge home, recommend the following: A little help with walking and/or transfers;A little help with bathing/dressing/bathroom;Assistance with cooking/housework;Assist for transportation;Help with stairs or ramp for entrance   Can travel by private vehicle        Equipment Recommendations  None recommended by PT    Recommendations for Other Services       Precautions / Restrictions Precautions Precautions: None Recall of Precautions/Restrictions: Intact Restrictions Weight Bearing Restrictions Per Provider Order: No     Mobility  Bed Mobility Overal bed mobility: Modified Independent Bed Mobility: Supine to Sit, Sit to Supine     Supine to sit: Modified independent (Device/Increase time), HOB elevated, Used rails          Transfers Overall transfer level: Needs assistance Equipment used: Rolling walker (2 wheels)   Sit to Stand: Supervision                Ambulation/Gait Ambulation/Gait assistance: Contact guard assist, Supervision Gait Distance (Feet): 200 Feet Assistive device: Rolling walker (2 wheels)         General Gait Details: slow steady cadence  secondary to chest pain, however O2 and HR stable with mobility.   Stairs             Wheelchair Mobility     Tilt Bed    Modified Rankin (Stroke Patients Only)       Balance Overall balance assessment: Modified Independent Sitting-balance support: Bilateral upper extremity supported Sitting balance-Leahy Scale: Good     Standing balance support: Reliant on assistive device for balance, During functional activity Standing balance-Leahy Scale: Good Standing balance comment: requires use of RW                            Communication Communication Communication: No apparent difficulties  Cognition Arousal: Alert Behavior During Therapy: WFL for tasks assessed/performed   PT - Cognitive impairments: No apparent impairments                         Following commands: Intact      Cueing Cueing Techniques: Verbal cues  Exercises      General Comments        Pertinent Vitals/Pain Pain Assessment Pain Assessment: Faces Faces Pain Scale: Hurts little more Pain Location: chest pain, mild worsening pain when walking Pain Descriptors / Indicators: Aching, Discomfort, Grimacing Pain Intervention(s): Limited activity within patient's tolerance, Monitored during session, Repositioned, Patient requesting pain meds-RN notified    Home Living                          Prior Function  PT Goals (current goals can now be found in the care plan section) Acute Rehab PT Goals Patient Stated Goal: less pain PT Goal Formulation: With patient/family Time For Goal Achievement: 12/30/23 Potential to Achieve Goals: Good Progress towards PT goals: Progressing toward goals    Frequency    Min 3X/week      PT Plan      Co-evaluation              AM-PAC PT 6 Clicks Mobility   Outcome Measure  Help needed turning from your back to your side while in a flat bed without using bedrails?: None Help needed moving from  lying on your back to sitting on the side of a flat bed without using bedrails?: None Help needed moving to and from a bed to a chair (including a wheelchair)?: A Little Help needed standing up from a chair using your arms (e.g., wheelchair or bedside chair)?: A Little Help needed to walk in hospital room?: A Little Help needed climbing 3-5 steps with a railing? : A Little 6 Click Score: 20    End of Session Equipment Utilized During Treatment: Gait belt Activity Tolerance: Patient tolerated treatment well Patient left: in bed;with call bell/phone within reach;with bed alarm set;with family/visitor present Nurse Communication: Mobility status PT Visit Diagnosis: Pain;Difficulty in walking, not elsewhere classified (R26.2);Unsteadiness on feet (R26.81) Pain - part of body:  (chest)     Time: 8584-8560 PT Time Calculation (min) (ACUTE ONLY): 24 min  Charges:    $Gait Training: 8-22 mins $Therapeutic Activity: 8-22 mins                       Christy Moore. [name], PT, DPT    [employer] 12/17/2023, 4:02 PM

## 2023-12-17 NOTE — Progress Notes (Addendum)
 Progress Note   Subjective  Hospital day #3 Chief Complaint: Esophagitis/GERD and atypical chest pain as well as SIRS  Overnight patient had an acute event with chest pain and a heart rate in the 140s, given a dose of Maalox, lidocaine  and morphine , heart rate seems to normalize but spiked again around 2:00 in the morning with a stable blood pressure, EKG normal, given a bolus of normal saline and magnesium .  Continued to be tachycardic but had no further pain.  RN notified of V-fib on the monitor around 3:00 in the morning.  She reported the patient's eyes rolled back.  CBC this morning with potassium low at 3.4, sodium low at 134 and WBC elevated at 21.2, 10.8 yesterday.  Now requiring 2 L of nasal oxygen again.  Tells me she just does not feel well found sitting in her bedside chair.  Complaining that she is NPO.  Does tell me though that her atypical chest pain is some better this morning.    Objective   Vital signs in last 24 hours: Temp:  [97.7 F (36.5 C)-99.4 F (37.4 C)] 97.8 F (36.6 C) (12/11 0750) Pulse Rate:  [71-152] 96 (12/11 0750) Resp:  [15-22] 20 (12/11 0750) BP: (93-135)/(57-75) 93/65 (12/11 0750) SpO2:  [94 %-96 %] 94 % (12/11 0750) Last BM Date : 12/15/23 General:   Elderly white female in mild distress Heart:  Regular rate and rhythm; no murmurs Lungs: Respirations even and unlabored, lungs CTA bilaterally + on O2 via nasal cannula 2 L Abdomen:  Soft, nontender and nondistended. Normal bowel sounds. Psych:  Cooperative. Normal mood and affect.  Intake/Output from previous day: 12/10 0701 - 12/11 0700 In: 1710.7 [P.O.:600; IV Piggyback:1110.7] Out: -   Lab Results: Recent Labs    12/15/23 1516 12/16/23 0417 12/17/23 0342  WBC 10.8* 10.8* 21.2*  HGB 13.8 11.6* 12.6  HCT 41.1 35.3* 38.2  PLT 255 246 215   BMET Recent Labs    12/15/23 1516 12/16/23 0417 12/17/23 0342  NA 138 138 134*  K 5.5* 3.8 3.4*  CL 100 103 100  CO2 27 25 21*   GLUCOSE 153* 109* 174*  BUN 11 8 9   CREATININE 0.89 0.66 0.85  CALCIUM 10.2 8.8* 8.7*   Studies/Results: ECHOCARDIOGRAM COMPLETE Result Date: 12/16/2023    ECHOCARDIOGRAM REPORT   Patient Name:   ANNAMAY LAYMON [facility] Date of Exam: 12/16/2023 Medical Rec #:  [id number]       Height:       64.5 in Accession #:    [id number]      Weight:       149.5 lb Date of Birth:  03-Jun-1945       BSA:          1.738 m Patient Age:    78 years        BP:           114/54 mmHg Patient Gender: F               HR:           94 bpm. Exam Location:  Inpatient Procedure: 2D Echo, Cardiac Doppler, Color Doppler and Intracardiac            Opacification Agent (Both Spectral and Color Flow Doppler were            utilized during procedure). Indications:    R07.9* Chest pain, unspecified  History:        Patient has prior history  of Echocardiogram examinations.                 Cardiomegaly, Signs/Symptoms:Fever; Risk Factors:Hypertension.  Sonographer:    Ellouise Mose RDCS Referring Phys: [id number] JORIE SAUNDERS PATEL  Sonographer Comments: Study delayed for consult. IMPRESSIONS  1. Left ventricular ejection fraction, by estimation, is 50 to 55%. The left ventricle has low normal function. The left ventricle has no regional wall motion abnormalities. The global longitudinal strain is normal.  2. Right ventricular systolic function is normal. The right ventricular size is normal. There is severely elevated pulmonary artery systolic pressure. The estimated right ventricular systolic pressure is 61.0 mmHg.  3. The mitral valve is normal in structure. Mild mitral valve regurgitation. No evidence of mitral stenosis.  4. Tricuspid valve regurgitation is mild to moderate.  5. The aortic valve is normal in structure. Aortic valve regurgitation is not visualized. No aortic stenosis is present.  6. The inferior vena cava is normal in size with greater than 50% respiratory variability, suggesting right atrial pressure of 3 mmHg. FINDINGS  Left Ventricle:  Left ventricular ejection fraction, by estimation, is 50 to 55%. The left ventricle has low normal function. The left ventricle has no regional wall motion abnormalities. Definity contrast agent was given IV to delineate the left ventricular endocardial borders. Strain was performed and the global longitudinal strain is normal. The left ventricular internal cavity size was normal in size. There is no left ventricular hypertrophy. Abnormal (paradoxical) septal motion, consistent with left bundle branch block. Right Ventricle: The right ventricular size is normal. No increase in right ventricular wall thickness. Right ventricular systolic function is normal. There is severely elevated pulmonary artery systolic pressure. The tricuspid regurgitant velocity is 3.39 m/s, and with an assumed right atrial pressure of 15 mmHg, the estimated right ventricular systolic pressure is 61.0 mmHg. Left Atrium: Left atrial size was normal in size. Right Atrium: Right atrial size was normal in size. Pericardium: There is no evidence of pericardial effusion. Mitral Valve: The mitral valve is normal in structure. Mild mitral valve regurgitation. No evidence of mitral valve stenosis. MV peak gradient, 9.2 mmHg. The mean mitral valve gradient is 5.5 mmHg. Tricuspid Valve: The tricuspid valve is normal in structure. Tricuspid valve regurgitation is mild to moderate. No evidence of tricuspid stenosis. Aortic Valve: The aortic valve is normal in structure. Aortic valve regurgitation is not visualized. No aortic stenosis is present. Aortic valve mean gradient measures 10.5 mmHg. Aortic valve peak gradient measures 18.5 mmHg. Aortic valve area, by VTI measures 1.73 cm. Pulmonic Valve: The pulmonic valve was normal in structure. Pulmonic valve regurgitation is not visualized. No evidence of pulmonic stenosis. Aorta: The aortic root is normal in size and structure. Venous: The inferior vena cava is normal in size with greater than 50%  respiratory variability, suggesting right atrial pressure of 3 mmHg. IAS/Shunts: No atrial level shunt detected by color flow Doppler.  LEFT VENTRICLE PLAX 2D LVIDd:         4.80 cm     Diastology LVIDs:         3.40 cm     LV e' medial:    16.10 cm/s LV PW:         1.30 cm     LV E/e' medial:  8.0 LV IVS:        0.80 cm     LV e' lateral:   11.10 cm/s LVOT diam:     2.00 cm     LV E/e'  lateral: 11.6 LV SV:         62 LV SV Index:   36 LVOT Area:     3.14 cm  LV Volumes (MOD) LV vol d, MOD A2C: 74.7 ml LV vol d, MOD A4C: 88.5 ml LV vol s, MOD A2C: 34.0 ml LV vol s, MOD A4C: 46.6 ml LV SV MOD A2C:     40.8 ml LV SV MOD A4C:     88.5 ml LV SV MOD BP:      41.3 ml RIGHT VENTRICLE              IVC RV S prime:     155.85 cm/s  IVC diam: 1.20 cm TAPSE (M-mode): 2.8 cm                              PULMONARY VEINS                              Diastolic Velocity: 60.20 cm/s                              S/D Velocity:       1.40                              Systolic Velocity:  83.30 cm/s LEFT ATRIUM           Index        RIGHT ATRIUM           Index LA diam:      2.40 cm 1.38 cm/m   RA Area:     16.70 cm LA Vol (A2C): 24.7 ml 14.21 ml/m  RA Volume:   38.75 ml  22.30 ml/m LA Vol (A4C): 44.1 ml 25.38 ml/m  AORTIC VALVE AV Area (Vmax):    1.86 cm AV Area (Vmean):   1.78 cm AV Area (VTI):     1.73 cm AV Vmax:           215.00 cm/s AV Vmean:          152.500 cm/s AV VTI:            0.360 m AV Peak Grad:      18.5 mmHg AV Mean Grad:      10.5 mmHg LVOT Vmax:         127.50 cm/s LVOT Vmean:        86.600 cm/s LVOT VTI:          0.198 m LVOT/AV VTI ratio: 0.55  AORTA Ao Root diam: 2.90 cm Ao Asc diam:  3.30 cm MITRAL VALVE                TRICUSPID VALVE MV Area (PHT): 5.02 cm     TR Peak grad:   46.0 mmHg MV Area VTI:   1.95 cm     TR Vmax:        339.00 cm/s MV Peak grad:  9.2 mmHg MV Mean grad:  5.5 mmHg     SHUNTS MV Vmax:       1.52 m/s     Systemic VTI:  0.20 m MV Vmean:      113.0 cm/s   Systemic Diam: 2.00 cm MV  Decel Time: 151 msec MR Peak grad: 96.4 mmHg MR  Mean grad: 45.0 mmHg MR Vmax:      491.00 cm/s MR Vmean:     306.0 cm/s MV E velocity: 129.00 cm/s MV A velocity: 136.00 cm/s MV E/A ratio:  0.95 Oneil Parchment MD Electronically signed by Oneil Parchment MD Signature Date/Time: 12/16/2023/12:44:02 PM    Final    CT Angio Chest/Abd/Pel for Dissection W and/or Wo Contrast Result Date: 12/15/2023 CLINICAL DATA:  Chest pain radiating to back and throat, nausea since this morning, difficulty breathing EXAM: CT ANGIOGRAPHY CHEST, ABDOMEN AND PELVIS TECHNIQUE: Non-contrast CT of the chest was initially obtained. Multidetector CT imaging through the chest, abdomen and pelvis was performed using the standard protocol during bolus administration of intravenous contrast. Multiplanar reconstructed images and MIPs were obtained and reviewed to evaluate the vascular anatomy. RADIATION DOSE REDUCTION: This exam was performed according to the departmental dose-optimization program which includes automated exposure control, adjustment of the mA and/or kV according to patient size and/or use of iterative reconstruction technique. CONTRAST:  OMNIPAQUE  IOHEXOL  350 MG/ML SOLN COMPARISON:  12/15/2023, 07/01/2023 FINDINGS: CTA CHEST FINDINGS Cardiovascular: No evidence of thoracic aortic aneurysm or dissection. Atherosclerosis of the thoracic aorta. Average origin of the right subclavian artery is noted, a frequent anatomic variant. The heart is enlarged, without pericardial effusion. There is technically adequate opacification of the pulmonary vasculature, with no filling defects or pulmonary emboli identified. Mediastinum/Nodes: No enlarged mediastinal, hilar, or axillary lymph nodes. Thyroid  gland, trachea, and esophagus demonstrate no significant findings. Lungs/Pleura: There is a trace left pleural effusion. Stable bibasilar scarring, left greater than right. No acute airspace disease. No pneumothorax. Mild bilateral lower lobe  bronchiectasis. Central airways are patent. Musculoskeletal: No acute or destructive bony abnormalities. Reconstructed images demonstrate no additional findings. Review of the MIP images confirms the above findings. CTA ABDOMEN AND PELVIS FINDINGS VASCULAR Aorta: Normal caliber aorta without aneurysm, dissection, vasculitis or significant stenosis. Mild atherosclerosis. Celiac: Patent without evidence of aneurysm, dissection, vasculitis or significant stenosis. SMA: Patent without evidence of aneurysm, dissection, vasculitis or significant stenosis. Renals: Both renal arteries are patent without evidence of aneurysm, dissection, vasculitis, fibromuscular dysplasia or significant stenosis. IMA: Patent without evidence of aneurysm, dissection, vasculitis or significant stenosis. Inflow: Patent without evidence of aneurysm, dissection, vasculitis or significant stenosis. Veins: No obvious venous abnormality within the limitations of this arterial phase study. Review of the MIP images confirms the above findings. NON-VASCULAR Hepatobiliary: No focal liver abnormality is seen. No gallstones, gallbladder wall thickening, or biliary dilatation. Pancreas: Unremarkable. No pancreatic ductal dilatation or surrounding inflammatory changes. Spleen: Normal in size without focal abnormality. Adrenals/Urinary Tract: Adrenal glands are unremarkable. Kidneys are normal, without renal calculi, focal lesion, or hydronephrosis. Bladder is unremarkable. Stomach/Bowel: No bowel obstruction or ileus. Normal appendix right lower quadrant. No bowel wall thickening or inflammatory change. Lymphatic: No pathologic adenopathy within the abdomen or pelvis. Reproductive: Uterus and bilateral adnexa are unremarkable. Other: No free fluid or free intraperitoneal gas. Small fat containing umbilical hernia. No bowel herniation. Musculoskeletal: No acute or destructive bony abnormalities. Reconstructed images demonstrate no additional findings.  Review of the MIP images confirms the above findings. IMPRESSION: Vascular: 1. No evidence of thoracoabdominal aortic aneurysm or dissection. 2. No evidence of pulmonary embolus. 3.  Aortic Atherosclerosis (ICD10-I70.0). Nonvascular: 1. Cardiomegaly. 2. Trace left pleural effusion. 3. Chronic bibasilar scarring and bronchiectasis. 4. No acute intra-abdominal or intrapelvic process. Electronically Signed   By: Ozell Daring M.D.   On: 12/15/2023 17:32   DG Chest 2 View Result Date: 12/15/2023  EXAM: 2 VIEW(S) XRAY OF THE CHEST 12/15/2023 02:44:25 PM COMPARISON: 05/17/2023 CLINICAL HISTORY: CP FINDINGS: LUNGS AND PLEURA: Linear scarring at right lung base. Asymmetric patchy opacities at left lung base. Possible small left pleural effusion. No pneumothorax. HEART AND MEDIASTINUM: Atherosclerotic aortic calcifications. No acute abnormality of the cardiac and mediastinal silhouettes. BONES AND SOFT TISSUES: No acute osseous abnormality. IMPRESSION: 1. Asymmetric patchy opacities at the left lung base with a small left pleural effusion is suspected. Electronically signed by: Norleen Boxer MD 12/15/2023 03:42 PM EST RP Workstation: [id number]     Assessment / Plan:   Assessment: 1.  Atypical chest pain: Complaint of central chest pain, troponins negative x 2, EKG not significantly changed from previous, CT of the chest unrevealing for acute ideology, echo with LVEF 50-55% yesterday and severely elevated pulmonary artery systolic pressure, chest pain is most likely musculoskeletal in nature given no association with eating, also describes it sounds like a spasm worsened by leaning forward, no odynophagia, some dysphagia; consider relation to Sjogren's and dysmotility +/- spasm +/- stricture +/- GERD +/- opportunistic infection given methotrexate  use 2.  SIRS: With tachycardia and fever, no obvious infectious process other than potentially exacerbation of bronchiectasis, COVID/flu/RSV negative, antibiotics continued  on IV ceftriaxone , WBC elevated overnight from 10.8-21.2, no fevers 3.  Acute respiratory failure with hypoxia: In ED required 2 L O2 via nasal cannula, CTA chest negative for PE or consolidation, trace left pleural effusion and chronic bibasilar scarring and bronchiectasis, bibasilar inspiratory crackles on exam otherwise lungs clear, started on IV ceftriaxone  and albuterol , initially required supplemental O2 changed to room air, overnight acute events with worsening chest pain, worsened leukocytosis and now again requiring oxygen, echo yesterday or today with severely elevated pulmonary artery systolic pressure 4.  Sjogren syndrome/rheumatoid arthritis: Typically on Methotrexate  which is on hold  Plan: 1.  Due to patient's acute events overnight with tachycardia and now requiring oxygen again with leukocytosis and feeling unwell we canceled EGD with dilation today.  Discussed this with the patient today.  We will not reschedule yet and wait to see how she does.  Again do not think that atypical chest pain is likely related to her esophagus given description of symptoms, but has had some benefit for her dysphagia symptoms in the past with dilation. 2.  Continue antibiotics 3.  Continue Pantoprazole  40 mg IV twice daily. 4.  Will allow regular diet as tolerated today, patient seems to manage eating food that she can quite well. 5.  Continue Lovenox  today given no plans for procedure.  Thank you for your kind consultation, we will continue to follow.   LOS: 1 day   Delon Hendricks Failing  12/17/2023, 9:40 AM  GI ATTENDING  Interval history and data reviewed.  Agree with interval progress note as outlined.  Interval cardiopulmonary events noted.  This takes precedent in her hospital care.  Her GI issues are nonurgent and could even be addressed in the outpatient setting.  We will continue to follow, but have no immediate plans for endoscopy.  Norleen SAILOR. Abran Raddle., M.D. [facility] Division  of Gastroenterology

## 2023-12-17 NOTE — Progress Notes (Signed)
 PROGRESS NOTE  Christy Moore  FMW:[id number] DOB: 1945-11-13 DOA: 12/15/2023 PCP: Micheal Wolm LELON, MD   Brief Narrative: Patient is a 78 year old female with history of Sjogren syndrome, rheumatoid  arthritis, ILD, bronchiectasis, hypertension, hypothyroidism, cardiomegaly, esophageal stricture s/p dilatation, MGUS who presented with central chest pain radiating to her back while watching TV.  Also reported shortness of breath, cough and subjective fever at home.  And presentation, she was  in mild sinus tachycardia,  found to have a fever of 101.6, tachypneic, tachycardic, hypoxic and had to be placed on 2 L of oxygen.  Lab work showed potassium 5.5, lactate of 2.1.  COVID/flu/RSV negative.  UA not suspicious for UTI.  Blood cultures collected.  CT chest/abdomen/pelvis showed cardiomegaly, trace left pleural effusion, chronic bibasilar scarring/bronchiectasis, no evidence of PE.  Patient started on antibiotics.  GI consulted  for suspicion of severe GERD/esophagitis , plan for EGD.  She  also went into new onset A-fib/cardiology consulted  Assessment & Plan:  Principal Problem:   SIRS (systemic inflammatory response syndrome) (HCC) Active Problems:   Acute respiratory failure with hypoxia (HCC)   Hypothyroidism   Hypertension   Sjogren's disease   Sepsis (HCC)   Atypical chest pain   Esophageal dysphagia  SIRS: Presented with fever, tachycardia, tachypnea.  Mild elevated lactate which has resolved.  No obvious infectious process identified.  COVID/flu/RSV negative.  CT chest/abdomen/pelvis unrevealing.  Follow-up cultures, no growth till date.  Respiratory viral panel has been negative.   Leucocytosis worsened today.  Chest x-ray follow-up showed small left pleural effusion with adjacent atelectasis,minimal right basilar scarring.  Continue antibiotics.  She is afebrile.  She is allergic to azithromycin  or doxycycline  so currently on ceftriaxone  only .  Acute hypoxic respiratory  failure/history of bronchiectasis: Presented with hypoxia.  CTA chest negative for PE or pneumonia.  Showed trace left pleural effusion, chronic bibasilar scarring, bronchiectasis.  Could be exacerbation of bronchiectasis as well.  History of Sjogren syndrome.  Continue current antibiotic, bronchodilators.  Chest x-ray follow-up did not show any new pneumonia.  Chest pain/new onset A-fib: Last night she went into rapid A-fib.  She was given metoprolol  that trigger prolonged pause of 17 seconds.  This morning she is in normal sinus rhythm.  Cardiology following.  Currently holding AV nodal agents.  There is planning to start on Eliquis after EGD.  She continues to complain of vague chest pain which is likely seems musculoskeletal.  CT chest on admission was unremarkable.  Continue muscle relaxants. Echo done here showed EF of 50 to 55%, no wall motion abnormality.  Epigastric pain/history of GERD/esophageal stricture:   Has history of reflux, esophageal stricture s/p dilatation.  Pain is mainly in the epigastric region, burning in nature.  Also has pain on the muscles of the neck.  Will continue Protonix .  Continue muscle relaxants.  GI consulted.  Plan for EGD   Hypertension: On amlodipine  at home.  Blood pressure   Hyperkalemia: Resolved after given Lokelma .  Hypothyroidism: On Synthyroid  History of Sjogren's syndrome/RA: Home methotrexate  on hold  Debility/deconditioning: Consulted PT         DVT prophylaxis:enoxaparin  (LOVENOX ) injection 40 mg Start: 12/16/23 1000     Code Status: Full Code  Family Communication: Husband at bedside on 12/11  Patient status:Inpatient  Patient is from :home  Anticipated discharge un:ynfz  Estimated DC date:after full workup   Consultants: GI, cardiology  Procedures:None  Antimicrobials:  Anti-infectives (From admission, onward)    Start  Dose/Rate Route Frequency Ordered Stop   12/18/23 0600  cefTRIAXone  (ROCEPHIN ) 2 g in sodium  chloride 0.9 % 100 mL IVPB        2 g 200 mL/hr over 30 Minutes Intravenous Every 24 hours 12/17/23 0948     12/16/23 0200  cefTRIAXone  (ROCEPHIN ) 1 g in sodium chloride  0.9 % 100 mL IVPB  Status:  Discontinued        1 g 200 mL/hr over 30 Minutes Intravenous Every 24 hours 12/16/23 0046 12/17/23 0948   12/15/23 1745  ceFEPIme  (MAXIPIME ) 2 g in sodium chloride  0.9 % 100 mL IVPB        2 g 200 mL/hr over 30 Minutes Intravenous  Once 12/15/23 1734 12/15/23 1943   12/15/23 1745  metroNIDAZOLE  (FLAGYL ) IVPB 500 mg        500 mg 100 mL/hr over 60 Minutes Intravenous  Once 12/15/23 1734 12/15/23 2028   12/15/23 1745  vancomycin  (VANCOCIN ) IVPB 1000 mg/200 mL premix        1,000 mg 200 mL/hr over 60 Minutes Intravenous  Once 12/15/23 1734 12/15/23 2027       Subjective: Patient seen and examined at bedside today.  Hemodynamically stable.  Overall comfortable this morning.  Went into A-fib and then prolonged pauses last night.  On normal sinus rhythm this morning.  On 2 L of oxygen per minute.  Denies any worsening shortness of breath or cough.  Continues to complain of vague chest/epigastric discomfort.  Objective: Vitals:   12/17/23 0152 12/17/23 0319 12/17/23 0340 12/17/23 0750  BP: 103/73 113/75 107/67 93/65  Pulse: (!) 152 97 85 96  Resp: 15 18 (!) 21 20  Temp: 97.7 F (36.5 C) 98.3 F (36.8 C) 97.8 F (36.6 C) 97.8 F (36.6 C)  TempSrc: Oral Oral Oral Oral  SpO2: 96% 96% 95% 94%  Weight:      Height:        Intake/Output Summary (Last 24 hours) at 12/17/2023 1239 Last data filed at 12/17/2023 0600 Gross per 24 hour  Intake 1350.68 ml  Output --  Net 1350.68 ml   Filed Weights   12/15/23 1418 12/15/23 2200  Weight: 65.5 kg 67.8 kg    Examination:   General exam: Overall comfortable, not in distress, pleasant female HEENT: PERRL Respiratory system: Fine scattered bibasilar crackles Cardiovascular system: S1 & S2 heard, RRR.  Gastrointestinal system: Abdomen is  nondistended, soft and nontender. Central nervous system: Alert and oriented Extremities: No edema, no clubbing ,no cyanosis Skin: No rashes, no ulcers,no icterus     Data Reviewed: I have personally reviewed following labs and imaging studies  CBC: Recent Labs  Lab 12/15/23 1516 12/16/23 0417 12/17/23 0342  WBC 10.8* 10.8* 21.2*  HGB 13.8 11.6* 12.6  HCT 41.1 35.3* 38.2  MCV 93.4 95.1 95.5  PLT 255 246 215   Basic Metabolic Panel: Recent Labs  Lab 12/15/23 1516 12/16/23 0417 12/17/23 0338 12/17/23 0342  NA 138 138  --  134*  K 5.5* 3.8  --  3.4*  CL 100 103  --  100  CO2 27 25  --  21*  GLUCOSE 153* 109*  --  174*  BUN 11 8  --  9  CREATININE 0.89 0.66  --  0.85  CALCIUM 10.2 8.8*  --  8.7*  MG  --   --   --  2.2  PHOS  --   --  3.6  --      Recent Results (from  the past 240 hours)  Culture, blood (single)     Status: None (Preliminary result)   Collection Time: 12/15/23  5:32 PM   Specimen: BLOOD  Result Value Ref Range Status   Specimen Description   Final    BLOOD LEFT ANTECUBITAL Performed at [facility], [address], [place], [place] [address]    Special Requests   Final    Blood Culture adequate volume BOTTLES DRAWN AEROBIC AND ANAEROBIC Performed at [facility], [address], [place], [place] [address]    Culture   Final    NO GROWTH 2 DAYS Performed at [facility], [address], [place], [place] [address]    Report Status PENDING  Incomplete  Resp panel by RT-PCR (RSV, Flu A&B, Covid) Urine, Clean Catch     Status: None   Collection Time: 12/15/23  5:37 PM   Specimen: Urine, Clean Catch; Nasal Swab  Result Value Ref Range Status   SARS Coronavirus 2 by RT PCR NEGATIVE NEGATIVE Final    Comment: (NOTE) SARS-CoV-2 target nucleic acids are NOT DETECTED.  The SARS-CoV-2 RNA is generally detectable in upper respiratory specimens during the acute phase of infection. The lowest concentration  of SARS-CoV-2 viral copies this assay can detect is 138 copies/mL. A negative result does not preclude SARS-Cov-2 infection and should not be used as the sole basis for treatment or other patient management decisions. A negative result may occur with  improper specimen collection/handling, submission of specimen other than nasopharyngeal swab, presence of viral mutation(s) within the areas targeted by this assay, and inadequate number of viral copies(<138 copies/mL). A negative result must be combined with clinical observations, patient history, and epidemiological information. The expected result is Negative.  Fact Sheet for Patients:  bloggercourse.com  Fact Sheet for Healthcare Providers:  seriousbroker.it  This test is no t yet approved or cleared by the [place]  FDA and  has been authorized for detection and/or diagnosis of SARS-CoV-2 by FDA under an Emergency Use Authorization (EUA). This EUA will remain  in effect (meaning this test can be used) for the duration of the COVID-19 declaration under Section 564(b)(1) of the Act, 21 U.S.C.section 360bbb-3(b)(1), unless the authorization is terminated  or revoked sooner.       Influenza A by PCR NEGATIVE NEGATIVE Final   Influenza B by PCR NEGATIVE NEGATIVE Final    Comment: (NOTE) The Xpert Xpress SARS-CoV-2/FLU/RSV plus assay is intended as an aid in the diagnosis of influenza from Nasopharyngeal swab specimens and should not be used as a sole basis for treatment. Nasal washings and aspirates are unacceptable for Xpert Xpress SARS-CoV-2/FLU/RSV testing.  Fact Sheet for Patients: bloggercourse.com  Fact Sheet for Healthcare Providers: seriousbroker.it  This test is not yet approved or cleared by the [place]  FDA and has been authorized for detection and/or diagnosis of SARS-CoV-2 by FDA under an Emergency Use  Authorization (EUA). This EUA will remain in effect (meaning this test can be used) for the duration of the COVID-19 declaration under Section 564(b)(1) of the Act, 21 U.S.C. section 360bbb-3(b)(1), unless the authorization is terminated or revoked.     Resp Syncytial Virus by PCR NEGATIVE NEGATIVE Final    Comment: (NOTE) Fact Sheet for Patients: bloggercourse.com  Fact Sheet for Healthcare Providers: seriousbroker.it  This test is not yet approved or cleared by the [place]  FDA and has been authorized for detection and/or diagnosis of SARS-CoV-2 by FDA under an Emergency Use Authorization (EUA). This EUA  will remain in effect (meaning this test can be used) for the duration of the COVID-19 declaration under Section 564(b)(1) of the Act, 21 U.S.C. section 360bbb-3(b)(1), unless the authorization is terminated or revoked.  Performed at [facility], [address] [place], [place] [address]   Urine Culture     Status: None   Collection Time: 12/15/23  5:37 PM   Specimen: Urine, Clean Catch  Result Value Ref Range Status   Specimen Description   Final    URINE, CLEAN CATCH Performed at [facility], [address], [place], [place] [address]    Special Requests   Final    NONE Performed at [facility], [address] [place], [place] [address]    Culture   Final    NO GROWTH Performed at [facility], [address], [place], [place] [address]    Report Status 12/16/2023 FINAL  Final  Respiratory (~20 pathogens) panel by PCR     Status: None   Collection Time: 12/15/23 11:00 PM   Specimen: Nasopharyngeal Swab; Respiratory  Result Value Ref Range Status   Adenovirus NOT DETECTED NOT DETECTED Final   Coronavirus 229E NOT DETECTED NOT DETECTED Final    Comment: (NOTE) The Coronavirus on the Respiratory Panel, DOES NOT test for the novel  Coronavirus  (2019 nCoV)    Coronavirus HKU1 NOT DETECTED NOT DETECTED Final   Coronavirus NL63 NOT DETECTED NOT DETECTED Final   Coronavirus OC43 NOT DETECTED NOT DETECTED Final   Metapneumovirus NOT DETECTED NOT DETECTED Final   Rhinovirus / Enterovirus NOT DETECTED NOT DETECTED Final   Influenza A NOT DETECTED NOT DETECTED Final   Influenza B NOT DETECTED NOT DETECTED Final   Parainfluenza Virus 1 NOT DETECTED NOT DETECTED Final   Parainfluenza Virus 2 NOT DETECTED NOT DETECTED Final   Parainfluenza Virus 3 NOT DETECTED NOT DETECTED Final   Parainfluenza Virus 4 NOT DETECTED NOT DETECTED Final   Respiratory Syncytial Virus NOT DETECTED NOT DETECTED Final   Bordetella pertussis NOT DETECTED NOT DETECTED Final   Bordetella Parapertussis NOT DETECTED NOT DETECTED Final   Chlamydophila pneumoniae NOT DETECTED NOT DETECTED Final   Mycoplasma pneumoniae NOT DETECTED NOT DETECTED Final    Comment: Performed at [facility], [address] [place], [place] [address]     Radiology Studies: DG CHEST PORT 1 VIEW Result Date: 12/17/2023 EXAM: XR Chest, 1 View(s). CLINICAL HISTORY: SOB (shortness of breath). COMPARISON: 2 days ago. FINDINGS: LUNGS: Adjacent atelectasis. Minimal right basilar scarring is again noted. PLEURAL SPACES: Small left pleural effusion is noted. No pneumothorax. HEART: The heart size is normal. BONES: No acute osseous abnormality. IMPRESSION: 1. Small left pleural effusion with adjacent atelectasis. 2. Minimal right basilar scarring. Electronically signed by: Lynwood Seip MD 12/17/2023 10:51 AM EST RP Workstation: [id number]   ECHOCARDIOGRAM COMPLETE Result Date: 12/16/2023    ECHOCARDIOGRAM REPORT   Patient Name:   Christy Moore [facility] Date of Exam: 12/16/2023 Medical Rec #:  [id number]       Height:       64.5 in Accession #:    [id number]      Weight:       149.5 lb Date of Birth:  19-Feb-1945       BSA:          1.738 m Patient Age:    78 years        BP:           114/54  mmHg Patient  Gender: F               HR:           94 bpm. Exam Location:  Inpatient Procedure: 2D Echo, Cardiac Doppler, Color Doppler and Intracardiac            Opacification Agent (Both Spectral and Color Flow Doppler were            utilized during procedure). Indications:    R07.9* Chest pain, unspecified  History:        Patient has prior history of Echocardiogram examinations.                 Cardiomegaly, Signs/Symptoms:Fever; Risk Factors:Hypertension.  Sonographer:    Ellouise Mose RDCS Referring Phys: [id number] JORIE SAUNDERS PATEL  Sonographer Comments: Study delayed for consult. IMPRESSIONS  1. Left ventricular ejection fraction, by estimation, is 50 to 55%. The left ventricle has low normal function. The left ventricle has no regional wall motion abnormalities. The global longitudinal strain is normal.  2. Right ventricular systolic function is normal. The right ventricular size is normal. There is severely elevated pulmonary artery systolic pressure. The estimated right ventricular systolic pressure is 61.0 mmHg.  3. The mitral valve is normal in structure. Mild mitral valve regurgitation. No evidence of mitral stenosis.  4. Tricuspid valve regurgitation is mild to moderate.  5. The aortic valve is normal in structure. Aortic valve regurgitation is not visualized. No aortic stenosis is present.  6. The inferior vena cava is normal in size with greater than 50% respiratory variability, suggesting right atrial pressure of 3 mmHg. FINDINGS  Left Ventricle: Left ventricular ejection fraction, by estimation, is 50 to 55%. The left ventricle has low normal function. The left ventricle has no regional wall motion abnormalities. Definity contrast agent was given IV to delineate the left ventricular endocardial borders. Strain was performed and the global longitudinal strain is normal. The left ventricular internal cavity size was normal in size. There is no left ventricular hypertrophy. Abnormal (paradoxical) septal motion,  consistent with left bundle branch block. Right Ventricle: The right ventricular size is normal. No increase in right ventricular wall thickness. Right ventricular systolic function is normal. There is severely elevated pulmonary artery systolic pressure. The tricuspid regurgitant velocity is 3.39 m/s, and with an assumed right atrial pressure of 15 mmHg, the estimated right ventricular systolic pressure is 61.0 mmHg. Left Atrium: Left atrial size was normal in size. Right Atrium: Right atrial size was normal in size. Pericardium: There is no evidence of pericardial effusion. Mitral Valve: The mitral valve is normal in structure. Mild mitral valve regurgitation. No evidence of mitral valve stenosis. MV peak gradient, 9.2 mmHg. The mean mitral valve gradient is 5.5 mmHg. Tricuspid Valve: The tricuspid valve is normal in structure. Tricuspid valve regurgitation is mild to moderate. No evidence of tricuspid stenosis. Aortic Valve: The aortic valve is normal in structure. Aortic valve regurgitation is not visualized. No aortic stenosis is present. Aortic valve mean gradient measures 10.5 mmHg. Aortic valve peak gradient measures 18.5 mmHg. Aortic valve area, by VTI measures 1.73 cm. Pulmonic Valve: The pulmonic valve was normal in structure. Pulmonic valve regurgitation is not visualized. No evidence of pulmonic stenosis. Aorta: The aortic root is normal in size and structure. Venous: The inferior vena cava is normal in size with greater than 50% respiratory variability, suggesting right atrial pressure of 3 mmHg. IAS/Shunts: No atrial level shunt detected by color flow Doppler.  LEFT  VENTRICLE PLAX 2D LVIDd:         4.80 cm     Diastology LVIDs:         3.40 cm     LV e' medial:    16.10 cm/s LV PW:         1.30 cm     LV E/e' medial:  8.0 LV IVS:        0.80 cm     LV e' lateral:   11.10 cm/s LVOT diam:     2.00 cm     LV E/e' lateral: 11.6 LV SV:         62 LV SV Index:   36 LVOT Area:     3.14 cm  LV Volumes (MOD)  LV vol d, MOD A2C: 74.7 ml LV vol d, MOD A4C: 88.5 ml LV vol s, MOD A2C: 34.0 ml LV vol s, MOD A4C: 46.6 ml LV SV MOD A2C:     40.8 ml LV SV MOD A4C:     88.5 ml LV SV MOD BP:      41.3 ml RIGHT VENTRICLE              IVC RV S prime:     155.85 cm/s  IVC diam: 1.20 cm TAPSE (M-mode): 2.8 cm                              PULMONARY VEINS                              Diastolic Velocity: 60.20 cm/s                              S/D Velocity:       1.40                              Systolic Velocity:  83.30 cm/s LEFT ATRIUM           Index        RIGHT ATRIUM           Index LA diam:      2.40 cm 1.38 cm/m   RA Area:     16.70 cm LA Vol (A2C): 24.7 ml 14.21 ml/m  RA Volume:   38.75 ml  22.30 ml/m LA Vol (A4C): 44.1 ml 25.38 ml/m  AORTIC VALVE AV Area (Vmax):    1.86 cm AV Area (Vmean):   1.78 cm AV Area (VTI):     1.73 cm AV Vmax:           215.00 cm/s AV Vmean:          152.500 cm/s AV VTI:            0.360 m AV Peak Grad:      18.5 mmHg AV Mean Grad:      10.5 mmHg LVOT Vmax:         127.50 cm/s LVOT Vmean:        86.600 cm/s LVOT VTI:          0.198 m LVOT/AV VTI ratio: 0.55  AORTA Ao Root diam: 2.90 cm Ao Asc diam:  3.30 cm MITRAL VALVE                TRICUSPID VALVE MV  Area (PHT): 5.02 cm     TR Peak grad:   46.0 mmHg MV Area VTI:   1.95 cm     TR Vmax:        339.00 cm/s MV Peak grad:  9.2 mmHg MV Mean grad:  5.5 mmHg     SHUNTS MV Vmax:       1.52 m/s     Systemic VTI:  0.20 m MV Vmean:      113.0 cm/s   Systemic Diam: 2.00 cm MV Decel Time: 151 msec MR Peak grad: 96.4 mmHg MR Mean grad: 45.0 mmHg MR Vmax:      491.00 cm/s MR Vmean:     306.0 cm/s MV E velocity: 129.00 cm/s MV A velocity: 136.00 cm/s MV E/A ratio:  0.95 Oneil Parchment MD Electronically signed by Oneil Parchment MD Signature Date/Time: 12/16/2023/12:44:02 PM    Final    CT Angio Chest/Abd/Pel for Dissection W and/or Wo Contrast Result Date: 12/15/2023 CLINICAL DATA:  Chest pain radiating to back and throat, nausea since this morning, difficulty  breathing EXAM: CT ANGIOGRAPHY CHEST, ABDOMEN AND PELVIS TECHNIQUE: Non-contrast CT of the chest was initially obtained. Multidetector CT imaging through the chest, abdomen and pelvis was performed using the standard protocol during bolus administration of intravenous contrast. Multiplanar reconstructed images and MIPs were obtained and reviewed to evaluate the vascular anatomy. RADIATION DOSE REDUCTION: This exam was performed according to the departmental dose-optimization program which includes automated exposure control, adjustment of the mA and/or kV according to patient size and/or use of iterative reconstruction technique. CONTRAST:  OMNIPAQUE  IOHEXOL  350 MG/ML SOLN COMPARISON:  12/15/2023, 07/01/2023 FINDINGS: CTA CHEST FINDINGS Cardiovascular: No evidence of thoracic aortic aneurysm or dissection. Atherosclerosis of the thoracic aorta. Average origin of the right subclavian artery is noted, a frequent anatomic variant. The heart is enlarged, without pericardial effusion. There is technically adequate opacification of the pulmonary vasculature, with no filling defects or pulmonary emboli identified. Mediastinum/Nodes: No enlarged mediastinal, hilar, or axillary lymph nodes. Thyroid  gland, trachea, and esophagus demonstrate no significant findings. Lungs/Pleura: There is a trace left pleural effusion. Stable bibasilar scarring, left greater than right. No acute airspace disease. No pneumothorax. Mild bilateral lower lobe bronchiectasis. Central airways are patent. Musculoskeletal: No acute or destructive bony abnormalities. Reconstructed images demonstrate no additional findings. Review of the MIP images confirms the above findings. CTA ABDOMEN AND PELVIS FINDINGS VASCULAR Aorta: Normal caliber aorta without aneurysm, dissection, vasculitis or significant stenosis. Mild atherosclerosis. Celiac: Patent without evidence of aneurysm, dissection, vasculitis or significant stenosis. SMA: Patent without  evidence of aneurysm, dissection, vasculitis or significant stenosis. Renals: Both renal arteries are patent without evidence of aneurysm, dissection, vasculitis, fibromuscular dysplasia or significant stenosis. IMA: Patent without evidence of aneurysm, dissection, vasculitis or significant stenosis. Inflow: Patent without evidence of aneurysm, dissection, vasculitis or significant stenosis. Veins: No obvious venous abnormality within the limitations of this arterial phase study. Review of the MIP images confirms the above findings. NON-VASCULAR Hepatobiliary: No focal liver abnormality is seen. No gallstones, gallbladder wall thickening, or biliary dilatation. Pancreas: Unremarkable. No pancreatic ductal dilatation or surrounding inflammatory changes. Spleen: Normal in size without focal abnormality. Adrenals/Urinary Tract: Adrenal glands are unremarkable. Kidneys are normal, without renal calculi, focal lesion, or hydronephrosis. Bladder is unremarkable. Stomach/Bowel: No bowel obstruction or ileus. Normal appendix right lower quadrant. No bowel wall thickening or inflammatory change. Lymphatic: No pathologic adenopathy within the abdomen or pelvis. Reproductive: Uterus and bilateral adnexa are unremarkable. Other: No free fluid or  free intraperitoneal gas. Small fat containing umbilical hernia. No bowel herniation. Musculoskeletal: No acute or destructive bony abnormalities. Reconstructed images demonstrate no additional findings. Review of the MIP images confirms the above findings. IMPRESSION: Vascular: 1. No evidence of thoracoabdominal aortic aneurysm or dissection. 2. No evidence of pulmonary embolus. 3.  Aortic Atherosclerosis (ICD10-I70.0). Nonvascular: 1. Cardiomegaly. 2. Trace left pleural effusion. 3. Chronic bibasilar scarring and bronchiectasis. 4. No acute intra-abdominal or intrapelvic process. Electronically Signed   By: Ozell Daring M.D.   On: 12/15/2023 17:32   DG Chest 2 View Result Date:  12/15/2023 EXAM: 2 VIEW(S) XRAY OF THE CHEST 12/15/2023 02:44:25 PM COMPARISON: 05/17/2023 CLINICAL HISTORY: CP FINDINGS: LUNGS AND PLEURA: Linear scarring at right lung base. Asymmetric patchy opacities at left lung base. Possible small left pleural effusion. No pneumothorax. HEART AND MEDIASTINUM: Atherosclerotic aortic calcifications. No acute abnormality of the cardiac and mediastinal silhouettes. BONES AND SOFT TISSUES: No acute osseous abnormality. IMPRESSION: 1. Asymmetric patchy opacities at the left lung base with a small left pleural effusion is suspected. Electronically signed by: Norleen Boxer MD 12/15/2023 03:42 PM EST RP Workstation: [id number]    Scheduled Meds:  amLODipine   5 mg Oral Daily   enoxaparin  (LOVENOX ) injection  40 mg Subcutaneous Q24H   folic acid   2 mg Oral Daily   Influenza vac split trivalent PF  0.5 mL Intramuscular Tomorrow-1000   levothyroxine   100 mcg Oral Q0600   pantoprazole  (PROTONIX ) IV  40 mg Intravenous Q12H   sodium chloride  flush  3 mL Intravenous Q12H   Continuous Infusions:  [START ON 12/18/2023] cefTRIAXone  (ROCEPHIN )  IV     potassium chloride 10 mEq (12/17/23 0952)     LOS: 1 day   Ivonne Mustache, MD [facility] P12/11/2023, 12:39 PM

## 2023-12-17 NOTE — Consult Note (Signed)
 Cardiology Consultation   Patient ID: Christy Moore MRN: [id number]; DOB: Oct 30, 1945  Admit date: 12/15/2023 Date of Consult: 12/17/2023  PCP:  Micheal Wolm LELON, MD   [facility] Providers Cardiologist:  Annabella Scarce, MD      Patient Profile: Christy Moore is a 78 y.o. female with a hx of hypertension on amlodipine , Sjogren's, GERD, and esophageal stricture who is being seen 12/17/2023 for the evaluation of chest pain and rhythm concerns at the request of Amrit Adhikari.  History of Present Illness: Christy Moore is a 78 year old female who was admitted on 12/15/2023 with chest pain and fever.  She reports developing sharp chest pain in her central chest when eating.  She tells me it radiates into her back.  Symptoms lasted most of the morning on 12/15/2023.  She was concerned about having a heart attack.  It is somewhat positional.  She tells me morphine  has improved it.  She was febrile on arrival to the ER.  She has a history of esophageal stricture.  GI is evaluating and possibly pursuing an EGD.  Symptoms did not appear to be exertional.  She describes the pain being worse when she leans forward.  Labs notable for potassium of 3.4.  White count has increased to 21.2. Echocardiogram was obtained which shows normal wall motion.  She does have mild pulmonary hypertension. Chest CT shows no evidence of PE.  She does have scarring in the lung bases and concerns for bronchiectasis.  EKG shows sinus rhythm with nonspecific IVCD.  She tells me she has no appetite.  Overnight on 12/16/2023 she developed rapid A-fib.  She was given metoprolol  and had a very prolonged pause.  She then converted to normal sinus rhythm.  The pause was 17 seconds.  She reports not feeling well in A-fib.  She is maintaining sinus rhythm.  On no AV nodal agents.  Gastroenterology is following with plans for possible EGD at some point.  Past Medical History:  Diagnosis Date   Arthritis    Cardiomegaly     Chronic pneumonia    Depression    GERD (gastroesophageal reflux disease)    Hypertension    Hypothyroidism    Raynaud disease    Seizures (HCC)    Sinus tachycardia    Sjogren's syndrome    Status post dilation of esophageal narrowing 2012   Dr. Luis    Past Surgical History:  Procedure Laterality Date   BASAL CELL CARCINOMA EXCISION  2024   CATARACT EXTRACTION, BILATERAL  2024   HAND TENDON SURGERY  02/09/2007   tendon transfer-Dr. Camella   NASAL SEPTUM SURGERY     operated on by Dr. Floy 20 years ago   OVARIAN CYST REMOVAL     Right Sided Salpingo-oopherectomy   SALPINGOOPHORECTOMY     WISDOM TOOTH EXTRACTION       Home Medications:  Prior to Admission medications  Medication Sig Start Date End Date Taking? Authorizing Provider  acetaminophen  (TYLENOL ) 500 MG tablet Take 500 mg by mouth as needed.   Yes [provider]  albuterol  (VENTOLIN  HFA) 108 (90 Base) MCG/ACT inhaler Inhale 1-2 puffs into the lungs every 4 (four) hours as needed for wheezing or shortness of breath.   Yes [provider]  amLODipine  (NORVASC ) 5 MG tablet TAKE 1 TABLET (5 MG TOTAL) BY MOUTH DAILY. 10/12/23  Yes Burchette, Wolm LELON, MD  cyclobenzaprine  (FLEXERIL ) 10 MG tablet Take 1 tablet (10 mg total) by mouth 3 (three) times daily as  needed for muscle spasms. 08/28/23  Yes Johnny Garnette LABOR, MD  famotidine  (PEPCID ) 40 MG tablet TAKE 1 TABLET BY MOUTH EVERYDAY AT BEDTIME 10/12/23  Yes Burchette, Wolm ORN, MD  fluticasone  (FLONASE ) 50 MCG/ACT nasal spray Place 2 sprays into both nostrils daily as needed for allergies.   Yes [provider]  folic acid  (FOLVITE ) 1 MG tablet Take 2 tablets (2 mg total) by mouth daily. 07/22/23  Yes Deveshwar, Maya, MD  levothyroxine  (SYNTHROID ) 100 MCG tablet TAKE 1 TABLET BY MOUTH EVERY DAY 12/09/23  Yes Burchette, Wolm ORN, MD  methotrexate  (RHEUMATREX) 2.5 MG tablet Take 5 tablets (12.5 mg total) by mouth once a week. Patient taking  differently: Take 15 mg by mouth once a week. 11/30/23  Yes Cheryl Waddell HERO, PA-C  predniSONE  (DELTASONE ) 5 MG tablet Take one tablet by mouth once daily for 10 days. Patient taking differently: Take 5 mg by mouth daily as needed. 12/01/23  Yes Burchette, Wolm ORN, MD    Scheduled Meds:  amLODipine   5 mg Oral Daily   enoxaparin  (LOVENOX ) injection  40 mg Subcutaneous Q24H   folic acid   2 mg Oral Daily   Influenza vac split trivalent PF  0.5 mL Intramuscular Tomorrow-1000   levothyroxine   100 mcg Oral Q0600   pantoprazole  (PROTONIX ) IV  40 mg Intravenous Q12H   sodium chloride  flush  3 mL Intravenous Q12H   Continuous Infusions:  sodium chloride      [START ON 12/18/2023] cefTRIAXone  (ROCEPHIN )  IV     potassium chloride 10 mEq (12/17/23 0952)   PRN Meds: acetaminophen  **OR** acetaminophen , albuterol , methocarbamol, morphine  injection, nitroGLYCERIN , ondansetron  **OR** ondansetron  (ZOFRAN ) IV, senna-docusate  Allergies:   Allergies[1]  Social History:   Social History   Socioeconomic History   Marital status: Married    Spouse name: Not on file   Number of children: 2   Years of education: Not on file   Highest education level: Some college, no degree  Occupational History   Occupation: retired  Tobacco Use   Smoking status: Never    Passive exposure: Past   Smokeless tobacco: Never  Vaping Use   Vaping status: Never Used  Substance and Sexual Activity   Alcohol use: Not Currently    Comment: rarely   Drug use: No   Sexual activity: Not on file  Other Topics Concern   Not on file  Social History Narrative   Not on file   Social Drivers of Health   Tobacco Use: Low Risk (12/16/2023)   Patient History    Smoking Tobacco Use: Never    Smokeless Tobacco Use: Never    Passive Exposure: Past  Financial Resource Strain: Low Risk (11/19/2022)   Overall Financial Resource Strain (CARDIA)    Difficulty of Paying Living Expenses: Not hard at all  Food Insecurity: No Food  Insecurity (12/16/2023)   Epic    Worried About Programme Researcher, Broadcasting/film/video in the Last Year: Never true    Ran Out of Food in the Last Year: Never true  Transportation Needs: No Transportation Needs (12/15/2023)   Epic    Lack of Transportation (Medical): No    Lack of Transportation (Non-Medical): No  Physical Activity: Insufficiently Active (11/19/2022)   Exercise Vital Sign    Days of Exercise per Week: 2 days    Minutes of Exercise per Session: 20 min  Stress: No Stress Concern Present (11/19/2022)   [employer] of Occupational Health - Occupational Stress Questionnaire    Feeling of  Stress : Not at all  Social Connections: Socially Integrated (12/15/2023)   Social Connection and Isolation Panel    Frequency of Communication with Friends and Family: More than three times a week    Frequency of Social Gatherings with Friends and Family: Twice a week    Attends Religious Services: More than 4 times per year    Active Member of [employer] or Organizations: Yes    Attends Banker Meetings: More than 4 times per year    Marital Status: Married  Catering Manager Violence: Not At Risk (12/15/2023)   Epic    Fear of Current or Ex-Partner: No    Emotionally Abused: No    Physically Abused: No    Sexually Abused: No  Depression (PHQ2-9): Low Risk (08/27/2023)   Depression (PHQ2-9)    PHQ-2 Score: 1  Alcohol Screen: Low Risk (11/05/2022)   Alcohol Screen    Last Alcohol Screening Score (AUDIT): 0  Housing: Unknown (12/15/2023)   Epic    Unable to Pay for Housing in the Last Year: No    Number of Times Moved in the Last Year: Not on file    Homeless in the Last Year: No  Utilities: Not At Risk (12/15/2023)   Epic    Threatened with loss of utilities: No  Health Literacy: Adequate Health Literacy (11/05/2022)   B1300 Health Literacy    Frequency of need for help with medical instructions: Never    Family History:    Family History  Problem Relation Age of Onset   Cancer  Mother        breast and lung   Stroke Mother 43   Hypothyroidism Mother    Cancer Father        prostate   Heart attack Father 53   Alcohol abuse Maternal Grandmother    Alcohol abuse Maternal Grandfather    Allergies Other        Grandmother-pt states she takes after grandmother who had multiple allergies   Colon cancer Neg Hx    Esophageal cancer Neg Hx    Stomach cancer Neg Hx    Rectal cancer Neg Hx      ROS:  Please see the history of present illness.   All other ROS reviewed and negative.     Physical Exam/Data: Vitals:   12/17/23 0152 12/17/23 0319 12/17/23 0340 12/17/23 0750  BP: 103/73 113/75 107/67 93/65  Pulse: (!) 152 97 85 96  Resp: 15 18 (!) 21 20  Temp: 97.7 F (36.5 C) 98.3 F (36.8 C) 97.8 F (36.6 C) 97.8 F (36.6 C)  TempSrc: Oral Oral Oral Oral  SpO2: 96% 96% 95% 94%  Weight:      Height:        Intake/Output Summary (Last 24 hours) at 12/17/2023 1058 Last data filed at 12/17/2023 0600 Gross per 24 hour  Intake 1470.68 ml  Output --  Net 1470.68 ml      12/15/2023   10:00 PM 12/15/2023    2:18 PM 12/01/2023    9:00 AM  Last 3 Weights  Weight (lbs) 149 lb 7.6 oz 144 lb 6.4 oz 148 lb  Weight (kg) 67.8 kg 65.499 kg 67.132 kg     Body mass index is 25.26 kg/m.  General:  Well nourished, well developed, in no acute distress HEENT: normal Neck: no JVD Vascular: No carotid bruits; Distal pulses 2+ bilaterally Cardiac:  normal S1, S2; RRR; no murmur  Lungs:  clear to auscultation bilaterally, no  wheezing, rhonchi or rales. Abd: soft, nontender, no hepatomegaly. Ext: no edema Musculoskeletal:  No deformities. Skin: warm and dry  Neuro:  no focal abnormalities noted Psych:  Normal affect   EKG:  The EKG was personally reviewed and demonstrates:    Telemetry:  Telemetry was personally reviewed and demonstrates: That the patient went into atrial fibrillation with a variable AV conduction.  The patient received 1 dose of 5 mg IV metoprolol   at 330 am at 3:33 AM the patient had a 17.7 second pause.  After this the patient converted back to sinus rhythm.  Heart rates  Relevant CV Studies:  TTE  IMPRESSIONS   1. Left ventricular ejection fraction, by estimation, is 50 to 55%. The  left ventricle has low normal function. The left ventricle has no regional  wall motion abnormalities. The global longitudinal strain is normal.   2. Right ventricular systolic function is normal. The right ventricular  size is normal. There is severely elevated pulmonary artery systolic  pressure. The estimated right ventricular systolic pressure is 61.0 mmHg.   3. The mitral valve is normal in structure. Mild mitral valve  regurgitation. No evidence of mitral stenosis.   4. Tricuspid valve regurgitation is mild to moderate.   5. The aortic valve is normal in structure. Aortic valve regurgitation is  not visualized. No aortic stenosis is present.   6. The inferior vena cava is normal in size with greater than 50%  respiratory variability, suggesting right atrial pressure of 3 mmHg.   Laboratory Data: High Sensitivity Troponin:  No results for input(s): TROPONINIHS in the last 720 hours.   Chemistry Recent Labs  Lab 12/15/23 1516 12/16/23 0417 12/17/23 0342  NA 138 138 134*  K 5.5* 3.8 3.4*  CL 100 103 100  CO2 27 25 21*  GLUCOSE 153* 109* 174*  BUN 11 8 9   CREATININE 0.89 0.66 0.85  CALCIUM 10.2 8.8* 8.7*  MG  --   --  2.2  GFRNONAA >60 >60 >60  ANIONGAP 11 10 14     No results for input(s): PROT, ALBUMIN, AST, ALT, ALKPHOS, BILITOT in the last 168 hours. Lipids No results for input(s): CHOL, TRIG, HDL, LABVLDL, LDLCALC, CHOLHDL in the last 168 hours.  Hematology Recent Labs  Lab 12/15/23 1516 12/16/23 0417 12/17/23 0342  WBC 10.8* 10.8* 21.2*  RBC 4.40 3.71* 4.00  HGB 13.8 11.6* 12.6  HCT 41.1 35.3* 38.2  MCV 93.4 95.1 95.5  MCH 31.4 31.3 31.5  MCHC 33.6 32.9 33.0  RDW 14.2 14.3 14.4  PLT 255 246  215   Thyroid  No results for input(s): TSH, FREET4 in the last 168 hours.  BNPNo results for input(s): BNP, PROBNP in the last 168 hours.  DDimer No results for input(s): DDIMER in the last 168 hours.  Radiology/Studies:  DG CHEST PORT 1 VIEW Result Date: 12/17/2023 EXAM: XR Chest, 1 View(s). CLINICAL HISTORY: SOB (shortness of breath). COMPARISON: 2 days ago. FINDINGS: LUNGS: Adjacent atelectasis. Minimal right basilar scarring is again noted. PLEURAL SPACES: Small left pleural effusion is noted. No pneumothorax. HEART: The heart size is normal. BONES: No acute osseous abnormality. IMPRESSION: 1. Small left pleural effusion with adjacent atelectasis. 2. Minimal right basilar scarring. Electronically signed by: Lynwood Seip MD 12/17/2023 10:51 AM EST RP Workstation: [id number]   ECHOCARDIOGRAM COMPLETE Result Date: 12/16/2023    ECHOCARDIOGRAM REPORT   Patient Name:   ALLESSANDRA BERNARDI [facility] Date of Exam: 12/16/2023 Medical Rec #:  [id number]  Height:       64.5 in Accession #:    [id number]      Weight:       149.5 lb Date of Birth:  August 25, 1945       BSA:          1.738 m Patient Age:    78 years        BP:           114/54 mmHg Patient Gender: F               HR:           94 bpm. Exam Location:  Inpatient Procedure: 2D Echo, Cardiac Doppler, Color Doppler and Intracardiac            Opacification Agent (Both Spectral and Color Flow Doppler were            utilized during procedure). Indications:    R07.9* Chest pain, unspecified  History:        Patient has prior history of Echocardiogram examinations.                 Cardiomegaly, Signs/Symptoms:Fever; Risk Factors:Hypertension.  Sonographer:    Ellouise Mose RDCS Referring Phys: [id number] JORIE SAUNDERS PATEL  Sonographer Comments: Study delayed for consult. IMPRESSIONS  1. Left ventricular ejection fraction, by estimation, is 50 to 55%. The left ventricle has low normal function. The left ventricle has no regional wall motion abnormalities. The global  longitudinal strain is normal.  2. Right ventricular systolic function is normal. The right ventricular size is normal. There is severely elevated pulmonary artery systolic pressure. The estimated right ventricular systolic pressure is 61.0 mmHg.  3. The mitral valve is normal in structure. Mild mitral valve regurgitation. No evidence of mitral stenosis.  4. Tricuspid valve regurgitation is mild to moderate.  5. The aortic valve is normal in structure. Aortic valve regurgitation is not visualized. No aortic stenosis is present.  6. The inferior vena cava is normal in size with greater than 50% respiratory variability, suggesting right atrial pressure of 3 mmHg. FINDINGS  Left Ventricle: Left ventricular ejection fraction, by estimation, is 50 to 55%. The left ventricle has low normal function. The left ventricle has no regional wall motion abnormalities. Definity contrast agent was given IV to delineate the left ventricular endocardial borders. Strain was performed and the global longitudinal strain is normal. The left ventricular internal cavity size was normal in size. There is no left ventricular hypertrophy. Abnormal (paradoxical) septal motion, consistent with left bundle branch block. Right Ventricle: The right ventricular size is normal. No increase in right ventricular wall thickness. Right ventricular systolic function is normal. There is severely elevated pulmonary artery systolic pressure. The tricuspid regurgitant velocity is 3.39 m/s, and with an assumed right atrial pressure of 15 mmHg, the estimated right ventricular systolic pressure is 61.0 mmHg. Left Atrium: Left atrial size was normal in size. Right Atrium: Right atrial size was normal in size. Pericardium: There is no evidence of pericardial effusion. Mitral Valve: The mitral valve is normal in structure. Mild mitral valve regurgitation. No evidence of mitral valve stenosis. MV peak gradient, 9.2 mmHg. The mean mitral valve gradient is 5.5 mmHg.  Tricuspid Valve: The tricuspid valve is normal in structure. Tricuspid valve regurgitation is mild to moderate. No evidence of tricuspid stenosis. Aortic Valve: The aortic valve is normal in structure. Aortic valve regurgitation is not visualized. No aortic stenosis is present. Aortic valve mean gradient measures 10.5 mmHg. Aortic  valve peak gradient measures 18.5 mmHg. Aortic valve area, by VTI measures 1.73 cm. Pulmonic Valve: The pulmonic valve was normal in structure. Pulmonic valve regurgitation is not visualized. No evidence of pulmonic stenosis. Aorta: The aortic root is normal in size and structure. Venous: The inferior vena cava is normal in size with greater than 50% respiratory variability, suggesting right atrial pressure of 3 mmHg. IAS/Shunts: No atrial level shunt detected by color flow Doppler.  LEFT VENTRICLE PLAX 2D LVIDd:         4.80 cm     Diastology LVIDs:         3.40 cm     LV e' medial:    16.10 cm/s LV PW:         1.30 cm     LV E/e' medial:  8.0 LV IVS:        0.80 cm     LV e' lateral:   11.10 cm/s LVOT diam:     2.00 cm     LV E/e' lateral: 11.6 LV SV:         62 LV SV Index:   36 LVOT Area:     3.14 cm  LV Volumes (MOD) LV vol d, MOD A2C: 74.7 ml LV vol d, MOD A4C: 88.5 ml LV vol s, MOD A2C: 34.0 ml LV vol s, MOD A4C: 46.6 ml LV SV MOD A2C:     40.8 ml LV SV MOD A4C:     88.5 ml LV SV MOD BP:      41.3 ml RIGHT VENTRICLE              IVC RV S prime:     155.85 cm/s  IVC diam: 1.20 cm TAPSE (M-mode): 2.8 cm                              PULMONARY VEINS                              Diastolic Velocity: 60.20 cm/s                              S/D Velocity:       1.40                              Systolic Velocity:  83.30 cm/s LEFT ATRIUM           Index        RIGHT ATRIUM           Index LA diam:      2.40 cm 1.38 cm/m   RA Area:     16.70 cm LA Vol (A2C): 24.7 ml 14.21 ml/m  RA Volume:   38.75 ml  22.30 ml/m LA Vol (A4C): 44.1 ml 25.38 ml/m  AORTIC VALVE AV Area (Vmax):    1.86 cm  AV Area (Vmean):   1.78 cm AV Area (VTI):     1.73 cm AV Vmax:           215.00 cm/s AV Vmean:          152.500 cm/s AV VTI:            0.360 m AV Peak Grad:      18.5 mmHg AV Mean Grad:  10.5 mmHg LVOT Vmax:         127.50 cm/s LVOT Vmean:        86.600 cm/s LVOT VTI:          0.198 m LVOT/AV VTI ratio: 0.55  AORTA Ao Root diam: 2.90 cm Ao Asc diam:  3.30 cm MITRAL VALVE                TRICUSPID VALVE MV Area (PHT): 5.02 cm     TR Peak grad:   46.0 mmHg MV Area VTI:   1.95 cm     TR Vmax:        339.00 cm/s MV Peak grad:  9.2 mmHg MV Mean grad:  5.5 mmHg     SHUNTS MV Vmax:       1.52 m/s     Systemic VTI:  0.20 m MV Vmean:      113.0 cm/s   Systemic Diam: 2.00 cm MV Decel Time: 151 msec MR Peak grad: 96.4 mmHg MR Mean grad: 45.0 mmHg MR Vmax:      491.00 cm/s MR Vmean:     306.0 cm/s MV E velocity: 129.00 cm/s MV A velocity: 136.00 cm/s MV E/A ratio:  0.95 Christy Parchment MD Electronically signed by Christy Parchment MD Signature Date/Time: 12/16/2023/12:44:02 PM    Final    CT Angio Chest/Abd/Pel for Dissection W and/or Wo Contrast Result Date: 12/15/2023 CLINICAL DATA:  Chest pain radiating to back and throat, nausea since this morning, difficulty breathing EXAM: CT ANGIOGRAPHY CHEST, ABDOMEN AND PELVIS TECHNIQUE: Non-contrast CT of the chest was initially obtained. Multidetector CT imaging through the chest, abdomen and pelvis was performed using the standard protocol during bolus administration of intravenous contrast. Multiplanar reconstructed images and MIPs were obtained and reviewed to evaluate the vascular anatomy. RADIATION DOSE REDUCTION: This exam was performed according to the departmental dose-optimization program which includes automated exposure control, adjustment of the mA and/or kV according to patient size and/or use of iterative reconstruction technique. CONTRAST:  OMNIPAQUE  IOHEXOL  350 MG/ML SOLN COMPARISON:  12/15/2023, 07/01/2023 FINDINGS: CTA CHEST FINDINGS Cardiovascular: No  evidence of thoracic aortic aneurysm or dissection. Atherosclerosis of the thoracic aorta. Average origin of the right subclavian artery is noted, a frequent anatomic variant. The heart is enlarged, without pericardial effusion. There is technically adequate opacification of the pulmonary vasculature, with no filling defects or pulmonary emboli identified. Mediastinum/Nodes: No enlarged mediastinal, hilar, or axillary lymph nodes. Thyroid  gland, trachea, and esophagus demonstrate no significant findings. Lungs/Pleura: There is a trace left pleural effusion. Stable bibasilar scarring, left greater than right. No acute airspace disease. No pneumothorax. Mild bilateral lower lobe bronchiectasis. Central airways are patent. Musculoskeletal: No acute or destructive bony abnormalities. Reconstructed images demonstrate no additional findings. Review of the MIP images confirms the above findings. CTA ABDOMEN AND PELVIS FINDINGS VASCULAR Aorta: Normal caliber aorta without aneurysm, dissection, vasculitis or significant stenosis. Mild atherosclerosis. Celiac: Patent without evidence of aneurysm, dissection, vasculitis or significant stenosis. SMA: Patent without evidence of aneurysm, dissection, vasculitis or significant stenosis. Renals: Both renal arteries are patent without evidence of aneurysm, dissection, vasculitis, fibromuscular dysplasia or significant stenosis. IMA: Patent without evidence of aneurysm, dissection, vasculitis or significant stenosis. Inflow: Patent without evidence of aneurysm, dissection, vasculitis or significant stenosis. Veins: No obvious venous abnormality within the limitations of this arterial phase study. Review of the MIP images confirms the above findings. NON-VASCULAR Hepatobiliary: No focal liver abnormality is seen. No gallstones, gallbladder wall thickening, or biliary dilatation. Pancreas:  Unremarkable. No pancreatic ductal dilatation or surrounding inflammatory changes. Spleen: Normal  in size without focal abnormality. Adrenals/Urinary Tract: Adrenal glands are unremarkable. Kidneys are normal, without renal calculi, focal lesion, or hydronephrosis. Bladder is unremarkable. Stomach/Bowel: No bowel obstruction or ileus. Normal appendix right lower quadrant. No bowel wall thickening or inflammatory change. Lymphatic: No pathologic adenopathy within the abdomen or pelvis. Reproductive: Uterus and bilateral adnexa are unremarkable. Other: No free fluid or free intraperitoneal gas. Small fat containing umbilical hernia. No bowel herniation. Musculoskeletal: No acute or destructive bony abnormalities. Reconstructed images demonstrate no additional findings. Review of the MIP images confirms the above findings. IMPRESSION: Vascular: 1. No evidence of thoracoabdominal aortic aneurysm or dissection. 2. No evidence of pulmonary embolus. 3.  Aortic Atherosclerosis (ICD10-I70.0). Nonvascular: 1. Cardiomegaly. 2. Trace left pleural effusion. 3. Chronic bibasilar scarring and bronchiectasis. 4. No acute intra-abdominal or intrapelvic process. Electronically Signed   By: Ozell Daring M.D.   On: 12/15/2023 17:32   DG Chest 2 View Result Date: 12/15/2023 EXAM: 2 VIEW(S) XRAY OF THE CHEST 12/15/2023 02:44:25 PM COMPARISON: 05/17/2023 CLINICAL HISTORY: CP FINDINGS: LUNGS AND PLEURA: Linear scarring at right lung base. Asymmetric patchy opacities at left lung base. Possible small left pleural effusion. No pneumothorax. HEART AND MEDIASTINUM: Atherosclerotic aortic calcifications. No acute abnormality of the cardiac and mediastinal silhouettes. BONES AND SOFT TISSUES: No acute osseous abnormality. IMPRESSION: 1. Asymmetric patchy opacities at the left lung base with a small left pleural effusion is suspected. Electronically signed by: Norleen Boxer MD 12/15/2023 03:42 PM EST RP Workstation: [id number]     Assessment and Plan:  # Afib/flutter with RVR # Prolonged pause, 17 seconds - She developed rapid  atrial fibrillation or flutter overnight on 12/16/2023.  Was given 5 mg of IV metoprolol .  Did had a 17-second pause.  She reports not feeling well.  She then converted to normal sinus rhythm shortly after the pause.  She has had no further recurrence of arrhythmia. - Suspect a prolonged pause was related to metoprolol .  Her A-fib is driven by sepsis.  Would recommend to hold any AV nodal agents.  If this occurs again would likely pursue a short course of amiodarone therapy.  I believe infection is driving her A-fib. - Regarding anticoagulation this is indicated.  Plans for possible EGD by GI.  Would hold anticoagulation until no further procedures are planned.  # Chest pain, noncardiac - Describes sharp chest discomfort that started when eating.  Has a known history of esophageal stricture.  Here with fever and infection has not been identified. - EKG is nonischemic.  Troponins are negative.  Chest CT reviewed no coronary calcium. - She does have bronchiectasis.  I do wonder if this is pneumonia.  Continue antibiotics per primary team. - She can pursue an outpatient coronary CTA.  Nothing needed right now.  # Leukocytosis # Fever  # Acute hypoxic respiratory failure # Bronchiectasis - Urine cultures blood cultures negative.  Respiratory panel negative.  On antibiotics.  Further workup per hospital team.  Unclear etiology. - Possibly she needs a bronchoscopy?  I will defer this to hospital team.  For questions or updates, please contact [facility] Please consult [email or web address] for contact info under    Signed, Darryle T. Barbaraann, MD, [facility]  [facility]  [address] [place], [place] [address] [phone]  12:02 PM      [1]  Allergies Allergen Reactions   Albuterol -Budesonide Other (See Comments)    Burned her tongue  albuterol  / budesonide   Amoxicillin -Pot Clavulanate     REACTION: nausea   Azithromycin      REACTION: nausea, Gi  Other Reaction(s):  Unknown   Broccoli [Brassica Oleracea]    Celecoxib Other (See Comments)    REACTION: nausea  celecoxib   Ciprofloxacin Other (See Comments)    REACTION: nausea  ciprofloxacin   Doxycycline  Other (See Comments)    REACTION: gi upset   Erythromycin Other (See Comments)    REACTION: nause, GI   Hydrocodone Other (See Comments)    REACTION: nausea   Hydrocodone-Acetaminophen      REACTION: GI upset   Levofloxacin Other (See Comments)    REACTION: she claims renal failiure  levofloxacin   Omeprazole  Nausea And Vomiting and Other (See Comments)    omeprazole    Penicillins    Propoxyphene N-Acetaminophen     Rofecoxib     REACTION: nausea   Sulfa Antibiotics    Tetanus Toxoid

## 2023-12-17 NOTE — Progress Notes (Signed)
°   12/17/23 1459  TOC Brief Assessment  Insurance and Status Reviewed  Patient has primary care physician Yes  Home environment has been reviewed single family home  Prior level of function: modified independent  Prior/Current Home Services Current home services (DME (single-point cane and RW))  Social Drivers of Health Review SDOH reviewed no interventions necessary  Readmission risk has been reviewed Yes  Transition of care needs transition of care needs identified, TOC will continue to follow    PT evaluation completed, no follow up recommended. Pt has RW and single-point cane at home. Pt currently on 2L of O2. Pt reports no O2 at baseline. TOC will continue to follow.  Signed: Heather Saltness, MSW, LCSW Clinical Social Worker Inpatient Care Management 12/17/2023 3:01 PM

## 2023-12-17 NOTE — Progress Notes (Addendum)
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  Also reported shortness of breath, cough, subjective fever at home.  On arrival to the emergency room she was tachycardic, found to have a fever of 101.6, tachypneic, hypoxic and had to be placed on 2 L of oxygen.  Lab work was significant for potassium of 5.5, lactate of 2.1.  CT chest/ abdomen/ pelvis showed cardiomegaly trace left pleural effusion, chronic basilar scarring/bronchiectasis, no evidence of PE.  Patient was started on broad-spectrum antibiotics IV fluids.  GI consulted for suspicion of severe GERD/esophagitis.  GI recommended Protonix  40 mg IV twice daily, and patient scheduled for an EGD with dilation tomorrow with Dr. Abran around 2 PM.    Events of note: 2220 RN notified patient experiencing chest pain 9 out of 10 with heart rate sustaining in the 140s.  RN had already given dose of morphine .  Maalox and lidocaine  ordered as well as a one-time dose of morphine .  1240: RN reports patient's heart rate is maintaining in the 80s  0200: RN reported patient heart rate has become elevated again daily in the 150s blood pressure stable at 113/73 EKG obtained 500 cc  bolus of normal saline ordered, Mag added to morning labs and requested lab draw when able.  Patient declines pain at this   0330: Patient continues to be tachycardic blood pressure stable declines pain at this time.  Second bolus of 500 cc normal saline ordered as well as 5 mg IV metoprolol   0335: RN notified of vfib on monitor. Rapid called. I reported to bedside and assessed patient. Pt was alert and oriented at this time,  EKG obtained which was NSR 80s. CBG obtained 172. RN reports almost immediately after administering the IV metoprolol  pt went into V.FIB. RN reports patient never lost a pulse but did roll eyes back. Uploaded in media tab vfib event and a flutter captured on tele before she returned to NSR.  Interventions/ Plan   EKG- NSR with no st elevation or changes  2 ( 500 cc NS bolus)  5 mg IV metoprolol       Christy Moore BSN RN CCRN AGACNP-BC Acute Care Nurse Practitioner [facility]

## 2023-12-18 ENCOUNTER — Other Ambulatory Visit (HOSPITAL_COMMUNITY): Payer: Self-pay

## 2023-12-18 ENCOUNTER — Ambulatory Visit: Payer: Self-pay | Admitting: Pulmonary Disease

## 2023-12-18 DIAGNOSIS — R1310 Dysphagia, unspecified: Secondary | ICD-10-CM | POA: Diagnosis not present

## 2023-12-18 DIAGNOSIS — R6510 Systemic inflammatory response syndrome (SIRS) of non-infectious origin without acute organ dysfunction: Secondary | ICD-10-CM | POA: Diagnosis not present

## 2023-12-18 DIAGNOSIS — I4891 Unspecified atrial fibrillation: Secondary | ICD-10-CM | POA: Diagnosis not present

## 2023-12-18 DIAGNOSIS — R079 Chest pain, unspecified: Secondary | ICD-10-CM | POA: Diagnosis not present

## 2023-12-18 DIAGNOSIS — R0789 Other chest pain: Secondary | ICD-10-CM | POA: Diagnosis not present

## 2023-12-18 LAB — CBC
HCT: 35.4 % — ABNORMAL LOW (ref 36.0–46.0)
Hemoglobin: 11.7 g/dL — ABNORMAL LOW (ref 12.0–15.0)
MCH: 31.3 pg (ref 26.0–34.0)
MCHC: 33.1 g/dL (ref 30.0–36.0)
MCV: 94.7 fL (ref 80.0–100.0)
Platelets: 172 K/uL (ref 150–400)
RBC: 3.74 MIL/uL — ABNORMAL LOW (ref 3.87–5.11)
RDW: 14.3 % (ref 11.5–15.5)
WBC: 10.5 K/uL (ref 4.0–10.5)
nRBC: 0 % (ref 0.0–0.2)

## 2023-12-18 LAB — BASIC METABOLIC PANEL WITH GFR
Anion gap: 9 (ref 5–15)
BUN: 14 mg/dL (ref 8–23)
CO2: 26 mmol/L (ref 22–32)
Calcium: 8.5 mg/dL — ABNORMAL LOW (ref 8.9–10.3)
Chloride: 104 mmol/L (ref 98–111)
Creatinine, Ser: 0.81 mg/dL (ref 0.44–1.00)
GFR, Estimated: >60 mL/min (ref >60)
Glucose, Bld: 104 mg/dL — ABNORMAL HIGH (ref 70–99)
Potassium: 3.6 mmol/L (ref 3.5–5.1)
Sodium: 139 mmol/L (ref 135–145)

## 2023-12-18 MED ORDER — ENSURE PLUS HIGH PROTEIN PO LIQD
237.0000 mL | Freq: Two times a day (BID) | ORAL | 1 refills | Status: DC
  Filled 2023-12-18: qty 14220, 30d supply, fill #0

## 2023-12-18 MED ORDER — NITROGLYCERIN 0.4 MG SL SUBL
0.4000 mg | SUBLINGUAL_TABLET | SUBLINGUAL | 0 refills | Status: AC | PRN
  Filled 2023-12-18: qty 20, 7d supply, fill #0

## 2023-12-18 MED ORDER — METOPROLOL TARTRATE 25 MG PO TABS
25.0000 mg | ORAL_TABLET | Freq: Two times a day (BID) | ORAL | 1 refills | Status: AC
  Filled 2023-12-18: qty 60, 30d supply, fill #0
  Filled 2024-01-22: qty 60, 30d supply, fill #1

## 2023-12-18 MED ORDER — SENNOSIDES-DOCUSATE SODIUM 8.6-50 MG PO TABS
1.0000 | ORAL_TABLET | Freq: Every evening | ORAL | 0 refills | Status: AC | PRN
  Filled 2023-12-18: qty 20, 20d supply, fill #0

## 2023-12-18 MED ORDER — ACETAMINOPHEN 325 MG PO TABS: 650.0000 mg | ORAL_TABLET | Freq: Four times a day (QID) | ORAL | Status: DC | PRN

## 2023-12-18 MED ORDER — CEFDINIR 300 MG PO CAPS
300.0000 mg | ORAL_CAPSULE | Freq: Two times a day (BID) | ORAL | 0 refills | Status: AC
  Filled 2023-12-18: qty 10, 5d supply, fill #0

## 2023-12-18 MED ORDER — METOPROLOL TARTRATE 25 MG PO TABS
25.0000 mg | ORAL_TABLET | Freq: Two times a day (BID) | ORAL | Status: DC
  Administered 2023-12-18: 25 mg via ORAL
  Filled 2023-12-18: qty 1

## 2023-12-18 MED ORDER — PANTOPRAZOLE SODIUM 40 MG PO TBEC
40.0000 mg | DELAYED_RELEASE_TABLET | Freq: Every day | ORAL | 0 refills | Status: DC
  Filled 2023-12-18: qty 30, 30d supply, fill #0

## 2023-12-18 NOTE — Telephone Encounter (Signed)
 FYI Only or Action Required?: Action required by provider: request for appointment.  Patient is followed in Pulmonology for Sjogren's syndrome, last seen on 09/30/2023 by Christy Dorn NOVAK, Christy Moore.  Called Nurse Triage reporting Advice Only.  Triage Disposition: Call PCP Within 24 Hours  Patient/caregiver understands and will follow disposition?: Yes  Copied from CRM #[id number]. Topic: Clinical - Red Word Triage >> Dec 18, 2023  2:07 PM Christy Moore: Red Word that prompted transfer to Nurse Triage: SOB/Pain when breathing: Patient has been at [facility] since Tuesday, they checked her heart and found no issue. Her lungs were also checked and they found nothing new, she would like to follow-up with her pulmonologist Dr. Kara as soon as possible. She was given a flutter-valve in the hospital along with antibiotics. Reason for Disposition  [1] Caller requests to speak ONLY to PCP AND [2] NON-URGENT question  Answer Assessment - Initial Assessment Questions 1. REASON FOR CALL or QUESTION: What is your reason for calling today? or How can I best     Patient is currently in the hospital for shortness of breath. Calling to make a follow up appointment with Dr. Kara. Patient is requesting a call back from the office for scheduling.  2. CALLER: Document the source of call. (e.g., laboratory staff, caregiver or patient).     patient  Protocols used: PCP Call - No Triage-A-AH

## 2023-12-18 NOTE — Plan of Care (Signed)

## 2023-12-18 NOTE — Progress Notes (Addendum)
 Progress Note   Subjective  Hospital day #4 Chief Complaint: Esophagitis/GERD and atypical chest pain as well as SIRS  This morning, patient tells me she is doing a little bit better.  No longer on oxygen via nasal cannula.  Starting to feel slightly better.  She did her husband have questions about possible EGD versus waiting until she recovers from whatever this is.  CBC with a normal white count this morning.  No longer requiring oxygen.  Yesterday had a prolonged episode of A-fib and a prolonged pause requiring metoprolol  and now in sinus rhythm.    Objective   Vital signs in last 24 hours: Temp:  [98.3 F (36.8 C)-99.3 F (37.4 C)] 99.2 F (37.3 C) (12/12 0332) Pulse Rate:  [89-105] 105 (12/12 0332) Resp:  [16-20] 16 (12/12 0332) BP: (102-120)/(55-80) 120/62 (12/12 0332) SpO2:  [93 %-100 %] 93 % (12/12 0332) Last BM Date : 12/17/23 General:    Elderly white female in NAD Heart:  Regular rate and rhythm; no murmurs Lungs: Respirations even and unlabored, lungs CTA bilaterally Abdomen:  Soft, nontender and nondistended. Normal bowel sounds. Extremities:  Without edema. Neurologic:  Alert and oriented,  grossly normal neurologically. Psych:  Cooperative. Normal mood and affect.  Intake/Output from previous day: 12/11 0701 - 12/12 0700 In: 400 [P.O.:360; IV Piggyback:40] Out: -    Lab Results: Recent Labs    12/16/23 0417 12/17/23 0342 12/18/23 0351  WBC 10.8* 21.2* 10.5  HGB 11.6* 12.6 11.7*  HCT 35.3* 38.2 35.4*  PLT 246 215 172   BMET Recent Labs    12/16/23 0417 12/17/23 0342 12/18/23 0351  NA 138 134* 139  K 3.8 3.4* 3.6  CL 103 100 104  CO2 25 21* 26  GLUCOSE 109* 174* 104*  BUN 8 9 14   CREATININE 0.66 0.85 0.81  CALCIUM 8.8* 8.7* 8.5*   Studies/Results: DG CHEST PORT 1 VIEW Result Date: 12/17/2023 EXAM: XR Chest, 1 View(s). CLINICAL HISTORY: SOB (shortness of breath). COMPARISON: 2 days ago. FINDINGS: LUNGS: Adjacent atelectasis.  Minimal right basilar scarring is again noted. PLEURAL SPACES: Small left pleural effusion is noted. No pneumothorax. HEART: The heart size is normal. BONES: No acute osseous abnormality. IMPRESSION: 1. Small left pleural effusion with adjacent atelectasis. 2. Minimal right basilar scarring. Electronically signed by: Lynwood Seip MD 12/17/2023 10:51 AM EST RP Workstation: [id number]    Assessment / Plan:   Assessment: 1.  Atypical chest pain: Complained of central chest pain, troponins negative x 2, EKG not significantly changed from previous, CT of the chest unrevealing for acute etiology, echo with LVEF 50-55% 12/16/2023 and severely elevated pulmonary artery systolic pressure, chest pain most likely musculoskeletal in nature given no association with eating, also describes what sounds like a spasm worsened by leaning forward, no Odynophagia, some dysphagia; consider relation to Sjogren's and dysmotility +/- spasm +/- stricture +/- GERD +/- opportunistic infection given methotrexate  use 2.  SIRS: Initially with tachycardia and fever and leukocytosis, no obvious infectious source other than potentially exacerbation of bronchiectasis, COVID/flu/RSV negative, antibiotics continued on IV Ceftriaxone , white count back to normal this morning with no further fevers and now no longer requiring supplemental oxygen 3.  Acute respiratory failure with hypoxia: In ED required 2 L O2 via nasal cannula, CT of the chest negative for PE or consolidation, trace left pleural effusion and chronic bibasilar scarring bronchiectasis, bibasilar inspiratory crackles on exam and otherwise lungs clear, this is better, started on IV ceftriaxone  Albuterol , initially required supplemental  O2 changed to room air, overnight acute events on 12/17/2023 with worsening chest pain, prolonged episode of A-fib and worsening leukocytosis requiring oxygen yesterday, better today 4.  Sjogren syndrome/rheumatoid arthritis: Typically on Methotrexate   which is on hold  Plan: 1.  Continue antibiotics 2.  Continue Pantoprazole  40 mg IV twice daily 3.  Continue regular diet 4.  See Dr. Nancyann note yesterday, no immediate plans for endoscopy.  This dysphagia could be addressed outpatient when she has recovered from acute issues.  Did discuss this with the patient and her husband today.  They are okay with this plan.  Thank you for your kind consultation.   LOS: 2 days   Delon Hendricks Failing  12/18/2023, 11:12 AM  GI ATTENDING  Interval history and data reviewed.  Patient seen and examined as outlined above.  Agree with interval progress note.  At this point, the primary service will address her more acute problems and discharge her when they see fit.  We will arrange for outpatient follow-up with her primary gastroenterologist, Dr. Philippa, in about 6 weeks.  Will sign off.  Norleen SAILOR. Abran Raddle., M.D. [facility] Division of Gastroenterology

## 2023-12-18 NOTE — Progress Notes (Signed)
 Cardiology Progress Note  Patient ID: Christy Moore MRN: [id number] DOB: 1945-09-05 Date of Encounter: 12/18/2023 Primary Cardiologist: Annabella Scarce, MD  Subjective   Chief Complaint: none.   HPI: Denies CP or SOB. No further afib.   ROS:  All other ROS reviewed and negative. Pertinent positives noted in the HPI.     Telemetry  Overnight telemetry shows ST 100s, which I personally reviewed.     Physical Exam   Vitals:   12/17/23 1447 12/17/23 1739 12/17/23 2007 12/18/23 0332  BP: (!) 102/55 104/80 (!) 105/57 120/62  Pulse: 89 93 100 (!) 105  Resp: 20 20 19 16   Temp: 98.3 F (36.8 C) 98.6 F (37 C) 99.3 F (37.4 C) 99.2 F (37.3 C)  TempSrc:  Oral Oral Oral  SpO2: 98% 100% 97% 93%  Weight:      Height:        Intake/Output Summary (Last 24 hours) at 12/18/2023 1015 Last data filed at 12/17/2023 1523 Gross per 24 hour  Intake 399.95 ml  Output --  Net 399.95 ml       12/15/2023   10:00 PM 12/15/2023    2:18 PM 12/01/2023    9:00 AM  Last 3 Weights  Weight (lbs) 149 lb 7.6 oz 144 lb 6.4 oz 148 lb  Weight (kg) 67.8 kg 65.499 kg 67.132 kg    Body mass index is 25.26 kg/m.  General: Well nourished, well developed, in no acute distress Head: Atraumatic, normal size  Eyes: PEERLA, EOMI  Neck: Supple, no JVD Endocrine: No thryomegaly Cardiac: Normal S1, S2; RRR; no murmurs, rubs, or gallops Lungs: Clear to auscultation bilaterally, no wheezing, rhonchi or rales  Abd: Soft, nontender, no hepatomegaly  Ext: No edema, pulses 2+ Musculoskeletal: No deformities, BUE and BLE strength normal and equal Skin: Warm and dry, no rashes   Neuro: Alert and oriented to person, place, time, and situation, CNII-XII grossly intact, no focal deficits  Psych: Normal mood and affect   Cardiac Studies  TTE 12/16/2023  1. Left ventricular ejection fraction, by estimation, is 50 to 55%. The  left ventricle has low normal function. The left ventricle has no regional  wall  motion abnormalities. The global longitudinal strain is normal.   2. Right ventricular systolic function is normal. The right ventricular  size is normal. There is severely elevated pulmonary artery systolic  pressure. The estimated right ventricular systolic pressure is 61.0 mmHg.   3. The mitral valve is normal in structure. Mild mitral valve  regurgitation. No evidence of mitral stenosis.   4. Tricuspid valve regurgitation is mild to moderate.   5. The aortic valve is normal in structure. Aortic valve regurgitation is  not visualized. No aortic stenosis is present.   6. The inferior vena cava is normal in size with greater than 50%  respiratory variability, suggesting right atrial pressure of 3 mmHg.   Patient Profile  Christy Moore is a 78 y.o. female with hypertension, Sjogren syndrome, acid reflux, esophageal stricture admitted on 12/15/2023 with chest pain and fever.  Cardiology was consulted for new onset A-fib.  Assessment & Plan   # New onset A-fib with RVR # Postconversion pause - Developed rapid A-fib overnight on 8 12/16/2023.  Given metoprolol .  Converted back to rhythm but did have a long pause. - Maintaining sinus rhythm. - Would add metoprolol  to tartrate 25 mg twice daily. - If no plans for EGD cancer Eliquis 5 mg twice daily.  We will arrange outpatient  follow-up.  # Chest pain, noncardiac - Normal echo.  Suspect this is GI related.  Defer to them on timing of EGD.  # Leukocytosis # Fever # Acute hypoxic respiratory failure # Bronchiectasis - Antibiotics per primary team.  [facility] will sign off.   The patient is ready for discharge today from a cardiac standpoint. Medication Recommendations: As above Other recommendations (labs, testing, etc): None Follow up as an outpatient: 2 to 4 weeks with Dr. Raford or APP.  For questions or updates, please contact [facility] Please consult [email or web address] for contact info under         Signed, Darryle T. Barbaraann, MD, [facility]  [facility]  12/18/2023 10:15 AM

## 2023-12-18 NOTE — Discharge Summary (Signed)
 Physician Discharge Summary  Patient ID: Christy Moore MRN: [id number] DOB/AGE: 1945-01-24 78 y.o.  Admit date: 12/15/2023 Discharge date: 12/18/2023  Admission Diagnoses:  Discharge Diagnoses:  Principal Problem:   SIRS (systemic inflammatory response syndrome) (HCC) Active Problems:   Hypothyroidism   Hypertension   Sjogren's disease   Acute respiratory failure with hypoxia (HCC)   Sepsis (HCC)   Atypical chest pain   Dysphagia Atrial fibrillation/Atrial flutter   Discharged Condition: stable  Hospital Course:  Patient is a 78 year old female with history of Sjogren syndrome, rheumatoid  arthritis, ILD, bronchiectasis, hypertension, hypothyroidism, cardiomegaly, esophageal stricture s/p dilatation, MGUS who presented with central chest pain radiating to her back while watching TV.  Also reported shortness of breath, cough and subjective fever at home.  And presentation, she was  in mild sinus tachycardia,  found to have a fever of 101.6, tachypneic, tachycardic, hypoxic and had to be placed on 2 L of oxygen.  Lab work showed potassium 5.5, lactate of 2.1.  COVID/flu/RSV negative.  UA not suspicious for UTI.  Blood cultures collected.  CT chest/abdomen/pelvis showed cardiomegaly, trace left pleural effusion, chronic bibasilar scarring/bronchiectasis, no evidence of PE.  Patient started on antibiotics.  GI consulted  for suspicion of severe GERD/esophagitis , plan for EGD.  She  also went into new onset A-fib/cardiology consulted.  SIRS: Presented with fever, tachycardia, tachypnea.  Mild elevated lactate which has resolved.  No obvious infectious process identified.  COVID/flu/RSV negative.  CT chest/abdomen/pelvis unrevealing.  Follow-up cultures, no growth till date.  Respiratory viral panel has been negative.   Leucocytosis worsened today.  Chest x-ray follow-up showed small left pleural effusion with adjacent atelectasis,minimal right basilar scarring.  Continue antibiotics.  She is  afebrile.  She is allergic to azithromycin  or doxycycline  so currently on ceftriaxone  only .   Acute hypoxic respiratory failure/history of bronchiectasis: Presented with hypoxia.  CTA chest negative for PE or pneumonia.  Showed trace left pleural effusion, chronic bibasilar scarring, bronchiectasis.  Could be exacerbation of bronchiectasis as well.  History of Sjogren syndrome.  Continue current antibiotic, bronchodilators.  Chest x-ray follow-up did not show any new pneumonia.   Chest pain/new onset A-fib: Last night she went into rapid A-fib.  She was given metoprolol  that trigger prolonged pause of 17 seconds.  This morning she is in normal sinus rhythm.  Cardiology following.  Currently holding AV nodal agents.  There is planning to start on Eliquis  after EGD.  She continues to complain of vague chest pain which is likely seems musculoskeletal.  CT chest on admission was unremarkable.  Continue muscle relaxants. Echo done here showed EF of 50 to 55%, no wall motion abnormality.   Epigastric pain/history of GERD/esophageal stricture:   Has history of reflux, esophageal stricture s/p dilatation.  Pain is mainly in the epigastric region, burning in nature.  Also has pain on the muscles of the neck.  Will continue Protonix .  Continue muscle relaxants.  GI consulted.  Plan for EGD    Hypertension: On amlodipine  at home.  Blood pressure    Hyperkalemia: Resolved after given Lokelma .   Hypothyroidism: On Synthyroid   History of Sjogren's syndrome/RA: Home methotrexate  on hold   Debility/deconditioning: Consulted PT     Consults: cardiology and GI  As per cardiology team: # Afib/flutter with RVR # Prolonged pause, 17 seconds - She developed rapid atrial fibrillation or flutter overnight on 12/16/2023.  Was given 5 mg of IV metoprolol .  Did had a 17-second pause.  She  reports not feeling well.  She then converted to normal sinus rhythm shortly after the pause.  She has had no further  recurrence of arrhythmia. - Suspect a prolonged pause was related to metoprolol .  Her A-fib is driven by sepsis.  Would recommend to hold any AV nodal agents.  If this occurs again would likely pursue a short course of amiodarone  therapy.  I believe infection is driving her A-fib. - Regarding anticoagulation this is indicated.  Plans for possible EGD by GI.  Would hold anticoagulation until no further procedures are planned.   # Chest pain, noncardiac - Describes sharp chest discomfort that started when eating.  Has a known history of esophageal stricture.  Here with fever and infection has not been identified. - EKG is nonischemic.  Troponins are negative.  Chest CT reviewed no coronary calcium. - She does have bronchiectasis.  I do wonder if this is pneumonia.  Continue antibiotics per primary team. - She can pursue an outpatient coronary CTA.  Nothing needed right now.   Significant Diagnostic Studies:  -Negative troponin.  CT ANGIOGRAPHY CHEST, ABDOMEN AND PELVIS: Vascular:   1. No evidence of thoracoabdominal aortic aneurysm or dissection. 2. No evidence of pulmonary embolus. 3.  Aortic Atherosclerosis (ICD10-I70.0).   Nonvascular:   1. Cardiomegaly. 2. Trace left pleural effusion. 3. Chronic bibasilar scarring and bronchiectasis. 4. No acute intra-abdominal or intrapelvic process.     Electronically Signed   By: Ozell Daring M.D.   On: 12/15/2023 17:32   Discharge Exam: Blood pressure 115/62, pulse 88, temperature 98.9 F (37.2 C), temperature source Oral, resp. rate 20, height 5' 4.5 (1.638 m), weight 67.8 kg, SpO2 100%.   Disposition: Discharge disposition: 01-Home or Self Care       Discharge Instructions     Increase activity slowly   Complete by: As directed    apixaban  (Eliquis ) per pharmacy consult   Complete by: As directed       Allergies as of 12/18/2023       Reactions   Albuterol -budesonide Other (See Comments)   Burned her  tongue albuterol  / budesonide   Amoxicillin -pot Clavulanate    REACTION: nausea   Azithromycin     REACTION: nausea, Gi Other Reaction(s): Unknown   Broccoli [brassica Oleracea]    Celecoxib Other (See Comments)   REACTION: nausea celecoxib   Ciprofloxacin Other (See Comments)   REACTION: nausea ciprofloxacin   Doxycycline  Other (See Comments)   REACTION: gi upset   Erythromycin Other (See Comments)   REACTION: nause, GI   Hydrocodone Other (See Comments)   REACTION: nausea   Hydrocodone-acetaminophen     REACTION: GI upset   Levofloxacin Other (See Comments)   REACTION: she claims renal failiure levofloxacin   Omeprazole  Nausea And Vomiting, Other (See Comments)   omeprazole    Penicillins    Propoxyphene N-acetaminophen     Rofecoxib    REACTION: nausea   Sulfa Antibiotics    Tetanus Toxoid         Medication List     STOP taking these medications    amLODipine  5 MG tablet Commonly known as: NORVASC    famotidine  40 MG tablet Commonly known as: PEPCID    predniSONE  5 MG tablet Commonly known as: DELTASONE        TAKE these medications    acetaminophen  325 MG tablet Commonly known as: TYLENOL  Take 2 tablets (650 mg total) by mouth every 6 (six) hours as needed for mild pain (pain score 1-3) or fever (or Fever >/= 101).  What changed:  medication strength how much to take when to take this reasons to take this   albuterol  108 (90 Base) MCG/ACT inhaler Commonly known as: VENTOLIN  HFA Inhale 1-2 puffs into the lungs every 4 (four) hours as needed for wheezing or shortness of breath.   cefdinir  300 MG capsule Commonly known as: OMNICEF  Take 1 capsule (300 mg total) by mouth 2 (two) times daily for 5 days.   cyclobenzaprine  10 MG tablet Commonly known as: FLEXERIL  Take 1 tablet (10 mg total) by mouth 3 (three) times daily as needed for muscle spasms.   feeding supplement Liqd Take 237 mLs by mouth 2 (two) times daily between meals. Start taking on:  December 19, 2023   fluticasone  50 MCG/ACT nasal spray Commonly known as: FLONASE  Place 2 sprays into both nostrils daily as needed for allergies.   folic acid  1 MG tablet Commonly known as: FOLVITE  Take 2 tablets (2 mg total) by mouth daily.   levothyroxine  100 MCG tablet Commonly known as: SYNTHROID  TAKE 1 TABLET BY MOUTH EVERY DAY   methotrexate  2.5 MG tablet Commonly known as: RHEUMATREX Take 5 tablets (12.5 mg total) by mouth once a week. What changed: how much to take   metoprolol  tartrate 25 MG tablet Commonly known as: LOPRESSOR  Take 1 tablet (25 mg total) by mouth 2 (two) times daily.   nitroGLYCERIN  0.4 MG SL tablet Commonly known as: NITROSTAT  Place 1 tablet (0.4 mg total) under the tongue every 5 (five) minutes as needed for chest pain.   pantoprazole  40 MG tablet Commonly known as: Protonix  Take 1 tablet (40 mg total) by mouth daily.   senna-docusate 8.6-50 MG tablet Commonly known as: Senokot-S Take 1 tablet by mouth at bedtime as needed for mild constipation.       Time spent: 35 Minutes  Signed: Leatrice LILLETTE Chapel 12/18/2023, 5:27 PM

## 2023-12-18 NOTE — Progress Notes (Signed)
 Discharge Medications delivered from TOC meds to bed [facility] outpatient pharmacy by this RN.

## 2023-12-20 LAB — CULTURE, BLOOD (SINGLE)
Culture: NO GROWTH
Special Requests: ADEQUATE

## 2023-12-21 ENCOUNTER — Ambulatory Visit (INDEPENDENT_AMBULATORY_CARE_PROVIDER_SITE_OTHER): Admitting: Family Medicine

## 2023-12-21 ENCOUNTER — Other Ambulatory Visit (HOSPITAL_COMMUNITY): Payer: Self-pay

## 2023-12-21 ENCOUNTER — Telehealth: Payer: Self-pay | Admitting: Cardiovascular Disease

## 2023-12-21 ENCOUNTER — Encounter: Payer: Self-pay | Admitting: Family Medicine

## 2023-12-21 ENCOUNTER — Telehealth: Payer: Self-pay

## 2023-12-21 VITALS — BP 112/60 | HR 54 | Temp 98.2°F | Wt 149.2 lb

## 2023-12-21 DIAGNOSIS — I4891 Unspecified atrial fibrillation: Secondary | ICD-10-CM | POA: Diagnosis not present

## 2023-12-21 DIAGNOSIS — D72829 Elevated white blood cell count, unspecified: Secondary | ICD-10-CM

## 2023-12-21 DIAGNOSIS — I10 Essential (primary) hypertension: Secondary | ICD-10-CM

## 2023-12-21 DIAGNOSIS — K219 Gastro-esophageal reflux disease without esophagitis: Secondary | ICD-10-CM

## 2023-12-21 DIAGNOSIS — R6510 Systemic inflammatory response syndrome (SIRS) of non-infectious origin without acute organ dysfunction: Secondary | ICD-10-CM

## 2023-12-21 NOTE — Transitions of Care (Post Inpatient/ED Visit) (Signed)
 12/21/2023  Name: Christy Moore MRN: [id number] DOB: 1945-04-30  Today's TOC FU Call Status: Today's TOC FU Call Status:: Successful TOC FU Call Completed TOC FU Call Complete Date: 12/21/23  Patient's Name and Date of Birth confirmed. Name, DOB  Transition Care Management Follow-up Telephone Call Date of Discharge: 12/18/23 Discharge Facility: [name]) Type of Discharge: Inpatient Admission Primary Inpatient Discharge Diagnosis:: SIRS How have you been since you were released from the hospital?: Better (Tired) Any questions or concerns?: No  Items Reviewed: Did you receive and understand the discharge instructions provided?: Yes Medications obtained,verified, and reconciled?: Yes (Medications Reviewed) Any new allergies since your discharge?: No Dietary orders reviewed?: Yes Type of Diet Ordered:: Carnation instant breakfast 1 per day Do you have support at home?: Yes People in Home [RPT]: spouse  Medications Reviewed Today: Medications Reviewed Today     Reviewed by Rumalda Alan PENNER, RN (Registered Nurse) on 12/21/23 at [phone]  Med List Status: <None>   Medication Order Taking? Sig Documenting Provider Last Dose Status Informant  acetaminophen  (TYLENOL ) 325 MG tablet [id number] Yes Take 2 tablets (650 mg total) by mouth every 6 (six) hours as needed for mild pain (pain score 1-3) or fever (or Fever >/= 101). Rosario Leatrice FERNS, MD  Active   albuterol  (VENTOLIN  HFA) 108 [phone] Base) MCG/ACT inhaler [id number] Yes Inhale 1-2 puffs into the lungs every 4 (four) hours as needed for wheezing or shortness of breath. [provider]  Active Self  cefdinir  (OMNICEF ) 300 MG capsule [id number] Yes Take 1 capsule (300 mg total) by mouth 2 (two) times daily for 5 days. Rosario Leatrice FERNS, MD  Active   cyclobenzaprine  (FLEXERIL ) 10 MG tablet [id number] Yes Take 1 tablet (10 mg total) by mouth 3 (three) times daily as needed for muscle spasms. Johnny Garnette LABOR, MD  Active Self   feeding supplement (ENSURE PLUS HIGH PROTEIN) LIQD [id number]  Take 237 mLs by mouth 2 (two) times daily between meals.  Patient not taking: Reported on 12/21/2023   Rosario Leatrice I, MD  Active   fluticasone  (FLONASE ) 50 MCG/ACT nasal spray [id number] Yes Place 2 sprays into both nostrils daily as needed for allergies. [provider]  Active Self  folic acid  (FOLVITE ) 1 MG tablet [id number] Yes Take 2 tablets (2 mg total) by mouth daily. Dolphus Reiter, MD  Active Self  levothyroxine  (SYNTHROID ) 100 MCG tablet [id number] Yes TAKE 1 TABLET BY MOUTH EVERY DAY Burchette, Wolm LELON, MD  Active Self  methotrexate  (RHEUMATREX) 2.5 MG tablet [id number] Yes Take 5 tablets (12.5 mg total) by mouth once a week. Cheryl Waddell HERO, PA-C  Active Self  metoprolol  tartrate (LOPRESSOR ) 25 MG tablet [id number] Yes Take 1 tablet (25 mg total) by mouth 2 (two) times daily. Rosario Leatrice FERNS, MD  Active   nitroGLYCERIN  (NITROSTAT ) 0.4 MG SL tablet [id number]  Place 1 tablet (0.4 mg total) under the tongue every 5 (five) minutes as needed for chest pain.  Patient not taking: Reported on 12/21/2023   Rosario Leatrice I, MD  Active   pantoprazole  (PROTONIX ) 40 MG tablet [id number] Yes Take 1 tablet (40 mg total) by mouth daily. Rosario Leatrice FERNS, MD  Active   senna-docusate (SENOKOT-S) 8.6-50 MG tablet [id number]  Take 1 tablet by mouth at bedtime as needed for mild constipation.  Patient not taking: Reported on 12/21/2023   Rosario Leatrice FERNS, MD  Active             Home Care and Equipment/Supplies:  Were Home Health Services Ordered?: No Any new equipment or medical supplies ordered?: No  Functional Questionnaire: Do you need assistance with bathing/showering or dressing?: No Do you need assistance with meal preparation?: No Do you need assistance with eating?: No Do you have difficulty maintaining continence: No Do you need assistance with getting out of bed/getting out of a chair/moving?:  No Do you have difficulty managing or taking your medications?: No  Follow up appointments reviewed: PCP Follow-up appointment confirmed?: Yes Date of PCP follow-up appointment?: 12/21/23 Follow-up Provider: PCP Specialist Hospital Follow-up appointment confirmed?: Yes Date of Specialist follow-up appointment?: 01/05/24 Follow-Up Specialty Provider:: Reche Finder Do you need transportation to your follow-up appointment?: No Do you understand care options if your condition(s) worsen?: Yes-patient verbalized understanding   Today's Vitals   12/21/23 0923  PainSc: 0-No pain    SDOH Interventions Today    Flowsheet Row Most Recent Value  SDOH Interventions   Food Insecurity Interventions Intervention Not Indicated  Housing Interventions Intervention Not Indicated  Transportation Interventions Intervention Not Indicated  Utilities Interventions Intervention Not Indicated    Goals Addressed             This Visit's Progress    [facility] Transitions of Care (TOC) Care Plan       Problems:  Recent Hospitalization for treatment of sepsis Decrease in appetite  Goal:  Over the next 30 days, the patient will not experience hospital readmission  Interventions:  Transitions of Care: Doctor Visits  - discussed the importance of doctor visits Reviewed diagnosis. Reviewed all medications Encouraged patient to complete entire course of antibiotics. Reviewed importance of discussing blister area on arm. Reviewed importance of discussing knot to the right hip Reviewed and offered 30 day TOC program and patient has enrolled. Medication list and allergy list updated. This note sent to PCP.  Patient Self Care Activities:  Attend all scheduled provider appointments Call pharmacy for medication refills 3-7 days in advance of running out of medications Call provider office for new concerns or questions  Notify RN Care Manager of TOC call rescheduling needs Participate in Transition of  Care Program/Attend TOC scheduled calls Take medications as prescribed   Stay hydrated  Plan:  Telephone follow up appointment with care management team member scheduled for:  12/28/2023 at 9 am The patient has been provided with contact information for the care management team and has been advised to call with any health related questions or concerns.         Alan Ee, RN, BSN, CEN [employer]- Transition of Care Team.  [facility] [phone]

## 2023-12-21 NOTE — Telephone Encounter (Signed)
 Spoke with patient who is seeing PCP this afternoon.  Advised would call back after visit to see if she needed to come in sooner than 12/30

## 2023-12-21 NOTE — Telephone Encounter (Signed)
 Spoke with patient regarding visit with Dr Micheal, no changes and CBC done today Dr Micheal didn't specifically say needed sooner follow up   Was discussion about starting Eliquis  in the hospital as well   Will forward to Dr Raford and Reche ORN NP for review on Eliquis /need for sooner appointment

## 2023-12-21 NOTE — Progress Notes (Unsigned)
 Established Patient Office Visit  Subjective   Patient ID: Christy Moore, female    DOB: 1945-07-30  Age: 78 y.o. MRN: [id number]  Chief Complaint  Patient presents with   Hospitalization Follow-up    HPI  {History (Optional):23778} Christy Moore is seen for recent hospital follow-up.  She has history of Sjogren syndrome, Raynaud's, MGUS, history of esophageal stricture.  Presented to ER on 12-9 with chest pain.  Denied any shortness of breath, fever, or chills.  Pain radiated to the back.  She was sedentary with watching TV when this started.  White count 10.8 thousand.  Chest x-ray showed asymmetric patchy opacities left lung base with small left pleural effusion suspected.  CT angiogram chest revealed no aneurysm and no evidence for pulmonary embolism.  No acute intra-abdominal or intrapelvic process noted.  There was comment of some chronic bibasilar scarring and bronchiectasis.  She apparently did develop some fevers in the hospital.  Was diagnosed with systemic inflammatory response syndrome.  Her blood and urine cultures were negative.  She was discharged on empiric coverage with Omnicef .  She had transthoracic echo which showed ejection fraction 50 to 55%.  Mild to moderate tricuspid valve regurgitation.  She states that Thursday morning she woke up and folic she was gasping for air.  Her heart rate transiently went up she states about 160-170.  This apparently was related to A-fib episode that occurred overnight on 12-16-2023.  She was given metoprolol  and converted back to sinus rhythm but did have a long pause.  Discharged on metoprolol  25 mg twice daily.  She was not started on Eliquis  because there been anticipation of possible EGD.  EGD was deferred by gastroenterologist for possible outpatient procedure within the next couple weeks.  She had increased leukocytosis from white count of 10,000 on the 9th and 10th to 21,000 on the 12th.  Etiology of her leukocytosis was not entirely  clear.  Patient did have recent outpatient heart monitor which showed no evidence for A-fib.  She had with very transient runs of ventricular tachycardia and supraventricular tachycardia.  She denies any fever since discharge.  Chest pain improved at this time.  No pain with swallowing.  Tolerating metoprolol  without any major side effects.  She was taken off amlodipine  and blood pressures have been stable  Past Medical History:  Diagnosis Date   Arthritis    Cardiomegaly    Chronic pneumonia    Depression    GERD (gastroesophageal reflux disease)    Hypertension    Hypothyroidism    Raynaud disease    Seizures (HCC)    Sinus tachycardia    Sjogren's syndrome    Status post dilation of esophageal narrowing 2012   Dr. Luis   Past Surgical History:  Procedure Laterality Date   BASAL CELL CARCINOMA EXCISION  2024   CATARACT EXTRACTION, BILATERAL  2024   HAND TENDON SURGERY  02/09/2007   tendon transfer-Dr. Camella   NASAL SEPTUM SURGERY     operated on by Dr. Floy 20 years ago   OVARIAN CYST REMOVAL     Right Sided Salpingo-oopherectomy   SALPINGOOPHORECTOMY     WISDOM TOOTH EXTRACTION      reports that she has never smoked. She has been exposed to tobacco smoke. She has never used smokeless tobacco. She reports that she does not currently use alcohol. She reports that she does not use drugs. family history includes Alcohol abuse in her maternal grandfather and maternal grandmother; Allergies in an other family  member; Cancer in her father and mother; Heart attack (age of onset: 53) in her father; Hypothyroidism in her mother; Stroke (age of onset: 39) in her mother. Allergies[1]' Review of Systems  Constitutional:  Positive for malaise/fatigue. Negative for chills and fever.  HENT:  Negative for sore throat.   Respiratory:  Negative for cough and shortness of breath.   Cardiovascular:  Negative for leg swelling.       See HPI  Gastrointestinal:  Negative for abdominal  pain.  Genitourinary:  Negative for dysuria.      Objective:     BP 112/60   Pulse (!) 54   Temp 98.2 F (36.8 C) (Oral)   Wt 149 lb 3.2 oz (67.7 kg)   SpO2 96%   BMI 25.21 kg/m  BP Readings from Last 3 Encounters:  12/21/23 112/60  12/18/23 115/62  12/01/23 124/70   Wt Readings from Last 3 Encounters:  12/21/23 149 lb 3.2 oz (67.7 kg)  12/15/23 149 lb 7.6 oz (67.8 kg)  12/01/23 148 lb (67.1 kg)      Physical Exam Vitals reviewed.  Constitutional:      General: She is not in acute distress.    Appearance: She is not ill-appearing.  HENT:     Right Ear: Tympanic membrane normal.     Left Ear: Tympanic membrane normal.  Cardiovascular:     Rate and Rhythm: Normal rate and regular rhythm.  Pulmonary:     Effort: Pulmonary effort is normal.     Breath sounds: Normal breath sounds. No wheezing or rales.  Musculoskeletal:     Cervical back: Neck supple.  Neurological:     General: No focal deficit present.     Mental Status: She is alert.      No results found for any visits on 12/21/23.  Last CBC Lab Results  Component Value Date   WBC 10.5 12/18/2023   HGB 11.7 (L) 12/18/2023   HCT 35.4 (L) 12/18/2023   MCV 94.7 12/18/2023   MCH 31.3 12/18/2023   RDW 14.3 12/18/2023   PLT 172 12/18/2023   Last metabolic panel Lab Results  Component Value Date   GLUCOSE 104 (H) 12/18/2023   NA 139 12/18/2023   K 3.6 12/18/2023   CL 104 12/18/2023   CO2 26 12/18/2023   BUN 14 12/18/2023   CREATININE 0.81 12/18/2023   GFRNONAA >60 12/18/2023   CALCIUM 8.5 (L) 12/18/2023   PHOS 3.6 12/17/2023   PROT 7.1 11/25/2023   ALBUMIN 3.9 11/25/2023   LABGLOB 3.2 11/25/2023   BILITOT 0.6 11/25/2023   ALKPHOS 75 11/25/2023   AST 20 11/25/2023   ALT 6 11/25/2023   ANIONGAP 9 12/18/2023   Last lipids Lab Results  Component Value Date   CHOL 163 02/14/2015   HDL 52.70 02/14/2015   LDLCALC 89 02/14/2015   TRIG 106.0 02/14/2015   CHOLHDL 3 02/14/2015   Last  hemoglobin A1c Lab Results  Component Value Date   HGBA1C 6.0 (H) 07/16/2023   Last thyroid  functions Lab Results  Component Value Date   TSH 0.687 11/25/2023      The ASCVD Risk score (Arnett DK, et al., 2019) failed to calculate for the following reasons:   Cannot find a previous HDL lab   Cannot find a previous total cholesterol lab   * - Cholesterol units were assumed    Assessment & Plan:   #1 recent fever, leukocytosis, SIRS.  Etiology of fever and leukocytosis unclear.  No evidence for  pneumonia.  Urine cultures and blood culture have remained negative.  Patient currently on Omnicef  with no recurrent fever.  -Monitor closely for any return of fever and be in touch for any recurrent fevers or chills - Recheck CBC with differential today  #2 transient atrial fibrillation.  Patient appears to be in sinus rhythm today.  Eliquis  was not started because of anticipated EGD.  On metoprolol  tartrate 25 mg twice daily  #3 recent atypical chest pain felt to be noncardiac.  GI follow-up pending.  #4 history of hypertension.  Currently stable off amlodipine .  She is on metoprolol  tartrate 25 mg twice daily.   No follow-ups on file.    Wolm Scarlet, MD     [1]  Allergies Allergen Reactions   Albuterol -Budesonide Other (See Comments)    Burned her tongue  albuterol  / budesonide   Amoxicillin -Pot Clavulanate     REACTION: nausea   Azithromycin      REACTION: nausea, Gi  Other Reaction(s): Unknown   Broccoli [Brassica Oleracea]     RAW only    Celecoxib Other (See Comments)    REACTION: nausea  celecoxib   Ciprofloxacin Other (See Comments)    REACTION: nausea  ciprofloxacin   Doxycycline  Other (See Comments)    REACTION: gi upset   Erythromycin Other (See Comments)    REACTION: nause, GI   Hydrocodone Other (See Comments)    REACTION: nausea   Hydrocodone-Acetaminophen      REACTION: GI upset   Levofloxacin Other (See Comments)    REACTION: she claims  renal failiure  levofloxacin   Omeprazole  Nausea And Vomiting and Other (See Comments)    omeprazole    Penicillins    Propoxyphene N-Acetaminophen     Rofecoxib     REACTION: nausea   Sulfa Antibiotics    Tetanus Toxoid

## 2023-12-21 NOTE — Patient Instructions (Signed)
Follow up immediately for any fever or increased shortness of breath. 

## 2023-12-21 NOTE — Telephone Encounter (Signed)
 Pt scheduled for 12/30 but was in hospital and they would like her to follow up sooner. Pease advise.

## 2023-12-21 NOTE — Telephone Encounter (Signed)
 Chart reviewed. Admitted 12/15/23 with SIRS. Developed afib treated with metoprolol , had 17 second pos conversion pause, discharged on Metoprolol  Tartrate 25mg  BID.Echo normal LVEF 50-55%, elevated PASP, normal wall motion. CT chest on PE. Chest pain felt to be GI related. Plan per GI to complete workup and possible EGD outpatient.   Sees GI 01/27/24. No plans for emergent EGD.  Per Dr. Barbaraann consult note 12/18/23 in hospital 'if no plans for EG consider Eliquis  5mg  BID' and 'f/u in 2-4 weeks'  CBC 12/18/23 (day of discharge) Hb 11.7. Repeat CBC collected today at PCP.  Reasonable to follow up 01/05/24 as this is within 2-4 week recommendation from inpatient stay. Low suspicion sooner visit available. As no plans for EGD, if Hb on labs 12/21/23 >11 reasonable to start Eliquis  5mg  BID.   Christy Moore Christy Shimmin, NP

## 2023-12-22 ENCOUNTER — Ambulatory Visit: Payer: Self-pay | Admitting: Family Medicine

## 2023-12-22 LAB — CBC WITH DIFFERENTIAL/PLATELET
Basophils Absolute: 0.1 K/uL (ref 0.0–0.1)
Basophils Relative: 1.2 % (ref 0.0–3.0)
Eosinophils Absolute: 0.3 K/uL (ref 0.0–0.7)
Eosinophils Relative: 3.3 % (ref 0.0–5.0)
HCT: 38.4 % (ref 36.0–46.0)
Hemoglobin: 12.7 g/dL (ref 12.0–15.0)
Lymphocytes Relative: 8.6 % — ABNORMAL LOW (ref 12.0–46.0)
Lymphs Abs: 0.7 K/uL (ref 0.7–4.0)
MCHC: 33.2 g/dL (ref 30.0–36.0)
MCV: 94.2 fl (ref 78.0–100.0)
Monocytes Absolute: 0.6 K/uL (ref 0.1–1.0)
Monocytes Relative: 7.1 % (ref 3.0–12.0)
Neutro Abs: 6.4 K/uL (ref 1.4–7.7)
Neutrophils Relative %: 79.8 % — ABNORMAL HIGH (ref 43.0–77.0)
Platelets: 305 K/uL (ref 150.0–400.0)
RBC: 4.08 Mil/uL (ref 3.87–5.11)
RDW: 14.5 % (ref 11.5–15.5)
WBC: 8 K/uL (ref 4.0–10.5)

## 2023-12-22 MED ORDER — APIXABAN 5 MG PO TABS: 5.0000 mg | ORAL_TABLET | Freq: Two times a day (BID) | ORAL | 5 refills | Status: AC

## 2023-12-22 NOTE — Telephone Encounter (Signed)
 CBC has resulted.  Reviewed with Reche Finder, NP that hemoglobin was 12.7.  okay to stat Eliquis  5 mg twice a day.  Prescription sent.  Educated on avoiding NSAIDS, notifiy any other providers if having any procedures of being on blood thinner.   Call if notices any unusual bleeding.

## 2023-12-22 NOTE — Progress Notes (Signed)
 Noted.  Christy Covey MD [facility]

## 2023-12-25 ENCOUNTER — Other Ambulatory Visit (HOSPITAL_BASED_OUTPATIENT_CLINIC_OR_DEPARTMENT_OTHER)

## 2023-12-27 DIAGNOSIS — R000 Tachycardia, unspecified: Secondary | ICD-10-CM

## 2023-12-28 ENCOUNTER — Telehealth: Payer: Self-pay

## 2023-12-28 ENCOUNTER — Telehealth: Payer: Self-pay | Admitting: Cardiovascular Disease

## 2023-12-28 ENCOUNTER — Ambulatory Visit (HOSPITAL_COMMUNITY)
Admission: RE | Admit: 2023-12-28 | Discharge: 2023-12-28 | Disposition: A | Discharge status: Home / Self Care | Source: Ambulatory Visit | Attending: Internal Medicine | Admitting: Internal Medicine

## 2023-12-28 ENCOUNTER — Encounter (HOSPITAL_COMMUNITY): Payer: Self-pay | Admitting: Internal Medicine

## 2023-12-28 VITALS — BP 118/72 | HR 95 | Ht 64.5 in | Wt 145.4 lb

## 2023-12-28 DIAGNOSIS — I480 Paroxysmal atrial fibrillation: Secondary | ICD-10-CM | POA: Insufficient documentation

## 2023-12-28 DIAGNOSIS — R000 Tachycardia, unspecified: Secondary | ICD-10-CM | POA: Diagnosis not present

## 2023-12-28 DIAGNOSIS — D6869 Other thrombophilia: Secondary | ICD-10-CM | POA: Diagnosis not present

## 2023-12-28 DIAGNOSIS — I4892 Unspecified atrial flutter: Secondary | ICD-10-CM | POA: Diagnosis not present

## 2023-12-28 MED ORDER — AMIODARONE HCL 200 MG PO TABS: ORAL_TABLET | ORAL | 2 refills | Status: AC

## 2023-12-28 NOTE — Telephone Encounter (Signed)
 Pt c/o of Chest Pain: STAT if active (IN THIS MOMENT) CP, including tightness, pressure, jaw pain, shoulder/upper arm/back pain, SOB, nausea, and vomiting.  1. Are you having CP right now (tightness, pressure, or discomfort)? Still having discomfort   2. Are you experiencing any other symptoms (ex. SOB, nausea, vomiting, sweating)? Palpitations, elevated HR (174)   3. How long have you been experiencing CP? Few days, pt was in the hospital last week   4. Is your CP continuous or coming and going? Coming and going   5. Have you taken Nitroglycerin ? No   6. If CP returns before callback, please consider calling 911. ?

## 2023-12-28 NOTE — Telephone Encounter (Signed)
 Spoke with patient and scheduled her visit for today with Afib clinic  Patient aware of date, time and location

## 2023-12-28 NOTE — Telephone Encounter (Signed)
 Likely recurrent atrial fib. Recommend she take an additional tablet of Metoprolol  if heart rate still >110bpm and as long as SBP >110.  Likely needs OV. AFib clinic may have availability this afternoon?  Jatavis Malek S Manraj Yeo, NP

## 2023-12-28 NOTE — Patient Instructions (Addendum)
 Start Amiodarone  200mg  twice a day through 01/10/24 then reduce to 200mg  once a day -- take with food

## 2023-12-28 NOTE — Progress Notes (Addendum)
 "   Primary Care Physician: Christy Moore ORN, MD Primary Cardiologist: Christy Scarce, MD Electrophysiologist: None     Referring Physician: Dr. Scarce Marvis ORN Moore is a 78 y.o. female with a history of HTN, aortic atherosclerosis by CT imaging, Sjogren syndrome, acid reflux, esophageal stricture, and paroxysmal atrial flutter who presents for consultation in the [facility].  Hospital admission 12/9 - 12/18/2023 for SIRS complicated by atrial flutter with RVR.  Patient was given metoprolol  and had a postconversion pause of approximately 17 seconds.  It was noted by Dr. Vernice that A-fib is likely being driven by sepsis.  If this occurs again would likely pursue a short course of amiodarone  therapy.  Patient contacted office earlier today noting elevated heart rate.  Patient is on Eliquis  5 mg twice daily for stroke prevention.  On evaluation today, patient is currently in NSR.  Patient noted that her heart rate was in the 170s at approximately 5 AM this morning.  It causes her to feel tired, palpitations, and cause chest discomfort.  She notes the chest discomfort is maybe a little bit less in intensity compared to hospital admission.  She began Eliquis  5 mg twice daily on 12/16.  Today, she denies symptoms of orthopnea, PND, lower extremity edema, dizziness, presyncope, syncope, bleeding, or neurologic sequela. The patient is tolerating medications without difficulties and is otherwise without complaint today.    she has a BMI of Body mass index is 24.57 kg/m.Christy Moore Filed Weights   12/28/23 1339  Weight: 66 kg    Current Outpatient Medications  Medication Sig Dispense Refill   acetaminophen  (TYLENOL ) 325 MG tablet Take 2 tablets (650 mg total) by mouth every 6 (six) hours as needed for mild pain (pain score 1-3) or fever (or Fever >/= 101).     albuterol  (VENTOLIN  HFA) 108 (90 Base) MCG/ACT inhaler Inhale 1-2 puffs into the lungs every 4 (four) hours  as needed for wheezing or shortness of breath.     amiodarone  (PACERONE ) 200 MG tablet Take 1 tablet (200 mg total) by mouth 2 (two) times daily for 14 days, THEN 1 tablet (200 mg total) daily. 60 tablet 2   apixaban  (ELIQUIS ) 5 MG TABS tablet Take 1 tablet (5 mg total) by mouth 2 (two) times daily. 60 tablet 5   cyclobenzaprine  (FLEXERIL ) 10 MG tablet Take 1 tablet (10 mg total) by mouth 3 (three) times daily as needed for muscle spasms. 60 tablet 0   feeding supplement (ENSURE PLUS HIGH PROTEIN) LIQD Take 237 mLs by mouth 2 (two) times daily between meals. 14220 mL 1   fluticasone  (FLONASE ) 50 MCG/ACT nasal spray Place 2 sprays into both nostrils daily as needed for allergies.     folic acid  (FOLVITE ) 1 MG tablet Take 2 tablets (2 mg total) by mouth daily. 180 tablet 3   levothyroxine  (SYNTHROID ) 100 MCG tablet TAKE 1 TABLET BY MOUTH EVERY DAY 90 tablet 1   methotrexate  (RHEUMATREX) 2.5 MG tablet Take 5 tablets (12.5 mg total) by mouth once a week. 60 tablet 0   metoprolol  tartrate (LOPRESSOR ) 25 MG tablet Take 1 tablet (25 mg total) by mouth 2 (two) times daily. 60 tablet 1   nitroGLYCERIN  (NITROSTAT ) 0.4 MG SL tablet Place 1 tablet (0.4 mg total) under the tongue every 5 (five) minutes as needed for chest pain. 20 tablet 0   pantoprazole  (PROTONIX ) 40 MG tablet Take 1 tablet (40 mg total) by mouth daily. 30 tablet 0  senna-docusate (SENOKOT-S) 8.6-50 MG tablet Take 1 tablet by mouth at bedtime as needed for mild constipation. 20 tablet 0   No current facility-administered medications for this encounter.    Atrial Fibrillation Management history:  Previous antiarrhythmic drugs: none Previous cardioversions: none Previous ablations: none Anticoagulation history: Eliquis    ROS- All systems are reviewed and negative except as per the HPI above.  Physical Exam: BP 118/72   Pulse 95   Ht 5' 4.5 (1.638 m)   Wt 66 kg   BMI 24.57 kg/m   GEN: Well nourished, well developed in no acute  distress NECK: No JVD; No carotid bruits CARDIAC: Regular rate and rhythm, no murmurs, rubs, gallops RESPIRATORY:  Clear to auscultation without rales, wheezing or rhonchi  ABDOMEN: Soft, non-tender, non-distended EXTREMITIES:  No edema; No deformity   EKG today demonstrates  EKG Interpretation Date/Time:  Monday December 28 2023 13:43:01 EST Ventricular Rate:  95 PR Interval:  152 QRS Duration:  118 QT Interval:  368 QTC Calculation: 462 R Axis:   -68  Text Interpretation: Normal sinus rhythm Possible Left atrial enlargement Right bundle branch block Left anterior fascicular block Bifascicular block Left ventricular hypertrophy with repolarization abnormality ( R in aVL , Romhilt-Estes ) Cannot rule out Septal infarct , age undetermined Abnormal ECG When compared with ECG of 17-Dec-2023 03:43, PREVIOUS ECG IS PRESENT Confirmed by Christy Moore ([id number]) on 12/28/2023 2:09:04 PM    Echo 12/16/23 demonstrated   1. Left ventricular ejection fraction, by estimation, is 50 to 55%. The  left ventricle has low normal function. The left ventricle has no regional  wall motion abnormalities. The global longitudinal strain is normal.   2. Right ventricular systolic function is normal. The right ventricular  size is normal. There is severely elevated pulmonary artery systolic  pressure. The estimated right ventricular systolic pressure is 61.0 mmHg.   3. The mitral valve is normal in structure. Mild mitral valve  regurgitation. No evidence of mitral stenosis.   4. Tricuspid valve regurgitation is mild to moderate.   5. The aortic valve is normal in structure. Aortic valve regurgitation is  not visualized. No aortic stenosis is present.   6. The inferior vena cava is normal in size with greater than 50%  respiratory variability, suggesting right atrial pressure of 3 mmHg.   ASSESSMENT & PLAN CHA2DS2-VASc Score = 5  The patient's score is based upon: CHF History: 0 HTN History: 1 Diabetes  History: 0 Stroke History: 0 Vascular Disease History: 1 Age Score: 2 Gender Score: 1       ASSESSMENT AND PLAN: Paroxysmal Atrial Flutter (ICD10:  I48.0) The patient's CHA2DS2-VASc score is 5, indicating a 7.2% annual risk of stroke.    Patient is currently in NSR.  She has had another episode of significant tachyarrhythmia since hospital discharge.  We discussed temporary antiarrhythmic therapy to help patient try to maintain sinus rhythm during convalescence from her hospital admission for sepsis.  We discussed amiodarone  as a temporary antiarrhythmic and went over the possible adverse effects.  After discussion, patient is agreeable to taking medication for short-term.  We will begin amiodarone  200 mg twice daily x 2 weeks then transition to 200 mg once daily.  Will repeat ECG in 2 to 3 weeks.  Plan is to eventually discontinue in approximately ~90 days.  My hope is that patient being on amiodarone  can also help avoid postconversion pauses as those seen in the hospital.  Secondary Hypercoagulable State (ICD10:  [phone]) The patient is  at significant risk for stroke/thromboembolism based upon her CHA2DS2-VASc Score of 5.  Continue Apixaban  (Eliquis ).   She began Eliquis  5 mg BID on 12/16.  Will continue Eliquis  5 mg twice daily for now.  We did discuss that patient appears to have a new arrhythmia secondary to infection.  If she has no arrhythmia burden going forward, can consider eventual discontinuation of anticoagulant.  We discussed the risk versus benefits of anticoagulation and for now we will continue.    Follow up 2 to 3 weeks for repeat ECG.   Christy Moore, [facility]  Afib Clinic [address] [place], [place] [address] [phone]   "

## 2023-12-28 NOTE — Telephone Encounter (Addendum)
 Spoke with patient regarding chest pain  Was in hospital for sepsis 12/9, stated she didn't even know had sepsis just knew an infection  Had a 17 second pause while admitted  Chart reviewed. Admitted 12/15/23 with SIRS. Developed afib treated with metoprolol , had 17 second pos conversion pause, discharged on Metoprolol  Tartrate 25mg  BID.Echo normal LVEF 50-55%, elevated PASP, normal wall motion. CT chest on PE. Chest pain felt to be GI related. Plan per GI to complete workup and possible EGD outpatient.  Woke up this morning around 5 am with chest pain, worse when laying down or on side At times felt like butterfly in chest other times felt like hammer hitting from inside  Currently not having chest pains however returns if up moving around.  Feels sore inside but not sore to touch  Heart rate was 170, 171, and 174. Blood pressure 119/70-90 Last blood pressure reading 119/73 HR 96. Has  taken her Metoprolol  and Eliquis    Has follow up 12/30 but is very concerned about her pains and elevated HR   Will forward to Reche ORN NP and Dr Raford for review

## 2023-12-28 NOTE — Telephone Encounter (Signed)
 Spoke with patient and she let me know that results were already given to her.

## 2023-12-29 ENCOUNTER — Telehealth: Payer: Self-pay

## 2023-12-29 NOTE — Transitions of Care (Post Inpatient/ED Visit) (Signed)
 " Transition of Care week 2  Visit Note  12/29/2023  Name: Christy Moore MRN: [id number]          DOB: 04/09/1945  Situation: Patient enrolled in [facility] 30-day program. Visit completed with patient by telephone.   Background:   Initial Transition Care Management Follow-up Telephone Call Discharge Date and Diagnosis: 12/18/23, SIRS   Past Medical History:  Diagnosis Date   Arthritis    Cardiomegaly    Chronic pneumonia    Depression    GERD (gastroesophageal reflux disease)    Hypertension    Hypothyroidism    Raynaud disease    Seizures (HCC)    Sinus tachycardia    Sjogren's syndrome    Status post dilation of esophageal narrowing 2012   Dr. Luis    Assessment: Feeling some better.   Continues to have SOB with exertion, cough an high heart rate yesterday,.   Patient Reported Symptoms: Cognitive Cognitive Status: Able to follow simple commands, Alert and oriented to person, place, and time, Normal speech and language skills      Neurological Neurological Review of Symptoms: No symptoms reported    HEENT HEENT Symptoms Reported: Not assessed      Cardiovascular Cardiovascular Symptoms Reported: Irregular pulse Does patient have uncontrolled Hypertension?: No Cardiovascular Management Strategies: Medication therapy, Routine screening Cardiovascular Self-Management Outcome: 4 (good) Cardiovascular Comment: seen at the a fib clinic.  Respiratory Respiratory Symptoms Reported: Dry cough, Shortness of breath Other Respiratory Symptoms: shortness of breath with exertin. Continues to have dry cough.  Completed antibiotics. Respiratory Management Strategies: Routine screening, Medication therapy Respiratory Self-Management Outcome: 4 (good)  Endocrine Endocrine Symptoms Reported: No symptoms reported Is patient diabetic?: No Endocrine Self-Management Outcome: 5 (very good)  Gastrointestinal Gastrointestinal Symptoms Reported: No symptoms reported Gastrointestinal  Self-Management Outcome: 5 (very good)    Genitourinary Genitourinary Symptoms Reported: No symptoms reported Genitourinary Self-Management Outcome: 5 (very good)  Integumentary Additional Integumentary Details: reports blister is gone. Skin Management Strategies: Routine screening Skin Self-Management Outcome: 5 (very good)  Musculoskeletal Additional Musculoskeletal Details: reports knot to the right hip is gone. Now with soreness.   no new concerns Musculoskeletal Management Strategies: Routine screening Musculoskeletal Self-Management Outcome: 5 (very good)      Psychosocial Psychosocial Symptoms Reported: Not assessed         Today's Vitals   12/29/23 0904  BP: 129/70  Pulse: 98      Medications Reviewed Today     Reviewed by Rumalda Alan PENNER, RN (Registered Nurse) on 12/29/23 at [phone]  Med List Status: <None>   Medication Order Taking? Sig Documenting Provider Last Dose Status Informant  acetaminophen  (TYLENOL ) 325 MG tablet [id number] Yes Take 2 tablets (650 mg total) by mouth every 6 (six) hours as needed for mild pain (pain score 1-3) or fever (or Fever >/= 101). Rosario Leatrice FERNS, MD  Active   albuterol  (VENTOLIN  HFA) 108 [phone] Base) MCG/ACT inhaler [id number] Yes Inhale 1-2 puffs into the lungs every 4 (four) hours as needed for wheezing or shortness of breath. [provider]  Active Self  amiodarone  (PACERONE ) 200 MG tablet [id number] Yes Take 1 tablet (200 mg total) by mouth 2 (two) times daily for 14 days, THEN 1 tablet (200 mg total) daily. Terra Fairy PARAS, PA-C  Active   apixaban  (ELIQUIS ) 5 MG TABS tablet [id number] Yes Take 1 tablet (5 mg total) by mouth 2 (two) times daily. Vannie Reche RAMAN, NP  Active   cyclobenzaprine  (FLEXERIL ) 10 MG tablet [id number] Yes  Take 1 tablet (10 mg total) by mouth 3 (three) times daily as needed for muscle spasms. Johnny Garnette LABOR, MD  Active Self  feeding supplement (ENSURE PLUS HIGH PROTEIN) LIQD [id number]  Take 237 mLs by mouth  2 (two) times daily between meals.  Patient not taking: Reported on 12/29/2023   Rosario Eland I, MD  Active   fluticasone  (FLONASE ) 50 MCG/ACT nasal spray [id number] Yes Place 2 sprays into both nostrils daily as needed for allergies. [provider]  Active Self  folic acid  (FOLVITE ) 1 MG tablet [id number] Yes Take 2 tablets (2 mg total) by mouth daily. Dolphus Reiter, MD  Active Self  levothyroxine  (SYNTHROID ) 100 MCG tablet [id number] Yes TAKE 1 TABLET BY MOUTH EVERY DAY Burchette, Wolm ORN, MD  Active Self  methotrexate  (RHEUMATREX) 2.5 MG tablet [id number] Yes Take 5 tablets (12.5 mg total) by mouth once a week. Cheryl Waddell HERO, PA-C  Active Self  metoprolol  tartrate (LOPRESSOR ) 25 MG tablet [id number] Yes Take 1 tablet (25 mg total) by mouth 2 (two) times daily. Rosario Eland FERNS, MD  Active   nitroGLYCERIN  (NITROSTAT ) 0.4 MG SL tablet [id number]  Place 1 tablet (0.4 mg total) under the tongue every 5 (five) minutes as needed for chest pain.  Patient not taking: Reported on 12/29/2023   Rosario Eland I, MD  Active   pantoprazole  (PROTONIX ) 40 MG tablet [id number] Yes Take 1 tablet (40 mg total) by mouth daily. Rosario Eland FERNS, MD  Active   senna-docusate (SENOKOT-S) 8.6-50 MG tablet [id number] Yes Take 1 tablet by mouth at bedtime as needed for mild constipation. Rosario Eland FERNS, MD  Active             Goals Addressed             This Visit's Progress    [facility] Transitions of Care (TOC) Care Plan       Problems:  Recent Hospitalization for treatment of sepsis  12/29/2023- still short of breath, dry cough,  no fever,  Went to Afib clinic yesterday.  In NSR.   Started on Amiodarone .  Decrease in appetite 12/29/2023  appetite is still no good.  Feels like she is staying hydrated.    Goal:  Over the next 30 days, the patient will not experience hospital readmission  Interventions:  Transitions of Care: Doctor Visits  - discussed the importance of doctor  visits Reviewed all medications.  New medication- amiodarone .  Confirmed patient complete antibiotics. Reviewed episodes of rapid heart rate ( 174) yesterday.  Encouraged patient to call 911 if this occurs again. Reviewed a fib zones.   Reviewed that patient was seen at that the afib clinic and converted on her own at home. Reviewed importance of staying hydrated and eating well.  Encouraged adequate rest. Reviewed importance of keeping heart rate and BP log and taking to MD office. Encouraged patient to use her inhaler for shortness of breath or severe coughing    Patient Self Care Activities:  Attend all scheduled provider appointments Call pharmacy for medication refills 3-7 days in advance of running out of medications Call provider office for new concerns or questions  Notify RN Care Manager of TOC call rescheduling needs Participate in Transition of Care Program/Attend TOC scheduled calls Take medications as prescribed   Stay hydrated Follow Afib action plan  Rest   Plan:  Telephone follow up appointment with care management team member scheduled for:  01/05/2024 at 2pm The patient has been provided with contact information for  the care management team and has been advised to call with any health related questions or concerns.         Recommendation:   Continue Current Plan of Care  Follow Up Plan:   Telephone follow up appointment date/time:  12//30/2025 at 2pm  Alan Ee, RN, BSN, CEN [facility]- Transition of Care Team.  [facility] [phone]    "

## 2023-12-29 NOTE — Patient Instructions (Signed)
 Visit Information  Thank you for taking time to visit with me today. Please don't hesitate to contact me if I can be of assistance to you before our next scheduled telephone appointment.  Our next appointment is by telephone on 01/05/2024 at 2pm   Following is a copy of your care plan:   Goals Addressed             This Visit's Progress    [facility] Transitions of Care (TOC) Care Plan       Problems:  Recent Hospitalization for treatment of sepsis  12/29/2023- still short of breath, dry cough,  no fever,  Went to Afib clinic yesterday.  In NSR.   Started on Amiodarone .  Decrease in appetite 12/29/2023  appetite is still no good.  Feels like she is staying hydrated.    Goal:  Over the next 30 days, the patient will not experience hospital readmission  Interventions:  Transitions of Care: Doctor Visits  - discussed the importance of doctor visits Reviewed all medications.  New medication- amiodarone .  Confirmed patient complete antibiotics. Reviewed episodes of rapid heart rate ( 174) yesterday.  Encouraged patient to call 911 if this occurs again. Reviewed a fib zones.   Reviewed that patient was seen at that the afib clinic and converted on her own at home. Reviewed importance of staying hydrated and eating well.  Encouraged adequate rest. Reviewed importance of keeping heart rate and BP log and taking to MD office. Encouraged patient to use her inhaler for shortness of breath or severe coughing    Patient Self Care Activities:  Attend all scheduled provider appointments Call pharmacy for medication refills 3-7 days in advance of running out of medications Call provider office for new concerns or questions  Notify RN Care Manager of TOC call rescheduling needs Participate in Transition of Care Program/Attend TOC scheduled calls Take medications as prescribed   Stay hydrated Follow Afib action plan  Rest   Plan:  Telephone follow up appointment with care management team  member scheduled for:  01/05/2024 at 2pm The patient has been provided with contact information for the care management team and has been advised to call with any health related questions or concerns.         Care plan and visit instructions communicated with the patient verbally today. Patient agrees to receive a copy in MyChart. Active MyChart status and patient understanding of how to access instructions and care plan via MyChart confirmed with patient.     Telephone follow up appointment with care management team member scheduled for: 01/05/2024 at 2pm  Please call the care guide team at [phone] if you need to cancel or reschedule your appointment.   Please call the Suicide and Crisis Lifeline: 988 call the [place]  National Suicide Prevention Lifeline: 475-668-5553 or TTY: (740)347-9704 TTY [phone]) to talk to a trained counselor call 911 if you are experiencing a Mental Health or Behavioral Health Crisis or need someone to talk to.  Alan Ee, RN, BSN, CEN [employer]- Transition of Care Team.  [facility] [phone]

## 2024-01-05 ENCOUNTER — Encounter (HOSPITAL_BASED_OUTPATIENT_CLINIC_OR_DEPARTMENT_OTHER): Payer: Self-pay | Admitting: Family

## 2024-01-05 ENCOUNTER — Ambulatory Visit (INDEPENDENT_AMBULATORY_CARE_PROVIDER_SITE_OTHER): Admitting: Family

## 2024-01-05 ENCOUNTER — Other Ambulatory Visit: Payer: Self-pay

## 2024-01-05 VITALS — BP 110/72 | HR 65 | Ht 64.5 in | Wt 146.5 lb

## 2024-01-05 DIAGNOSIS — Z79899 Other long term (current) drug therapy: Secondary | ICD-10-CM

## 2024-01-05 DIAGNOSIS — I480 Paroxysmal atrial fibrillation: Secondary | ICD-10-CM

## 2024-01-05 DIAGNOSIS — D6859 Other primary thrombophilia: Secondary | ICD-10-CM | POA: Diagnosis not present

## 2024-01-05 NOTE — Progress Notes (Signed)
 " Cardiology Office Note   Date:  01/05/2024  ID:  Christy Moore, Christy Moore Nov 17, 1945, MRN [id number] PCP: Micheal Wolm LELON, MD  [facility] Providers Cardiologist:  Annabella Scarce, MD     History of Present Illness Christy Moore is a 78 y.o. female with a hx of Sjorgren's disease, HTN, GERD, Raynaud's, bifascicular heart block, PAF in setting of SIRS.   Prior nuclear stress test 2014 LVEF 60% and no ischemia. Repeat echo 05/2020 LVEF 50-55%, gr1DD. 06/2020 Amlodipine  increased due to elevated BP and Raynaud's  Admitted 12/15/23 with SIRS. Developed afib treated with metoprolol , had 17 second pos conversion pause, discharged on Metoprolol  Tartrate 25mg  BID.Echo normal LVEF 50-55%, elevated PASP, normal wall motion. CT chest on PE. Chest pain felt to be GI related. Plan per GI to complete workup and possible EGD outpatient. She was discharged with Eliquis , it was promptly initiated by cardiology as Hb remained stable.  Contacted the office 12/28/23 noting elevated HR. Seen by AFib clinic same day with heart rate at home as high as 170s but in clinic NSR. She was started on Amiodarone  200mg  BID x 2 weeks then transition to 200mg  daily. Hope was to be able to discontinue in approx 90 days.   Presents today for follow up. Reports no palpitations since starting Amiodarone . Inquires about warfarin vs elqiuis, reviewed lower bleeding risk profile for Eliquis . Per AFib clinic notes hopefully PAF resolves as she recovers from SIRS and may not require long term OAC. Reports no shortness of breath nor dyspnea on exertion. Reports no chest pain, pressure, or tightness. No edema, orthopnea, PND. Reports no palpitations.    ROS: Please see the history of present illness.    All other systems reviewed and are negative.   Studies Reviewed      Cardiac Studies & Procedures   ______________________________________________________________________________________________      ECHOCARDIOGRAM  ECHOCARDIOGRAM COMPLETE 12/16/2023  Narrative ECHOCARDIOGRAM REPORT    Patient Name:   Christy Moore [facility] Date of Exam: 12/16/2023 Medical Rec #:  [id number]       Height:       64.5 in Accession #:    [id number]      Weight:       149.5 lb Date of Birth:  1945-05-27       BSA:          1.738 m Patient Age:    78 years        BP:           114/54 mmHg Patient Gender: F               HR:           94 bpm. Exam Location:  Inpatient  Procedure: 2D Echo, Cardiac Doppler, Color Doppler and Intracardiac Opacification Agent (Both Spectral and Color Flow Doppler were utilized during procedure).  Indications:    R07.9* Chest pain, unspecified  History:        Patient has prior history of Echocardiogram examinations. Cardiomegaly, Signs/Symptoms:Fever; Risk Factors:Hypertension.  Sonographer:    Ellouise Mose RDCS Referring Phys: [id number] JORIE SAUNDERS PATEL   Sonographer Comments: Study delayed for consult. IMPRESSIONS   1. Left ventricular ejection fraction, by estimation, is 50 to 55%. The left ventricle has low normal function. The left ventricle has no regional wall motion abnormalities. The global longitudinal strain is normal. 2. Right ventricular systolic function is normal. The right ventricular size is normal. There is severely elevated pulmonary artery systolic pressure. The  estimated right ventricular systolic pressure is 61.0 mmHg. 3. The mitral valve is normal in structure. Mild mitral valve regurgitation. No evidence of mitral stenosis. 4. Tricuspid valve regurgitation is mild to moderate. 5. The aortic valve is normal in structure. Aortic valve regurgitation is not visualized. No aortic stenosis is present. 6. The inferior vena cava is normal in size with greater than 50% respiratory variability, suggesting right atrial pressure of 3 mmHg.  FINDINGS Left Ventricle: Left ventricular ejection fraction, by estimation, is 50 to 55%. The left ventricle has low normal  function. The left ventricle has no regional wall motion abnormalities. Definity  contrast agent was given IV to delineate the left ventricular endocardial borders. Strain was performed and the global longitudinal strain is normal. The left ventricular internal cavity size was normal in size. There is no left ventricular hypertrophy. Abnormal (paradoxical) septal motion, consistent with left bundle branch block.  Right Ventricle: The right ventricular size is normal. No increase in right ventricular wall thickness. Right ventricular systolic function is normal. There is severely elevated pulmonary artery systolic pressure. The tricuspid regurgitant velocity is 3.39 m/s, and with an assumed right atrial pressure of 15 mmHg, the estimated right ventricular systolic pressure is 61.0 mmHg.  Left Atrium: Left atrial size was normal in size.  Right Atrium: Right atrial size was normal in size.  Pericardium: There is no evidence of pericardial effusion.  Mitral Valve: The mitral valve is normal in structure. Mild mitral valve regurgitation. No evidence of mitral valve stenosis. MV peak gradient, 9.2 mmHg. The mean mitral valve gradient is 5.5 mmHg.  Tricuspid Valve: The tricuspid valve is normal in structure. Tricuspid valve regurgitation is mild to moderate. No evidence of tricuspid stenosis.  Aortic Valve: The aortic valve is normal in structure. Aortic valve regurgitation is not visualized. No aortic stenosis is present. Aortic valve mean gradient measures 10.5 mmHg. Aortic valve peak gradient measures 18.5 mmHg. Aortic valve area, by VTI measures 1.73 cm.  Pulmonic Valve: The pulmonic valve was normal in structure. Pulmonic valve regurgitation is not visualized. No evidence of pulmonic stenosis.  Aorta: The aortic root is normal in size and structure.  Venous: The inferior vena cava is normal in size with greater than 50% respiratory variability, suggesting right atrial pressure of 3  mmHg.  IAS/Shunts: No atrial level shunt detected by color flow Doppler.   LEFT VENTRICLE PLAX 2D LVIDd:         4.80 cm     Diastology LVIDs:         3.40 cm     LV e' medial:    16.10 cm/s LV PW:         1.30 cm     LV E/e' medial:  8.0 LV IVS:        0.80 cm     LV e' lateral:   11.10 cm/s LVOT diam:     2.00 cm     LV E/e' lateral: 11.6 LV SV:         62 LV SV Index:   36 LVOT Area:     3.14 cm  LV Volumes (MOD) LV vol d, MOD A2C: 74.7 ml LV vol d, MOD A4C: 88.5 ml LV vol s, MOD A2C: 34.0 ml LV vol s, MOD A4C: 46.6 ml LV SV MOD A2C:     40.8 ml LV SV MOD A4C:     88.5 ml LV SV MOD BP:      41.3 ml  RIGHT VENTRICLE  IVC RV S prime:     155.85 cm/s  IVC diam: 1.20 cm TAPSE (M-mode): 2.8 cm PULMONARY VEINS Diastolic Velocity: 60.20 cm/s S/D Velocity:       1.40 Systolic Velocity:  83.30 cm/s  LEFT ATRIUM           Index        RIGHT ATRIUM           Index LA diam:      2.40 cm 1.38 cm/m   RA Area:     16.70 cm LA Vol (A2C): 24.7 ml 14.21 ml/m  RA Volume:   38.75 ml  22.30 ml/m LA Vol (A4C): 44.1 ml 25.38 ml/m AORTIC VALVE AV Area (Vmax):    1.86 cm AV Area (Vmean):   1.78 cm AV Area (VTI):     1.73 cm AV Vmax:           215.00 cm/s AV Vmean:          152.500 cm/s AV VTI:            0.360 m AV Peak Grad:      18.5 mmHg AV Mean Grad:      10.5 mmHg LVOT Vmax:         127.50 cm/s LVOT Vmean:        86.600 cm/s LVOT VTI:          0.198 m LVOT/AV VTI ratio: 0.55  AORTA Ao Root diam: 2.90 cm Ao Asc diam:  3.30 cm  MITRAL VALVE                TRICUSPID VALVE MV Area (PHT): 5.02 cm     TR Peak grad:   46.0 mmHg MV Area VTI:   1.95 cm     TR Vmax:        339.00 cm/s MV Peak grad:  9.2 mmHg MV Mean grad:  5.5 mmHg     SHUNTS MV Vmax:       1.52 m/s     Systemic VTI:  0.20 m MV Vmean:      113.0 cm/s   Systemic Diam: 2.00 cm MV Decel Time: 151 msec MR Peak grad: 96.4 mmHg MR Mean grad: 45.0 mmHg MR Vmax:      491.00 cm/s MR Vmean:      306.0 cm/s MV E velocity: 129.00 cm/s MV A velocity: 136.00 cm/s MV E/A ratio:  0.95  Oneil Parchment MD Electronically signed by Oneil Parchment MD Signature Date/Time: 12/16/2023/12:44:02 PM    Final    MONITORS  LONG TERM MONITOR (3-14 DAYS) 12/15/2023  Narrative 7 Day Zio Monitor  Quality: Fair.  Baseline artifact. Predominant rhythm: sinus rhythm Average heart rate: 93 bpm Max heart rate: 133 bpm Min heart rate: 64 bpm Pauses >2.5 seconds: none  Occasional (1.4%) PACs. Rare (<1%) PVCs.  4 episodes of NSVT.  Longest episode 7 beats. 2 episodes of SVT.  Longest episode 8 bpm.  Tiffany C. Raford, MD, [facility] 12/27/2023 1:07 PM       ______________________________________________________________________________________________      Risk Assessment/Calculations  CHA2DS2-VASc Score = 5   This indicates a 7.2% annual risk of stroke. The patient's score is based upon: CHF History: 0 HTN History: 1 Diabetes History: 0 Stroke History: 0 Vascular Disease History: 1 Age Score: 2 Gender Score: 1        STOP-Bang Score:  4      Physical Exam VS:  BP 110/72   Pulse 65  Ht 5' 4.5 (1.638 m)   Wt 146 lb 8 oz (66.5 kg)   SpO2 97%   BMI 24.76 kg/m        Wt Readings from Last 3 Encounters:  01/05/24 146 lb 8 oz (66.5 kg)  12/28/23 145 lb 6.4 oz (66 kg)  12/21/23 149 lb 3.2 oz (67.7 kg)    GEN: Well nourished, well developed in no acute distress NECK: No JVD; No carotid bruits CARDIAC: RRR, no murmurs, rubs, gallops RESPIRATORY:  Clear to auscultation without rales, wheezing or rhonchi  ABDOMEN: Soft, non-tender, non-distended EXTREMITIES:  No edema; No deformity   ASSESSMENT AND PLAN  PAF / Hypercoagulable state - in setting of SIRS, had 17 second post conversion pause. Seen by AFib clinic, Amiodarone  initiated and Lopressor  25mg  BID continued. Since starting Amiodarone , no recurrent palpitations and feels much improved. Continue Eliquis  5mg  BID. She  inquires about transition to warfarin as her husband takes this. Reviewed the favorable risk profile of Eliquis . The hope is her PAF is limited to setting of SIRS and long term OAC will not be required, she is agreeable to continue Eliquis  5mg  BID. Amiodarone  monitoring: thyroid  panel, CMP early February for monitoring on Amiodarone . Of note levothyroxine  dosing managed by PCP.   HTN - BP well controlled. Continue current antihypertensive regimen lopressor  25mg  BID. Discussed to monitor BP at home at least 2 hours after medications and sitting for 5-10 minutes.   Bifasicular Heart Block - longstanding history. Cautious use of AV nodal blocking agents. Monitor with periodic EKG. Reports no lightheadedness, dizziness.        Dispo: follow up with AFib Clinic for EKG as scheduled and with Dr. Raford or APP in 3-4 months  Signed, Reche GORMAN Finder, NP   "

## 2024-01-05 NOTE — Patient Instructions (Signed)
 Visit Information  Thank you for taking time to visit with me today. Please don't hesitate to contact me if I can be of assistance to you before our next scheduled telephone appointment.  Following are the goals we discussed today:   Goals Addressed             This Visit's Progress    [facility] Transitions of Care (TOC) Care Plan       Problems:  Recent Hospitalization for treatment of sepsis  12/29/2023- still short of breath, dry cough,  no fever,  Went to Afib clinic yesterday.  In NSR.   Started on Amiodarone . 01/05/2024.  Saw cardiologist today and in NSR.  No changes in medications today.  Reports vitals good.  Pending LFT recheck Decrease in appetite 12/29/2023  appetite is still no good.  Feels like she is staying hydrated. 01/05/2024 reports stomach upset due to protonix ,  has stopped as of today.   Feels like punched in stomach and feels bruised inside.  Denies NVD. Bowels moving well. ( 1-2 times per day)  Goal:  Over the next 30 days, the patient will not experience hospital readmission  Interventions:  Transitions of Care: Doctor Visits  - discussed the importance of doctor visits Reviewed all medications.  New medication- amiodarone . .   Reviewed importance of staying hydrated and eating well.  Encouraged adequate rest. Reviewed importance of keeping heart rate and BP log and taking to MD office. Reviewed pending follow up appointments.  Reviewed with patient that it is not suggested to take her normal Pepcid  with amiodarone .   Patient Self Care Activities:  Attend all scheduled provider appointments Call pharmacy for medication refills 3-7 days in advance of running out of medications Call provider office for new concerns or questions  Notify RN Care Manager of TOC call rescheduling needs Participate in Transition of Care Program/Attend TOC scheduled calls Take medications as prescribed   Stay hydrated Follow Afib action plan  Rest  Call your GI MD if needed for  medications for reflux.   Plan:  Telephone follow up appointment with care management team member scheduled for:  01/12/2024 at 11 am The patient has been provided with contact information for the care management team and has been advised to call with any health related questions or concerns.          Our next appointment is by telephone on 01/12/2024 at 11am  Please call the care guide team at [phone] if you need to cancel or reschedule your appointment.   If you are experiencing a Mental Health or Behavioral Health Crisis or need someone to talk to, please call the Suicide and Crisis Lifeline: 988 call the [place]  National Suicide Prevention Lifeline: 260-682-5681 or TTY: (513)372-8472 TTY 506-001-3841) to talk to a trained counselor call 1-800-273-TALK (toll free, 24 hour hotline) call 911   Care plan and visit instructions communicated with the patient verbally today. Patient agrees to receive a copy in MyChart. Active MyChart status and patient understanding of how to access instructions and care plan via MyChart confirmed with patient.    Alan Ee, RN, BSN, CEN [employer]- Transition of Care Team.  [facility] [phone]

## 2024-01-05 NOTE — Patient Instructions (Addendum)
 Medication Instructions:  Continue your current medications  *If you need a refill on your cardiac medications before your next appointment, please call your pharmacy*  Labs: Your physician recommends that you return for lab work first week of February for liver enzymes and thyroid  panel  Follow-Up: At [facility], you and your health needs are our priority.  As part of our continuing mission to provide you with exceptional heart care, our providers are all part of one team.  This team includes your primary Cardiologist (physician) and Advanced Practice Providers or APPs (Physician Assistants and Nurse Practitioners) who all work together to provide you with the care you need, when you need it.  Your next appointment:   As scheduled with AFib Clinic AND In 3-4 months with Dr. Raford or APP  We recommend signing up for the patient portal called MyChart.  Sign up information is provided on this After Visit Summary.  MyChart is used to connect with patients for Virtual Visits (Telemedicine).  Patients are able to view lab/test results, encounter notes, upcoming appointments, etc.  Non-urgent messages can be sent to your provider as well.   To learn more about what you can do with MyChart, go to [email or web address].   Other Instructions  We will plan to recheck your liver and thyroid  about 6 weeks after starting   To prevent palpitations: Make sure you are adequately hydrated.  Avoid and/or limit caffeine containing beverages like soda or tea. Exercise regularly.  Manage stress well. Some over the counter medications can cause palpitations such as Benadryl, AdvilPM, TylenolPM. Regular Advil or Tylenol  do not cause palpitations.

## 2024-01-05 NOTE — Transitions of Care (Post Inpatient/ED Visit) (Signed)
 " Transition of Care week 3  Visit Note  01/05/2024  Name: Christy Moore MRN: [id number]          DOB: 1945/06/18  Situation: Patient enrolled in [facility] 30-day program. Visit completed with patient by telephone.   Background:   Initial Transition Care Management Follow-up Telephone Call Discharge Date and Diagnosis: 12/18/23, SIRS   Past Medical History:  Diagnosis Date   Arthritis    Cardiomegaly    Chronic pneumonia    Depression    GERD (gastroesophageal reflux disease)    Hypertension    Hypothyroidism    Raynaud disease    Seizures (HCC)    Sinus tachycardia    Sjogren's syndrome    Status post dilation of esophageal narrowing 2012   Dr. Luis    Assessment: Patient reports that she is doing very well. No problems with a fib in the last week. Reports abdominal pain and this is thought to be related to Protonix .  This medication discontinued today.   Patient Reported Symptoms: Cognitive Cognitive Status: Able to follow simple commands, Alert and oriented to person, place, and time, Normal speech and language skills      Neurological Neurological Review of Symptoms: No symptoms reported Neurological Management Strategies: Routine screening  HEENT HEENT Symptoms Reported: Other: HEENT Comment: decreased sinus congestion.    Cardiovascular Cardiovascular Symptoms Reported: No symptoms reported Does patient have uncontrolled Hypertension?: No Cardiovascular Management Strategies: Routine screening, Medication therapy Weight: 142 lb 9.6 oz (64.7 kg) Cardiovascular Comment: reports no afib symptoms in the last week.. Reports heart rate  range. 75-100.   saw cardiology today.  Respiratory Respiratory Symptoms Reported: No symptoms reported Respiratory Management Strategies: Routine screening  Endocrine Endocrine Symptoms Reported: No symptoms reported Endocrine Self-Management Outcome: 5 (very good)  Gastrointestinal Gastrointestinal Symptoms Reported: Abdominal pain or  discomfort Additional Gastrointestinal Details: abdominal pain  ( like a punching bag)  thoughts that this is related protonix .  Normal BM's Gastrointestinal Comment: Reviewed containdicated to take Pepcid  with amiodarone .    Genitourinary Genitourinary Symptoms Reported: No symptoms reported Genitourinary Self-Management Outcome: 5 (very good)  Integumentary Integumentary Symptoms Reported: No symptoms reported Additional Integumentary Details: blister is gone Skin Self-Management Outcome: 5 (very good)  Musculoskeletal Musculoskelatal Symptoms Reviewed: No symptoms reported Additional Musculoskeletal Details: denies any joint pain. Increased walking at home        Psychosocial Psychosocial Symptoms Reported: No symptoms reported         Today's Vitals   01/05/24 1417  Weight: 142 lb 9.6 oz (64.7 kg)   Pain Scale: 0-10  Medications Reviewed Today     Reviewed by Rumalda Alan PENNER, RN (Registered Nurse) on 01/05/24 at 1405  Med List Status: <None>   Medication Order Taking? Sig Documenting Provider Last Dose Status Informant  acetaminophen  (TYLENOL ) 325 MG tablet [id number] Yes Take 2 tablets (650 mg total) by mouth every 6 (six) hours as needed for mild pain (pain score 1-3) or fever (or Fever >/= 101). Rosario Leatrice FERNS, MD  Active   albuterol  (VENTOLIN  HFA) 108 [phone] Base) MCG/ACT inhaler [id number] Yes Inhale 1-2 puffs into the lungs every 4 (four) hours as needed for wheezing or shortness of breath. [provider]  Active Self  amiodarone  (PACERONE ) 200 MG tablet [id number] Yes Take 1 tablet (200 mg total) by mouth 2 (two) times daily for 14 days, THEN 1 tablet (200 mg total) daily. Terra Fairy PARAS, PA-C  Active   apixaban  (ELIQUIS ) 5 MG TABS tablet [id number] Yes  Take 1 tablet (5 mg total) by mouth 2 (two) times daily. Vannie Reche RAMAN, NP  Active   cyclobenzaprine  (FLEXERIL ) 10 MG tablet [id number] Yes Take 1 tablet (10 mg total) by mouth 3 (three) times daily as needed  for muscle spasms. Johnny Garnette LABOR, MD  Active Self   Patient not taking:   Discontinued 01/05/24 1034 (Patient Preference)   fluticasone  (FLONASE ) 50 MCG/ACT nasal spray [id number] Yes Place 2 sprays into both nostrils daily as needed for allergies. [provider]  Active Self  folic acid  (FOLVITE ) 1 MG tablet [id number] Yes Take 2 tablets (2 mg total) by mouth daily. Dolphus Reiter, MD  Active Self  levothyroxine  (SYNTHROID ) 100 MCG tablet [id number] Yes TAKE 1 TABLET BY MOUTH EVERY DAY Burchette, Wolm ORN, MD  Active Self  methotrexate  (RHEUMATREX) 2.5 MG tablet [id number] Yes Take 5 tablets (12.5 mg total) by mouth once a week. Cheryl Waddell HERO, PA-C  Active Self  metoprolol  tartrate (LOPRESSOR ) 25 MG tablet [id number] Yes Take 1 tablet (25 mg total) by mouth 2 (two) times daily. Rosario Leatrice FERNS, MD  Active   nitroGLYCERIN  (NITROSTAT ) 0.4 MG SL tablet [id number] Yes Place 1 tablet (0.4 mg total) under the tongue every 5 (five) minutes as needed for chest pain. Rosario Leatrice FERNS, MD  Active    Patient not taking:   Discontinued 01/05/24 1034 (Patient Preference)   senna-docusate (SENOKOT-S) 8.6-50 MG tablet [id number] Yes Take 1 tablet by mouth at bedtime as needed for mild constipation. Rosario Leatrice FERNS, MD  Active             Goals Addressed             This Visit's Progress    VBCI Transitions of Care (TOC) Care Plan       Problems:  Recent Hospitalization for treatment of sepsis  12/29/2023- still short of breath, dry cough,  no fever,  Went to Afib clinic yesterday.  In NSR.   Started on Amiodarone . 01/05/2024.  Saw cardiologist today and in NSR.  No changes in medications today.  Reports vitals good.  Pending LFT recheck Decrease in appetite 12/29/2023  appetite is still no good.  Feels like she is staying hydrated. 01/05/2024 reports stomach upset due to protonix ,  has stopped as of today.   Feels like punched in stomach and feels bruised inside.  Denies NVD. Bowels  moving well. ( 1-2 times per day)  Goal:  Over the next 30 days, the patient will not experience hospital readmission  Interventions:  Transitions of Care: Doctor Visits  - discussed the importance of doctor visits Reviewed all medications.  New medication- amiodarone . .   Reviewed importance of staying hydrated and eating well.  Encouraged adequate rest. Reviewed importance of keeping heart rate and BP log and taking to MD office. Reviewed pending follow up appointments.  Reviewed with patient that it is not suggested to take her normal Pepcid  with amiodarone .   Patient Self Care Activities:  Attend all scheduled provider appointments Call pharmacy for medication refills 3-7 days in advance of running out of medications Call provider office for new concerns or questions  Notify RN Care Manager of TOC call rescheduling needs Participate in Transition of Care Program/Attend TOC scheduled calls Take medications as prescribed   Stay hydrated Follow Afib action plan  Rest  Call your GI MD if needed for medications for reflux.   Plan:  Telephone follow up appointment with care management team member scheduled for:  01/12/2024  at 11 am The patient has been provided with contact information for the care management team and has been advised to call with any health related questions or concerns.         Recommendation:   Continue Current Plan of Care  Follow Up Plan:   Telephone follow up appointment date/time:  01/12/2024 at 11 am  Alan Ee, RN, BSN, [employer]- Transition of Care Team.  [facility] [phone]     "

## 2024-01-11 ENCOUNTER — Inpatient Hospital Stay (HOSPITAL_BASED_OUTPATIENT_CLINIC_OR_DEPARTMENT_OTHER)
Admission: EM | Admit: 2024-01-11 | Discharge: 2024-01-16 | DRG: 871 | Disposition: A | Discharge status: Home / Self Care | Attending: Internal Medicine | Admitting: Internal Medicine

## 2024-01-11 ENCOUNTER — Emergency Department (HOSPITAL_BASED_OUTPATIENT_CLINIC_OR_DEPARTMENT_OTHER): Admitting: Radiology

## 2024-01-11 ENCOUNTER — Ambulatory Visit: Payer: Self-pay

## 2024-01-11 ENCOUNTER — Other Ambulatory Visit: Payer: Self-pay

## 2024-01-11 ENCOUNTER — Encounter (HOSPITAL_BASED_OUTPATIENT_CLINIC_OR_DEPARTMENT_OTHER): Payer: Self-pay

## 2024-01-11 ENCOUNTER — Emergency Department (HOSPITAL_BASED_OUTPATIENT_CLINIC_OR_DEPARTMENT_OTHER)

## 2024-01-11 DIAGNOSIS — A419 Sepsis, unspecified organism: Secondary | ICD-10-CM | POA: Diagnosis present

## 2024-01-11 DIAGNOSIS — Z8249 Family history of ischemic heart disease and other diseases of the circulatory system: Secondary | ICD-10-CM | POA: Diagnosis not present

## 2024-01-11 DIAGNOSIS — J470 Bronchiectasis with acute lower respiratory infection: Secondary | ICD-10-CM | POA: Diagnosis present

## 2024-01-11 DIAGNOSIS — Z881 Allergy status to other antibiotic agents status: Secondary | ICD-10-CM | POA: Diagnosis not present

## 2024-01-11 DIAGNOSIS — R6510 Systemic inflammatory response syndrome (SIRS) of non-infectious origin without acute organ dysfunction: Secondary | ICD-10-CM | POA: Diagnosis not present

## 2024-01-11 DIAGNOSIS — Z79890 Hormone replacement therapy (postmenopausal): Secondary | ICD-10-CM

## 2024-01-11 DIAGNOSIS — M3500 Sicca syndrome, unspecified: Secondary | ICD-10-CM | POA: Diagnosis present

## 2024-01-11 DIAGNOSIS — Z7901 Long term (current) use of anticoagulants: Secondary | ICD-10-CM | POA: Diagnosis not present

## 2024-01-11 DIAGNOSIS — I480 Paroxysmal atrial fibrillation: Secondary | ICD-10-CM | POA: Diagnosis present

## 2024-01-11 DIAGNOSIS — T451X5A Adverse effect of antineoplastic and immunosuppressive drugs, initial encounter: Secondary | ICD-10-CM | POA: Diagnosis not present

## 2024-01-11 DIAGNOSIS — J189 Pneumonia, unspecified organism: Secondary | ICD-10-CM | POA: Diagnosis present

## 2024-01-11 DIAGNOSIS — J90 Pleural effusion, not elsewhere classified: Secondary | ICD-10-CM | POA: Diagnosis present

## 2024-01-11 DIAGNOSIS — R0789 Other chest pain: Secondary | ICD-10-CM | POA: Diagnosis present

## 2024-01-11 DIAGNOSIS — J9811 Atelectasis: Secondary | ICD-10-CM | POA: Diagnosis present

## 2024-01-11 DIAGNOSIS — Z823 Family history of stroke: Secondary | ICD-10-CM | POA: Diagnosis not present

## 2024-01-11 DIAGNOSIS — D84821 Immunodeficiency due to drugs: Secondary | ICD-10-CM | POA: Diagnosis present

## 2024-01-11 DIAGNOSIS — J9601 Acute respiratory failure with hypoxia: Secondary | ICD-10-CM | POA: Diagnosis present

## 2024-01-11 DIAGNOSIS — J849 Interstitial pulmonary disease, unspecified: Secondary | ICD-10-CM | POA: Diagnosis present

## 2024-01-11 DIAGNOSIS — T380X5A Adverse effect of glucocorticoids and synthetic analogues, initial encounter: Secondary | ICD-10-CM | POA: Diagnosis not present

## 2024-01-11 DIAGNOSIS — I4892 Unspecified atrial flutter: Secondary | ICD-10-CM | POA: Diagnosis present

## 2024-01-11 DIAGNOSIS — R509 Fever, unspecified: Principal | ICD-10-CM

## 2024-01-11 DIAGNOSIS — D472 Monoclonal gammopathy: Secondary | ICD-10-CM | POA: Diagnosis present

## 2024-01-11 DIAGNOSIS — Z79631 Long term (current) use of antimetabolite agent: Secondary | ICD-10-CM | POA: Diagnosis not present

## 2024-01-11 DIAGNOSIS — E039 Hypothyroidism, unspecified: Secondary | ICD-10-CM | POA: Diagnosis present

## 2024-01-11 DIAGNOSIS — I119 Hypertensive heart disease without heart failure: Secondary | ICD-10-CM | POA: Diagnosis present

## 2024-01-11 DIAGNOSIS — Z79899 Other long term (current) drug therapy: Secondary | ICD-10-CM | POA: Diagnosis not present

## 2024-01-11 DIAGNOSIS — K219 Gastro-esophageal reflux disease without esophagitis: Secondary | ICD-10-CM

## 2024-01-11 DIAGNOSIS — Z85828 Personal history of other malignant neoplasm of skin: Secondary | ICD-10-CM

## 2024-01-11 DIAGNOSIS — Z9842 Cataract extraction status, left eye: Secondary | ICD-10-CM

## 2024-01-11 DIAGNOSIS — J181 Lobar pneumonia, unspecified organism: Secondary | ICD-10-CM | POA: Diagnosis not present

## 2024-01-11 DIAGNOSIS — I10 Essential (primary) hypertension: Secondary | ICD-10-CM | POA: Diagnosis present

## 2024-01-11 DIAGNOSIS — Z9841 Cataract extraction status, right eye: Secondary | ICD-10-CM

## 2024-01-11 DIAGNOSIS — M0600 Rheumatoid arthritis without rheumatoid factor, unspecified site: Secondary | ICD-10-CM | POA: Diagnosis present

## 2024-01-11 DIAGNOSIS — I7300 Raynaud's syndrome without gangrene: Secondary | ICD-10-CM | POA: Diagnosis present

## 2024-01-11 LAB — CBC
HCT: 38.7 % (ref 36.0–46.0)
Hemoglobin: 12.6 g/dL (ref 12.0–15.0)
MCH: 30.4 pg (ref 26.0–34.0)
MCHC: 32.6 g/dL (ref 30.0–36.0)
MCV: 93.3 fL (ref 80.0–100.0)
Platelets: 356 K/uL (ref 150–400)
RBC: 4.15 MIL/uL (ref 3.87–5.11)
RDW: 14.4 % (ref 11.5–15.5)
WBC: 12.8 K/uL — ABNORMAL HIGH (ref 4.0–10.5)
nRBC: 0 % (ref 0.0–0.2)

## 2024-01-11 LAB — BASIC METABOLIC PANEL WITH GFR
Anion gap: 11 (ref 5–15)
BUN: 10 mg/dL (ref 8–23)
CO2: 26 mmol/L (ref 22–32)
Calcium: 9.7 mg/dL (ref 8.9–10.3)
Chloride: 99 mmol/L (ref 98–111)
Creatinine, Ser: 0.87 mg/dL (ref 0.44–1.00)
GFR, Estimated: >60 mL/min
Glucose, Bld: 178 mg/dL — ABNORMAL HIGH (ref 70–99)
Potassium: 4.2 mmol/L (ref 3.5–5.1)
Sodium: 136 mmol/L (ref 135–145)

## 2024-01-11 LAB — RESP PANEL BY RT-PCR (RSV, FLU A&B, COVID)  RVPGX2
Influenza A by PCR: NEGATIVE
Influenza B by PCR: NEGATIVE
Resp Syncytial Virus by PCR: NEGATIVE
SARS Coronavirus 2 by RT PCR: NEGATIVE

## 2024-01-11 LAB — TROPONIN T, HIGH SENSITIVITY
Troponin T High Sensitivity: <15 ng/L (ref 0–19)
Troponin T High Sensitivity: <15 ng/L (ref 0–19)

## 2024-01-11 LAB — LACTIC ACID, PLASMA: Lactic Acid, Venous: 1 mmol/L (ref 0.5–1.9)

## 2024-01-11 MED ORDER — SODIUM CHLORIDE 0.9 % IV SOLN
2.0000 g | Freq: Once | INTRAVENOUS | Status: AC
  Administered 2024-01-11: 2 g via INTRAVENOUS
  Filled 2024-01-11: qty 20

## 2024-01-11 MED ORDER — MORPHINE SULFATE (PF) 4 MG/ML IV SOLN
4.0000 mg | Freq: Once | INTRAVENOUS | Status: AC
  Administered 2024-01-11: 4 mg via INTRAVENOUS
  Filled 2024-01-11: qty 1

## 2024-01-11 MED ORDER — ALBUTEROL SULFATE (2.5 MG/3ML) 0.083% IN NEBU
3.0000 mL | INHALATION_SOLUTION | RESPIRATORY_TRACT | Status: DC | PRN
  Filled 2024-01-11 (×4): qty 3

## 2024-01-11 MED ORDER — CYCLOBENZAPRINE HCL 10 MG PO TABS
10.0000 mg | ORAL_TABLET | Freq: Three times a day (TID) | ORAL | Status: DC | PRN
  Administered 2024-01-12: 10 mg via ORAL
  Filled 2024-01-11 (×4): qty 1

## 2024-01-11 MED ORDER — CYCLOBENZAPRINE HCL 10 MG PO TABS
10.0000 mg | ORAL_TABLET | Freq: Once | ORAL | Status: AC
  Administered 2024-01-11: 10 mg via ORAL
  Filled 2024-01-11: qty 1

## 2024-01-11 MED ORDER — IOHEXOL 350 MG/ML SOLN
75.0000 mL | Freq: Once | INTRAVENOUS | Status: AC | PRN
  Administered 2024-01-11: 75 mL via INTRAVENOUS

## 2024-01-11 MED ORDER — SENNOSIDES-DOCUSATE SODIUM 8.6-50 MG PO TABS
1.0000 | ORAL_TABLET | Freq: Every evening | ORAL | Status: DC | PRN
  Filled 2024-01-11: qty 1

## 2024-01-11 MED ORDER — LACTATED RINGERS IV BOLUS
1000.0000 mL | Freq: Once | INTRAVENOUS | Status: AC
  Administered 2024-01-11: 1000 mL via INTRAVENOUS

## 2024-01-11 MED ORDER — HYDROMORPHONE HCL 1 MG/ML IJ SOLN
1.0000 mg | Freq: Once | INTRAMUSCULAR | Status: AC
  Administered 2024-01-11: 1 mg via INTRAVENOUS
  Filled 2024-01-11: qty 1

## 2024-01-11 MED ORDER — LACTATED RINGERS IV SOLN: INTRAVENOUS | Status: DC

## 2024-01-11 MED ORDER — FOLIC ACID 1 MG PO TABS
2.0000 mg | ORAL_TABLET | Freq: Every day | ORAL | Status: DC
  Administered 2024-01-12 – 2024-01-16 (×5): 2 mg via ORAL
  Filled 2024-01-11 (×6): qty 2

## 2024-01-11 MED ORDER — NITROGLYCERIN 0.4 MG SL SUBL
0.4000 mg | SUBLINGUAL_TABLET | SUBLINGUAL | Status: DC | PRN
  Filled 2024-01-11: qty 25

## 2024-01-11 MED ORDER — ACETAMINOPHEN 500 MG PO TABS
1000.0000 mg | ORAL_TABLET | Freq: Once | ORAL | Status: AC
  Administered 2024-01-11: 1000 mg via ORAL
  Filled 2024-01-11: qty 2

## 2024-01-11 MED ORDER — METOPROLOL TARTRATE 25 MG PO TABS
25.0000 mg | ORAL_TABLET | Freq: Two times a day (BID) | ORAL | Status: DC
  Administered 2024-01-11 – 2024-01-12 (×2): 25 mg via ORAL
  Filled 2024-01-11 (×2): qty 1

## 2024-01-11 MED ORDER — APIXABAN 5 MG PO TABS
5.0000 mg | ORAL_TABLET | Freq: Two times a day (BID) | ORAL | Status: DC
  Administered 2024-01-11 – 2024-01-16 (×10): 5 mg via ORAL
  Filled 2024-01-11 (×12): qty 1

## 2024-01-11 MED ORDER — METHOTREXATE SODIUM 2.5 MG PO TABS: 12.5000 mg | ORAL_TABLET | ORAL | Status: DC

## 2024-01-11 MED ORDER — FLUTICASONE PROPIONATE 50 MCG/ACT NA SUSP
2.0000 | Freq: Every day | NASAL | Status: DC | PRN
  Filled 2024-01-11: qty 16

## 2024-01-11 MED ORDER — AMIODARONE HCL 200 MG PO TABS
200.0000 mg | ORAL_TABLET | Freq: Every day | ORAL | Status: DC
  Administered 2024-01-11 – 2024-01-16 (×6): 200 mg via ORAL
  Filled 2024-01-11 (×7): qty 1

## 2024-01-11 NOTE — Plan of Care (Deleted)
 [facility] emergency department  79 year old female history of Sjogren syndrome, rheumatoid arthritis, interstitial lung disease, bronchiectasis, essential hypertension, paroxysmal atrial fibrillation on Eliquis  and amiodarone , hypothyroidism, esophageal stricture status post dilation, and who was recently hospitalized in December for sepsis of unknown etiology that then resulted in A-fib RVR patient is currently on metoprolol  and amiodarone  and Eliquis  reports since leaving the hospital she has had good days and bad days but 2 days ago started having a severe pain in her right shoulder blade area that is worse with taking a deep breath and certain movements. She is not gotten any better over the last 2 days but has gotten worse. She denies any fever, nausea or vomiting. She has had an occasional cough. No urinary complaints. She has tried taking Tylenol  at home with only minimal improvement. She feels short of breath because she cannot take a deep breath because of the pain.   At presentation to ED patient found febrile, borderline hypertensive otherwise hemodynamically stable.    Lab, CBC showed leukocyte 12.8 otherwise unremarkable.  BMP unremarkable. Flat troponin.  Normal lactic acid level.  Respiratory panel negative for COVID RSV flu.  Pending UA.  EKG showing sinus rhythm with premature atrial complex and bigeminy.  Left anterior fascicular block.  In the ED patient received Dilaudid , Flexeril   and ceftriaxone .  CTA chest ruled out PE. New small bilateral pleural effusions with associated passive atelectasis in the lower lobes, without pulmonary infarction, edema, or infection. 3. Mild pericardial thickening.  Chest x-ray showing small left oral effusion with adjacent airspace disease, favoring atelectasis. 2. Cardiomegaly with trace right pleural fluid. 3. Aortic atherosclerosis (ICD10-I70.0).  Pt meets SIRS criteria. . No source of infection yet. Per chart review patient has similar  presentation on admission in December 2025 when patient met SIRS criteria however unable to find source of infection. Patient again has similar presentation.  Hospitalist consulted for further evaluation management of fever of unknown origin and patient meets SIRS criteria. Of note patient has multiple antibiotic allergy history.  [facility] will assume care on arrival to accepting facility. Until arrival, care as per EDP. However, [facility] available 24/7 for questions and assistance. Check [email or web address] for on-call coverage. Nursing staff, please call [facility] Admits & Consults System-Wide number under Amion on patient's arrival so appropriate admitting provider can evaluate the pt.   Author: Galilee Pierron, MD  [facility]

## 2024-01-11 NOTE — Plan of Care (Signed)
 [facility] emergency department Christy Moore or [name] telemetry bed:  79 year old female history of Sjogren syndrome, rheumatoid arthritis, interstitial lung disease, bronchiectasis, essential hypertension, paroxysmal atrial fibrillation on Eliquis  and amiodarone , hypothyroidism, esophageal stricture status post dilation, and who was recently hospitalized in December for sepsis of unknown etiology that then resulted in A-fib RVR patient is currently on metoprolol  and amiodarone  and Eliquis  reports since leaving the hospital she has had good days and bad days but 2 days ago started having a severe pain in her right shoulder blade area that is worse with taking a deep breath and certain movements. She is not gotten any better over the last 2 days but has gotten worse. She denies any fever, nausea or vomiting. She has had an occasional cough. No urinary complaints. She has tried taking Tylenol  at home with only minimal improvement. She feels short of breath because she cannot take a deep breath because of the pain.   At presentation to ED patient found febrile, borderline hypertensive otherwise hemodynamically stable.    Lab, CBC showed leukocyte 12.8 otherwise unremarkable.  BMP unremarkable. Flat troponin.  Normal lactic acid level.  Respiratory panel negative for COVID RSV flu.  Pending UA.  EKG showing sinus rhythm with premature atrial complex and bigeminy.  Left anterior fascicular block.  In the ED patient received Dilaudid , Flexeril   and ceftriaxone .  CTA chest ruled out PE. New small bilateral pleural effusions with associated passive atelectasis in the lower lobes, without pulmonary infarction, edema, or infection. 3. Mild pericardial thickening.  Chest x-ray showing small left oral effusion with adjacent airspace disease, favoring atelectasis. 2. Cardiomegaly with trace right pleural fluid. 3. Aortic atherosclerosis (ICD10-I70.0).  Pt meets SIRS criteria. . No source of infection  yet. Per chart review patient has similar presentation on admission in December 2025 when patient met SIRS criteria however unable to find source of infection. Patient again has similar presentation.  Hospitalist consulted for further evaluation management of fever of unknown origin and patient meets SIRS criteria. Of note patient has multiple antibiotic allergy history.  [facility] will assume care on arrival to accepting facility. Until arrival, care as per EDP. However, [facility] available 24/7 for questions and assistance. Check [email or web address] for on-call coverage. Nursing staff, please call [facility] Admits & Consults System-Wide number under Amion on patient's arrival so appropriate admitting provider can evaluate the pt.   Author: Antoino Westhoff, MD  [facility]

## 2024-01-11 NOTE — ED Provider Notes (Signed)
 "  EMERGENCY DEPARTMENT AT [facility] Provider Note   CSN: [id number] Arrival date & time: 01/11/24  1117     Patient presents with: Chest Pain and Back Pain   Christy Moore is a 79 y.o. female.   Pt is 79 year old female with history of Sjogren syndrome, rheumatoid  arthritis, ILD, bronchiectasis, hypertension, hypothyroidism, cardiomegaly, esophageal stricture s/p dilatation, MGUS who was recently hospitalized in December for sepsis of unknown etiology that then resulted in A-fib RVR patient is currently on metoprolol  and amiodarone  and Eliquis  reports since leaving the hospital she has had good days and bad days but 2 days ago started having a severe pain in her right shoulder blade area that is worse with taking a deep breath and certain movements.  She is not gotten any better over the last 2 days but has gotten worse.  She denies any fever, nausea or vomiting.  She has had an occasional cough.  No urinary complaints.  She has tried taking Tylenol  at home with only minimal improvement.  She feels short of breath because she cannot take a deep breath because of the pain.  The history is provided by the patient, the spouse and medical records.  Chest Pain Associated symptoms: back pain   Back Pain Associated symptoms: chest pain        Prior to Admission medications  Medication Sig Start Date End Date Taking? Authorizing Provider  acetaminophen  (TYLENOL ) 325 MG tablet Take 2 tablets (650 mg total) by mouth every 6 (six) hours as needed for mild pain (pain score 1-3) or fever (or Fever >/= 101). 12/18/23   Rosario Leatrice FERNS, MD  albuterol  (VENTOLIN  HFA) 108 (90 Base) MCG/ACT inhaler Inhale 1-2 puffs into the lungs every 4 (four) hours as needed for wheezing or shortness of breath.    [provider]  amiodarone  (PACERONE ) 200 MG tablet Take 1 tablet (200 mg total) by mouth 2 (two) times daily for 14 days, THEN 1 tablet (200 mg total) daily. 12/28/23 01/10/25   Terra Fairy PARAS, PA-C  apixaban  (ELIQUIS ) 5 MG TABS tablet Take 1 tablet (5 mg total) by mouth 2 (two) times daily. 12/22/23   Vannie Reche RAMAN, NP  cyclobenzaprine  (FLEXERIL ) 10 MG tablet Take 1 tablet (10 mg total) by mouth 3 (three) times daily as needed for muscle spasms. 08/28/23   Johnny Garnette LABOR, MD  fluticasone  (FLONASE ) 50 MCG/ACT nasal spray Place 2 sprays into both nostrils daily as needed for allergies.    [provider]  folic acid  (FOLVITE ) 1 MG tablet Take 2 tablets (2 mg total) by mouth daily. 07/22/23   Dolphus Reiter, MD  levothyroxine  (SYNTHROID ) 100 MCG tablet TAKE 1 TABLET BY MOUTH EVERY DAY 12/09/23   Burchette, Wolm LELON, MD  methotrexate  (RHEUMATREX) 2.5 MG tablet Take 5 tablets (12.5 mg total) by mouth once a week. 11/30/23   Cheryl Waddell HERO, PA-C  metoprolol  tartrate (LOPRESSOR ) 25 MG tablet Take 1 tablet (25 mg total) by mouth 2 (two) times daily. 12/18/23   Rosario Leatrice FERNS, MD  nitroGLYCERIN  (NITROSTAT ) 0.4 MG SL tablet Place 1 tablet (0.4 mg total) under the tongue every 5 (five) minutes as needed for chest pain. 12/18/23   Rosario Leatrice FERNS, MD  senna-docusate (SENOKOT-S) 8.6-50 MG tablet Take 1 tablet by mouth at bedtime as needed for mild constipation. 12/18/23   Rosario Leatrice FERNS, MD    Allergies: Protonix  [pantoprazole ], Albuterol -budesonide, Amoxicillin -pot clavulanate, Azithromycin , Broccoli [brassica oleracea], Celecoxib, Ciprofloxacin, Doxycycline , Erythromycin, Hydrocodone,  Hydrocodone-acetaminophen , Levofloxacin, Omeprazole , Penicillins, Propoxyphene n-acetaminophen , Rofecoxib, Sulfa antibiotics, and Tetanus toxoid    Review of Systems  Cardiovascular:  Positive for chest pain.  Musculoskeletal:  Positive for back pain.    Updated Vital Signs BP (!) 155/74   Pulse 86   Temp (!) 101.2 F (38.4 C) (Rectal)   Resp 20   Ht 5' 4 (1.626 m)   Wt 64.6 kg   SpO2 95%   BMI 24.45 kg/m   Physical Exam Vitals and nursing note reviewed.   Constitutional:      General: She is in acute distress.     Appearance: She is well-developed.     Comments: Appears uncomfortable  HENT:     Head: Normocephalic and atraumatic.  Eyes:     Pupils: Pupils are equal, round, and reactive to light.  Cardiovascular:     Rate and Rhythm: Normal rate and regular rhythm.     Pulses: Normal pulses.     Heart sounds: Normal heart sounds. No murmur heard.    No friction rub.  Pulmonary:     Effort: Pulmonary effort is normal.     Breath sounds: Normal breath sounds. No wheezing or rales.    Chest:     Chest wall: Tenderness present.    Abdominal:     General: Bowel sounds are normal. There is no distension.     Palpations: Abdomen is soft.     Tenderness: There is no abdominal tenderness. There is no right CVA tenderness, left CVA tenderness, guarding or rebound.  Musculoskeletal:        General: No tenderness. Normal range of motion.     Right lower leg: No edema.     Left lower leg: No edema.     Comments: No edema  Skin:    General: Skin is warm and dry.     Findings: No rash.     Comments: Feels hot to the touch  Neurological:     Mental Status: She is alert and oriented to person, place, and time.     Cranial Nerves: No cranial nerve deficit.  Psychiatric:        Behavior: Behavior normal.     (all labs ordered are listed, but only abnormal results are displayed) Labs Reviewed  BASIC METABOLIC PANEL WITH GFR - Abnormal; Notable for the following components:      Result Value   Glucose, Bld 178 (*)    All other components within normal limits  CBC - Abnormal; Notable for the following components:   WBC 12.8 (*)    All other components within normal limits  RESP PANEL BY RT-PCR (RSV, FLU A&B, COVID)  [id number]  LACTIC ACID, PLASMA  LACTIC ACID, PLASMA  URINALYSIS, W/ REFLEX TO CULTURE (INFECTION SUSPECTED)  TROPONIN T, HIGH SENSITIVITY  TROPONIN T, HIGH SENSITIVITY    EKG: EKG Interpretation Date/Time:  Monday  January 11 2024 11:41:27 EST Ventricular Rate:  72 PR Interval:  186 QRS Duration:  126 QT Interval:  410 QTC Calculation: 448 R Axis:   -73  Text Interpretation: Sinus rhythm with Premature atrial complexes in a pattern of bigeminy Right atrial enlargement Right bundle branch block Left anterior fascicular block Bifascicular block Left ventricular hypertrophy with repolarization abnormality ( R in aVL ) Cannot rule out Septal infarct (cited on or before 28-Dec-2023) Possible Lateral infarct , age undetermined When compared with ECG of 28-Dec-2023 13:43, Premature atrial complexes are now Present T wave amplitude has decreased in Inferior leads T  wave inversion more evident in Lateral leads Confirmed by Doretha Folks [phone]) on 01/11/2024 4:46:39 PM  Radiology: CT Angio Chest PE W and/or Wo Contrast Result Date: 01/11/2024 EXAM: CTA CHEST 01/11/2024 06:01:45 PM TECHNIQUE: CTA of the chest was performed after the administration of 75 mL of iohexol  (OMNIPAQUE ) 350 MG/ML injection. Multiplanar reformatted images are provided for review. MIP images are provided for review. Automated exposure control, iterative reconstruction, and/or weight based adjustment of the mA/kV was utilized to reduce the radiation dose to as low as reasonably achievable. COMPARISON: CT chest 12/14/2000. CLINICAL HISTORY: Pulmonary embolism (PE) suspected, high prob. FINDINGS: PULMONARY ARTERIES: Pulmonary arteries are adequately opacified for evaluation. No acute pulmonary embolus. Main pulmonary artery is normal in caliber. MEDIASTINUM: Mild pericardial thickening. There is no acute abnormality of the thoracic aorta. LYMPH NODES: No mediastinal, hilar or axillary lymphadenopathy. LUNGS AND PLEURA: New bilateral small pleural effusions with associated passive atelectasis of the lower lobes. No pulmonary infarction. No pulmonary edema or infection identified. No pneumothorax. UPPER ABDOMEN: Limited images of the upper abdomen are  unremarkable. SOFT TISSUES AND BONES: No acute bone or soft tissue abnormality. IMPRESSION: 1. No evidence of pulmonary embolism. 2. New small bilateral pleural effusions with associated passive atelectasis in the lower lobes, without pulmonary infarction, edema, or infection. 3. Mild pericardial thickening. Electronically signed by: Norleen Boxer MD 01/11/2024 06:54 PM EST RP Workstation: [id number]   DG Chest 2 View Result Date: 01/11/2024 EXAM: 2 VIEW(S) XRAY OF THE CHEST 01/11/2024 12:23:48 PM COMPARISON: 12/17/2023 CLINICAL HISTORY: cp FINDINGS: LINES, TUBES AND DEVICES: EKG lead artifacts project over the upper lungs. LUNGS AND PLEURA: Improved left base airspace disease. Small left pleural effusion, minimally decreased. Trace right pleural fluid. No pneumothorax. HEART AND MEDIASTINUM: Mild cardiomegaly. Transverse aortic atherosclerosis. BONES AND SOFT TISSUES: No acute osseous abnormality. IMPRESSION: 1. Decreased small left pleural effusion with adjacent airspace disease, favoring atelectasis. 2. Cardiomegaly with trace right pleural fluid. 3. Aortic atherosclerosis (ICD10-I70.0). Electronically signed by: Rockey Kilts MD 01/11/2024 01:11 PM EST RP Workstation: [id number]     Procedures   Medications Ordered in the ED  cefTRIAXone  (ROCEPHIN ) 2 g in sodium chloride  0.9 % 100 mL IVPB (2 g Intravenous New Bag/Given 01/11/24 1912)  HYDROmorphone  (DILAUDID ) injection 1 mg (has no administration in time range)  lactated ringers  infusion (has no administration in time range)  morphine  (PF) 4 MG/ML injection 4 mg (4 mg Intravenous Given 01/11/24 1743)  cyclobenzaprine  (FLEXERIL ) tablet 10 mg (10 mg Oral Given 01/11/24 1740)  lactated ringers  bolus 1,000 mL (0 mLs Intravenous Stopped 01/11/24 1910)  iohexol  (OMNIPAQUE ) 350 MG/ML injection 75 mL (75 mLs Intravenous Contrast Given 01/11/24 1801)  acetaminophen  (TYLENOL ) tablet 1,000 mg (1,000 mg Oral Given 01/11/24 1818)  morphine  (PF) 4 MG/ML injection 4 mg (4 mg  Intravenous Given 01/11/24 1818)                                    Medical Decision Making Amount and/or Complexity of Data Reviewed Independent Historian: spouse External Data Reviewed: notes. Labs: ordered. Decision-making details documented in ED Course. Radiology: ordered and independent interpretation performed. Decision-making details documented in ED Course. ECG/medicine tests: ordered and independent interpretation performed. Decision-making details documented in ED Course.  Risk OTC drugs. Prescription drug management. Decision regarding hospitalization.   Pt with multiple medical problems and comorbidities and presenting today with a complaint that caries a high risk for morbidity and  mortality.  Today with the above complaints.  Concern for recurrent infection.  Patient's oral temperature is normal but she feels very warm on exam and will check a rectal temperature.  Will do undifferentiated sepsis at this time.  Also concern for possible PE, pneumothorax.  Lower suspicion for acute abdominal pathology such as cholecystitis, hepatitis, pancreatitis or pyelonephritis.  Low suspicion at this time for acute cardiac cause of her pain such as MI, dissection, or dysrhythmia.  I independently interpreted patient's labs and EKG.  EKG without acute changes or ST changes concerning for angina.  BMP within normal limits, CBC with mild leukocytosis of 12.8, troponin is normal, lactic acid is pending.  Rectal temperature was 101.2. I have independently visualized and interpreted pt's images today.  Chest x-ray without acute findings.  Radiology reports bilateral small effusions and possible atelectasis.  CT of the chest ordered to rule out PE or recurrent infection.\  CTA of the chest showed no evidence of pulmonary embolism and new small bilateral pleural effusion with associated atelectasis in the lower lobes without infarct edema or infection.  Mild pericardial thickening but troponins have  been negative.  On repeat evaluation patient is still in significant pain and is still febrile at this time.  She was covered with Rocephin  due to her multiple antibiotic allergies and that it appears to be what helped her symptoms the last time.  At this time it is infection of unknown origin.  Still waiting on a UA but she has no flank tenderness or lower abdominal tenderness to suggest an abdominal source for her fever.  Patient is able to range her shoulder and low suspicion for septic joint.  At this time feel that she needs admission for SIRS and recurrent fever and pain.  Cussed this with the patient and her husband.  They are comfortable with this plan.  Consulted hospitalist for admission.       Final diagnoses:  Fever of unknown origin (FUO)  Other chest pain  SIRS (systemic inflammatory response syndrome) [facility])    ED Discharge Orders     None          Doretha Folks, MD 01/11/24 2005  "

## 2024-01-11 NOTE — ED Triage Notes (Addendum)
 Patient reports chest pain that radiates to back and shoulder. Has been going on for a month and a half. Reports being seen at [facility] for sepsis. Reports history of sinus pauses.

## 2024-01-11 NOTE — Telephone Encounter (Signed)
 FYI Only or Action Required?: FYI only for provider: ED advised.  Patient was last seen in primary care on 12/21/2023 by Micheal Wolm ORN, MD.  Called Nurse Triage reporting Shortness of Breath.  Symptoms began a week ago.  Interventions attempted: OTC medications: tylenol  and Prescription medications: eliqis, amiodarone , metoprolol .  Symptoms are: gradually worsening.  Triage Disposition: Go to ED Now (Notify PCP)  Patient/caregiver understands and will follow disposition?: Yes  Copied from CRM [id number]. Topic: Clinical - Red Word Triage >> Jan 11, 2024  9:18 AM Aleatha BROCKS wrote: Red Word that prompted transfer to Nurse Triage: Patient believes to have septis pain, she is experiencing shortness of breathe, pain in side and shoulder Reason for Disposition  Illness requiring prolonged bedrest in past month (e.g., immobilization, long hospital stay)  Answer Assessment - Initial Assessment Questions Pt with recent admission for sepsis with hypoxic resp failure 12/9- 12/12 Thought to be sepsis induced afib with RVR and metoprolol  admisistration by rapid response causing 17second pause in heart rhythm with conversion to NSR.  Since discharge- feels like pain where the infection may have settled above her breast bone. Can be incapacitating sometimes. Last night could not sleep. Pain to the right ribs all the way around to the back.  Pain between neck and shoulder in the shoulder blade feeling like someone drilling through her shoulder blade. Pain 8/10 currently but 9/10 overnight Stiff and sore. Tylenol  q6h to dull the pain, Eliquis , amiodarone    Advised return to ED to assess. Patient thankful for guidance and will head back to [facility].   1. RESPIRATORY STATUS: Describe your breathing? (e.g., wheezing, shortness of breath, unable to speak, severe coughing)      Cannot take a deep breath 2. ONSET: When did this breathing problem begin?      12/9 3. PATTERN Does the difficult  breathing come and go, or has it been constant since it started?      Worse with activity 4. SEVERITY: How bad is your breathing? (e.g., mild, moderate, severe)      Mild-moderate 5. RECURRENT SYMPTOM: Have you had difficulty breathing before? If Yes, ask: When was the last time? and What happened that time?      Yes with ED visit  6. CARDIAC HISTORY: Do you have any history of heart disease? (e.g., heart attack, angina, bypass surgery, angioplasty)      Recent sepsis induced Afib with RVR and conversion pause 7. LUNG HISTORY: Do you have any history of lung disease?  (e.g., pulmonary embolus, asthma, emphysema)     Left pleural effusion,  8. CAUSE: What do you think is causing the breathing problem?      sepsis 9. OTHER SYMPTOMS: Do you have any other symptoms? (e.g., chest pain, cough, dizziness, fever, runny nose)     Rib pain  10. O2 SATURATION MONITOR:  Do you use an oxygen saturation monitor (pulse oximeter) at home? If Yes, ask: What is your reading (oxygen level) today? What is your usual oxygen saturation reading? (e.g., 95%)       uta  Protocols used: Breathing Difficulty-A-AH

## 2024-01-12 ENCOUNTER — Telehealth: Payer: Self-pay

## 2024-01-12 ENCOUNTER — Encounter (HOSPITAL_COMMUNITY): Payer: Self-pay | Admitting: Internal Medicine

## 2024-01-12 DIAGNOSIS — R6510 Systemic inflammatory response syndrome (SIRS) of non-infectious origin without acute organ dysfunction: Secondary | ICD-10-CM | POA: Diagnosis not present

## 2024-01-12 DIAGNOSIS — D84821 Immunodeficiency due to drugs: Secondary | ICD-10-CM

## 2024-01-12 DIAGNOSIS — J189 Pneumonia, unspecified organism: Secondary | ICD-10-CM | POA: Diagnosis present

## 2024-01-12 DIAGNOSIS — I480 Paroxysmal atrial fibrillation: Secondary | ICD-10-CM | POA: Diagnosis present

## 2024-01-12 DIAGNOSIS — J90 Pleural effusion, not elsewhere classified: Secondary | ICD-10-CM | POA: Diagnosis present

## 2024-01-12 DIAGNOSIS — M0600 Rheumatoid arthritis without rheumatoid factor, unspecified site: Secondary | ICD-10-CM

## 2024-01-12 LAB — RESPIRATORY PANEL BY PCR

## 2024-01-12 LAB — CBC WITH DIFFERENTIAL/PLATELET
Abs Immature Granulocytes: 0.07 K/uL (ref 0.00–0.07)
Basophils Absolute: 0 K/uL (ref 0.0–0.1)
Basophils Relative: 0 %
Eosinophils Absolute: 0.1 K/uL (ref 0.0–0.5)
Eosinophils Relative: 1 %
HCT: 35.7 % — ABNORMAL LOW (ref 36.0–46.0)
Hemoglobin: 11.6 g/dL — ABNORMAL LOW (ref 12.0–15.0)
Immature Granulocytes: 1 %
Lymphocytes Relative: 4 %
Lymphs Abs: 0.6 K/uL — ABNORMAL LOW (ref 0.7–4.0)
MCH: 30.8 pg (ref 26.0–34.0)
MCHC: 32.5 g/dL (ref 30.0–36.0)
MCV: 94.7 fL (ref 80.0–100.0)
Monocytes Absolute: 0.3 K/uL (ref 0.1–1.0)
Monocytes Relative: 2 %
Neutro Abs: 13 K/uL — ABNORMAL HIGH (ref 1.7–7.7)
Neutrophils Relative %: 92 %
Platelets: 291 K/uL (ref 150–400)
RBC: 3.77 MIL/uL — ABNORMAL LOW (ref 3.87–5.11)
RDW: 14.2 % (ref 11.5–15.5)
WBC: 14.1 K/uL — ABNORMAL HIGH (ref 4.0–10.5)
nRBC: 0 % (ref 0.0–0.2)

## 2024-01-12 LAB — URINALYSIS, W/ REFLEX TO CULTURE (INFECTION SUSPECTED)
Bacteria, UA: NONE SEEN
Bilirubin Urine: NEGATIVE
Glucose, UA: NEGATIVE mg/dL
Hgb urine dipstick: NEGATIVE
Ketones, ur: 5 mg/dL — AB
Leukocytes,Ua: NEGATIVE
Nitrite: NEGATIVE
Protein, ur: NEGATIVE mg/dL
Specific Gravity, Urine: 1.038 — ABNORMAL HIGH (ref 1.005–1.030)
pH: 5 (ref 5.0–8.0)

## 2024-01-12 LAB — BASIC METABOLIC PANEL WITH GFR
Anion gap: 11 (ref 5–15)
BUN: 7 mg/dL — ABNORMAL LOW (ref 8–23)
CO2: 25 mmol/L (ref 22–32)
Calcium: 9.1 mg/dL (ref 8.9–10.3)
Chloride: 101 mmol/L (ref 98–111)
Creatinine, Ser: 0.71 mg/dL (ref 0.44–1.00)
GFR, Estimated: >60 mL/min
Glucose, Bld: 104 mg/dL — ABNORMAL HIGH (ref 70–99)
Potassium: 4.2 mmol/L (ref 3.5–5.1)
Sodium: 136 mmol/L (ref 135–145)

## 2024-01-12 LAB — TSH: TSH: 1.16 u[IU]/mL (ref 0.350–4.500)

## 2024-01-12 LAB — PROCALCITONIN: Procalcitonin: 0.23 ng/mL

## 2024-01-12 LAB — SEDIMENTATION RATE: Sed Rate: 83 mm/h — ABNORMAL HIGH (ref 0–22)

## 2024-01-12 LAB — LACTIC ACID, PLASMA: Lactic Acid, Venous: 1.9 mmol/L (ref 0.5–1.9)

## 2024-01-12 LAB — PRO BRAIN NATRIURETIC PEPTIDE: Pro Brain Natriuretic Peptide: 1011 pg/mL — ABNORMAL HIGH

## 2024-01-12 LAB — STREP PNEUMONIAE URINARY ANTIGEN: Strep Pneumo Urinary Antigen: NEGATIVE

## 2024-01-12 LAB — C-REACTIVE PROTEIN: CRP: 23.4 mg/dL — ABNORMAL HIGH

## 2024-01-12 MED ORDER — MORPHINE SULFATE 15 MG PO TABS
7.5000 mg | ORAL_TABLET | ORAL | Status: DC | PRN
  Filled 2024-01-12: qty 6

## 2024-01-12 MED ORDER — FUROSEMIDE 10 MG/ML IJ SOLN
20.0000 mg | Freq: Every day | INTRAMUSCULAR | Status: DC
  Administered 2024-01-13: 20 mg via INTRAVENOUS
  Filled 2024-01-12 (×3): qty 2

## 2024-01-12 MED ORDER — SALINE SPRAY 0.65 % NA SOLN
1.0000 | NASAL | Status: DC | PRN
  Filled 2024-01-12: qty 44

## 2024-01-12 MED ORDER — BISACODYL 5 MG PO TBEC
5.0000 mg | DELAYED_RELEASE_TABLET | Freq: Every day | ORAL | Status: DC | PRN
  Filled 2024-01-12: qty 1

## 2024-01-12 MED ORDER — METHYLPREDNISOLONE SODIUM SUCC 40 MG IJ SOLR
40.0000 mg | INTRAMUSCULAR | Status: AC
  Administered 2024-01-12: 40 mg via INTRAVENOUS
  Filled 2024-01-12: qty 1

## 2024-01-12 MED ORDER — ACETAMINOPHEN 650 MG RE SUPP: 650.0000 mg | Freq: Four times a day (QID) | RECTAL | Status: DC | PRN

## 2024-01-12 MED ORDER — METOPROLOL TARTRATE 25 MG PO TABS
25.0000 mg | ORAL_TABLET | Freq: Two times a day (BID) | ORAL | Status: DC
  Administered 2024-01-12 – 2024-01-16 (×8): 25 mg via ORAL
  Filled 2024-01-12 (×10): qty 1

## 2024-01-12 MED ORDER — FAMOTIDINE 20 MG PO TABS
20.0000 mg | ORAL_TABLET | Freq: Every day | ORAL | Status: DC
  Filled 2024-01-12 (×6): qty 1

## 2024-01-12 MED ORDER — SODIUM CHLORIDE 0.9% FLUSH
3.0000 mL | Freq: Two times a day (BID) | INTRAVENOUS | Status: DC
  Administered 2024-01-12 – 2024-01-16 (×5): 3 mL via INTRAVENOUS

## 2024-01-12 MED ORDER — ONDANSETRON HCL 4 MG PO TABS
4.0000 mg | ORAL_TABLET | Freq: Four times a day (QID) | ORAL | Status: DC | PRN
  Filled 2024-01-12 (×4): qty 1

## 2024-01-12 MED ORDER — MORPHINE SULFATE (PF) 4 MG/ML IV SOLN
4.0000 mg | Freq: Once | INTRAVENOUS | Status: AC
  Administered 2024-01-12: 4 mg via INTRAVENOUS
  Filled 2024-01-12: qty 1

## 2024-01-12 MED ORDER — SODIUM CHLORIDE 0.9 % IV SOLN: 2.0000 g | INTRAVENOUS | Status: DC

## 2024-01-12 MED ORDER — BENZONATATE 100 MG PO CAPS
100.0000 mg | ORAL_CAPSULE | Freq: Once | ORAL | Status: DC | PRN
  Filled 2024-01-12: qty 1

## 2024-01-12 MED ORDER — ONDANSETRON HCL 4 MG/2ML IJ SOLN: 4.0000 mg | Freq: Four times a day (QID) | INTRAMUSCULAR | Status: DC | PRN

## 2024-01-12 MED ORDER — GUAIFENESIN ER 600 MG PO TB12
600.0000 mg | ORAL_TABLET | Freq: Two times a day (BID) | ORAL | Status: DC
  Filled 2024-01-12: qty 1

## 2024-01-12 MED ORDER — ACETAMINOPHEN 325 MG PO TABS
650.0000 mg | ORAL_TABLET | Freq: Four times a day (QID) | ORAL | Status: DC | PRN
  Administered 2024-01-12: 650 mg via ORAL
  Filled 2024-01-12: qty 2

## 2024-01-12 MED ORDER — SODIUM CHLORIDE 0.9 % IV SOLN
2.0000 g | INTRAVENOUS | Status: DC
  Administered 2024-01-12 – 2024-01-14 (×3): 2 g via INTRAVENOUS
  Filled 2024-01-12 (×3): qty 20

## 2024-01-12 MED ORDER — MORPHINE SULFATE 15 MG PO TABS: 7.5000 mg | ORAL_TABLET | ORAL | 0 refills | Status: DC | PRN

## 2024-01-12 MED ORDER — SODIUM CHLORIDE 0.9 % IV SOLN
500.0000 mg | INTRAVENOUS | Status: AC
  Administered 2024-01-12 – 2024-01-14 (×3): 500 mg via INTRAVENOUS
  Filled 2024-01-12 (×3): qty 5

## 2024-01-12 MED ORDER — LEVOTHYROXINE SODIUM 100 MCG PO TABS
100.0000 ug | ORAL_TABLET | Freq: Every day | ORAL | Status: DC
  Administered 2024-01-12 – 2024-01-16 (×5): 100 ug via ORAL
  Filled 2024-01-12 (×6): qty 1

## 2024-01-12 MED ORDER — FUROSEMIDE 20 MG PO TABS
20.0000 mg | ORAL_TABLET | Freq: Every day | ORAL | Status: DC
  Administered 2024-01-12: 20 mg via ORAL
  Filled 2024-01-12: qty 1

## 2024-01-12 MED ORDER — ACETAMINOPHEN 500 MG PO TABS
1000.0000 mg | ORAL_TABLET | Freq: Four times a day (QID) | ORAL | Status: DC | PRN
  Administered 2024-01-13: 500 mg via ORAL
  Filled 2024-01-12 (×4): qty 2

## 2024-01-12 NOTE — Progress Notes (Signed)
"   Arrived at [facility] to pick up patient. Upon arrival the nurse notified myself and EMT Nat BROCKS that she had given the patient her last in hospital iv meds. The patient was A&O x4 and was ambulatory. We did bring a portable O2 concentrator with us , however the patient was not on hospital O2 at this time. We took one set of vitals while in her room. We then took the patient via a wheelchair to our [facility] vehicle. When we arrived at the patients home, she was able to ambulate into her home. While EMT Nat BROCKS was explaining the equipment, I took the time to setup the internet access point and other equipment. We were able to get the arm band and blood pressure cuff to pair with the tablet with no issues. While assessing vitals again the patient's O2 stats had dropped to between 90 & 92%. So we opted to use the regular O2 concentrator we brought from the hub. After a few minutes her O2 stat rose to 97%. We advised the virtual nurse and she agreed that the patient should stay on O2 for the time being. EMT Nat made sure that all the vitals made it into EPIC. We advised the patient and husband that Paramedic Delon would stop by soon and that Virtual Nurse Edsel would be contacting them through the tablet.... ....[email or web address].[email or web address].................. "

## 2024-01-12 NOTE — Hospital Course (Addendum)
 CC: chest pain for 1.5 months HPI: Christy Moore is a 79 y.o. female with medical history significant for PAF on Eliquis  and amiodarone , bifascicular block, Sjogren syndrome, rheumatoid arthritis, mild ILD with bronchiectasis, HTN, hypothyroidism, esophageal stricture s/p dilation, MGUS who presented to the ED for evaluation of chest pain.   Patient recently admitted from 12/15/2023-12/18/2023.  Initially presented with atypical chest pain and mild hypoxia.  She met SIRS criteria.  She had negative infectious workup.  During admission she went to atrial flutter with RVR.  She was given metoprolol  and converted back to sinus rhythm but did have a 17-second pause.  She was seen by cardiology.  It was felt that A-fib/flutter was triggered by infection.  She has been started on Eliquis , metoprolol , and amiodarone .   Patient returns with musculoskeletal pain throughout her thorax.  She says pain is currently on the right side of her chest and feels like muscle soreness.  She says mostly the pain has been from her hips up to her mid back.  She is also having pain above her right clavicle.  She has chronic respiratory issues with bronchiectasis and states that she has never been able to take deep breaths.  She reports appetite has been low for several weeks and she has had an unintentional 5 pound weight loss.  She reports early satiety without dysphagia.   She has had some shaking chills without diaphoresis.  She has not really had a cough.  She had an episode of nausea and vomiting on arrival to the floor but no other recent episodes.  She has not had abdominal pain.  She reports good urine output without dysuria.  She has not had any diarrhea.  She has not had any swelling in her lower extremities.  She denies any skin changes or rash.  No obvious bug, tick, insect bites.   She follows with rheumatology, Dr. Dolphus, for Sjogren syndrome and seronegative rheumatoid arthritis and has been on methotrexate   which she takes every Sunday.  Patient states she has not had any issues with joint pain or swelling.   [facility] ED Course  Labs/Imaging on admission: I have personally reviewed following labs and imaging studies.   Initial vitals showed BP 158/73, pulse 86, RR 20, temp 98.8 F, SpO2 99% on room air.  Tmax 101.2 F rectally.   Labs showed WBC 12.8, hemoglobin 12.6, platelets 356, sodium 136, potassium 4.2, bicarb 26, BUN 10, creatinine 0.87, serum glucose 178, troponin T <15 x 2, lactic acid 1.0.   SARS-CoV-2, influenza, RSV PCR negative.  Blood cultures and urinalysis ordered and pending collection.   CTA chest negative for PE.  New small bilateral pleural effusions with associated passive atelectasis in the lower lobes seen without pulmonary infarction, edema, or infection.  Mild pericardial thickening noted.   Patient was given IV morphine  4 mg x 2, IV Dilaudid  1 mg, 1 L LR, and IV ceftriaxone .  The hospitalist service was consulted for admission.  Significant Events: Admitted 01/11/2024 for SIRS/fever   Admission Labs: WBC 12.8, HgB 12.6, Plt 356 Na 136, K 4.2, CO2 of 26, BUN 10, Scr 0.87, glu 178 Lactic acid 1.0 Covid/Flu/RSV negative UA negative RVP negative TSH 1.160 ProBNP 1011 Procalcitonin 0.23 ESR 83 CRP 23.4  Admission Imaging Studies: CXR Decreased small left pleural effusion with adjacent airspace disease, favoring atelectasis. 2. Cardiomegaly with trace right pleural fluid. 3. Aortic atherosclerosis (ICD10-I70.0). CTPA No evidence of pulmonary embolism. 2. New small bilateral pleural effusions with  associated passive atelectasis in the lower lobes, without pulmonary infarction, edema, or infection. 3. Mild pericardial thickening.  Significant Labs: Cr trend: 0.87 >> 0.71 >> 0.85 >> 0.789 today WBC trend: 12.8 >> 14.1>> 15.5 >> 25.0 today (on IV steroids)  Significant Imaging Studies:

## 2024-01-12 NOTE — Assessment & Plan Note (Addendum)
 01/12/2024 she meets sepsis criteria on admission.White count 12.8, 84% room air saturations, fever 101.2.  Due to pneumonia.  acute respiratory with hypoxia as the acute organ dysfunction. 1/7-->10/26 - sepsis physiology improved.  WBC remains elevated likely due to steroids. Clinically improving.

## 2024-01-12 NOTE — H&P (Signed)
 " History and Physical    Christy Moore FMW:[id number] DOB: 1945/04/14 DOA: 01/11/2024  PCP: Micheal Wolm LELON, MD  Patient coming from: Home  I have personally briefly reviewed patient's old medical records in [facility]  Chief Complaint: chest pain  HPI: Christy Moore is a 79 y.o. female with medical history significant for PAF on Eliquis  and amiodarone , bifascicular block, Sjogren syndrome, rheumatoid arthritis, mild ILD with bronchiectasis, HTN, hypothyroidism, esophageal stricture s/p dilation, MGUS who presented to the ED for evaluation of chest pain.  Patient recently admitted from 12/15/2023-12/18/2023.  Initially presented with atypical chest pain and mild hypoxia.  She met SIRS criteria.  She had negative infectious workup.  During admission she went to atrial flutter with RVR.  She was given metoprolol  and converted back to sinus rhythm but did have a 17-second pause.  She was seen by cardiology.  It was felt that A-fib/flutter was triggered by infection.  She has been started on Eliquis , metoprolol , and amiodarone .  Patient returns with musculoskeletal pain throughout her thorax.  She says pain is currently on the right side of her chest and feels like muscle soreness.  She says mostly the pain has been from her hips up to her mid back.  She is also having pain above her right clavicle.  She has chronic respiratory issues with bronchiectasis and states that she has never been able to take deep breaths.  She reports appetite has been low for several weeks and she has had an unintentional 5 pound weight loss.  She reports early satiety without dysphagia.  She has had some shaking chills without diaphoresis.  She has not really had a cough.  She had an episode of nausea and vomiting on arrival to the floor but no other recent episodes.  She has not had abdominal pain.  She reports good urine output without dysuria.  She has not had any diarrhea.  She has not had any swelling in her  lower extremities.  She denies any skin changes or rash.  No obvious bug, tick, insect bites.  She follows with rheumatology, Dr. Dolphus, for Sjogren syndrome and seronegative rheumatoid arthritis and has been on methotrexate  which she takes every Sunday.  Patient states she has not had any issues with joint pain or swelling.  [facility] ED Course  Labs/Imaging on admission: I have personally reviewed following labs and imaging studies.  Initial vitals showed BP 158/73, pulse 86, RR 20, temp 98.8 F, SpO2 99% on room air.  Tmax 101.2 F rectally.  Labs showed WBC 12.8, hemoglobin 12.6, platelets 356, sodium 136, potassium 4.2, bicarb 26, BUN 10, creatinine 0.87, serum glucose 178, troponin T <15 x 2, lactic acid 1.0.  SARS-CoV-2, influenza, RSV PCR negative.  Blood cultures and urinalysis ordered and pending collection.  CTA chest negative for PE.  New small bilateral pleural effusions with associated passive atelectasis in the lower lobes seen without pulmonary infarction, edema, or infection.  Mild pericardial thickening noted.  Patient was given IV morphine  4 mg x 2, IV Dilaudid  1 mg, 1 L LR, and IV ceftriaxone .  The hospitalist service was consulted for admission.  Review of Systems: All systems reviewed and are negative except as documented in history of present illness above.   Past Medical History:  Diagnosis Date   Arthritis    Cardiomegaly    Chronic pneumonia    Depression    GERD (gastroesophageal reflux disease)    Hypertension    Hypothyroidism  Paroxysmal atrial fibrillation (HCC) 01/12/2024   Raynaud disease    Seizures (HCC)    Sinus tachycardia    Sjogren's syndrome    Status post dilation of esophageal narrowing 2012   Dr. Luis    Past Surgical History:  Procedure Laterality Date   BASAL CELL CARCINOMA EXCISION  2024   CATARACT EXTRACTION, BILATERAL  2024   HAND TENDON SURGERY  02/09/2007   tendon transfer-Dr. Camella   NASAL SEPTUM  SURGERY     operated on by Dr. Floy 20 years ago   OVARIAN CYST REMOVAL     Right Sided Salpingo-oopherectomy   SALPINGOOPHORECTOMY     WISDOM TOOTH EXTRACTION      Social History: Social History[1]  Allergies[2]  Family History  Problem Relation Age of Onset   Cancer Mother        breast and lung   Stroke Mother 17   Hypothyroidism Mother    Cancer Father        prostate   Heart attack Father 55   Alcohol abuse Maternal Grandmother    Alcohol abuse Maternal Grandfather    Allergies Other        Grandmother-pt states she takes after grandmother who had multiple allergies   Colon cancer Neg Hx    Esophageal cancer Neg Hx    Stomach cancer Neg Hx    Rectal cancer Neg Hx      Prior to Admission medications  Medication Sig Start Date End Date Taking? Authorizing Provider  acetaminophen  (TYLENOL ) 325 MG tablet Take 2 tablets (650 mg total) by mouth every 6 (six) hours as needed for mild pain (pain score 1-3) or fever (or Fever >/= 101). 12/18/23   Rosario Leatrice FERNS, MD  albuterol  (VENTOLIN  HFA) 108 [id number] Base) MCG/ACT inhaler Inhale 1-2 puffs into the lungs every 4 (four) hours as needed for wheezing or shortness of breath.    [provider]  amiodarone  (PACERONE ) 200 MG tablet Take 1 tablet (200 mg total) by mouth 2 (two) times daily for 14 days, THEN 1 tablet (200 mg total) daily. 12/28/23 01/10/25  Terra Fairy PARAS, PA-C  apixaban  (ELIQUIS ) 5 MG TABS tablet Take 1 tablet (5 mg total) by mouth 2 (two) times daily. 12/22/23   Vannie Reche RAMAN, NP  cyclobenzaprine  (FLEXERIL ) 10 MG tablet Take 1 tablet (10 mg total) by mouth 3 (three) times daily as needed for muscle spasms. 08/28/23   Johnny Garnette LABOR, MD  fluticasone  (FLONASE ) 50 MCG/ACT nasal spray Place 2 sprays into both nostrils daily as needed for allergies.    [provider]  folic acid  (FOLVITE ) 1 MG tablet Take 2 tablets (2 mg total) by mouth daily. 07/22/23   Dolphus Reiter, MD  levothyroxine   (SYNTHROID ) 100 MCG tablet TAKE 1 TABLET BY MOUTH EVERY DAY 12/09/23   Burchette, Wolm ORN, MD  methotrexate  (RHEUMATREX) 2.5 MG tablet Take 5 tablets (12.5 mg total) by mouth once a week. 11/30/23   Cheryl Waddell HERO, PA-C  metoprolol  tartrate (LOPRESSOR ) 25 MG tablet Take 1 tablet (25 mg total) by mouth 2 (two) times daily. 12/18/23   Rosario Leatrice FERNS, MD  nitroGLYCERIN  (NITROSTAT ) 0.4 MG SL tablet Place 1 tablet (0.4 mg total) under the tongue every 5 (five) minutes as needed for chest pain. 12/18/23   Rosario Leatrice FERNS, MD  senna-docusate (SENOKOT-S) 8.6-50 MG tablet Take 1 tablet by mouth at bedtime as needed for mild constipation. 12/18/23   Rosario Leatrice FERNS, MD    Physical Exam:  Vitals:   01/11/24 1810 01/11/24 2316 01/12/24 0005 01/12/24 0009  BP:  126/64 (!) 143/57   Pulse:  82 78   Resp:      Temp:  99 F (37.2 C) 98.5 F (36.9 C)   TempSrc:  Oral Oral   SpO2:  100% 93%   Weight: 64.6 kg   66.8 kg  Height: 5' 4 (1.626 m)   5' 4 (1.626 m)   Constitutional: Resting supine in bed, appears fatigued.  NAD, calm, comfortable Eyes: EOMI, lids and conjunctivae normal ENMT: Mucous membranes are moist. Posterior pharynx clear of any exudate or lesions.Normal dentition.  Neck: normal, supple, no masses. Respiratory: clear to auscultation bilaterally, no wheezing, no crackles. Normal respiratory effort. No accessory muscle use.  Cardiovascular: Regular rate and rhythm, no murmurs / rubs / gallops. No extremity edema. 2+ pedal pulses. Abdomen: no tenderness, no masses palpated. Musculoskeletal: no clubbing / cyanosis. No joint deformity upper and lower extremities. Good ROM, no contractures. Normal muscle tone.  Skin: no rashes, lesions, ulcers. No induration Neurologic: Sensation intact. Strength 5/5 in all 4.  Psychiatric: Normal judgment and insight. Alert and oriented x 3. Normal mood.   EKG: Personally reviewed. Sinus rhythm, rate 72, bifascicular block, PACs in bigeminy  pattern.  PACs new when compared to previous.  Assessment/Plan Principal Problem:   SIRS (systemic inflammatory response syndrome) (HCC) Active Problems:   Hypothyroidism   Hypertension   Sjogren's disease   MGUS (monoclonal gammopathy of unknown significance)   Pleural effusion   Paroxysmal atrial fibrillation (HCC)   Christy Moore is a 79 y.o. female with medical history significant for PAF on Eliquis  and amiodarone , bifascicular block, Sjogren syndrome, rheumatoid arthritis, mild ILD with bronchiectasis, HTN, hypothyroidism, esophageal stricture s/p dilation, MGUS who is admitted with SIRS/fever.  Assessment and Plan: SIRS/fever: Recently admitted with similar presentation.  Infectious workup was negative.  Meets SIRS criteria again with leukocytosis and fever.  Patient reports primary symptoms of migrating chest wall pain.  Again no obvious infectious source.  Differential includes inflammatory/autoimmune process or occult malignancy.  She was given empiric ceftriaxone  in the ED. - Hold further antibiotics for now - Obtain blood cultures and UA - Check ESR, CRP, procalcitonin - Obtain full respiratory viral panel - Check TSH since she has recently been started on amiodarone   History of dysphagia/esophageal dysmotility/early satiety/GERD: Ongoing issue with recent 5 pound weight loss per patient.  Seen by GI on last admission and was planned to undergo EGD however procedure was canceled due to new onset of A-fib/flutter with RVR. Previous EGD 08/2020 showed gastritis.  She has listed intolerances to oral PPI.  Will start on oral Pepcid .  Small bilateral pleural effusions: Noted on CTA chest, appears too small for thoracentesis.  Associated atelectasis noted.  TTE on 12/16/2023 showed EF 50-55% and severely elevated PA systolic pressure.  No peripheral edema.  Paroxysmal atrial fibrillation: Developed new onset A-fib/flutter during recent admission which was felt triggered by sepsis  although there was no obvious infectious process identified.  Of note she converted to sinus rhythm with use of metoprolol  however did have a 17-second pause at the time.  She remains in sinus rhythm with controlled rate. - Continue Lopressor  25 mg twice daily - Continue amiodarone  100 mg daily - Continue Eliquis  5 mg twice daily  Mild ILD with bronchiectasis: Follows with pulmonology Dr. Kara.  Does not appear to have acute decompensation.  Continue incentive spirometer, flutter valve, Mucinex .  Hypertension: Continue Lopressor .  Hypothyroidism: Continue Synthroid .  Check TSH.  Sjogren syndrome/rheumatoid arthritis: Follows with rheumatology Dr. Dolphus.  On methotrexate  weekly, will hold.  Patient has not had any joint pain or swelling.  MGUS: Follows with oncology Dr. Sherrod.   DVT prophylaxis:  apixaban  (ELIQUIS ) tablet 5 mg   Code Status: Full code, confirmed with patient on admission Family Communication: Discussed with patient, she has discussed with family Disposition Plan: From home and likely discharge to home pending clinical progress Consults called: None Severity of Illness: The appropriate patient status for this patient is OBSERVATION. Observation status is judged to be reasonable and necessary in order to provide the required intensity of service to ensure the patient's safety. The patient's presenting symptoms, physical exam findings, and initial radiographic and laboratory data in the context of their medical condition is felt to place them at decreased risk for further clinical deterioration. Furthermore, it is anticipated that the patient will be medically stable for discharge from the hospital within 2 midnights of admission.   Jorie Blanch MD [facility]  If 7PM-7AM, please contact night-coverage [email or web address]  01/12/2024, 1:15 AM      [1]  Social History Tobacco Use   Smoking status: Never    Passive exposure: Past   Smokeless tobacco: Never    Tobacco comments:    Never smoked 12/28/23  Vaping Use   Vaping status: Never Used  Substance Use Topics   Alcohol use: Not Currently    Comment: rarely   Drug use: No  [2]  Allergies Allergen Reactions   Protonix  [Pantoprazole ] Other (See Comments)    Terrible stomach cramps   Albuterol -Budesonide Other (See Comments)    Burned her tongue  albuterol  / budesonide   Amoxicillin -Pot Clavulanate     REACTION: nausea   Azithromycin      REACTION: nausea, Gi  Other Reaction(s): Unknown   Broccoli [Brassica Oleracea]     RAW only    Celecoxib Other (See Comments)    REACTION: nausea  celecoxib   Ciprofloxacin Other (See Comments)    REACTION: nausea  ciprofloxacin   Doxycycline  Other (See Comments)    REACTION: gi upset   Erythromycin Other (See Comments)    REACTION: nause, GI   Hydrocodone Other (See Comments)    REACTION: nausea   Hydrocodone-Acetaminophen      REACTION: GI upset   Levofloxacin Other (See Comments)    REACTION: she claims renal failiure  levofloxacin   Omeprazole  Nausea And Vomiting and Other (See Comments)    omeprazole    Penicillins    Propoxyphene N-Acetaminophen     Rofecoxib     REACTION: nausea   Sulfa Antibiotics    Tetanus Toxoid    "

## 2024-01-12 NOTE — Assessment & Plan Note (Addendum)
 01/12/2024 patient with continued right sided pleuritic chest pain.  She has small bilateral pleural effusions left greater than right.  Cannot give her IV Toradol due to continued systemic anticoagulation with Eliquis  for her A-fib.  1/8>>10/26 - pt has not required pain medication. Steroids seem to be helping. Pt able to take deep breaths again, no pain.  --Added lidocaine  patch --Continue oral morphine  7.5 mg p.o. every 4 hours as needed moderate or severe pain.   --Continue IV steroids to help with the pleuritic chest pain - Solu-Medrol  40 >> reduce to 20 mg daily.   --Due to small b/l pleural effusion, given IV Lasix  20 mg daily -- will stop now that pt weaned to room air

## 2024-01-12 NOTE — Plan of Care (Signed)

## 2024-01-12 NOTE — Subjective & Objective (Addendum)
 Pt seen and examined. Met with pt and her husband Christy Moore at bedside. Introduced [facility] us. Explained benefits and program requirements. Pt and husband are eager to get home and are agreeable with program requirements.  No smokers at home. Husband able to contain/crate 1 dog. Firearms are in gun safe. They have running water and electricity. They do not want meals. They do want TOC meds.  While patient was on IV and p.o. antibiotics in December, she did feel better.  She received about 4 days worth of IV Rocephin (12-9 to 12-12) and then about 3 days of p.o. Omnicef  at discharge.  She is immunosuppressed with methotrexate  that she takes for her Sjogren's and seronegative rheumatoid arthritis.  About a week after she stopped antibiotics, she started feeling worse again.  She has not been on steroids recently.  She has had increasing pleuritic chest pain.  She has not had any fevers.  Her CTA of her chest was negative for PE.  She did have bilateral pleural effusions with compressive atelectasis of the lower lobes.  Bedside thoracic ultrasound was positive for small pleural effusions and lung consolidation.  She continues to have right sided pleuritic chest pain.  Her serum inflammatory markers are quite elevated with a sed rate of 83 and a CRP of 23.4.  Her bacterial workup is negative but again she is immunosuppressed.  I think she fits sepsis criteria because of failed outpatient treatment of pneumonia.  She has been restarted on IV Rocephin .  I think starting her on some IV steroids may help her inflammatory process.  Discussed the case with cardiology with seen the patient at the end of December.  They want to continue amiodarone  and Eliquis  for now.  Patient cannot receive Toradol due to systemic anticoagulation.  Will give her 1 dose of morphine  due to pleuritic chest pain.  Patient requesting p.o. morphine  as the morphine  did help.  She normally does not take narcotics at  home.  Patient and her husband are eager to participate with hospital at [facility].  Written consent obtained.  Patient is ready passed PT and OT.

## 2024-01-12 NOTE — Progress Notes (Signed)
" °   01/12/24 0849  TOC Brief Assessment  Insurance and Status Reviewed  Patient has primary care physician Yes  Home environment has been reviewed From home  Prior level of function: Independent  Prior/Current Home Services No current home services  Social Drivers of Health Review SDOH reviewed no interventions necessary  Readmission risk has been reviewed Yes  Transition of care needs no transition of care needs at this time    "

## 2024-01-12 NOTE — Assessment & Plan Note (Addendum)
 01/12/2024 treated as an outpatient with methotrexate  7.5 mg twice a day on Sundays.  I think part of her pulmonary process may be a flare of her Sjogren's.   1/7-->10/26 --IV Solu-Medrol  40 mg once daily for pleuritic chest pain  --Resume PO prednisone  5 mg daily on discharge

## 2024-01-12 NOTE — Evaluation (Signed)
 Occupational Therapy Evaluation Patient Details Name: Christy Moore MRN: [id number] DOB: Sep 29, 1945 Today's Date: 01/12/2024   History of Present Illness   79  yr old female who presented to the hospital, due to atypical angina and hypoxia and one episode of RVR. Pt has met SIRS criteria with fever and negative for infectious workup. Pt recently admitted to hospital from 12/15/2023-12/18/2023. PMH: PAF, bifascicular block, Sjogrens syndrome, RA, ILD, HTN, hypothyroidism, esophageal stricture, MGUS, arthritis, GERD, depression, and seizures.     Clinical Impressions During the session today, the pt performed all assessed tasks with distant supervision or better, including sit to stand, lower body dressing, ambulation into the hall without an assistive device and upper body grooming in standing at sink level. She reported ongoing 4/10 R flank and mid back pain; she stated the pain has been ongoing for the past couple weeks. She also reported difficulty with taking deep breaths, due to increased pain when trying to do so. She was instructed on implementing pursed lip breathing exercises during activity, due to feelings of shortness of breath. OT also provided introductory education on implementing energy conservation strategies as needed during household ADLs and IADLs; a relevant educational handout was provided. All needed OT education and/or intervention was provided during the session and she does not require further OT services. OT will sign off and recommend she return home at discharge.      If plan is discharge home, recommend the following:   Assistance with cooking/housework     Functional Status Assessment   Patient has not had a recent decline in their functional status     Equipment Recommendations   None recommended by OT     Recommendations for Other Services         Precautions/Restrictions   Restrictions Weight Bearing Restrictions Per Provider Order: No      Mobility Bed Mobility Overal bed mobility: Modified Independent      Transfers Overall transfer level: Independent Equipment used: None       Balance Overall balance assessment: Mild deficits observed, not formally tested       ADL either performed or assessed with clinical judgement   ADL      General ADL Comments: During the session today, she performed all assessed ADLs with distant supervision or better including lower body dressing/doffing her socks seated EOB and upper body grooming standing at the sink.     Vision   Additional Comments: She correctly read the time depicted on the wall clock.            Pertinent Vitals/Pain Pain Assessment Pain Assessment: 0-10 Pain Score: 4  Pain Location: R flank, back, chest and rib Pain Descriptors / Indicators: Aching, Constant, Guarding, Sore Pain Intervention(s): Limited activity within patient's tolerance, Monitored during session, Repositioned     Extremity/Trunk Assessment Upper Extremity Assessment Upper Extremity Assessment: Overall WFL for tasks assessed;Right hand dominant   Lower Extremity Assessment Lower Extremity Assessment: Overall WFL for tasks assessed   Cervical / Trunk Assessment Cervical / Trunk Assessment: Normal   Communication Communication Communication: No apparent difficulties   Cognition Arousal: Alert Behavior During Therapy: WFL for tasks assessed/performed Cognition: No apparent impairments             OT - Cognition Comments: Oriented x4            Following commands: Intact       Cueing  General Comments   Cueing Techniques: Verbal cues  Home Living Family/patient expects to be discharged to:: Private residence Living Arrangements: Spouse/significant other Available Help at Discharge: Family Type of Home: House Home Access: Stairs to enter Secretary/administrator of Steps: 3 Entrance Stairs-Rails: Left Home Layout: One level     Bathroom  Shower/Tub: Tub/shower unit         Home Equipment: Cane - single point;Shower seat          Prior Functioning/Environment Prior Level of Function : Independent/Modified Independent             Mobility Comments:  (Independent with ambulation.) ADLs Comments: Independent with ADLs, cooking, cleaning, and driving.    OT Problem List: Pain   OT Treatment/Interventions:   No further treatment needs identified      OT Goals(Current goals can be found in the care plan section)   Acute Rehab OT Goals OT Goal Formulation: All assessment and education complete, DC therapy   OT Frequency:   N/A       AM-PAC OT 6 Clicks Daily Activity     Outcome Measure Help from another person eating meals?: None Help from another person taking care of personal grooming?: None Help from another person toileting, which includes using toliet, bedpan, or urinal?: None Help from another person bathing (including washing, rinsing, drying)?: A Little Help from another person to put on and taking off regular upper body clothing?: None Help from another person to put on and taking off regular lower body clothing?: None 6 Click Score: 23   End of Session Equipment Utilized During Treatment: Oxygen Nurse Communication: Mobility status  Activity Tolerance: Patient limited by pain Patient left: in bed;with call bell/phone within reach;with family/visitor present  OT Visit Diagnosis: Pain Pain - part of body:  (R flank and back)                Time: 8974-8882 OT Time Calculation (min): 52 min Charges:  OT General Charges $OT Visit: 1 Visit OT Evaluation $OT Eval Low Complexity: 1 Low OT Treatments $Therapeutic Activity: 8-22 mins    Christy Moore, OTR/L 01/12/2024, 12:54 PM

## 2024-01-12 NOTE — Progress Notes (Signed)
 This clinical research associate went to [facility]'s nursing station to obtain patients shadow chart for room 1617. Spoke to the diplomatic services operational officer about not discharging patient from the system and that she was transferring to [facility] program. This clinical research associate and EMT, Garrel went to patients room, 1617, knocked on the patients door, Nurse, Isaiah answered and asked to wait a moment, she was dressing the patient. This clinical research associate waited and when permitted to enter, this clinical research associate introduced myself and Garrel to the patient. This clinical research associate obtained patients vital signs: T-98.9, P-100, R-18, BP-136/87, SPO2-94% on room air. This clinical research associate asked the nurse about the patient being on O2, she stated she had been without it and was comfortable without it, no SOB. This writer explained to the patient if she felt short of breath while transporting to let us  know, we had portable O2 machine with us . Patient stood and pivoted and sat in the wheelchair with no problems. Patient was pushed to the wheelchair van. The patient was secured via 2 front floor hooks, 2 rear floor hooks, 2 seatbelt locks and lap/shoulder seatbelt. Patient was transported home.   Patient arrived home safely, patient was pushed in her wheelchair to her front steps, patient stood and walked into the house and sat on her couch. This clinical research associate took patients O2 stats, 92-90% on room air. The patient was not complaining of SOB, this writer initiated O2 at 3 LPM. This clinical research associate rechecked patients O2 stat and it was registering at 97%. The patients vitals were obtained and documented into EPIC. This clinical research associate spoke with the Acupuncturist, Edsel about this, and she concurred with the measures put into place.  EMT, Garrel set up [facility] equipment, this writer explained to the patient and her husband how to use the tablet and if they had any questions to not hesitate to reach out to the nurse. This clinical research associate advised Paramedic, Delon would be stopping by later to drop off her  medications and finish her assessment. The patient and her husband were thankful for the program and the staff being so helpful! This clinical research associate and EMT, Garrel left the patients house. ------------------------------------------------------------------------------------------------------------- Nat SQUIBB. Franchot, EMT

## 2024-01-12 NOTE — Progress Notes (Addendum)
 " PROGRESS NOTE    Christy Moore  FMW:[id number] DOB: 1945-07-16 DOA: 01/11/2024 PCP: Christy Wolm LELON, MD  Subjective: Pt seen and examined. Met with pt and her husband Christy Moore at bedside. Introduced [facility] us. Explained benefits and program requirements. Pt and husband are eager to get home and are agreeable with program requirements.  No smokers at home. Husband able to contain/crate 1 dog. Firearms are in gun safe. They have running water and electricity. They do not want meals. They do want TOC meds.  While patient was on IV and p.o. antibiotics in December, she did feel better.  She received about 4 days worth of IV Rocephin (12-9 to 12-12) and then about 3 days of p.o. Omnicef  at discharge.  She is immunosuppressed with methotrexate  that she takes for her Sjogren's and seronegative rheumatoid arthritis.  About a week after she stopped antibiotics, she started feeling worse again.  She has not been on steroids recently.  She has had increasing pleuritic chest pain.  She has not had any fevers.  Her CTA of her chest was negative for PE.  She did have bilateral pleural effusions with compressive atelectasis of the lower lobes.  Bedside thoracic ultrasound was positive for small pleural effusions and lung consolidation.  She continues to have right sided pleuritic chest pain.  Her serum inflammatory markers are quite elevated with a sed rate of 83 and a CRP of 23.4.  Her bacterial workup is negative but again she is immunosuppressed.  I think she fits sepsis criteria because of failed outpatient treatment of pneumonia.  She has been restarted on IV Rocephin .  I think starting her on some IV steroids may help her inflammatory process.  Discussed the case with cardiology with seen the patient at the end of December.  They want to continue amiodarone  and Eliquis  for now.  Patient cannot receive Toradol due to systemic anticoagulation.  Will give her 1 dose of morphine  due to  pleuritic chest pain.  Patient requesting p.o. morphine  as the morphine  did help.  She normally does not take narcotics at home.  Patient and her husband are eager to participate with hospital at home.  Written consent obtained.  Patient is ready passed PT and OT.    Hospital Course: CC: chest pain for 1.5 months HPI: Christy Moore is a 79 y.o. female with medical history significant for PAF on Eliquis  and amiodarone , bifascicular block, Sjogren syndrome, rheumatoid arthritis, mild ILD with bronchiectasis, HTN, hypothyroidism, esophageal stricture s/p dilation, MGUS who presented to the ED for evaluation of chest pain.   Patient recently admitted from 12/15/2023-12/18/2023.  Initially presented with atypical chest pain and mild hypoxia.  She met SIRS criteria.  She had negative infectious workup.  During admission she went to atrial flutter with RVR.  She was given metoprolol  and converted back to sinus rhythm but did have a 17-second pause.  She was seen by cardiology.  It was felt that A-fib/flutter was triggered by infection.  She has been started on Eliquis , metoprolol , and amiodarone .   Patient returns with musculoskeletal pain throughout her thorax.  She says pain is currently on the right side of her chest and feels like muscle soreness.  She says mostly the pain has been from her hips up to her mid back.  She is also having pain above her right clavicle.  She has chronic respiratory issues with bronchiectasis and states that she has never been able to take deep breaths.  She  reports appetite has been low for several weeks and she has had an unintentional 5 pound weight loss.  She reports early satiety without dysphagia.   She has had some shaking chills without diaphoresis.  She has not really had a cough.  She had an episode of nausea and vomiting on arrival to the floor but no other recent episodes.  She has not had abdominal pain.  She reports good urine output without dysuria.  She has not  had any diarrhea.  She has not had any swelling in her lower extremities.  She denies any skin changes or rash.  No obvious bug, tick, insect bites.   She follows with rheumatology, Christy Moore, for Sjogren syndrome and seronegative rheumatoid arthritis and has been on methotrexate  which she takes every Sunday.  Patient states she has not had any issues with joint pain or swelling.   [facility] ED Course  Labs/Imaging on admission: I have personally reviewed following labs and imaging studies.   Initial vitals showed BP 158/73, pulse 86, RR 20, temp 98.8 F, SpO2 99% on room air.  Tmax 101.2 F rectally.   Labs showed WBC 12.8, hemoglobin 12.6, platelets 356, sodium 136, potassium 4.2, bicarb 26, BUN 10, creatinine 0.87, serum glucose 178, troponin T <15 x 2, lactic acid 1.0.   SARS-CoV-2, influenza, RSV PCR negative.  Blood cultures and urinalysis ordered and pending collection.   CTA chest negative for PE.  New small bilateral pleural effusions with associated passive atelectasis in the lower lobes seen without pulmonary infarction, edema, or infection.  Mild pericardial thickening noted.   Patient was given IV morphine  4 mg x 2, IV Dilaudid  1 mg, 1 L LR, and IV ceftriaxone .  The hospitalist service was consulted for admission.  Significant Events: Admitted 01/11/2024 for SIRS/fever   Admission Labs: WBC 12.8, HgB 12.6, Plt 356 Na 136, K 4.2, CO2 of 26, BUN 10, Scr 0.87, glu 178 Lactic acid 1.0 Covid/Flu/RSV negative UA negative RVP negative TSH 1.160 ProBNP 1011 Procalcitonin 0.23 ESR 83 CRP 23.4  Admission Imaging Studies: CXR Decreased small left pleural effusion with adjacent airspace disease, favoring atelectasis. 2. Cardiomegaly with trace right pleural fluid. 3. Aortic atherosclerosis (ICD10-I70.0). CTPA No evidence of pulmonary embolism. 2. New small bilateral pleural effusions with associated passive atelectasis in the lower lobes, without pulmonary  infarction, edema, or infection. 3. Mild pericardial thickening.  Significant Labs:   Significant Imaging Studies:   Antibiotic Therapy: Anti-infectives (From admission, onward)    Start     Dose/Rate Route Frequency Ordered Stop   01/12/24 1830  cefTRIAXone  (ROCEPHIN ) 2 g in sodium chloride  0.9 % 100 mL IVPB        2 g 200 mL/hr over 30 Minutes Intravenous Every 24 hours 01/12/24 1437 01/16/24 1829   01/12/24 1800  cefTRIAXone  (ROCEPHIN ) 2 g in sodium chloride  0.9 % 100 mL IVPB  Status:  Discontinued        2 g 200 mL/hr over 30 Minutes Intravenous Every 24 hours 01/12/24 1014 01/12/24 1437   01/11/24 1915  cefTRIAXone  (ROCEPHIN ) 2 g in sodium chloride  0.9 % 100 mL IVPB        2 g 200 mL/hr over 30 Minutes Intravenous  Once 01/11/24 1903 01/11/24 1942       Procedures:   Consultants:     Assessment and Plan: * Sepsis due to pneumonia (HCC) 01/12/2024 she meets sepsis criteria on admission.White count 12.8, 84% room air saturations, fever 101.2.  Due to pneumonia.  acute respiratory with hypoxia as the acute organ dysfunction.  Immunosuppression due to drug therapy 01/12/2024 she is immunosuppressed on methotrexate  that she takes for her Sjogren's and seronegative rheumatoid arthritis.  Continue to hold methotrexate .  This may be the reason why she failed outpatient treatment with a short course of p.o. antibiotics.  She was better when she was in the hospital and IV Rocephin .   Pleurisy with effusion 01/12/2024 patient with continued right sided pleuritic chest pain.  She has small bilateral pleural effusions left greater than right.  Cannot give her IV Toradol due to continued systemic anticoagulation with Eliquis  for her A-fib.  Patient states that IV morphine  did help her.  She understands that IV opiates cannot be given with hospital at home program.  Can give her 1 dose of IV morphine  before enrollment to hospital at [facility].  Will continue with p.o. morphine  as needed.  7.5 mg  p.o. every 4 hours as needed moderate or severe pain.  I think IV steroids can be initiated to help with the pleuritic chest pain.  Will start with Solu-Medrol  40 mg daily.  She does have a small bilateral pleural effusions.  She does have an elevated proBNP.  I think once a day daily IV Lasix  20 mg over the next several days should help with diuresis.   Community acquired bilateral lower lobe pneumonia 01/12/2024 patient did get treatment with IV Rocephin  and p.o. Omnicef  while she was admitted to the hospital in December.  I think due to her immunosuppressed status, the 7 days of antibiotics she received or probably not enough.  I think she will need a full week of IV Rocephin .  She has listed allergies to Zithromax  which causes nausea and doxycycline  which causes GI upset.  Asking pharmacist if Biaxin may be an alternative.  Will check Legionella and strep pneumo urinary antigens for completeness sake.  As needed albuterol  nebs. Day #2 of 7 of IV Rocephin . Day #1 of 3 of IV Zithromax (pt has GI/N/V with PO Zithromax ).  Seronegative rheumatoid arthritis (HCC) 01/12/2024 currently she does not have any swelling of her joints.  She thinks that methotrexate  has helped her tremendously since starting it several months ago.   Paroxysmal atrial fibrillation (HCC) 01/12/2024 she remains in normal sinus rhythm by EKG on admission.  She is on Eliquis  5 mg twice daily.  Remains on amiodarone  200 mg daily.  She is on Lopressor  25 mg twice daily.   Acute respiratory failure with hypoxia (HCC) 01/12/2024 patient admitted with room air saturations of 84%.  She has small bilateral pleural effusions and a history of seronegative rheumatoid arthritis and Sjogren syndrome.  She sees Dr. Kara with pulmonary critical care as an outpatient.  She does not normally use home oxygen.  Continue with 2 L a minute for now.  Hopefully diuresis, IV steroids, treatment of her pneumonia will resolve her acute hypoxic respiratory  failure.   Sjogren's disease 01/12/2024 treated as an outpatient with methotrexate  7.5 mg twice a day on Sundays.  I think part of her pulmonary process may be a flare of her Sjogren's.  Start IV Solu-Medrol  40 mg once daily to see if we can get her pleuritic chest pain to improve.   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  She been placed on IV Lasix  20 mg daily.  Can hold the Norvasc  for now.   Acquired hypothyroidism 01/12/2024 stable.  Continue Synthroid   100 mcg daily.  Normal TSH.  Of 1.16    DVT prophylaxis:  apixaban  (ELIQUIS ) tablet 5 mg     Code Status: Full Code Family Communication: discussed with pt and husband Lee at bedside Disposition Plan: home Reason for continuing need for hospitalization: on IV Rocephin , IV lasix  and IV solumedrol  Objective: Vitals:   01/12/24 1012 01/12/24 1224 01/12/24 1456 01/12/24 1837  BP: (!) 118/56 (!) 125/54 (!) 146/60 136/75  Pulse: 74 70 86 89  Resp:  18 18 18   Temp:  99 F (37.2 C) 99.7 F (37.6 C) 98.6 F (37 C)  TempSrc:  Oral Oral Oral  SpO2:  98% 94% (P) 97%  Weight:    66.8 kg  Height:    5' 4.5 (1.638 m)    Intake/Output Summary (Last 24 hours) at 01/12/2024 2000 Last data filed at 01/12/2024 1758 Gross per 24 hour  Intake 590 ml  Output 1050 ml  Net -460 ml   Filed Weights   01/11/24 1810 01/12/24 0009 01/12/24 1837  Weight: 64.6 kg 66.8 kg 66.8 kg    Examination:  Physical Exam Vitals and nursing note reviewed.  Constitutional:      General: She is not in acute distress.    Appearance: She is normal weight. She is not toxic-appearing or diaphoretic.  HENT:     Head: Normocephalic and atraumatic.     Nose: Nose normal.  Eyes:     General: No scleral icterus. Cardiovascular:     Rate and Rhythm: Normal rate and regular rhythm.  Pulmonary:     Effort: Pulmonary effort is normal.     Breath sounds: No  wheezing.     Comments: Bedside thoracic U/S shows small bilateral pleural effusion. Less than 250 ml on either side. Consolidation of bilateral lower lobes. No loculated effusions noted. Abdominal:     General: Abdomen is flat. Bowel sounds are normal. There is no distension.     Palpations: Abdomen is soft.  Musculoskeletal:     Right lower leg: No edema.     Left lower leg: No edema.  Skin:    General: Skin is warm and dry.     Capillary Refill: Capillary refill takes less than 2 seconds.  Neurological:     Mental Status: She is alert and oriented to person, place, and time.     Data Reviewed: I have personally reviewed following labs and imaging studies  CBC: Recent Labs  Lab 01/11/24 1140 01/12/24 0604  WBC 12.8* 14.1*  NEUTROABS  --  13.0*  HGB 12.6 11.6*  HCT 38.7 35.7*  MCV 93.3 94.7  PLT 356 291   Basic Metabolic Panel: Recent Labs  Lab 01/11/24 1140 01/12/24 0604  NA 136 136  K 4.2 4.2  CL 99 101  CO2 26 25  GLUCOSE 178* 104*  BUN 10 7*  CREATININE 0.87 0.71  CALCIUM 9.7 9.1   GFR: Estimated Creatinine Clearance: 52.8 mL/min (by C-G formula based on SCr of 0.71 mg/dL). ProBNP, BNP (last 5 results) Recent Labs    01/12/24 0604  PROBNP 1,011.0*   Thyroid  Function Tests: Recent Labs    01/12/24 0604  TSH 1.160   Sepsis Labs: Recent Labs  Lab 01/11/24 1735 01/12/24 0034 01/12/24 0604  PROCALCITON  --   --  0.23  LATICACIDVEN 1.0 1.9  --     Recent Results (from the past 240 hours)  Resp panel by RT-PCR (RSV, Flu A&B, Covid) Anterior Nasal Swab  Status: None   Collection Time: 01/11/24  7:22 PM   Specimen: Anterior Nasal Swab  Result Value Ref Range Status   SARS Coronavirus 2 by RT PCR NEGATIVE NEGATIVE Final    Comment: (NOTE) SARS-CoV-2 target nucleic acids are NOT DETECTED.  The SARS-CoV-2 RNA is generally detectable in upper respiratory specimens during the acute phase of infection. The lowest concentration of SARS-CoV-2  viral copies this assay can detect is 138 copies/mL. A negative result does not preclude SARS-Cov-2 infection and should not be used as the sole basis for treatment or other patient management decisions. A negative result may occur with  improper specimen collection/handling, submission of specimen other than nasopharyngeal swab, presence of viral mutation(s) within the areas targeted by this assay, and inadequate number of viral copies(<138 copies/mL). A negative result must be combined with clinical observations, patient history, and epidemiological information. The expected result is Negative.  Fact Sheet for Patients:  bloggercourse.com  Fact Sheet for Healthcare Providers:  seriousbroker.it  This test is no t yet approved or cleared by the [place]  FDA and  has been authorized for detection and/or diagnosis of SARS-CoV-2 by FDA under an Emergency Use Authorization (EUA). This EUA will remain  in effect (meaning this test can be used) for the duration of the COVID-19 declaration under Section 564(b)(1) of the Act, 21 U.S.C.section 360bbb-3(b)(1), unless the authorization is terminated  or revoked sooner.       Influenza A by PCR NEGATIVE NEGATIVE Final   Influenza B by PCR NEGATIVE NEGATIVE Final    Comment: (NOTE) The Xpert Xpress SARS-CoV-2/FLU/RSV plus assay is intended as an aid in the diagnosis of influenza from Nasopharyngeal swab specimens and should not be used as a sole basis for treatment. Nasal washings and aspirates are unacceptable for Xpert Xpress SARS-CoV-2/FLU/RSV testing.  Fact Sheet for Patients: bloggercourse.com  Fact Sheet for Healthcare Providers: seriousbroker.it  This test is not yet approved or cleared by the [place]  FDA and has been authorized for detection and/or diagnosis of SARS-CoV-2 by FDA under an Emergency Use Authorization  (EUA). This EUA will remain in effect (meaning this test can be used) for the duration of the COVID-19 declaration under Section 564(b)(1) of the Act, 21 U.S.C. section 360bbb-3(b)(1), unless the authorization is terminated or revoked.     Resp Syncytial Virus by PCR NEGATIVE NEGATIVE Final    Comment: (NOTE) Fact Sheet for Patients: bloggercourse.com  Fact Sheet for Healthcare Providers: seriousbroker.it  This test is not yet approved or cleared by the [place]  FDA and has been authorized for detection and/or diagnosis of SARS-CoV-2 by FDA under an Emergency Use Authorization (EUA). This EUA will remain in effect (meaning this test can be used) for the duration of the COVID-19 declaration under Section 564(b)(1) of the Act, 21 U.S.C. section 360bbb-3(b)(1), unless the authorization is terminated or revoked.  Performed at [facility], [address] [place], [place] [address]   Culture, blood (Routine X 2) w Reflex to ID Panel     Status: None (Preliminary result)   Collection Time: 01/12/24 12:34 AM   Specimen: BLOOD RIGHT ARM  Result Value Ref Range Status   Specimen Description   Final    BLOOD RIGHT ARM Performed at [facility], [address] [place], [place] [address]    Special Requests   Final    BOTTLES DRAWN AEROBIC ONLY Blood Culture results may not be optimal due to an inadequate volume of blood received in culture  bottles Performed at [facility], [address] [place], [place] [address]    Culture PENDING  Incomplete   Report Status PENDING  Incomplete  Culture, blood (Routine X 2) w Reflex to ID Panel     Status: None (Preliminary result)   Collection Time: 01/12/24 12:34 AM   Specimen: BLOOD RIGHT HAND  Result Value Ref Range Status   Specimen Description   Final    BLOOD RIGHT HAND Performed at [facility], [address], [place], [place]  [address]    Special Requests   Final    BOTTLES DRAWN AEROBIC ONLY Blood Culture results may not be optimal due to an inadequate volume of blood received in culture bottles Performed at [facility], [address], [place], [place] [address]    Culture PENDING  Incomplete   Report Status PENDING  Incomplete  Respiratory (~20 pathogens) panel by PCR     Status: None   Collection Time: 01/12/24  1:38 AM   Specimen: Nasopharyngeal Swab; Respiratory  Result Value Ref Range Status   Adenovirus NOT DETECTED NOT DETECTED Final   Coronavirus 229E NOT DETECTED NOT DETECTED Final    Comment: (NOTE) The Coronavirus on the Respiratory Panel, DOES NOT test for the novel  Coronavirus (2019 nCoV)    Coronavirus HKU1 NOT DETECTED NOT DETECTED Final   Coronavirus NL63 NOT DETECTED NOT DETECTED Final   Coronavirus OC43 NOT DETECTED NOT DETECTED Final   Metapneumovirus NOT DETECTED NOT DETECTED Final   Rhinovirus / Enterovirus NOT DETECTED NOT DETECTED Final   Influenza A NOT DETECTED NOT DETECTED Final   Influenza B NOT DETECTED NOT DETECTED Final   Parainfluenza Virus 1 NOT DETECTED NOT DETECTED Final   Parainfluenza Virus 2 NOT DETECTED NOT DETECTED Final   Parainfluenza Virus 3 NOT DETECTED NOT DETECTED Final   Parainfluenza Virus 4 NOT DETECTED NOT DETECTED Final   Respiratory Syncytial Virus NOT DETECTED NOT DETECTED Final   Bordetella pertussis NOT DETECTED NOT DETECTED Final   Bordetella Parapertussis NOT DETECTED NOT DETECTED Final   Chlamydophila pneumoniae NOT DETECTED NOT DETECTED Final   Mycoplasma pneumoniae NOT DETECTED NOT DETECTED Final    Comment: Performed at [facility], [address] [place], [place] [address]     Radiology Studies: CT Angio Chest PE W and/or Wo Contrast Result Date: 01/11/2024 EXAM: CTA CHEST 01/11/2024 06:01:45 PM TECHNIQUE: CTA of the chest was performed after the administration of 75 mL of iohexol  (OMNIPAQUE ) 350 MG/ML injection.  Multiplanar reformatted images are provided for review. MIP images are provided for review. Automated exposure control, iterative reconstruction, and/or weight based adjustment of the mA/kV was utilized to reduce the radiation dose to as low as reasonably achievable. COMPARISON: CT chest 12/14/2000. CLINICAL HISTORY: Pulmonary embolism (PE) suspected, high prob. FINDINGS: PULMONARY ARTERIES: Pulmonary arteries are adequately opacified for evaluation. No acute pulmonary embolus. Main pulmonary artery is normal in caliber. MEDIASTINUM: Mild pericardial thickening. There is no acute abnormality of the thoracic aorta. LYMPH NODES: No mediastinal, hilar or axillary lymphadenopathy. LUNGS AND PLEURA: New bilateral small pleural effusions with associated passive atelectasis of the lower lobes. No pulmonary infarction. No pulmonary edema or infection identified. No pneumothorax. UPPER ABDOMEN: Limited images of the upper abdomen are unremarkable. SOFT TISSUES AND BONES: No acute bone or soft tissue abnormality. IMPRESSION: 1. No evidence of pulmonary embolism. 2. New small bilateral pleural effusions with associated passive atelectasis in the lower lobes, without pulmonary infarction, edema, or  infection. 3. Mild pericardial thickening. Electronically signed by: Norleen Boxer MD 01/11/2024 06:54 PM EST RP Workstation: [id number]   DG Chest 2 View Result Date: 01/11/2024 EXAM: 2 VIEW(S) XRAY OF THE CHEST 01/11/2024 12:23:48 PM COMPARISON: 12/17/2023 CLINICAL HISTORY: cp FINDINGS: LINES, TUBES AND DEVICES: EKG lead artifacts project over the upper lungs. LUNGS AND PLEURA: Improved left base airspace disease. Small left pleural effusion, minimally decreased. Trace right pleural fluid. No pneumothorax. HEART AND MEDIASTINUM: Mild cardiomegaly. Transverse aortic atherosclerosis. BONES AND SOFT TISSUES: No acute osseous abnormality. IMPRESSION: 1. Decreased small left pleural effusion with adjacent airspace disease, favoring  atelectasis. 2. Cardiomegaly with trace right pleural fluid. 3. Aortic atherosclerosis (ICD10-I70.0). Electronically signed by: Rockey Kilts MD 01/11/2024 01:11 PM EST RP Workstation: [id number]    Scheduled Meds:  amiodarone   200 mg Oral Daily   apixaban   5 mg Oral BID   famotidine   20 mg Oral Daily   folic acid   2 mg Oral Daily   [START ON 01/13/2024] furosemide   20 mg Intravenous Daily   levothyroxine   100 mcg Oral Daily   metoprolol  tartrate  25 mg Oral BID   sodium chloride  flush  3 mL Intravenous Q12H   Continuous Infusions:  azithromycin  500 mg (01/12/24 1642)   cefTRIAXone  (ROCEPHIN )  IV 2 g (01/12/24 1546)     LOS: 1 day   Time spent: 65 minutes  Camellia Door, DO  [facility]  01/12/2024, 8:00 PM   [facility] Admission Criteria Checklist:  Formal consent explained in detail and signed at the bedside: yes Patient meets inpatient admission criteria (see below for further details) yes Is pt Medicare FFS/[employer] Medicare-Medicaid, [employer], [employer] ( required for initial launch with plan to expand)? yes Lives within 25 mil/ 30 min from [facility] within [place](pt may stay with family member during admission who lives within 25 miles or 30 min from [facility] w[facility]in [facility])? yes Hemodynamically stable with relatively low risk of clinical deterioration-not requiring ICU? yes Age >55? yes Does not require frequent touch-points or complex interventions/medications (ie Titrated Infusions (IV insulin, heparin drips, vasoactive drips, use of infused or injectable controlled substances, patients on insulin)? yes Any Behavioral Health comorbidities likely to increase risk for in-home care (ie Acute delirium or experiencing a marked altered mental status and cause is not a treatable condition in the home)? no Has the patient been on BIPAP during course of ED evaluation or hospitalization? no IF YES, Has the patient been off of BIPAP for >24 hours(If NO-THEN PATIENT DOES  MEET INCLUSION CRITERIA)? not applicable On Room Air or Needs oxygen at home (<6L)? is not on home oxygen therapy. Active safety concerns (ie Unable to use bedside commode independently and lacks caregiver support for safety- needs SNF placement, unable to obtain IV access)? no Has skin check been performed? yes  Has Physical Therapy screened the patient? yes  Common admission diagnoses including: CAP, COPD Exacerbation, Acute on chronic heart failure, Cellulitis, UTI , dehydration, acute resp failure with hypoxia (requiring <5L)   Social Screening:  - Has the family been directly contacted about [facility] program with consent obtained (if yes- please document who was spoken to with name and phone number)? yes Paraskevi Funez [phone]  -Was the family approached about the use of TOC pharmacy for medications at discharge? yes Denies significant ETOH intake? yes Does not smoke and understands may not smoke in the presence of oxygen? yes Patient states able to use iPad/phone for communication/has family who is  able to use? yes Patient has agreed to be compliant with medication and treatment regimen of the program? yes Any active drug use in patient or primary caregiver including daily dosing of methadone? no Stable home environment ( access to appropriate heating in cold conditions and/or appropriate air conditioning in hot conditions and/or no running water/electricity)? yes  No aggressive pets at home? no Firearm present? yes  With ability or willingness to store them unloaded in a locked case for duration of hospitalization? yes Ambulatory? yes  no difficulty Bed bugs present on home evaluation? no Family support system in place? yes Patient feels safe at home and does not endorse any violence? yes Any actively decompensated behavioral health issues including agitation/aggressive behavior? no  Patient requests food to be provided by hospital home program? no PT/OT eval completed and not  requiring SNF, ALF, inpatient rehab? yes  To be admitted to the [facility] program, a patient generally must meet the following: 1. Requirement for Inpatient Level of Care: The patient's condition must necessitate an inpatient level of care. This is typically indicated by one or more of the following, depending on their specific diagnosis:  Persistent tachycardia despite appropriate treatment (e.g., for Heart Failure, UTI). Persistent tachypnea (rapid breathing) or dyspnea (shortness of breath) that hasn't improved sufficiently with observation care (e.g., for Heart Failure, Pneumonia, Viral Illness, COVID). Hypoxemia (low oxygen levels), such as a new need for oxygen, an increased need from baseline, or specific oxygen saturation levels (e.g., SpO2 <90-94% depending on the condition) that persist despite observation (e.g., for Heart Failure, COPD, Pneumonia, Viral Illness, COVID). Need for Intravenous (IV) hydration due to an inability to maintain oral hydration, which persists despite observation care (e.g., for Cellulitis, UTI, Viral Illness, COVID). Specific to Heart Failure: Persistent pulmonary edema, indicated by a new oxygen need, lack of improvement with IV diuretics, and ongoing tachypnea/dyspnea. Specific to COPD: A decrease in known baseline resting oxygen saturation (SpO2) by 4% or more, or an increase in pre-existing supplemental oxygen requirements, which persists despite observation and requires continued close monitoring. Specific to Pneumonia: A Pneumonia Severity Index (PSI) class IV (moderate risk). Specific to Cellulitis: Failure of outpatient antibiotic therapy (indicated by progression or no improvement after a minimum of 48 hours on an adequate regimen) or a clinical presentation (like acuity or rapidity of progression) that requires the intensity of monitoring found in an inpatient setting. Specific to UTI: Persistence or worsening of clinical findings like fever, pain, or  dehydration despite observation care; presence of significant uropathy; suspected infection of an indwelling prosthetic device, stent, implant, or graft; or pregnancy with suspected pyelonephritis.  2. Appropriateness for Hospital at Home Setting: The patient's overall clinical picture, including the severity of their illness, their care needs, and their medical history and comorbidities, must be suitable for management in the Hospital at Home environment. This essentially means that none of the exclusion criteria (listed below) are met.  Unified Exclusion Criteria for Hospital at Home Admission: A patient would not be eligible for Hospital at Home if any of the following are present: Hemodynamic Instability: Hypotension (low blood pressure) is present. Respiratory Instability or Needs Beyond Program Capability: There is a new need for invasive or noninvasive ventilatory assistance (like BiPAP or a ventilator). Oxygenation is not sufficient, generally indicated if an FiO2 (fraction of inspired oxygen) of 45% (which is about 6 Liters/minute via nasal cannula) or more is required to keep oxygen saturation (SpO2) at 90% or greater. Monitoring or Procedural Needs Beyond  Program Capability: There is a need for invasive monitoring, such as a pulmonary artery catheter or an arterial line. There is a need for immediate-response telemetry monitoring (for dangerous arrhythmia detection and subsequent immediate intervention). The required medication regimen is beyond the capabilities of Hospital at Home (e.g., dosing intervals are too frequent for home administration). There is a need for a procedure that cannot be performed by the [facility] team (e.g., significant wound debridement or abscess drainage for cellulitis, or percutaneous nephrostomy for a complicated UTI). Significant Organ Dysfunction or Markers of Severe Illness: Mental status is not at baseline, or there is altered mental status  suggestive of inadequate perfusion. Renal (kidney) function is unstable or showing an ongoing decline. There is evidence of inadequate perfusion, such as metabolic acidosis or myocardial ischemia. Uncompensated acidosis is present. Condition-Specific Severity or Complications Making Home Care Unsuitable: For Heart Failure: Known severe cardiac valvular disease (e.g., aortic stenosis, mitral regurgitation); or severe peripheral edema that impairs the ability to urinate or ambulate. For COPD: Known concurrent comorbidity or finding that indicates a higher-risk COPD exacerbation (e.g., pulmonary fibrosis, cavitation, pleural effusion, pneumothorax, rib fracture). For Pneumonia: Pneumonia Severity Index (PSI) class V (indicating high risk for inpatient mortality); known concurrent comorbidity or finding that indicates higher-risk pneumonia (e.g., pulmonary fibrosis, cavitation, large or loculated pleural effusion); or a concomitant serious infectious process like endocarditis or empyema. For Cellulitis: Orbital, periorbital, or necrotizing infection is suspected; or a concomitant serious infectious process like endocarditis, septic emboli, or septic joint space infection. For UTI: Urinary tract obstruction (e.g., kidney stone, bladder outlet obstruction); or a concomitant serious infectious process like endocarditis or septic emboli. For Viral Illness & COVID-19: A concomitant serious infectious process like endocarditis or empyema.  General Comorbidities or Status:  The patient is significantly immunosuppressed (this applies to Pneumonia, Cellulitis, UTI, Viral Illness, and COVID-19). The patient meets inpatient admission criteria for a second diagnosis, or has care needs beyond the capabilities of Hospital at Home due to an active clinically significant comorbidity. (This is a general exclusion across all listed conditions)  "

## 2024-01-12 NOTE — Plan of Care (Signed)
 Courtesy visit- No billing:  Patient is seen and examined today morning.  She is admitted for evaluation of fever of unknown origin, SIRS.  Last admission revealed did not reveal any infectious etiology.  She has generalized weakness, on and off fevers since more than 1 month.  Patient also reported pain over right side back, under her breast, pleuritic and unable to take deep breath.  WBC 14.1, ESR, CRP and BNP elevated.  Urinalysis unremarkable, blood cultures and urine cultures pending.  Echo from EF 50 to 55%, elevated pulmonary artery systolic pressures.  Plan: Will continue Rocephin  therapy for possible pneumonia.  Will give her 20 mg of Lasix . Fevers could be systemic manifestation of her Sjogren syndrome. May need to involve rheumatologist given persistent fevers for input. Follow culture results. Further management pending clinical course.

## 2024-01-12 NOTE — Assessment & Plan Note (Addendum)
 01/12/2024 she remains in normal sinus rhythm by EKG on admission.   1/7>>10/2024 HR's controlled --Continue Eliquis  5 mg twice daily, amiodarone  200 mg daily, Lopressor  25 mg twice daily.

## 2024-01-12 NOTE — Assessment & Plan Note (Addendum)
 01/16/2024 Stable without active swelling of her joints.  She thinks that methotrexate  has helped her tremendously since starting it several months ago.

## 2024-01-12 NOTE — Assessment & Plan Note (Addendum)
 Patient admitted with room air saturations of 84%.  She has small bilateral pleural effusions and a history of seronegative rheumatoid arthritis and Sjogren syndrome.  She sees Dr. Kara with pulmonary critical care as an outpatient.  She does not normally use home oxygen.   01/13/24 - weaned off O2 to room air by afternoon 1/8>>9/26 - remains stable on RA --Supplement O2 PRN to maintain spO2 > 90% --Mgmt of underlying issues & treatment of pneumonia as outlined

## 2024-01-12 NOTE — Assessment & Plan Note (Addendum)
 01/15/2024 patient did get treatment with IV Rocephin  and p.o. Omnicef  while she was admitted to the hospital in December.  Suspect due to her immunosuppressed status, the 7 days of antibiotics she received or probably not enough.  Agree with initial plan full week of IV Rocephin , or at least 5 days pending clinical course.   --Follow pending Legionella ag --Strep pneumo antigen negative --As needed albuterol  nebs.  --s/p 5 days IV Rocephin  --Transition to PO Ceftin  with ODT Zofran  x 3 more days --Completed 3 days IV Zithromax   1/10 - discharge on PO Ceftin  to complete 7 day course of antibiotics (5 doses starting this evening)

## 2024-01-12 NOTE — Assessment & Plan Note (Addendum)
 01/15/2024 chronic. No acute issues

## 2024-01-12 NOTE — Evaluation (Signed)
 Physical Therapy Evaluation Patient Details Name: Christy Moore MRN: [id number] DOB: 11-05-1945 Today's Date: 01/12/2024  History of Present Illness  79  yo female presents to therapy following hospital admission on 01/11/2024 due to atypical angina and hypoxia and one episode of RVR and N and V. Pt has met SIRS criteria with fevert and negative for infectious workup. Pt recently admitted to hospital from 12/9-12/12/2023. Pt PMH includes but is not limited to: PAF, bifascicular block, Sjogrens syndrome, RA, ILD, HTN, hypothyroidism, esophageal stricture, MGUS, arthritis, GERD, depression, and seizures.  Clinical Impression      Pt admitted with above diagnosis.  Pt currently with functional limitations due to the deficits listed below (see PT Problem List). Pt in bed when PT arrived. Spouse and visitor present. Pt agreeable to therapy intervention. Pt on 2 L/min at rest and no reports of SOB, O2 saturation difficult to obtain consistent pleth on dynamap due to cold nature of hands 99-100% and >/=96% on RA with exertion and at rest with pt indicating SOB with verbal communication. Pt is mod I for bed mobility, sit to stand, gait tasks with close S with one episode of LOB with pt able to implement self recovery strategies. Pt is motivated to d/c home however expressed concern per ongoing R flank, back, chest and rib pain impacting ability to take a deep breath. Pt will benefit from acute skilled PT to increase their independence and safety with mobility to allow discharge.       If plan is discharge home, recommend the following: A little help with walking and/or transfers;Assist for transportation;Help with stairs or ramp for entrance;Assistance with cooking/housework   Can travel by private vehicle        Equipment Recommendations None recommended by PT  Recommendations for Other Services       Functional Status Assessment Patient has had a recent decline in their functional status and  demonstrates the ability to make significant improvements in function in a reasonable and predictable amount of time.     Precautions / Restrictions Precautions Precautions: Fall Restrictions Weight Bearing Restrictions Per Provider Order: No      Mobility  Bed Mobility Overal bed mobility: Modified Independent                  Transfers Overall transfer level: Modified independent Equipment used: None               General transfer comment: no cues no AD    Ambulation/Gait Ambulation/Gait assistance: Supervision Gait Distance (Feet): 450 Feet Assistive device: None Gait Pattern/deviations: Step-through pattern, Staggering left, Staggering right Gait velocity: slightly decreased     General Gait Details: LOB x 1 with scissor step and pt able to implement self recovery stratagies, pt initally used dynamap for some support and able to progress no UE support no reports of increased chest, flank or rib pain, pt on RA, pt difficult to obtain pleth with dynamap due to cold nature of hands however pt reported mild SOB with verbal communication only  Stairs            Wheelchair Mobility     Tilt Bed    Modified Rankin (Stroke Patients Only)       Balance Overall balance assessment: Mild deficits observed, not formally tested  Pertinent Vitals/Pain Pain Assessment Pain Assessment: Faces Faces Pain Scale: Hurts even more Pain Location: R flank, back, chest and rib Pain Descriptors / Indicators: Aching, Constant, Guarding, Sore Pain Intervention(s): Limited activity within patient's tolerance, Monitored during session    Home Living Family/patient expects to be discharged to:: Private residence Living Arrangements: Spouse/significant other Available Help at Discharge: Family Type of Home: House Home Access: Stairs to enter Entrance Stairs-Rails: Left Entrance Stairs-Number of Steps: 3    Home Layout: One level Home Equipment: Agricultural Consultant (2 wheels);Cane - single point      Prior Function Prior Level of Function : Independent/Modified Independent             Mobility Comments: IND no AD for all ADLs, self care tasks and IADLs       Extremity/Trunk Assessment        Lower Extremity Assessment Lower Extremity Assessment: Overall WFL for tasks assessed    Cervical / Trunk Assessment Cervical / Trunk Assessment: Normal  Communication   Communication Communication: No apparent difficulties    Cognition Arousal: Alert Behavior During Therapy: WFL for tasks assessed/performed   PT - Cognitive impairments: No apparent impairments                         Following commands: Intact       Cueing       General Comments      Exercises     Assessment/Plan    PT Assessment Patient needs continued PT services  PT Problem List         PT Treatment Interventions Gait training;Functional mobility training;Stair training;Therapeutic activities;Therapeutic exercise;Balance training;Neuromuscular re-education;Patient/family education    PT Goals (Current goals can be found in the Care Plan section)  Acute Rehab PT Goals Patient Stated Goal: to have the pain go away and be back to normal PT Goal Formulation: With patient Time For Goal Achievement: 01/26/24 Potential to Achieve Goals: Good    Frequency Min 3X/week     Co-evaluation               AM-PAC PT 6 Clicks Mobility  Outcome Measure Help needed turning from your back to your side while in a flat bed without using bedrails?: None Help needed moving from lying on your back to sitting on the side of a flat bed without using bedrails?: None Help needed moving to and from a bed to a chair (including a wheelchair)?: None Help needed standing up from a chair using your arms (e.g., wheelchair or bedside chair)?: None Help needed to walk in hospital room?: A Little Help needed  climbing 3-5 steps with a railing? : A Little 6 Click Score: 22    End of Session Equipment Utilized During Treatment: Gait belt;Oxygen Activity Tolerance: Patient tolerated treatment well;No increased pain Patient left: in bed;with call bell/phone within reach;with family/visitor present;Other (comment) (RT) Nurse Communication: Mobility status PT Visit Diagnosis: Difficulty in walking, not elsewhere classified (R26.2);Unsteadiness on feet (R26.81);Pain Pain - part of body:  (chest, back, flank and rib)    Time: 8869-8846 PT Time Calculation (min) (ACUTE ONLY): 23 min   Charges:   PT Evaluation $PT Eval Low Complexity: 1 Low PT Treatments $Gait Training: 8-22 mins PT General Charges $$ ACUTE PT VISIT: 1 Visit         [place], PT Acute Rehab   [place] VEAR Drone 01/12/2024, 12:08 PM

## 2024-01-12 NOTE — Assessment & Plan Note (Addendum)
 Normal TSH 1.16. --Continue Synthroid  100 mcg daily.

## 2024-01-12 NOTE — Assessment & Plan Note (Addendum)
 01/16/2024 she is immunosuppressed on methotrexate  that she takes for her Sjogren's and seronegative rheumatoid arthritis.  This may be the reason why she failed outpatient treatment with a short course of p.o. antibiotics.  She was better when she was in the hospital and IV Rocephin . --Holding methotrexate  - follow up with rheumatology for when to resume --On Prednisone  5 mg daily

## 2024-01-12 NOTE — Assessment & Plan Note (Addendum)
 BP 136/71 --continue Lopressor  25 mg twice daily.   --Hold Norvasc  on d/c to avoid hypotension --Stopped IV lasix  (last 1/7)   --Follow up with PCP

## 2024-01-13 ENCOUNTER — Other Ambulatory Visit: Payer: Self-pay | Admitting: Family Medicine

## 2024-01-13 DIAGNOSIS — J90 Pleural effusion, not elsewhere classified: Secondary | ICD-10-CM

## 2024-01-13 DIAGNOSIS — J181 Lobar pneumonia, unspecified organism: Secondary | ICD-10-CM

## 2024-01-13 DIAGNOSIS — E039 Hypothyroidism, unspecified: Secondary | ICD-10-CM

## 2024-01-13 DIAGNOSIS — I10 Essential (primary) hypertension: Secondary | ICD-10-CM

## 2024-01-13 DIAGNOSIS — T451X5A Adverse effect of antineoplastic and immunosuppressive drugs, initial encounter: Secondary | ICD-10-CM

## 2024-01-13 DIAGNOSIS — Z79631 Long term (current) use of antimetabolite agent: Secondary | ICD-10-CM

## 2024-01-13 DIAGNOSIS — D84821 Immunodeficiency due to drugs: Secondary | ICD-10-CM

## 2024-01-13 DIAGNOSIS — I480 Paroxysmal atrial fibrillation: Secondary | ICD-10-CM

## 2024-01-13 DIAGNOSIS — K219 Gastro-esophageal reflux disease without esophagitis: Secondary | ICD-10-CM

## 2024-01-13 DIAGNOSIS — J9601 Acute respiratory failure with hypoxia: Secondary | ICD-10-CM

## 2024-01-13 DIAGNOSIS — M3500 Sicca syndrome, unspecified: Secondary | ICD-10-CM

## 2024-01-13 DIAGNOSIS — A419 Sepsis, unspecified organism: Principal | ICD-10-CM

## 2024-01-13 LAB — CBC WITH DIFFERENTIAL/PLATELET
Abs Immature Granulocytes: 0.07 K/uL (ref 0.00–0.07)
Basophils Absolute: 0 K/uL (ref 0.0–0.1)
Basophils Relative: 0 %
Eosinophils Absolute: 0 K/uL (ref 0.0–0.5)
Eosinophils Relative: 0 %
HCT: 37.6 % (ref 36.0–46.0)
Hemoglobin: 12.3 g/dL (ref 12.0–15.0)
Immature Granulocytes: 1 %
Lymphocytes Relative: 4 %
Lymphs Abs: 0.5 K/uL — ABNORMAL LOW (ref 0.7–4.0)
MCH: 31 pg (ref 26.0–34.0)
MCHC: 32.7 g/dL (ref 30.0–36.0)
MCV: 94.7 fL (ref 80.0–100.0)
Monocytes Absolute: 0.4 K/uL (ref 0.1–1.0)
Monocytes Relative: 2 %
Neutro Abs: 14.5 K/uL — ABNORMAL HIGH (ref 1.7–7.7)
Neutrophils Relative %: 93 %
Platelets: 369 K/uL (ref 150–400)
RBC: 3.97 MIL/uL (ref 3.87–5.11)
RDW: 14 % (ref 11.5–15.5)
WBC: 15.5 K/uL — ABNORMAL HIGH (ref 4.0–10.5)
nRBC: 0 % (ref 0.0–0.2)

## 2024-01-13 LAB — MAGNESIUM: Magnesium: 2.3 mg/dL (ref 1.7–2.4)

## 2024-01-13 LAB — BASIC METABOLIC PANEL WITH GFR
Anion gap: 11 (ref 5–15)
BUN: 12 mg/dL (ref 8–23)
CO2: 29 mmol/L (ref 22–32)
Calcium: 9.3 mg/dL (ref 8.9–10.3)
Chloride: 99 mmol/L (ref 98–111)
Creatinine, Ser: 0.85 mg/dL (ref 0.44–1.00)
GFR, Estimated: >60 mL/min
Glucose, Bld: 137 mg/dL — ABNORMAL HIGH (ref 70–99)
Potassium: 5 mmol/L (ref 3.5–5.1)
Sodium: 139 mmol/L (ref 135–145)

## 2024-01-13 MED ORDER — METHYLPREDNISOLONE SODIUM SUCC 40 MG IJ SOLR
40.0000 mg | Freq: Every day | INTRAMUSCULAR | Status: DC
  Administered 2024-01-13 – 2024-01-14 (×2): 40 mg via INTRAVENOUS
  Filled 2024-01-13 (×4): qty 1

## 2024-01-13 MED ORDER — LIDOCAINE 5 % EX PTCH
1.0000 | MEDICATED_PATCH | CUTANEOUS | Status: DC
  Filled 2024-01-13 (×5): qty 1

## 2024-01-13 NOTE — Plan of Care (Signed)

## 2024-01-13 NOTE — TOC Progression Note (Signed)
 Transition of Care [facility]) - Progression Note    Patient Details  Name: Christy Moore MRN: [id number] Date of Birth: 1945/12/30  [facility] of [facility]) CM/SW Contact  Debarah Saunas, RN Phone Number: 01/13/2024, 9:03 AM  Clinical Narrative:    RNCM following for discharge needs.       Barriers to Discharge: Continued Medical Work up (IV abx, labs)               Expected Discharge Plan and Services       Living arrangements for the past 2 months: Single Family Home                                       Social Drivers of Health (SDOH) Interventions SDOH Screenings   Food Insecurity: No Food Insecurity (01/12/2024)  Housing: Low Risk (01/12/2024)  Transportation Needs: No Transportation Needs (01/12/2024)  Utilities: Not At Risk (01/12/2024)  Alcohol Screen: Low Risk (11/05/2022)  Depression (PHQ2-9): Low Risk (12/21/2023)  Financial Resource Strain: Low Risk (11/19/2022)  Physical Activity: Insufficiently Active (11/19/2022)  Social Connections: Socially Integrated (01/12/2024)  Stress: No Stress Concern Present (11/19/2022)  Tobacco Use: Low Risk (01/11/2024)  Health Literacy: Adequate Health Literacy (11/05/2022)    Readmission Risk Interventions    12/17/2023    2:59 PM  Readmission Risk Prevention Plan  Post Dischage Appt Complete  Medication Screening Complete  Transportation Screening Complete

## 2024-01-13 NOTE — Progress Notes (Signed)
 The pt was admitted today to the hospital at home program.  The pt and her husband whom lives with her have a clean and well maintained home. They do have pets (1 dog and 2 cats).   The pt had been dropped off prior by the field team EMTS. The pt was asked if she had any questions about the equipment and she stated that she understood the basics of how to get in contact with the nurse if needed.   Med rec was done and the pt did request cough medication. It was noted that the pt takes methotrexate  and this not one of the medication provided by pharmacy. The pt did state thought that she only takes it  on Sundays.  He pt's medications were labeled and placed in a non-tampered bag. The pt placed them on the dinning table.  Vitals were taken and are as noted. Lung sounds were ausculted and noted to be clear in the upper lobes but diminished in the lower lobes with a sounds of a small pleural rub on the lower left lobe.   The virtual nurse was called and medications were done with her. The pt was shown how to use the nebulizer machine in the case that she needed a PRN breathing treatment.   The pt was asked if there was anything that could be done for her or if she had any questions and she denied same. The pt was told to call if anything were to change and she agreed to same.

## 2024-01-13 NOTE — Progress Notes (Signed)
 Video call completed with patient. Introduced myself as the CHARITY FUNDRAISER for the night and reminded patient that I can be reached anytime via phone or tablet call if needed overnight. Patient stable and denies pain. States she has some stiffness but declines anything for pain at this time. Bedtime medications were administered with RN oversight. Patient very happy with the [facility] program and thankful for the care she is getting. All questions answered before ending call with patient.

## 2024-01-13 NOTE — Progress Notes (Signed)
 0603-Video call completed. Patient medication self- administered /with [employer] per [facility] protocol. Patient reported cough is much better and pain with coughing has also decreased significantly. All questions answered patient encouraged to contact [facility] as needed.

## 2024-01-13 NOTE — Plan of Care (Signed)
" °  Problem: Education: Goal: Knowledge of General Education information will improve Description: Including pain rating scale, medication(s)/side effects and non-pharmacologic comfort measures Outcome: Progressing   Problem: Health Behavior/Discharge Planning: Goal: Ability to manage health-related needs will improve Outcome: Progressing   Problem: Clinical Measurements: Goal: Ability to maintain clinical measurements within normal limits will improve Outcome: Progressing Goal: Respiratory complications will improve Outcome: Progressing Goal: Cardiovascular complication will be avoided Outcome: Progressing   Problem: Activity: Goal: Risk for activity intolerance will decrease Outcome: Progressing   Problem: Nutrition: Goal: Adequate nutrition will be maintained Outcome: Progressing   Problem: Pain Managment: Goal: General experience of comfort will improve and/or be controlled Outcome: Progressing   Problem: Safety: Goal: Ability to remain free from injury will improve Outcome: Progressing   "

## 2024-01-13 NOTE — Plan of Care (Signed)
" °  Problem: Clinical Measurements: Goal: Ability to maintain clinical measurements within normal limits will improve Outcome: Progressing   Problem: Clinical Measurements: Goal: Will remain free from infection Outcome: Progressing   Problem: Clinical Measurements: Goal: Diagnostic test results will improve Outcome: Progressing   Problem: Clinical Measurements: Goal: Respiratory complications will improve Outcome: Progressing   Problem: Activity: Goal: Risk for activity intolerance will decrease Outcome: Progressing   Problem: Nutrition: Goal: Adequate nutrition will be maintained Outcome: Progressing   Problem: Elimination: Goal: Will not experience complications related to bowel motility Outcome: Progressing Goal: Will not experience complications related to urinary retention Outcome: Progressing   Problem: Pain Managment: Goal: General experience of comfort will improve and/or be controlled Outcome: Progressing   Problem: Safety: Goal: Ability to remain free from injury will improve Outcome: Progressing   Problem: Skin Integrity: Goal: Risk for impaired skin integrity will decrease Outcome: Progressing   "

## 2024-01-13 NOTE — Progress Notes (Addendum)
 1925-Video contact call Patient identifiers reviewed, RN introduction completed. Patient A&O sitting in the chair, Oxygen Joyce intact, Patient reported and confirmed she is not experiencing-respiratory concerns or distress; patient reported she did not need cough medicine, reported and confirmed coughing better with use of O2. Patient encouraged to call if need changes. Patient reported and confirmed pain level is a 4 related to coughing throughout the day. Patient reported and confirmed previous doses of pain medication are still effective, and no additional dose of pain medication is needed at the time of the video call.     2225--Video call completed, medication administration assistance completed, skin assessment conducted, all questioned answered. Patient agreed to PRN cough medicine to assist with cough and pain. Patient currently denied pain; patient informed Tessalon  Perls ordered as needed. Overnight monitoring and use of Oxygen at HS reviewed. Early AM medication assistance plan reviewed; patient encouraged to contact virtual RN as needed.

## 2024-01-13 NOTE — Progress Notes (Signed)
 "  [facility] Daily Progress Note   Patient: Christy Moore  MRN: [id number]  DOB: [birth year]  DOA: 01/11/2024  DOS: the patient was seen and examined on @TODAY @    Patient identified themself as Christy Moore  DOB 03-Apr-1945  Medic Consuelo Kerns present in the home during encounter and performed the assessment and physical exam. Patient was seen today via video conference; my physical location [place], [place]    Brief hospital course:  CC: chest pain for 1.5 months HPI: Christy Moore is a 79 y.o. female with medical history significant for PAF on Eliquis  and amiodarone , bifascicular block, Sjogren syndrome, rheumatoid arthritis, mild ILD with bronchiectasis, HTN, hypothyroidism, esophageal stricture s/p dilation, MGUS who presented to the ED for evaluation of chest pain.   Patient recently admitted from 12/15/2023-12/18/2023.  Initially presented with atypical chest pain and mild hypoxia.  She met SIRS criteria.  She had negative infectious workup.  During admission she went to atrial flutter with RVR.  She was given metoprolol  and converted back to sinus rhythm but did have a 17-second pause.  She was seen by cardiology.  It was felt that A-fib/flutter was triggered by infection.  She has been started on Eliquis , metoprolol , and amiodarone .   Patient returns with musculoskeletal pain throughout her thorax.  She says pain is currently on the right side of her chest and feels like muscle soreness.  She says mostly the pain has been from her hips up to her mid back.  She is also having pain above her right clavicle.  She has chronic respiratory issues with bronchiectasis and states that she has never been able to take deep breaths.  She reports appetite has been low for several weeks and she has had an unintentional 5 pound weight loss.  She reports early satiety without dysphagia.   She has had some shaking chills without diaphoresis.  She has not really had a cough.  She had an episode of  nausea and vomiting on arrival to the floor but no other recent episodes.  She has not had abdominal pain.  She reports good urine output without dysuria.  She has not had any diarrhea.  She has not had any swelling in her lower extremities.  She denies any skin changes or rash.  No obvious bug, tick, insect bites.   She follows with rheumatology, Dr. Dolphus, for Sjogren syndrome and seronegative rheumatoid arthritis and has been on methotrexate  which she takes every Sunday.  Patient states she has not had any issues with joint pain or swelling.   [facility] ED Course  Labs/Imaging on admission: I have personally reviewed following labs and imaging studies.   Initial vitals showed BP 158/73, pulse 86, RR 20, temp 98.8 F, SpO2 99% on room air.  Tmax 101.2 F rectally.   Labs showed WBC 12.8, hemoglobin 12.6, platelets 356, sodium 136, potassium 4.2, bicarb 26, BUN 10, creatinine 0.87, serum glucose 178, troponin T <15 x 2, lactic acid 1.0.   SARS-CoV-2, influenza, RSV PCR negative.  Blood cultures and urinalysis ordered and pending collection.   CTA chest negative for PE.  New small bilateral pleural effusions with associated passive atelectasis in the lower lobes seen without pulmonary infarction, edema, or infection.  Mild pericardial thickening noted.   Patient was given IV morphine  4 mg x 2, IV Dilaudid  1 mg, 1 L LR, and IV ceftriaxone .  The hospitalist service was consulted for admission.  Significant Events: Admitted 01/11/2024 for SIRS/fever  Admission Labs: WBC 12.8, HgB 12.6, Plt 356 Na 136, K 4.2, CO2 of 26, BUN 10, Scr 0.87, glu 178 Lactic acid 1.0 Covid/Flu/RSV negative UA negative RVP negative TSH 1.160 ProBNP 1011 Procalcitonin 0.23 ESR 83 CRP 23.4  Admission Imaging Studies: CXR Decreased small left pleural effusion with adjacent airspace disease, favoring atelectasis. 2. Cardiomegaly with trace right pleural fluid. 3. Aortic atherosclerosis  (ICD10-I70.0). CTPA No evidence of pulmonary embolism. 2. New small bilateral pleural effusions with associated passive atelectasis in the lower lobes, without pulmonary infarction, edema, or infection. 3. Mild pericardial thickening.  Significant Labs: Cr trend: 0.87 >> 0.71 >> 0.85 WBC trend: 12.8 >> 14.1>> 15.5  Significant Imaging Studies:      Assessment and Plan:  * Sepsis due to pneumonia (HCC) 01/12/2024 she meets sepsis criteria on admission.White count 12.8, 84% room air saturations, fever 101.2.  Due to pneumonia.  acute respiratory with hypoxia as the acute organ dysfunction. 01/13/24 - sepsis physiology improved.  WBC remains elevated likely due to steroids. Clinically improving.  Immunosuppression due to drug therapy 01/13/2024 she is immunosuppressed on methotrexate  that she takes for her Sjogren's and seronegative rheumatoid arthritis.  This may be the reason why she failed outpatient treatment with a short course of p.o. antibiotics.  She was better when she was in the hospital and IV Rocephin . --Holding methotrexate .     Pleurisy with effusion 01/12/2024 patient with continued right sided pleuritic chest pain.  She has small bilateral pleural effusions left greater than right.  Cannot give her IV Toradol due to continued systemic anticoagulation with Eliquis  for her A-fib.   --Add lidocaine  patch --Continue oral morphine  7.5 mg p.o. every 4 hours as needed moderate or severe pain.   --Continue IV steroids to help with the pleuritic chest pain - Solu-Medrol  40 mg daily.   --Due to small b/l pleural effusion, continue IV Lasix  20 mg daily & continue to closely monitor   Community acquired bilateral lower lobe pneumonia 01/13/2024 patient did get treatment with IV Rocephin  and p.o. Omnicef  while she was admitted to the hospital in December.  Suspect due to her immunosuppressed status, the 7 days of antibiotics she received or probably not enough.  Agree with initial plan full  week of IV Rocephin , or at least 5 days pending clinical course.   --Follow pending Legionella and strep pneumo urinary antigens  --As needed albuterol  nebs.  --Day #3 of 7 of IV Rocephin  --Day #2 of 3 of IV Zithromax (pt has GI/N/V with PO Zithromax ).  Seronegative rheumatoid arthritis (HCC) 01/13/2024 Stable without active swelling of her joints.  She thinks that methotrexate  has helped her tremendously since starting it several months ago.   Paroxysmal atrial fibrillation (HCC) 01/12/2024 she remains in normal sinus rhythm by EKG on admission.   01/13/2024 HR's controlled --Continue Eliquis  5 mg twice daily, amiodarone  200 mg daily, Lopressor  25 mg twice daily.   Acute respiratory failure with hypoxia (HCC) 01/13/2024 patient admitted with room air saturations of 84%.  She has small bilateral pleural effusions and a history of seronegative rheumatoid arthritis and Sjogren syndrome.  She sees Dr. Kara with pulmonary critical care as an outpatient.  She does not normally use home oxygen.   --Continue with 2 L a minute for now.   --Mgmt of underlying issues with diuresis, IV steroids, treatment of her pneumonia as outlined --Wean O2 as tolerated, maintain spO2 > 90%  Sjogren's disease 01/12/2024 treated as an outpatient with methotrexate  7.5 mg twice a day on  Sundays.  I think part of her pulmonary process may be a flare of her Sjogren's.   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 No acute issues   Essential hypertension 01/13/2024 BP 142/78 stable --continue Lopressor  25 mg twice daily.   --Also on IV Lasix  20 mg daily.   --Holding Norvasc  for now, resume when BP warrants or Lasix  stopped   Acquired hypothyroidism 01/13/2024 stable. Normal TSH 1.16. --Continue Synthroid  100 mcg daily.        Patient Active Problem List   Diagnosis Date Noted   Community acquired bilateral lower lobe  pneumonia 01/12/2024    Priority: High   Pleurisy with effusion 01/12/2024    Priority: High   Immunosuppression due to drug therapy 01/12/2024    Priority: High   Sepsis due to pneumonia (HCC) 12/15/2023    Priority: High   Paroxysmal atrial fibrillation (HCC) 01/12/2024    Priority: Medium    Seronegative rheumatoid arthritis (HCC) 01/12/2024    Priority: Medium    Acute respiratory failure with hypoxia (HCC) 12/15/2023    Priority: Medium    Sjogren's disease 01/22/2012    Priority: Medium    MGUS (monoclonal gammopathy of unknown significance) 08/27/2023    Priority: Low   Essential hypertension 03/19/2011    Priority: Low   Acquired hypothyroidism 02/26/2010    Priority: Low   Dysphagia 12/16/2023   Depression 07/13/2020   Thrush 11/03/2012   GERD (gastroesophageal reflux disease) 03/19/2011   Raynaud's disease 03/19/2011   Cardiomegaly 02/26/2010   Liver hemangioma 02/26/2010   Migraine 02/26/2010   Esophageal stricture 02/26/2010   Weight loss 02/26/2010   OTHER CHRONIC SINUSITIS 12/20/2007   Rosacea 12/20/2007      Subjective / Interval 24 hour History:   Pt seen virtually during AM medic visit to the home today.  She reports feeling overall pretty good except for right sided chest pain.  This continues to be worse with taking a breath, but she is able to take somewhat deeper breaths today than she has previously.  She denies fevers or chills.  Reports dry mouth and drinking lots of water.  Slept well overnight.  Has not required morphine  for pain since coming home yesterday.  Reports poor appetite.  Discusses her history of recurrent pneumonias every 2 to 3 months and seeing Dr. Kara with pulmonary, this Sjogren's diagnosis and finding of interstitial lung changes in both lungs.  She reports that methotrexate  and prednisone  when started by her rheumatologist initially have dramatically helped her joint pain and swelling.  No other acute complaints at this  time.     Antibiotic Therapy: Anti-infectives (From admission, onward)    Start     Dose/Rate Route Frequency Ordered Stop   01/12/24 1800  cefTRIAXone  (ROCEPHIN ) 2 g in sodium chloride  0.9 % 100 mL IVPB  Status:  Discontinued        2 g 200 mL/hr over 30 Minutes Intravenous Every 24 hours 01/12/24 1014 01/12/24 1437   01/12/24 1600  cefTRIAXone  (ROCEPHIN ) 2 g in sodium chloride  0.9 % 100 mL IVPB        2 g 200 mL/hr over 30 Minutes Intravenous Every 24 hours 01/12/24 1437 01/16/24 0959   01/12/24 1600  azithromycin  (ZITHROMAX ) 500 mg in sodium chloride  0.9 % 250 mL IVPB       Note to Pharmacy: Allergy to azithro only nausea.  Hoping to avoid by  avoiding the gut   500 mg 250 mL/hr over 60 Minutes Intravenous Every 24 hours 01/12/24 1515 01/15/24 1614   01/11/24 1915  cefTRIAXone  (ROCEPHIN ) 2 g in sodium chloride  0.9 % 100 mL IVPB        2 g 200 mL/hr over 30 Minutes Intravenous  Once 01/11/24 1903 01/11/24 1942       Procedures: None  Consultants: None          Physical Exam:    01/13/2024    3:33 PM 01/13/2024    8:52 AM 01/13/2024    6:46 AM  Vitals with BMI  Weight  142 lbs   BMI  24.01   Systolic 143 142   Diastolic 76 78   Pulse 70 68 57     Bedside physical exam was performed by RN listed above. Below exam findings are based on their in person physical exam findings and my observations during virtual encounter.  General exam: awake, alert, no acute distress HEENT: Voice clear, hearing grossly normal  Respiratory system: Lungs overall clear bilaterally but diminished at the right base, normal respiratory effort at rest, no accessory muscle use, on 3 L/min nasal cannula O2 Cardiovascular system: normal S1/S2, RRR, no edema, symmetric strong radial pulses Gastrointestinal system: soft, nontender nondistended Central nervous system: A&O x 4. no gross focal neurologic deficits, normal speech Extremities: moves all, no edema, normal tone Psychiatry: normal  mood, congruent affect, judgement and insight appear normal    Data Reviewed:  As reviewed above   Family Communication: None present during virtual   Disposition: Status is: Inpatient Remains inpatient appropriate because: Remains on IV therapies for pneumonia and pleurisy and weaning oxygen.  Requires further clinical improvement and extrended IV antibiotic coverage given her underlying immunosuppression.   Planned Discharge Destination: Home    Time spent: 45 minutes  Author: Burnard DELENA Cunning, DO [facility]  08/27/2023 7:00 AM  For on call review [email or web address].   "

## 2024-01-13 NOTE — Progress Notes (Signed)
 Per Dr. Fausto: Genna for patient to take off supplemental oxygen. SpO2 currently 97% on room air via [employer].

## 2024-01-13 NOTE — Progress Notes (Signed)
 H@H  medic out to see patient for first visit of the day. On arrival, I was met at the door by the patient who welcomed me in. See flowsheet for assessment. VSS. The patient has complaints of pain to the (R) flank that wraps around her mid back. Patient states that she took Tylenol  this morning, prior to arrival, with the virtual RN for pain relief. Pt states that it helped a little bit. Medications were administered with oversight from virtual RN. A video visit with the virtual RN and provider was conducted and completed. Labs were collected as ordered. Famotidine  was removed from the home as it will be discontinued by Dr. Fausto. Lidocaine  patches will be ordered for extra pain relief. The patient has no other complaints at this time. No other tasks needed during this visit. The patient was left in care of herself with her husband by her side. H@H  visit complete.

## 2024-01-13 NOTE — Progress Notes (Signed)
 H@H  medic out to see patient for second visit of the day. VSS. No changes from this morning. Pt has been doing well without any oxygen on during the day today. IV abx were given as ordered. Pt agreed to call in tonight to virtual RN for medications. No other tasks needed for this visit. H@H  visit complete.

## 2024-01-14 ENCOUNTER — Ambulatory Visit (HOSPITAL_COMMUNITY): Admitting: Internal Medicine

## 2024-01-14 LAB — CBC
HCT: 37.3 % (ref 36.0–46.0)
Hemoglobin: 12 g/dL (ref 12.0–15.0)
MCH: 30.4 pg (ref 26.0–34.0)
MCHC: 32.2 g/dL (ref 30.0–36.0)
MCV: 94.4 fL (ref 80.0–100.0)
Platelets: 384 K/uL (ref 150–400)
RBC: 3.95 MIL/uL (ref 3.87–5.11)
RDW: 14.2 % (ref 11.5–15.5)
WBC: 25 K/uL — ABNORMAL HIGH (ref 4.0–10.5)
nRBC: 0 % (ref 0.0–0.2)

## 2024-01-14 LAB — BASIC METABOLIC PANEL WITH GFR
Anion gap: 12 (ref 5–15)
BUN: 19 mg/dL (ref 8–23)
CO2: 24 mmol/L (ref 22–32)
Calcium: 9 mg/dL (ref 8.9–10.3)
Chloride: 104 mmol/L (ref 98–111)
Creatinine, Ser: 0.79 mg/dL (ref 0.44–1.00)
GFR, Estimated: >60 mL/min
Glucose, Bld: 106 mg/dL — ABNORMAL HIGH (ref 70–99)
Potassium: 4.6 mmol/L (ref 3.5–5.1)
Sodium: 140 mmol/L (ref 135–145)

## 2024-01-14 LAB — PROCALCITONIN: Procalcitonin: 0.18 ng/mL

## 2024-01-14 NOTE — Progress Notes (Signed)
" °   01/14/24 0734  TOC Barriers to Discharge  Barriers to Discharge Continued Medical Work up (IV abx)    "

## 2024-01-14 NOTE — Progress Notes (Addendum)
 1949--Contact call via phone completed-- RN introduction; patient identifiers reviewed. Patient A&O, patient reported feeling much better today, currently stable, no pain or health concerns at this time.  Overnight monitoring and plan for medication administration completed. Patient agreed to utilize the I need help button when ready for night medications.  Patient encouraged to contact [facility] RN as needed.     2212--Video call completed, medication administration assistance completed. Patient currently denied pain; patient informed Early AM medication assistance plan reviewed; patient encouraged to contact virtual RN as needed.

## 2024-01-14 NOTE — Progress Notes (Signed)
"  H@H  medic out to see patient this afternoon for her second visit of the day. On arrival, I was welcomed in by the patient at the door. VSS. IV abx were administered. Pt denies pain this visit. IV was flushed and maintained. No other tasks were needed for this visit. Pt left in care of herself with her husband by her side. H@H  visit completed. "

## 2024-01-14 NOTE — Plan of Care (Signed)

## 2024-01-14 NOTE — Progress Notes (Signed)
 "  [facility]ospital [facility] Daily Progress Note   Patient: Christy Moore  MRN: [id number]  DOB: 01/19/1945  DOA: 01/11/2024  DOS: the patient was seen and examined on @TODAY @    Patient identified themself as Christy Moore  DOB 26-Feb-1945  Medic Consuelo Kerns present in the home during encounter and performed the assessment and physical exam. Patient was seen today via video conference; my physical location [place], [place]    Brief hospital course:  CC: chest pain for 1.5 months HPI: Charmane Protzman Tandon is a 79 y.o. female with medical history significant for PAF on Eliquis  and amiodarone , bifascicular block, Sjogren syndrome, rheumatoid arthritis, mild ILD with bronchiectasis, HTN, hypothyroidism, esophageal stricture s/p dilation, MGUS who presented to the ED for evaluation of chest pain.   Patient recently admitted from 12/15/2023-12/18/2023.  Initially presented with atypical chest pain and mild hypoxia.  She met SIRS criteria.  She had negative infectious workup.  During admission she went to atrial flutter with RVR.  She was given metoprolol  and converted back to sinus rhythm but did have a 17-second pause.  She was seen by cardiology.  It was felt that A-fib/flutter was triggered by infection.  She has been started on Eliquis , metoprolol , and amiodarone .   Patient returns with musculoskeletal pain throughout her thorax.  She says pain is currently on the right side of her chest and feels like muscle soreness.  She says mostly the pain has been from her hips up to her mid back.  She is also having pain above her right clavicle.  She has chronic respiratory issues with bronchiectasis and states that she has never been able to take deep breaths.  She reports appetite has been low for several weeks and she has had an unintentional 5 pound weight loss.  She reports early satiety without dysphagia.   She has had some shaking chills without diaphoresis.  She has not really had a cough.  She had an episode of  nausea and vomiting on arrival to the floor but no other recent episodes.  She has not had abdominal pain.  She reports good urine output without dysuria.  She has not had any diarrhea.  She has not had any swelling in her lower extremities.  She denies any skin changes or rash.  No obvious bug, tick, insect bites.   She follows with rheumatology, Dr. Dolphus, for Sjogren syndrome and seronegative rheumatoid arthritis and has been on methotrexate  which she takes every Sunday.  Patient states she has not had any issues with joint pain or swelling.   [facility] ED Course  Labs/Imaging on admission: I have personally reviewed following labs and imaging studies.   Initial vitals showed BP 158/73, pulse 86, RR 20, temp 98.8 F, SpO2 99% on room air.  Tmax 101.2 F rectally.   Labs showed WBC 12.8, hemoglobin 12.6, platelets 356, sodium 136, potassium 4.2, bicarb 26, BUN 10, creatinine 0.87, serum glucose 178, troponin T <15 x 2, lactic acid 1.0.   SARS-CoV-2, influenza, RSV PCR negative.  Blood cultures and urinalysis ordered and pending collection.   CTA chest negative for PE.  New small bilateral pleural effusions with associated passive atelectasis in the lower lobes seen without pulmonary infarction, edema, or infection.  Mild pericardial thickening noted.   Patient was given IV morphine  4 mg x 2, IV Dilaudid  1 mg, 1 L LR, and IV ceftriaxone .  The hospitalist service was consulted for admission.  Significant Events: Admitted 01/11/2024 for SIRS/fever  Admission Labs: WBC 12.8, HgB 12.6, Plt 356 Na 136, K 4.2, CO2 of 26, BUN 10, Scr 0.87, glu 178 Lactic acid 1.0 Covid/Flu/RSV negative UA negative RVP negative TSH 1.160 ProBNP 1011 Procalcitonin 0.23 ESR 83 CRP 23.4  Admission Imaging Studies: CXR Decreased small left pleural effusion with adjacent airspace disease, favoring atelectasis. 2. Cardiomegaly with trace right pleural fluid. 3. Aortic atherosclerosis  (ICD10-I70.0). CTPA No evidence of pulmonary embolism. 2. New small bilateral pleural effusions with associated passive atelectasis in the lower lobes, without pulmonary infarction, edema, or infection. 3. Mild pericardial thickening.  Significant Labs: Cr trend: 0.87 >> 0.71 >> 0.85 >> 0.789 today WBC trend: 12.8 >> 14.1>> 15.5 >> 25.0 today (on IV steroids)  Significant Imaging Studies:      Assessment and Plan:  * Sepsis due to pneumonia (HCC) 01/12/2024 she meets sepsis criteria on admission.White count 12.8, 84% room air saturations, fever 101.2.  Due to pneumonia.  acute respiratory with hypoxia as the acute organ dysfunction. 1/7-->8/26 - sepsis physiology improved.  WBC remains elevated likely due to steroids. Clinically improving.  Immunosuppression due to drug therapy 01/14/2024 she is immunosuppressed on methotrexate  that she takes for her Sjogren's and seronegative rheumatoid arthritis.  This may be the reason why she failed outpatient treatment with a short course of p.o. antibiotics.  She was better when she was in the hospital and IV Rocephin . --Holding methotrexate .     Pleurisy with effusion 01/12/2024 patient with continued right sided pleuritic chest pain.  She has small bilateral pleural effusions left greater than right.  Cannot give her IV Toradol due to continued systemic anticoagulation with Eliquis  for her A-fib.  01/14/24 - pt has not required pain medication. Steroids seem to be helping. Pt able to take deep breaths again, no pain.  --Added lidocaine  patch --Continue oral morphine  7.5 mg p.o. every 4 hours as needed moderate or severe pain.   --Continue IV steroids to help with the pleuritic chest pain - Solu-Medrol  40 mg daily.   --Due to small b/l pleural effusion, given IV Lasix  20 mg daily -- will stop now that pt weaned to room air   Community acquired bilateral lower lobe pneumonia 01/14/2024 patient did get treatment with IV Rocephin  and p.o. Omnicef  while  she was admitted to the hospital in December.  Suspect due to her immunosuppressed status, the 7 days of antibiotics she received or probably not enough.  Agree with initial plan full week of IV Rocephin , or at least 5 days pending clinical course.   --Follow pending Legionella ag --Strep pneumo antigen negative --As needed albuterol  nebs.  --Day #4 of 7 of IV Rocephin  --Day #3 of 3 of IV Zithromax (pt has GI/N/V with PO Zithromax ).  Seronegative rheumatoid arthritis (HCC) 01/14/2024 Stable without active swelling of her joints.  She thinks that methotrexate  has helped her tremendously since starting it several months ago.   Paroxysmal atrial fibrillation (HCC) 01/12/2024 she remains in normal sinus rhythm by EKG on admission.   1/7>>08/2024 HR's controlled --Continue Eliquis  5 mg twice daily, amiodarone  200 mg daily, Lopressor  25 mg twice daily.   Acute respiratory failure with hypoxia [facility]) Patient admitted with room air saturations of 84%.  She has small bilateral pleural effusions and a history of seronegative rheumatoid arthritis and Sjogren syndrome.  She sees Dr. Kara with pulmonary critical care as an outpatient.  She does not normally use home oxygen.   01/13/24 - weaned off O2 to room air by afternoon 01/14/24 - remains stable  on RA --Supplement O2 PRN to maintain spO2 > 90% --Mgmt of underlying issues & treatment of pneumonia as outlined   Sjogren's disease 01/12/2024 treated as an outpatient with methotrexate  7.5 mg twice a day on Sundays.  I think part of her pulmonary process may be a flare of her Sjogren's.   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 No acute issues   Essential hypertension 01/14/2024 BP 160/75 this AM before medication, later 124/69 stable   --continue Lopressor  25 mg twice daily.   --Stopping IV lasix  today   --Holding Norvasc  for now, resume when BP warrants or Lasix  stopped   Acquired hypothyroidism 01/14/2024 stable. Normal TSH 1.16. --Continue Synthroid  100 mcg daily.        Patient Active Problem List   Diagnosis Date Noted   Community acquired bilateral lower lobe pneumonia 01/12/2024    Priority: High   Pleurisy with effusion 01/12/2024    Priority: High   Immunosuppression due to drug therapy 01/12/2024    Priority: High   Sepsis due to pneumonia (HCC) 12/15/2023    Priority: High   Paroxysmal atrial fibrillation (HCC) 01/12/2024    Priority: Medium    Seronegative rheumatoid arthritis (HCC) 01/12/2024    Priority: Medium    Acute respiratory failure with hypoxia (HCC) 12/15/2023    Priority: Medium    Sjogren's disease 01/22/2012    Priority: Medium    MGUS (monoclonal gammopathy of unknown significance) 08/27/2023    Priority: Low   Essential hypertension 03/19/2011    Priority: Low   Acquired hypothyroidism 02/26/2010    Priority: Low   Dysphagia 12/16/2023   Depression 07/13/2020   Thrush 11/03/2012   GERD (gastroesophageal reflux disease) 03/19/2011   Raynaud's disease 03/19/2011   Cardiomegaly 02/26/2010   Liver hemangioma 02/26/2010   Migraine 02/26/2010   Esophageal stricture 02/26/2010   Weight loss 02/26/2010   OTHER CHRONIC SINUSITIS 12/20/2007   Rosacea 12/20/2007      Subjective / Interval 24 hour History:   Pt seen virtually during AM medic visit to the home today.  She reports having no pain. Feels about 90% better.  Able to take deep breaths again without pain.  Appetite improving, did well eating yesterday.  She reports not tolerating prednisone  higher than 5 mg dose due to jitteriness and inability to sleep. She denies these issues with current IV steroid. Also does not tolerate oral antibiotics due to N/V even if taking them after food.  No other acute complaints at this  time.     Antibiotic Therapy: Anti-infectives (From admission, onward)    Start     Dose/Rate Route Frequency Ordered Stop   01/12/24 1800  cefTRIAXone  (ROCEPHIN ) 2 g in sodium chloride  0.9 % 100 mL IVPB  Status:  Discontinued        2 g 200 mL/hr over 30 Minutes Intravenous Every 24 hours 01/12/24 1014 01/12/24 1437   01/12/24 1600  cefTRIAXone  (ROCEPHIN ) 2 g in sodium chloride  0.9 % 100 mL IVPB        2 g 200 mL/hr over 30 Minutes Intravenous Every 24 hours 01/12/24 1437 01/16/24 0959   01/12/24 1600  azithromycin  (ZITHROMAX ) 500 mg in sodium chloride  0.9 % 250 mL IVPB       Note to Pharmacy: Allergy to azithro only nausea.  Hoping to avoid by avoiding the gut   500 mg 250 mL/hr over 60 Minutes Intravenous Every 24 hours 01/12/24 1515 01/14/24 1659   01/11/24 1915  cefTRIAXone  (ROCEPHIN ) 2 g in sodium chloride  0.9 % 100 mL IVPB        2 g 200 mL/hr over 30 Minutes Intravenous  Once 01/11/24 1903 01/11/24 1942       Procedures: None  Consultants: None          Physical Exam:    01/14/2024    3:55 PM 01/14/2024   10:02 AM 01/14/2024    8:47 AM  Vitals with BMI  Height   5' 4  Weight  147 lbs 8 oz 147 lbs  BMI  25.31 25.22  Systolic 124  160  Diastolic 69  75  Pulse 71  74     Bedside physical exam was performed by RN listed above. Below exam findings are based on their in person physical exam findings and my observations during virtual encounter.  General exam: awake, alert, no acute distress HEENT: Voice clear, hearing grossly normal  Respiratory system: CTAB, normal respiratory effort, on room air Cardiovascular system: normal S1/S2, RRR, no edema, symmetric 2+  radial pulses, cap refill < 3 sec Gastrointestinal system: soft, nontender nondistended Central nervous system: A&O x 4. no gross focal neurologic deficits, normal speech Extremities: moves all, no edema, normal tone Psychiatry: normal mood, congruent affect, judgement and insight appear  normal    Data Reviewed:  As reviewed above   Family Communication: None present during virtual   Disposition: Status is: Inpatient Remains inpatient appropriate because: Remains on IV therapies for pneumonia and pleurisy and weaning oxygen.  Requires further clinical improvement and extrended IV antibiotic coverage given her underlying immunosuppression.   Planned Discharge Destination: Home    Time spent: 45 minutes  Author: Burnard DELENA Cunning, DO [facility]  08/27/2023 7:00 AM  For on call review [email or web address].   "

## 2024-01-14 NOTE — Progress Notes (Signed)
 H@H  medic out to see patient for her first visit of the day. On arrival, I was welcomed in by the patient at the door. See flowsheet for assessment. VSS. The patient had no complaints today. IV was changed successfully. Meds were administered with oversight from RN. A video visit was conducted and completed with the virtual RN and provider. Labs were drawn as ordered and taken to [facility] by EMT Garrel. IV lasix  was held this morning pending lab results. No other tasks were needed at this time. Pt was left in care of herself with her husband by her side. H@H  visit complete.

## 2024-01-14 NOTE — Progress Notes (Addendum)
 Patient reports having a good night. Aox4. Denies pain and SOB. SpO2 98% on room air. No complaints at this time.  Per Dr. Fausto: Hold IV lasix  this morning

## 2024-01-15 ENCOUNTER — Other Ambulatory Visit (HOSPITAL_COMMUNITY): Payer: Self-pay

## 2024-01-15 LAB — C-REACTIVE PROTEIN: CRP: 6.9 mg/dL — ABNORMAL HIGH

## 2024-01-15 LAB — CBC
HCT: 34.8 % — ABNORMAL LOW (ref 36.0–46.0)
Hemoglobin: 11.5 g/dL — ABNORMAL LOW (ref 12.0–15.0)
MCH: 31.1 pg (ref 26.0–34.0)
MCHC: 33 g/dL (ref 30.0–36.0)
MCV: 94.1 fL (ref 80.0–100.0)
Platelets: 359 K/uL (ref 150–400)
RBC: 3.7 MIL/uL — ABNORMAL LOW (ref 3.87–5.11)
RDW: 14.1 % (ref 11.5–15.5)
WBC: 18.5 K/uL — ABNORMAL HIGH (ref 4.0–10.5)
nRBC: 0 % (ref 0.0–0.2)

## 2024-01-15 LAB — LEGIONELLA PNEUMOPHILA SEROGP 1 UR AG: L. pneumophila Serogp 1 Ur Ag: NEGATIVE

## 2024-01-15 LAB — SEDIMENTATION RATE: Sed Rate: 60 mm/h — ABNORMAL HIGH (ref 0–22)

## 2024-01-15 MED ORDER — METHYLPREDNISOLONE SODIUM SUCC 40 MG IJ SOLR
20.0000 mg | Freq: Every day | INTRAMUSCULAR | Status: DC
  Administered 2024-01-15 – 2024-01-16 (×2): 20 mg via INTRAVENOUS
  Filled 2024-01-15 (×2): qty 0.5

## 2024-01-15 MED ORDER — DICYCLOMINE HCL 10 MG PO CAPS
10.0000 mg | ORAL_CAPSULE | Freq: Three times a day (TID) | ORAL | 0 refills | Status: AC | PRN
  Filled 2024-01-15: qty 30, 10d supply, fill #0

## 2024-01-15 MED ORDER — PREDNISONE 5 MG PO TABS: 5.0000 mg | ORAL_TABLET | Freq: Every day | ORAL | Status: AC

## 2024-01-15 MED ORDER — CEFUROXIME AXETIL 250 MG PO TABS
500.0000 mg | ORAL_TABLET | Freq: Two times a day (BID) | ORAL | Status: DC
  Administered 2024-01-15 – 2024-01-16 (×2): 500 mg via ORAL
  Filled 2024-01-15 (×5): qty 2

## 2024-01-15 MED ORDER — CEFUROXIME AXETIL 500 MG PO TABS
500.0000 mg | ORAL_TABLET | Freq: Two times a day (BID) | ORAL | 0 refills | Status: AC
  Filled 2024-01-15: qty 5, 3d supply, fill #0

## 2024-01-15 MED ORDER — ONDANSETRON 4 MG PO TBDP
4.0000 mg | ORAL_TABLET | Freq: Two times a day (BID) | ORAL | 0 refills | Status: AC
  Filled 2024-01-15: qty 20, 18d supply, fill #0

## 2024-01-15 MED ORDER — DICYCLOMINE HCL 10 MG PO CAPS
10.0000 mg | ORAL_CAPSULE | Freq: Three times a day (TID) | ORAL | Status: DC | PRN
  Filled 2024-01-15: qty 1

## 2024-01-15 MED ORDER — ONDANSETRON 4 MG PO TBDP
4.0000 mg | ORAL_TABLET | Freq: Two times a day (BID) | ORAL | Status: DC
  Administered 2024-01-15 – 2024-01-16 (×2): 4 mg via ORAL
  Filled 2024-01-15 (×5): qty 1

## 2024-01-15 NOTE — Progress Notes (Signed)
 Spoke with patient via video chat. Alert and oriented,stated she feels good. RN supervised am medication administration. EMT unable to perform successful venipuncture. EMT is transporting patient to [facility] lab for venipucture; lab is aware.

## 2024-01-15 NOTE — Progress Notes (Signed)
 I called pt to assess her status after taking po antibiotic. She states she doesn't feel any abdominal pain,but sometimes can take a few days. She states she does have some new bilateral lower back pain that started this am and has increasingly worsened. She has not had this pain before. She does not have any discomfort or pain urinating,but she feels like she is not voiding enough compared to what she is drinking. She does not feel full or like she isn't emptying. Will discuss with MD at 4pm IDR.Pt aware of plan and agreeable.

## 2024-01-15 NOTE — Plan of Care (Signed)
  Problem: Education: Goal: Knowledge of General Education information will improve Description: Including pain rating scale, medication(s)/side effects and non-pharmacologic comfort measures Outcome: Progressing   Problem: Clinical Measurements: Goal: Ability to maintain clinical measurements within normal limits will improve Outcome: Progressing Goal: Will remain free from infection Outcome: Progressing Goal: Diagnostic test results will improve Outcome: Progressing Goal: Respiratory complications will improve Outcome: Progressing Goal: Cardiovascular complication will be avoided Outcome: Progressing   Problem: Activity: Goal: Risk for activity intolerance will decrease Outcome: Progressing   Problem: Nutrition: Goal: Adequate nutrition will be maintained Outcome: Progressing   Problem: Elimination: Goal: Will not experience complications related to bowel motility Outcome: Progressing Goal: Will not experience complications related to urinary retention Outcome: Progressing   Problem: Pain Managment: Goal: General experience of comfort will improve and/or be controlled Outcome: Progressing   Problem: Safety: Goal: Ability to remain free from injury will improve Outcome: Progressing   Problem: Skin Integrity: Goal: Risk for impaired skin integrity will decrease Outcome: Progressing

## 2024-01-15 NOTE — Plan of Care (Signed)
  Problem: Education: Goal: Knowledge of General Education information will improve Description: Including pain rating scale, medication(s)/side effects and non-pharmacologic comfort measures Outcome: Progressing   Problem: Health Behavior/Discharge Planning: Goal: Ability to manage health-related needs will improve Outcome: Progressing   Problem: Clinical Measurements: Goal: Ability to maintain clinical measurements within normal limits will improve Outcome: Progressing Goal: Will remain free from infection Outcome: Progressing Goal: Diagnostic test results will improve Outcome: Progressing Goal: Respiratory complications will improve Outcome: Progressing Goal: Cardiovascular complication will be avoided Outcome: Progressing   Problem: Activity: Goal: Risk for activity intolerance will decrease Outcome: Progressing   Problem: Nutrition: Goal: Adequate nutrition will be maintained Outcome: Progressing   Problem: Coping: Goal: Level of anxiety will decrease Outcome: Progressing   Problem: Elimination: Goal: Will not experience complications related to bowel motility Outcome: Progressing Goal: Will not experience complications related to urinary retention Outcome: Progressing   Problem: Pain Managment: Goal: General experience of comfort will improve and/or be controlled Outcome: Progressing   Problem: Safety: Goal: Ability to remain free from injury will improve Outcome: Progressing   Problem: Skin Integrity: Goal: Risk for impaired skin integrity will decrease Outcome: Progressing   Problem: Activity: Goal: Ability to tolerate increased activity will improve Outcome: Progressing   Problem: Clinical Measurements: Goal: Ability to maintain a body temperature in the normal range will improve Outcome: Progressing

## 2024-01-15 NOTE — Progress Notes (Signed)
 "  Hospital at Home Daily Progress Note   Patient: Christy Moore  MRN: [id number]  DOB: [birth year]  DOA: 01/11/2024  DOS: the patient was seen and examined on @TODAY @    Patient identified themself as Christy Moore  DOB 02-13-1945  Medic Consuelo Kerns present in the home during encounter and performed the assessment and physical exam. Patient was seen today via video conference; my physical location [place], [place]    Brief hospital course:  CC: chest pain for 1.5 months HPI: Christy Moore is a 79 y.o. female with medical history significant for PAF on Eliquis  and amiodarone , bifascicular block, Sjogren syndrome, rheumatoid arthritis, mild ILD with bronchiectasis, HTN, hypothyroidism, esophageal stricture s/p dilation, MGUS who presented to the ED for evaluation of chest pain.   Patient recently admitted from 12/15/2023-12/18/2023.  Initially presented with atypical chest pain and mild hypoxia.  She met SIRS criteria.  She had negative infectious workup.  During admission she went to atrial flutter with RVR.  She was given metoprolol  and converted back to sinus rhythm but did have a 17-second pause.  She was seen by cardiology.  It was felt that A-fib/flutter was triggered by infection.  She has been started on Eliquis , metoprolol , and amiodarone .   Patient returns with musculoskeletal pain throughout her thorax.  She says pain is currently on the right side of her chest and feels like muscle soreness.  She says mostly the pain has been from her hips up to her mid back.  She is also having pain above her right clavicle.  She has chronic respiratory issues with bronchiectasis and states that she has never been able to take deep breaths.  She reports appetite has been low for several weeks and she has had an unintentional 5 pound weight loss.  She reports early satiety without dysphagia.   She has had some shaking chills without diaphoresis.  She has not really had a cough.  She had an episode of  nausea and vomiting on arrival to the floor but no other recent episodes.  She has not had abdominal pain.  She reports good urine output without dysuria.  She has not had any diarrhea.  She has not had any swelling in her lower extremities.  She denies any skin changes or rash.  No obvious bug, tick, insect bites.   She follows with rheumatology, Dr. Dolphus, for Sjogren syndrome and seronegative rheumatoid arthritis and has been on methotrexate  which she takes every Sunday.  Patient states she has not had any issues with joint pain or swelling.   [facility] ED Course  Labs/Imaging on admission: I have personally reviewed following labs and imaging studies.   Initial vitals showed BP 158/73, pulse 86, RR 20, temp 98.8 F, SpO2 99% on room air.  Tmax 101.2 F rectally.   Labs showed WBC 12.8, hemoglobin 12.6, platelets 356, sodium 136, potassium 4.2, bicarb 26, BUN 10, creatinine 0.87, serum glucose 178, troponin T <15 x 2, lactic acid 1.0.   SARS-CoV-2, influenza, RSV PCR negative.  Blood cultures and urinalysis ordered and pending collection.   CTA chest negative for PE.  New small bilateral pleural effusions with associated passive atelectasis in the lower lobes seen without pulmonary infarction, edema, or infection.  Mild pericardial thickening noted.   Patient was given IV morphine  4 mg x 2, IV Dilaudid  1 mg, 1 L LR, and IV ceftriaxone .  The hospitalist service was consulted for admission.  Significant Events: Admitted 01/11/2024 for SIRS/fever  Admission Labs: WBC 12.8, HgB 12.6, Plt 356 Na 136, K 4.2, CO2 of 26, BUN 10, Scr 0.87, glu 178 Lactic acid 1.0 Covid/Flu/RSV negative UA negative RVP negative TSH 1.160 ProBNP 1011 Procalcitonin 0.23 ESR 83 CRP 23.4  Admission Imaging Studies: CXR Decreased small left pleural effusion with adjacent airspace disease, favoring atelectasis. 2. Cardiomegaly with trace right pleural fluid. 3. Aortic atherosclerosis  (ICD10-I70.0). CTPA No evidence of pulmonary embolism. 2. New small bilateral pleural effusions with associated passive atelectasis in the lower lobes, without pulmonary infarction, edema, or infection. 3. Mild pericardial thickening.  Significant Labs: Cr trend: 0.87 >> 0.71 >> 0.85 >> 0.789 today WBC trend: 12.8 >> 14.1>> 15.5 >> 25.0 today (on IV steroids)  Significant Imaging Studies:      Assessment and Plan:  * Sepsis due to pneumonia (HCC) 01/12/2024 she meets sepsis criteria on admission.White count 12.8, 84% room air saturations, fever 101.2.  Due to pneumonia.  acute respiratory with hypoxia as the acute organ dysfunction. 1/7-->9/26 - sepsis physiology improved.  WBC remains elevated likely due to steroids. Clinically improving.  Immunosuppression due to drug therapy 01/15/2024 she is immunosuppressed on methotrexate  that she takes for her Sjogren's and seronegative rheumatoid arthritis.  This may be the reason why she failed outpatient treatment with a short course of p.o. antibiotics.  She was better when she was in the hospital and IV Rocephin . --Holding methotrexate .     Pleurisy with effusion 01/12/2024 patient with continued right sided pleuritic chest pain.  She has small bilateral pleural effusions left greater than right.  Cannot give her IV Toradol due to continued systemic anticoagulation with Eliquis  for her A-fib.  1/8>>9/26 - pt has not required pain medication. Steroids seem to be helping. Pt able to take deep breaths again, no pain.  --Added lidocaine  patch --Continue oral morphine  7.5 mg p.o. every 4 hours as needed moderate or severe pain.   --Continue IV steroids to help with the pleuritic chest pain - Solu-Medrol  40 >> reduce to 20 mg daily.   --Due to small b/l pleural effusion, given IV Lasix  20 mg daily -- will stop now that pt weaned to room air   Community acquired bilateral lower lobe pneumonia 01/15/2024 patient did get treatment with IV Rocephin  and  p.o. Omnicef  while she was admitted to the hospital in December.  Suspect due to her immunosuppressed status, the 7 days of antibiotics she received or probably not enough.  Agree with initial plan full week of IV Rocephin , or at least 5 days pending clinical course.   --Follow pending Legionella ag --Strep pneumo antigen negative --As needed albuterol  nebs.  --s/p 4 days IV Rocephin  --Transition to PO Ceftin  with ODT Zofran  x 3 more days --Completed 3 days IV Zithromax (pt has GI/N/V with PO Zithromax ).  Seronegative rheumatoid arthritis (HCC) 01/15/2024 Stable without active swelling of her joints.  She thinks that methotrexate  has helped her tremendously since starting it several months ago.   Paroxysmal atrial fibrillation (HCC) 01/12/2024 she remains in normal sinus rhythm by EKG on admission.   1/7>>09/2024 HR's controlled --Continue Eliquis  5 mg twice daily, amiodarone  200 mg daily, Lopressor  25 mg twice daily.   Acute respiratory failure with hypoxia [facility]) Patient admitted with room air saturations of 84%.  She has small bilateral pleural effusions and a history of seronegative rheumatoid arthritis and Sjogren syndrome.  She sees Dr. Kara with pulmonary critical care as an outpatient.  She does not normally use home oxygen.   01/13/24 - weaned  off O2 to room air by afternoon 1/8>>9/26 - remains stable on RA --Supplement O2 PRN to maintain spO2 > 90% --Mgmt of underlying issues & treatment of pneumonia as outlined   Sjogren's disease 01/12/2024 treated as an outpatient with methotrexate  7.5 mg twice a day on "Sundays.  I think part of her pulmonary process may be a flare of her Sjogren's.   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 No acute issues   Essential  hypertension 01/15/2024 BP 136/71 stable --continue Lopressor 25 mg twice daily.   --Stopped IV lasix (last 1/7)   --Holding Norvasc for now, resume when BP warrants or Lasix stopped   Acquired hypothyroidism 01/15/2024 stable. Normal TSH 1.16. --Continue Synthroid 100 mcg daily.        Patient Active Problem List   Diagnosis Date Noted   Community acquired bilateral lower lobe pneumonia 01/12/2024    Priority: High   Pleurisy with effusion 01/12/2024    Priority: High   Immunosuppression due to drug therapy 01/12/2024    Priority: High   Sepsis due to pneumonia (HCC) 12/15/2023    Priority: High   Paroxysmal atrial fibrillation (HCC) 01/12/2024    Priority: Medium    Seronegative rheumatoid arthritis (HCC) 01/12/2024    Priority: Medium    Acute respiratory failure with hypoxia (HCC) 12/15/2023    Priority: Medium    Sjogren's disease 01/22/2012    Priority: Medium    MGUS (monoclonal gammopathy of unknown significance) 08/27/2023    Priority: Low   Essential hypertension 03/19/2011    Priority: Low   Acquired hypothyroidism 02/26/2010    Priority: Low   Dysphagia 12/16/2023   Depression 07/13/2020   Thrush 11/03/2012   GERD (gastroesophageal reflux disease) 03/19/2011   Raynaud's disease 03/19/2011   Cardiomegaly 02/26/2010   Liver hemangioma 02/26/2010   Migraine 02/26/2010   Esophageal stricture 02/26/2010   Weight loss 02/26/2010   OTHER CHRONIC SINUSITIS 12/20/2007   Rosacea 12/20/2007      Subjective / Interval 24 hour History:   Pt seen virtually during Medic visit to the home today.  She reports overall feeling well.  Has a slight scrathy throat today, since having a citrus italian ice yesterday evening.  No congestion or worsening cough no fever or chills.  Denies recurrence of the right sided pain she was having on admission.  Had two BM's today which is her usual.  No other acute complaints at this time.  Pt agreeable to do oral antibiotic challenge  to see how she toelrates, with nausea and other Prn medication in case of side effects.    This evening, patient having new onset bilateral lower back pain.  No injury. No dysuria or hematuria, voiding without issues.   Antibiotic Therapy: Anti-infectives (From admission, onward)    Start     Dose/Rate Route Frequency Ordered Stop   01/15/24 1300  cefUROXime (CEFTIN) tablet 500 mg       Placed in And Linked Group   500 mg Oral 2 times daily 01/15/24 1105     01/15/24 0000  cefUROXime (CEFTIN) 500 MG tablet        50" 0 mg Oral 2 times daily 01/15/24 1552 01/18/24 2359   01/12/24 1800  cefTRIAXone  (ROCEPHIN ) 2 g in sodium chloride  0.9 % 100 mL IVPB  Status:  Discontinued        2 g 200 mL/hr over 30 Minutes Intravenous Every 24 hours 01/12/24 1014 01/12/24 1437   01/12/24 1600  cefTRIAXone  (ROCEPHIN ) 2 g in sodium chloride  0.9 % 100 mL IVPB        2 g 200 mL/hr over 30 Minutes Intravenous Every 24 hours 01/12/24 1437 01/16/24 0959   01/12/24 1600  azithromycin  (ZITHROMAX ) 500 mg in sodium chloride  0.9 % 250 mL IVPB       Note to Pharmacy: Allergy to azithro only nausea.  Hoping to avoid by avoiding the gut   500 mg 250 mL/hr over 60 Minutes Intravenous Every 24 hours 01/12/24 1515 01/14/24 1659   01/11/24 1915  cefTRIAXone  (ROCEPHIN ) 2 g in sodium chloride  0.9 % 100 mL IVPB        2 g 200 mL/hr over 30 Minutes Intravenous  Once 01/11/24 1903 01/11/24 1942       Procedures: None  Consultants: None    Physical Exam:    01/15/2024   12:00 PM 01/15/2024    9:43 AM 01/15/2024    6:16 AM  Vitals with BMI  Systolic 136 136   Diastolic 72 71   Pulse 60 67 67     Bedside physical exam was performed by RN listed above. Below exam findings are based on their in person physical exam findings and my observations during virtual encounter.  General exam: awake, alert, no acute distress HEENT: Voice clear, hearing grossly normal  Respiratory system: CTAB, normal respiratory effort,  on room air Cardiovascular system: normal S1/S2, RRR Gastrointestinal system: active bowel sounds, soft non-tender Central nervous system: A&O x 4. no gross focal neurologic deficits, normal speech Extremities: moves all, no edema, normal tone Psychiatry: normal mood, congruent affect, judgement and insight appear normal    Data Reviewed:  As reviewed above   Family Communication: None present during virtual   Disposition: Status is: Inpatient Remains inpatient appropriate because: Remains on IV therapies for pneumonia and pleurisy and weaning oxygen.  Requires further clinical improvement and extrended IV antibiotic coverage given her underlying immunosuppression.   Planned Discharge Destination: Home    Time spent: 45 minutes  Author: Burnard DELENA Cunning, DO [facility]  08/27/2023 7:00 AM  For on call review [email or web address]   "

## 2024-01-15 NOTE — TOC Transition Note (Signed)
 Transition of Care [facility]) - Discharge Note   Patient Details  Name: Christy Moore MRN: [id number] Date of Birth: 1945/06/22  [facility] CM/SW Contact:  Debarah Saunas, RN Phone Number: 01/15/2024, 8:50 AM   Clinical Narrative:    Pt for discharge today.  No ICM needs identified.     Final next level of care: Home/Self Care Barriers to Discharge: Barriers Resolved   Patient Goals and CMS Choice Patient states their goals for this hospitalization and ongoing recovery are:: I feel so much better. I want to stay that way!                                              Social Drivers of Health (SDOH) Interventions SDOH Screenings   Food Insecurity: No Food Insecurity (01/12/2024)  Housing: Low Risk (01/12/2024)  Transportation Needs: No Transportation Needs (01/12/2024)  Utilities: Not At Risk (01/12/2024)  Alcohol Screen: Low Risk (11/05/2022)  Depression (PHQ2-9): Low Risk (12/21/2023)  Financial Resource Strain: Low Risk (11/19/2022)  Physical Activity: Insufficiently Active (11/19/2022)  Social Connections: Socially Integrated (01/12/2024)  Stress: No Stress Concern Present (11/19/2022)  Tobacco Use: Low Risk (01/11/2024)  Health Literacy: Adequate Health Literacy (11/05/2022)     Readmission Risk Interventions    01/15/2024    8:50 AM 12/17/2023    2:59 PM  Readmission Risk Prevention Plan  Post Dischage Appt Complete Complete  Medication Screening Complete Complete  Transportation Screening Complete Complete

## 2024-01-15 NOTE — Progress Notes (Signed)
 Arrived to patient   she is alert and oriented   her blood work has already been drawn prior to my arrival  her vitals are stable  all other follow chart info is already documented   she has a complaint of a dry itchy throat  lung sounds are clear and bilateral   she and Dr Fausto are having there virtual and discussing what they plan to do with her use of antibiotics   possible discharge later today pending blood work

## 2024-01-15 NOTE — Progress Notes (Signed)
 IMM signed at 09:33 January 9th 2026

## 2024-01-15 NOTE — Progress Notes (Signed)
 Brought PO antibiotics zofran  and bentyl  to patient to try and see how she does with them this afternoon   patient has new complaint of lower back/kidney pain    she is asked if she would like to use a lidocain patch on it and she said it wasn't muscle pain

## 2024-01-15 NOTE — Plan of Care (Signed)
" °  Problem: Education: Goal: Knowledge of General Education information will improve Description: Including pain rating scale, medication(s)/side effects and non-pharmacologic comfort measures Outcome: Progressing   Problem: Health Behavior/Discharge Planning: Goal: Ability to manage health-related needs will improve Outcome: Progressing   Problem: Clinical Measurements: Goal: Ability to maintain clinical measurements within normal limits will improve Outcome: Progressing   Problem: Coping: Goal: Level of anxiety will decrease Outcome: Progressing   Problem: Elimination: Goal: Will not experience complications related to bowel motility Outcome: Progressing   Problem: Pain Managment: Goal: General experience of comfort will improve and/or be controlled Outcome: Progressing   Problem: Safety: Goal: Ability to remain free from injury will improve Outcome: Progressing   Problem: Activity: Goal: Ability to tolerate increased activity will improve Outcome: Progressing   Problem: Clinical Measurements: Goal: Ability to maintain a body temperature in the normal range will improve Outcome: Progressing   Problem: Respiratory: Goal: Ability to maintain adequate ventilation will improve Outcome: Progressing Goal: Ability to maintain a clear airway will improve Outcome: Progressing   "

## 2024-01-15 NOTE — Progress Notes (Signed)
 Phone call completed with patient. Introduced myself as the [facility]ER for the night and encouraged patient to call via phone or tablet if needs arise. Patient endorses some low back pain tonight but declines to take any prn pain medication for it. She states that it could be kidney related and she's trying to flush it out. No other needs or concerns at this time. Patient will call RN back when she is ready to take her bedtime medications.

## 2024-01-15 NOTE — Progress Notes (Signed)
 Completed virtual rounds with MD,paramedic at patient bedside. POC reviewed and discussed ,patient voices understanding and agreement. Pt reminded to call RN for any needs, RN and MD available at all times. Pt voices understanding. Pt aware of next planned visit and next call from RN.

## 2024-01-15 NOTE — Progress Notes (Signed)
 0717-Video call completed for medication administration. No additional needs reported or identified, all questions answered. Patient informed on coming RN will reach out prior to paramedic visits today.

## 2024-01-15 NOTE — Progress Notes (Signed)
 Virtual RN Jonne stated that the pt can be taken to [facility] for a lab draw. The pt ambulated outside to the [facility] and was guided to the front passenger seat. The pt was restrained via chest and lap belt. No incidents to report during transport. Upon arrival to [facility] the pt ambulated into the ER and was directed to a WC. She was then wheeled to the lab. Labs were drawn by  [facility] and sent to the lab for testing. The pt was then wheeled back out to the [facility]. She was directed to the front seat and was secured with a chest and lap belt. The pt was then taken back home. No incidents to report during transport. The pt ambulated back into her home. Team made aware that pt is back home and pt was advised to reach out to staff if she needed anything. PT also made aware that [facility] would be arriving shortly for morning visit!

## 2024-01-15 NOTE — Progress Notes (Signed)
 This EMT went to the pt's home to obtain lab work. Pt greeted me at the door and was ambulatory. She then sat of her couch and labs were attempted. X1 attempt in the RIGHT AC. No labs were able to be obtained at this time. Pt stated that she is usually an ultrasound stick. Virtual RN Jonne made aware at this time. Vitals were obtained and assistance with morning med administration was done and watched by RN Jonne.

## 2024-01-15 NOTE — Progress Notes (Signed)
 Arrived to patient   she is alert and oriented  she states that she has urinated twice without pressure   her vitals are stable    she has no other complaints at this time   she will call in for her bedtime meds   she is advised to call if she needs anything else before our visit tomorrow morning

## 2024-01-16 ENCOUNTER — Other Ambulatory Visit (HOSPITAL_COMMUNITY): Payer: Self-pay

## 2024-01-16 LAB — CBC
HCT: 35.2 % — ABNORMAL LOW (ref 36.0–46.0)
Hemoglobin: 11.5 g/dL — ABNORMAL LOW (ref 12.0–15.0)
MCH: 30.8 pg (ref 26.0–34.0)
MCHC: 32.7 g/dL (ref 30.0–36.0)
MCV: 94.4 fL (ref 80.0–100.0)
Platelets: 359 K/uL (ref 150–400)
RBC: 3.73 MIL/uL — ABNORMAL LOW (ref 3.87–5.11)
RDW: 14.2 % (ref 11.5–15.5)
WBC: 13.8 K/uL — ABNORMAL HIGH (ref 4.0–10.5)
nRBC: 0 % (ref 0.0–0.2)

## 2024-01-16 MED ORDER — LIDOCAINE 5 % EX OINT
1.0000 | TOPICAL_OINTMENT | CUTANEOUS | 0 refills | Status: AC | PRN
  Filled 2024-01-16: qty 35.44, fill #0

## 2024-01-16 MED ORDER — LIDOCAINE 5 % EX PTCH
1.0000 | MEDICATED_PATCH | CUTANEOUS | 0 refills | Status: DC
  Filled 2024-01-16: qty 30, 30d supply, fill #0

## 2024-01-16 MED ORDER — FAMOTIDINE 20 MG PO TABS
20.0000 mg | ORAL_TABLET | Freq: Every day | ORAL | 0 refills | Status: AC | PRN
  Filled 2024-01-16: qty 20, 20d supply, fill #0

## 2024-01-16 NOTE — Progress Notes (Signed)
 On camera with patient to take am meds. Pt reports she has had significant output of urine overnight. She reports no more back pain since diuresis. EMT unsuccessful at lab stick . Transporting to [facility].

## 2024-01-16 NOTE — Discharge Summary (Signed)
 "  [facility] Physician Discharge Summary   Patient: Christy Moore MRN: [id number] DOB: Feb 26, 1945  Admit date:     01/11/2024  Discharge date: 01/16/2024  Discharge Physician: Burnard DELENA Cunning   PCP: Micheal Wolm LELON, MD   Patient identified themself as Christy Moore  DOB 12-26-1945  Medic Barnie Bud present in the home during encounter and performed the physical exam and assessment. Patient was seen today via video chat; my physical location [facility] [place]  Recommendations at discharge:   Follow up with Primary Care in 1-2 weeks Follow up with Pulmonology as scheduled Follow up with Rheumatology as scheduled Discuss with Rheumatology when to resume methotrexate  Repeat CBC, CMP at follow up  Discharge Diagnoses: Principal Problem:   Sepsis due to pneumonia [facility]) Active Problems:   Community acquired bilateral lower lobe pneumonia   Pleurisy with effusion   Immunosuppression due to drug therapy   Sjogren's disease   Paroxysmal atrial fibrillation (HCC)   Seronegative rheumatoid arthritis (HCC)   Acquired hypothyroidism   Essential hypertension   MGUS (monoclonal gammopathy of unknown significance)  Resolved Problems:   Acute respiratory failure with hypoxia [facility])  Hospital Course: CC: chest pain for 1.5 months HPI: Christy Moore is a 79 y.o. female with medical history significant for PAF on Eliquis  and amiodarone , bifascicular block, Sjogren syndrome, rheumatoid arthritis, mild ILD with bronchiectasis, HTN, hypothyroidism, esophageal stricture s/p dilation, MGUS who presented to the ED for evaluation of chest pain.   Patient recently admitted from 12/15/2023-12/18/2023.  Initially presented with atypical chest pain and mild hypoxia.  She met SIRS criteria.  She had negative infectious workup.  During admission she went to atrial flutter with RVR.  She was given metoprolol  and converted back to sinus rhythm but did have a 17-second pause.  She was seen by  cardiology.  It was felt that A-fib/flutter was triggered by infection.  She has been started on Eliquis , metoprolol , and amiodarone .   Patient returns with musculoskeletal pain throughout her thorax.  She says pain is currently on the right side of her chest and feels like muscle soreness.  She says mostly the pain has been from her hips up to her mid back.  She is also having pain above her right clavicle.  She has chronic respiratory issues with bronchiectasis and states that she has never been able to take deep breaths.  She reports appetite has been low for several weeks and she has had an unintentional 5 pound weight loss.  She reports early satiety without dysphagia.   She has had some shaking chills without diaphoresis.  She has not really had a cough.  She had an episode of nausea and vomiting on arrival to the floor but no other recent episodes.  She has not had abdominal pain.  She reports good urine output without dysuria.  She has not had any diarrhea.  She has not had any swelling in her lower extremities.  She denies any skin changes or rash.  No obvious bug, tick, insect bites.   She follows with rheumatology, Dr. Dolphus, for Sjogren syndrome and seronegative rheumatoid arthritis and has been on methotrexate  which she takes every Sunday.  Patient states she has not had any issues with joint pain or swelling.   [facility] ED Course  Labs/Imaging on admission: I have personally reviewed following labs and imaging studies.   Initial vitals showed BP 158/73, pulse 86, RR 20, temp 98.8 F, SpO2 99% on room air.  Tmax 101.2 F rectally.   Labs showed WBC 12.8, hemoglobin 12.6, platelets 356, sodium 136, potassium 4.2, bicarb 26, BUN 10, creatinine 0.87, serum glucose 178, troponin T <15 x 2, lactic acid 1.0.   SARS-CoV-2, influenza, RSV PCR negative.  Blood cultures and urinalysis ordered and pending collection.   CTA chest negative for PE.  New small bilateral pleural  effusions with associated passive atelectasis in the lower lobes seen without pulmonary infarction, edema, or infection.  Mild pericardial thickening noted.   Patient was given IV morphine  4 mg x 2, IV Dilaudid  1 mg, 1 L LR, and IV ceftriaxone .  The hospitalist service was consulted for admission.  Significant Events: Admitted 01/11/2024 for SIRS/fever   Admission Labs: WBC 12.8, HgB 12.6, Plt 356 Na 136, K 4.2, CO2 of 26, BUN 10, Scr 0.87, glu 178 Lactic acid 1.0 Covid/Flu/RSV negative UA negative RVP negative TSH 1.160 ProBNP 1011 Procalcitonin 0.23 ESR 83 CRP 23.4  Admission Imaging Studies: CXR Decreased small left pleural effusion with adjacent airspace disease, favoring atelectasis. 2. Cardiomegaly with trace right pleural fluid. 3. Aortic atherosclerosis (ICD10-I70.0). CTPA No evidence of pulmonary embolism. 2. New small bilateral pleural effusions with associated passive atelectasis in the lower lobes, without pulmonary infarction, edema, or infection. 3. Mild pericardial thickening.  Significant Labs: Cr trend: 0.87 >> 0.71 >> 0.85 >> 0.789 today WBC trend: 12.8 >> 14.1>> 15.5 >> 25.0 today (on IV steroids)  Significant Imaging Studies:    01/16/24 -- pt feeling well today, no chest pain.  No acute complaints.  Patient medically stable for discharge today.   Assessment and Plan: * Sepsis due to pneumonia (HCC) 01/12/2024 she meets sepsis criteria on admission.White count 12.8, 84% room air saturations, fever 101.2.  Due to pneumonia.  acute respiratory with hypoxia as the acute organ dysfunction. 1/7-->10/26 - sepsis physiology improved.  WBC remains elevated likely due to steroids. Clinically improving.  Immunosuppression due to drug therapy 01/16/2024 she is immunosuppressed on methotrexate  that she takes for her Sjogren's and seronegative rheumatoid arthritis.  This may be the reason why she failed outpatient treatment with a short course of p.o. antibiotics.  She was  better when she was in the hospital and IV Rocephin . --Holding methotrexate  - follow up with rheumatology for when to resume --On Prednisone  5 mg daily   Pleurisy with effusion 01/12/2024 patient with continued right sided pleuritic chest pain.  She has small bilateral pleural effusions left greater than right.  Cannot give her IV Toradol due to continued systemic anticoagulation with Eliquis  for her A-fib.  1/8>>10/26 - pt has not required pain medication. Steroids seem to be helping. Pt able to take deep breaths again, no pain.  --Added lidocaine  patch --Continue oral morphine  7.5 mg p.o. every 4 hours as needed moderate or severe pain.   --Continue IV steroids to help with the pleuritic chest pain - Solu-Medrol  40 >> reduce to 20 mg daily.   --Due to small b/l pleural effusion, given IV Lasix  20 mg daily -- will stop now that pt weaned to room air   Community acquired bilateral lower lobe pneumonia 01/15/2024 patient did get treatment with IV Rocephin  and p.o. Omnicef  while she was admitted to the hospital in December.  Suspect due to her immunosuppressed status, the 7 days of antibiotics she received or probably not enough.  Agree with initial plan full week of IV Rocephin , or at least 5 days pending clinical course.   --Follow pending Legionella ag --Strep pneumo antigen negative --As  needed albuterol  nebs.  --s/p 5 days IV Rocephin  --Transition to PO Ceftin  with ODT Zofran  x 3 more days --Completed 3 days IV Zithromax   1/10 - discharge on PO Ceftin  to complete 7 day course of antibiotics (5 doses starting this evening)  Seronegative rheumatoid arthritis (HCC) 01/16/2024 Stable without active swelling of her joints.  She thinks that methotrexate  has helped her tremendously since starting it several months ago.   Paroxysmal atrial fibrillation (HCC) 01/12/2024 she remains in normal sinus rhythm by EKG on admission.   1/7>>10/2024 HR's controlled --Continue Eliquis  5 mg twice daily,  amiodarone  200 mg daily, Lopressor  25 mg twice daily.   Sjogren's disease 01/12/2024 treated as an outpatient with methotrexate  7.5 mg twice a day on Sundays.  I think part of her pulmonary process may be a flare of her Sjogren's.   1/7-->10/26 --IV Solu-Medrol  40 mg once daily for pleuritic chest pain  --Resume PO prednisone  5 mg daily on discharge   Acute respiratory failure with hypoxia (HCC)-resolved as of 01/23/2024 Patient admitted with room air saturations of 84%.  She has small bilateral pleural effusions and a history of seronegative rheumatoid arthritis and Sjogren syndrome.  She sees Dr. Kara with pulmonary critical care as an outpatient.  She does not normally use home oxygen.   01/13/24 - weaned off O2 to room air by afternoon 1/8>>9/26 - remains stable on RA --Supplement O2 PRN to maintain spO2 > 90% --Mgmt of underlying issues & treatment of pneumonia as outlined   MGUS (monoclonal gammopathy of unknown significance) 01/15/2024 chronic. No acute issues   Essential hypertension BP 136/71 --continue Lopressor  25 mg twice daily.   --Hold Norvasc  on d/c to avoid hypotension --Stopped IV lasix  (last 1/7)   --Follow up with PCP   Acquired hypothyroidism Normal TSH 1.16. --Continue Synthroid  100 mcg daily.            Consultants: none Procedures performed: none  Disposition: Home Diet recommendation:  Regular diet DISCHARGE MEDICATION: Allergies as of 01/16/2024       Reactions   Protonix  [pantoprazole ] Other (See Comments)   Terrible stomach cramps   Albuterol -budesonide Other (See Comments)   Burned her tongue   Amoxicillin -pot Clavulanate Nausea Only   Azithromycin  Nausea Only   Broccoli [brassica Oleracea] Other (See Comments)   RAW only    Celecoxib Nausea Only   Ciprofloxacin Nausea Only   Doxycycline  Other (See Comments)    gi upset   Erythromycin Nausea Only   Hydrocodone Nausea Only   Hydrocodone-acetaminophen  Other (See Comments)   GI upset    Levofloxacin Other (See Comments)   she claims renal failiure   Omeprazole  Nausea And Vomiting   Penicillins Other (See Comments)   Gi upset    Propoxyphene N-acetaminophen  Other (See Comments)   Unknown per pt   Rofecoxib Nausea Only   Sulfa Antibiotics Other (See Comments)   Gi upset    Tetanus Toxoid Rash        Medication List     PAUSE taking these medications    amLODipine  5 MG tablet Wait to take this until your doctor or other care provider tells you to start again. Commonly known as: NORVASC  Take 5 mg by mouth daily.   methotrexate  2.5 MG tablet Wait to take this until your doctor or other care provider tells you to start again. Commonly known as: RHEUMATREX Take 5 tablets (12.5 mg total) by mouth once a week. What changed:  how much to take when to take  this additional instructions       STOP taking these medications    escitalopram  5 MG tablet Commonly known as: LEXAPRO        TAKE these medications    acetaminophen  500 MG tablet Commonly known as: TYLENOL  Take 500 mg by mouth daily as needed for mild pain (pain score 1-3) or moderate pain (pain score 4-6).   albuterol  108 (90 Base) MCG/ACT inhaler Commonly known as: VENTOLIN  HFA Inhale 1-2 puffs into the lungs every 4 (four) hours as needed for wheezing or shortness of breath.   amiodarone  200 MG tablet Commonly known as: PACERONE  Take 1 tablet (200 mg total) by mouth 2 (two) times daily for 14 days, THEN 1 tablet (200 mg total) daily. Start taking on: December 28, 2023   apixaban  5 MG Tabs tablet Commonly known as: ELIQUIS  Take 1 tablet (5 mg total) by mouth 2 (two) times daily.   cyclobenzaprine  10 MG tablet Commonly known as: FLEXERIL  Take 1 tablet (10 mg total) by mouth 3 (three) times daily as needed for muscle spasms.   dicyclomine  10 MG capsule Commonly known as: BENTYL  Take 1 capsule (10 mg total) by mouth 3 (three) times daily as needed for spasms.   famotidine  20 MG  tablet Commonly known as: PEPCID  Take 1 tablet (20 mg total) by mouth daily as needed for heartburn or indigestion (reflux, stomach upset). What changed:  medication strength how much to take when to take this reasons to take this   fluticasone  50 MCG/ACT nasal spray Commonly known as: FLONASE  Place 2 sprays into both nostrils daily as needed for allergies.   folic acid  1 MG tablet Commonly known as: FOLVITE  Take 2 tablets (2 mg total) by mouth daily.   levothyroxine  100 MCG tablet Commonly known as: SYNTHROID  TAKE 1 TABLET BY MOUTH EVERY DAY   lidocaine  5 % ointment Commonly known as: XYLOCAINE  Apply 1 Application topically as needed.   metoprolol  tartrate 25 MG tablet Commonly known as: LOPRESSOR  Take 1 tablet (25 mg total) by mouth 2 (two) times daily.   nitroGLYCERIN  0.4 MG SL tablet Commonly known as: NITROSTAT  Place 1 tablet (0.4 mg total) under the tongue every 5 (five) minutes as needed for chest pain.   ondansetron  4 MG disintegrating tablet Commonly known as: ZOFRAN -ODT Take 1 tablet (4 mg total) by mouth 2 (two) times daily.   Stool Softener/Laxative 50-8.6 MG tablet Generic drug: senna-docusate Take 1 tablet by mouth at bedtime as needed for mild constipation.       ASK your doctor about these medications    cefUROXime  500 MG tablet Commonly known as: CEFTIN  Take 1 tablet (500 mg total) by mouth 2 (two) times daily for 5 doses. Ask about: Should I take this medication?   predniSONE  5 MG tablet Commonly known as: DELTASONE  Take 1 tablet (5 mg total) by mouth daily for 1 day. If pain increases after stopping, resume 5 mg daily and call Rheumatology. Ask about: Should I take this medication?        Follow-up Information     Burchette, Wolm ORN, MD Follow up.   Specialty: Family Medicine Why: Call MOnday to schedule a follow up for 7-10 after discharge Contact information: [address] [place] [address] [phone]                 Discharge Exam: Filed Weights   01/13/24 0852 01/14/24 0847 01/14/24 1002  Weight: 64.4 kg 66.7 kg 66.9 kg   General exam: awake,  alert, no acute distress HEENT: voice clear, hearing grossly normal  Respiratory system: CTAB, normal respiratory effort. On room air Gastrointestinal system: soft, ND,+bowel sounds. Central nervous system: A&O x4. no gross focal neurologic deficits, normal speech Psychiatry: normal mood, congruent affect, judgement and insight appear normal   Condition at discharge: stable  The results of significant diagnostics from this hospitalization (including imaging, microbiology, ancillary and laboratory) are listed below for reference.   Imaging Studies: CT Angio Chest PE W and/or Wo Contrast Result Date: 01/11/2024 EXAM: CTA CHEST 01/11/2024 06:01:45 PM TECHNIQUE: CTA of the chest was performed after the administration of 75 mL of iohexol  (OMNIPAQUE ) 350 MG/ML injection. Multiplanar reformatted images are provided for review. MIP images are provided for review. Automated exposure control, iterative reconstruction, and/or weight based adjustment of the mA/kV was utilized to reduce the radiation dose to as low as reasonably achievable. COMPARISON: CT chest 12/14/2000. CLINICAL HISTORY: Pulmonary embolism (PE) suspected, high prob. FINDINGS: PULMONARY ARTERIES: Pulmonary arteries are adequately opacified for evaluation. No acute pulmonary embolus. Main pulmonary artery is normal in caliber. MEDIASTINUM: Mild pericardial thickening. There is no acute abnormality of the thoracic aorta. LYMPH NODES: No mediastinal, hilar or axillary lymphadenopathy. LUNGS AND PLEURA: New bilateral small pleural effusions with associated passive atelectasis of the lower lobes. No pulmonary infarction. No pulmonary edema or infection identified. No pneumothorax. UPPER ABDOMEN: Limited images of the upper abdomen are unremarkable. SOFT TISSUES AND BONES: No acute bone or soft tissue  abnormality. IMPRESSION: 1. No evidence of pulmonary embolism. 2. New small bilateral pleural effusions with associated passive atelectasis in the lower lobes, without pulmonary infarction, edema, or infection. 3. Mild pericardial thickening. Electronically signed by: Norleen Boxer MD 01/11/2024 06:54 PM EST RP Workstation: [id number]   DG Chest 2 View Result Date: 01/11/2024 EXAM: 2 VIEW(S) XRAY OF THE CHEST 01/11/2024 12:23:48 PM COMPARISON: 12/17/2023 CLINICAL HISTORY: cp FINDINGS: LINES, TUBES AND DEVICES: EKG lead artifacts project over the upper lungs. LUNGS AND PLEURA: Improved left base airspace disease. Small left pleural effusion, minimally decreased. Trace right pleural fluid. No pneumothorax. HEART AND MEDIASTINUM: Mild cardiomegaly. Transverse aortic atherosclerosis. BONES AND SOFT TISSUES: No acute osseous abnormality. IMPRESSION: 1. Decreased small left pleural effusion with adjacent airspace disease, favoring atelectasis. 2. Cardiomegaly with trace right pleural fluid. 3. Aortic atherosclerosis (ICD10-I70.0). Electronically signed by: Rockey Kilts MD 01/11/2024 01:11 PM EST RP Workstation: [id number]    Microbiology: Results for orders placed or performed during the hospital encounter of 01/11/24  Resp panel by RT-PCR (RSV, Flu A&B, Covid) Anterior Nasal Swab     Status: None   Collection Time: 01/11/24  7:22 PM   Specimen: Anterior Nasal Swab  Result Value Ref Range Status   SARS Coronavirus 2 by RT PCR NEGATIVE NEGATIVE Final    Comment: (NOTE) SARS-CoV-2 target nucleic acids are NOT DETECTED.  The SARS-CoV-2 RNA is generally detectable in upper respiratory specimens during the acute phase of infection. The lowest concentration of SARS-CoV-2 viral copies this assay can detect is 138 copies/mL. A negative result does not preclude SARS-Cov-2 infection and should not be used as the sole basis for treatment or other patient management decisions. A negative result may occur with   improper specimen collection/handling, submission of specimen other than nasopharyngeal swab, presence of viral mutation(s) within the areas targeted by this assay, and inadequate number of viral copies(<138 copies/mL). A negative result must be combined with clinical observations, patient history, and epidemiological information. The expected result is Negative.  Fact Sheet for Patients:  bloggercourse.com  Fact Sheet for Healthcare Providers:  seriousbroker.it  This test is no t yet approved or cleared by the [place]  FDA and  has been authorized for detection and/or diagnosis of SARS-CoV-2 by FDA under an Emergency Use Authorization (EUA). This EUA will remain  in effect (meaning this test can be used) for the duration of the COVID-19 declaration under Section 564(b)(1) of the Act, 21 U.S.C.section 360bbb-3(b)(1), unless the authorization is terminated  or revoked sooner.       Influenza A by PCR NEGATIVE NEGATIVE Final   Influenza B by PCR NEGATIVE NEGATIVE Final    Comment: (NOTE) The Xpert Xpress SARS-CoV-2/FLU/RSV plus assay is intended as an aid in the diagnosis of influenza from Nasopharyngeal swab specimens and should not be used as a sole basis for treatment. Nasal washings and aspirates are unacceptable for Xpert Xpress SARS-CoV-2/FLU/RSV testing.  Fact Sheet for Patients: bloggercourse.com  Fact Sheet for Healthcare Providers: seriousbroker.it  This test is not yet approved or cleared by the [place]  FDA and has been authorized for detection and/or diagnosis of SARS-CoV-2 by FDA under an Emergency Use Authorization (EUA). This EUA will remain in effect (meaning this test can be used) for the duration of the COVID-19 declaration under Section 564(b)(1) of the Act, 21 U.S.C. section 360bbb-3(b)(1), unless the authorization is terminated or revoked.      Resp Syncytial Virus by PCR NEGATIVE NEGATIVE Final    Comment: (NOTE) Fact Sheet for Patients: bloggercourse.com  Fact Sheet for Healthcare Providers: seriousbroker.it  This test is not yet approved or cleared by the [place]  FDA and has been authorized for detection and/or diagnosis of SARS-CoV-2 by FDA under an Emergency Use Authorization (EUA). This EUA will remain in effect (meaning this test can be used) for the duration of the COVID-19 declaration under Section 564(b)(1) of the Act, 21 U.S.C. section 360bbb-3(b)(1), unless the authorization is terminated or revoked.  Performed at [facility], [address] [place], [place] [address]   Culture, blood (Routine X 2) w Reflex to ID Panel     Status: None   Collection Time: 01/12/24 12:34 AM   Specimen: BLOOD RIGHT ARM  Result Value Ref Range Status   Specimen Description   Final    BLOOD RIGHT ARM Performed at [facility], [address] [place], [place] [address]    Special Requests   Final    BOTTLES DRAWN AEROBIC ONLY Blood Culture results may not be optimal due to an inadequate volume of blood received in culture bottles Performed at [facility], [address], [place], [place] [address]    Culture   Final    NO GROWTH 5 DAYS Performed at [facility], [address], [place], [place] [address]    Report Status 01/17/2024 FINAL  Final  Culture, blood (Routine X 2) w Reflex to ID Panel     Status: None   Collection Time: 01/12/24 12:34 AM   Specimen: BLOOD RIGHT HAND  Result Value Ref Range Status   Specimen Description   Final    BLOOD RIGHT HAND Performed at [facility], [address] [place], [place] [address]    Special Requests   Final    BOTTLES DRAWN AEROBIC ONLY Blood Culture results may not be optimal due to an inadequate volume of blood received in culture bottles Performed at [facility], [address] [place], [place] [address]    Culture  Final    NO GROWTH 5 DAYS Performed at [facility], [address], [place], [place] [address]    Report Status 01/17/2024 FINAL  Final  Respiratory (~20 pathogens) panel by PCR     Status: None   Collection Time: 01/12/24  1:38 AM   Specimen: Nasopharyngeal Swab; Respiratory  Result Value Ref Range Status   Adenovirus NOT DETECTED NOT DETECTED Final   Coronavirus 229E NOT DETECTED NOT DETECTED Final    Comment: (NOTE) The Coronavirus on the Respiratory Panel, DOES NOT test for the novel  Coronavirus (2019 nCoV)    Coronavirus HKU1 NOT DETECTED NOT DETECTED Final   Coronavirus NL63 NOT DETECTED NOT DETECTED Final   Coronavirus OC43 NOT DETECTED NOT DETECTED Final   Metapneumovirus NOT DETECTED NOT DETECTED Final   Rhinovirus / Enterovirus NOT DETECTED NOT DETECTED Final   Influenza A NOT DETECTED NOT DETECTED Final   Influenza B NOT DETECTED NOT DETECTED Final   Parainfluenza Virus 1 NOT DETECTED NOT DETECTED Final   Parainfluenza Virus 2 NOT DETECTED NOT DETECTED Final   Parainfluenza Virus 3 NOT DETECTED NOT DETECTED Final   Parainfluenza Virus 4 NOT DETECTED NOT DETECTED Final   Respiratory Syncytial Virus NOT DETECTED NOT DETECTED Final   Bordetella pertussis NOT DETECTED NOT DETECTED Final   Bordetella Parapertussis NOT DETECTED NOT DETECTED Final   Chlamydophila pneumoniae NOT DETECTED NOT DETECTED Final   Mycoplasma pneumoniae NOT DETECTED NOT DETECTED Final    Comment: Performed at [facility], [address], [place], [place] [address]    Labs: CBC: No results for input(s): WBC, NEUTROABS, HGB, HCT, MCV, PLT in the last 168 hours.  Basic Metabolic Panel: No results for input(s): NA, K, CL, CO2, GLUCOSE, BUN, CREATININE, CALCIUM, MG, PHOS in the last 168 hours.  Liver Function Tests: No results for input(s): AST, ALT, ALKPHOS,  BILITOT, PROT, ALBUMIN in the last 168 hours. CBG: No results for input(s): GLUCAP in the last 168 hours.  Discharge time spent: greater than 30 minutes.  Signed: Burnard DELENA Cunning, DO [facility] 01/23/2024 "

## 2024-01-16 NOTE — Progress Notes (Signed)
 Arrived to patient waiting my arrival for discharge this morning   she is alert and oriented   she states that she can void easily since last night   her vitals are stable  she has no complaints   medications were given but later charted due to no connectivity on the computer   she has a face to face with Dr Fausto and agreed she was ready for discharge   AVS was gone over by RN   patient is advised that is she has any questions that she forgot to ask to feel free to call if needed

## 2024-01-17 LAB — CULTURE, BLOOD (ROUTINE X 2)
Culture: NO GROWTH
Culture: NO GROWTH

## 2024-01-19 ENCOUNTER — Telehealth: Payer: Self-pay | Admitting: *Deleted

## 2024-01-19 ENCOUNTER — Other Ambulatory Visit: Payer: Self-pay | Admitting: Family Medicine

## 2024-01-19 ENCOUNTER — Telehealth: Payer: Self-pay

## 2024-01-19 ENCOUNTER — Ambulatory Visit: Admitting: Family Medicine

## 2024-01-19 ENCOUNTER — Encounter: Payer: Self-pay | Admitting: Family Medicine

## 2024-01-19 VITALS — BP 130/70 | HR 80 | Temp 98.5°F | Wt 147.0 lb

## 2024-01-19 DIAGNOSIS — I10 Essential (primary) hypertension: Secondary | ICD-10-CM

## 2024-01-19 DIAGNOSIS — J90 Pleural effusion, not elsewhere classified: Secondary | ICD-10-CM

## 2024-01-19 DIAGNOSIS — M0600 Rheumatoid arthritis without rheumatoid factor, unspecified site: Secondary | ICD-10-CM

## 2024-01-19 DIAGNOSIS — E039 Hypothyroidism, unspecified: Secondary | ICD-10-CM

## 2024-01-19 DIAGNOSIS — J189 Pneumonia, unspecified organism: Secondary | ICD-10-CM

## 2024-01-19 DIAGNOSIS — A419 Sepsis, unspecified organism: Secondary | ICD-10-CM

## 2024-01-19 DIAGNOSIS — D472 Monoclonal gammopathy: Secondary | ICD-10-CM

## 2024-01-19 DIAGNOSIS — I480 Paroxysmal atrial fibrillation: Secondary | ICD-10-CM

## 2024-01-19 DIAGNOSIS — D72829 Elevated white blood cell count, unspecified: Secondary | ICD-10-CM

## 2024-01-19 NOTE — Telephone Encounter (Signed)
 Patient will need to obtain clearance by PCP to resume methotrexate .   Please also clarify what dose of methotrexate  she was taking before the hospitalizations? We will need to keep her on a reduced dose of methotrexate  going forward.

## 2024-01-19 NOTE — Patient Instructions (Signed)
 Glad you are feeling better  Follow up immediately for any fever or recurrent pleuritic pain.

## 2024-01-19 NOTE — Telephone Encounter (Unsigned)
 Copied from CRM [id number]. Topic: Clinical - Medical Advice >> Jan 19, 2024  1:49 PM Leila BROCKS wrote: Reason for CRM: Patient [phone] states went to pcp Dr. Micheal today and was advised to let Dr. Kara know what's going on with her health. Patient has been in and out of the hospital since December 2025 and January 2026, diagnosis from sepsis and pneumonia, last week was at [facility]. Patient states she's doing much better, medications has been changed- Methotrexate  has been taken off, please review her chart going back to 12/15/23 and if patient needs to be seen sooner? Informed patient was last seen 09/10/23 and was advised to follow up with a year. Please advise and call back.    ----------------------------------------------------------------------- From previous Reason for Contact - Scheduling Inquiry for Clinic: Reason for CRM:   Spoke with patient [facility], has been scheduled is aware of CT order.   - NFN

## 2024-01-19 NOTE — Telephone Encounter (Signed)
 Yes, we can schedule her an appointment in 2 months to repeat a CT Chest scan to follow up from her recent hospital scan to ensure that the pneumonia has cleared up.  CT Has been ordered  Thanks,  JD

## 2024-01-19 NOTE — Telephone Encounter (Signed)
 Patient contacted the office stating she was admitted to the hospital last month and the month. Patient states she was admitted last month for 5 nights for sepsis and they are unsure what it was coming from. Patient states she then was admitted for one over night stay at the hospital and the was under the care of the hospital at home for 5 days for Sepsis due to Pneumonia. Patient states at that time she was also advised she had pleurisy. Patient states she has been off her MTX during this time. Patient states she saw her PCP today who advised her that her lungs were clear after listening to her. Patient would like to know when she can restart her MTX and Folic Acid .  Please advise.

## 2024-01-19 NOTE — Progress Notes (Signed)
 "  Established Patient Office Visit  Subjective   Patient ID: Christy Moore, female    DOB: [birth year]  Age: 79 y.o. MRN: [id number]  Chief Complaint  Patient presents with   Hospitalization Follow-up    HPI    Christy Moore is seen for follow-up regarding recent hospital discharge.  She actually had admission back in mid December 9 through 12 for systemic inflammatory response syndrome.  Etiology unclear.  Her cultures were all negative at that time.  She was doing well and then developed prior to recent admission some mostly right thoracic chest pain and fever.  Past medical history significant for paroxysmal atrial fibrillation treated with amiodarone , Eliquis , metoprolol .  Also has history of Sjogren syndrome, rheumatoid arthritis, mild interstitial lung disease with bronchiectasis, hypertension, hypothyroidism, GERD with history of esophageal stricture, MGUS  She denied any cough at the time of her admission.  Had had some poor appetite and modest weight loss.  She had some shaking chills at the time of admission and 1 episode of nausea and vomiting.  No abdominal pain.  No dysuria.  Initial lab work revealed mild leukocytosis.  Testing for influenza, COVID, RSV all negative.  Blood cultures and urinalysis unremarkable.  Legionella and strep pneumo antigen negative.   CTA negative for PE.  She had new small bilateral pleural effusions with some atelectasis lower lobes.  Was given IV pain medicines for her pleuritic pain.  Sed rate was 83 with C-reactive protein 23.4.  She was diagnosed with presumptive pneumonia and initially treated with Rocephin  and then subsequently Omnicef  before being discharged on Ceftin .  She actually was treated through  hospital at home which is a new service [facility] has recently established.  Overall, she was very pleased with her care.  She states she feels better at this time than she has in quite some time.  No pleuritic pain.  No fever.  No chills.  Appetite  improved.  She has finished the Ceftin .  Also had Zithromycin with recent admission. Discharge medications including Synthroid , metoprolol , Eliquis , and amiodarone .  Methotrexate  held short-term until she follows up with her rheumatologist.  She did receive some recent prednisone  which likely elevated her white count significantly.  She is off prednisone  at this time.  Past Medical History:  Diagnosis Date   Arthritis    Cardiomegaly    Chronic pneumonia    Depression    GERD (gastroesophageal reflux disease)    Hypertension    Hypothyroidism    Paroxysmal atrial fibrillation (HCC) 01/12/2024   Raynaud disease    Seizures (HCC)    Sinus tachycardia    Sjogren's syndrome    Status post dilation of esophageal narrowing 2012   Dr. Luis   Past Surgical History:  Procedure Laterality Date   BASAL CELL CARCINOMA EXCISION  2024   CATARACT EXTRACTION, BILATERAL  2024   HAND TENDON SURGERY  02/09/2007   tendon transfer-Dr. Camella   NASAL SEPTUM SURGERY     operated on by Dr. Floy 20 years ago   OVARIAN CYST REMOVAL     Right Sided Salpingo-oopherectomy   SALPINGOOPHORECTOMY     WISDOM TOOTH EXTRACTION      reports that she has never smoked. She has been exposed to tobacco smoke. She has never used smokeless tobacco. She reports that she does not currently use alcohol. She reports that she does not use drugs. family history includes Alcohol abuse in her maternal grandfather and maternal grandmother; Allergies in an other family member; Cancer  in her father and mother; Heart attack (age of onset: 16) in her father; Hypothyroidism in her mother; Stroke (age of onset: 13) in her mother. Allergies[1]  Review of Systems  Constitutional:  Negative for chills and fever.  Respiratory:  Negative for cough and shortness of breath.   Cardiovascular:  Negative for chest pain.  Gastrointestinal:  Negative for abdominal pain, nausea and vomiting.  Genitourinary:  Negative for dysuria.       Objective:     BP 130/70   Pulse 80   Temp 98.5 F (36.9 C) (Oral)   Wt 147 lb (66.7 kg)   SpO2 95%   BMI 25.23 kg/m  BP Readings from Last 3 Encounters:  01/19/24 130/70  01/16/24 125/62  01/05/24 110/72   Wt Readings from Last 3 Encounters:  01/19/24 147 lb (66.7 kg)  01/14/24 147 lb 8 oz (66.9 kg)  01/05/24 142 lb 9.6 oz (64.7 kg)      Physical Exam Vitals reviewed.  Constitutional:      General: She is not in acute distress.    Appearance: She is not ill-appearing.  Cardiovascular:     Rate and Rhythm: Normal rate and regular rhythm.  Pulmonary:     Effort: Pulmonary effort is normal.     Breath sounds: Normal breath sounds. No wheezing or rales.  Musculoskeletal:     Right lower leg: No edema.     Left lower leg: No edema.  Neurological:     Mental Status: She is alert.      No results found for any visits on 01/19/24.  Last CBC Lab Results  Component Value Date   WBC 13.8 (H) 01/16/2024   HGB 11.5 (L) 01/16/2024   HCT 35.2 (L) 01/16/2024   MCV 94.4 01/16/2024   MCH 30.8 01/16/2024   RDW 14.2 01/16/2024   PLT 359 01/16/2024   Last metabolic panel Lab Results  Component Value Date   GLUCOSE 106 (H) 01/14/2024   NA 140 01/14/2024   K 4.6 01/14/2024   CL 104 01/14/2024   CO2 24 01/14/2024   BUN 19 01/14/2024   CREATININE 0.79 01/14/2024   GFRNONAA >60 01/14/2024   CALCIUM 9.0 01/14/2024   PHOS 3.6 12/17/2023   PROT 7.1 11/25/2023   ALBUMIN 3.9 11/25/2023   LABGLOB 3.2 11/25/2023   BILITOT 0.6 11/25/2023   ALKPHOS 75 11/25/2023   AST 20 11/25/2023   ALT 6 11/25/2023   ANIONGAP 12 01/14/2024   Last thyroid  functions Lab Results  Component Value Date   TSH 1.160 01/12/2024      The ASCVD Risk score (Arnett DK, et al., 2019) failed to calculate for the following reasons:   Cannot find a previous HDL lab   Cannot find a previous total cholesterol lab   * - Cholesterol units were assumed    Assessment & Plan:   #1 recent  presumed sepsis secondary to pneumonia.  Blood culture remain negative.  Legionella and strep antigens negative.  Patient treated with Rocephin , azithromycin  and subsequently Ceftin .  No cough or fever at this time.  #2 recent leukocytosis probably related to #1 and recent prednisone .  We discussed getting follow-up CBC and CMP today.  She has very difficult time access for blood draws and is requesting that we defer labs for another week.  Follow-up immediately for any fever or other concerns  #3 recent thoracic pain.  Presumed pleuritic.  CTA no pulmonary embolus.  Pain resolved at this time  #4 hypothyroidism treated  with Synthroid .  Recent TSH normal.  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  #6 history of A-fib.  Appears to be sinus rhythm at this time.  Continue metoprolol  and Eliquis .  #7 history of Sjogren's disease  #8 history of seronegative rheumatoid arthritis  #9 history of hypertension currently stable and well-controlled on just metoprolol .  Continue to hold amlodipine  for now as long as blood pressure stay consistently less than 140 systolic   No follow-ups on file.    Wolm Scarlet, MD     [1]  Allergies Allergen Reactions   Protonix  [email or web address] ] Other (See Comments)    Terrible stomach cramps   Albuterol -Budesonide Other (See Comments)    Burned her tongue    Amoxicillin -Pot Clavulanate Nausea Only   Azithromycin  Nausea Only   Broccoli [Brassica Oleracea] Other (See Comments)    RAW only    Celecoxib Nausea Only   Ciprofloxacin Nausea Only   Doxycycline  Other (See Comments)     gi upset   Erythromycin Nausea Only   Hydrocodone Nausea Only   Hydrocodone-Acetaminophen  Other (See Comments)    GI upset   Levofloxacin Other (See Comments)    she claims renal failiure     Omeprazole  Nausea And Vomiting   Penicillins Other (See Comments)    Gi upset    Propoxyphene N-Acetaminophen  Other (See Comments)    Unknown per pt   Rofecoxib  Nausea Only   Sulfa Antibiotics Other (See Comments)    Gi upset    Tetanus Toxoid Rash   "

## 2024-01-20 ENCOUNTER — Telehealth: Payer: Self-pay

## 2024-01-20 NOTE — Telephone Encounter (Signed)
 Patient informed of the message below and voiced understanding

## 2024-01-20 NOTE — Telephone Encounter (Signed)
 Copied from CRM [id number]. Topic: General - Other >> Jan 20, 2024 12:58 PM Deaijah H wrote: Reason for CRM: Dr. Micheal needs to provide a written statement stating that she is clear of the infection that had to Dr. Maya Nash Phone : [phone]. If needed, please call.

## 2024-01-20 NOTE — Telephone Encounter (Signed)
 Patient advised she will need to obtain clearance from PCP to resume MTX. Advised we will need written clearance that her infection has cleared. Patient has expressed understanding.   Patient states she has been taking MTX 6 tabs once weekly.

## 2024-01-22 ENCOUNTER — Ambulatory Visit
Admission: RE | Admit: 2024-01-22 | Discharge: 2024-01-22 | Disposition: A | Discharge status: Home / Self Care | Payer: PRIVATE HEALTH INSURANCE | Source: Ambulatory Visit | Attending: Pulmonary Disease | Admitting: Pulmonary Disease

## 2024-01-22 ENCOUNTER — Encounter (HOSPITAL_BASED_OUTPATIENT_CLINIC_OR_DEPARTMENT_OTHER): Payer: Self-pay

## 2024-01-22 ENCOUNTER — Other Ambulatory Visit (HOSPITAL_COMMUNITY): Payer: Self-pay

## 2024-01-22 DIAGNOSIS — J189 Pneumonia, unspecified organism: Secondary | ICD-10-CM

## 2024-01-22 DIAGNOSIS — I480 Paroxysmal atrial fibrillation: Secondary | ICD-10-CM

## 2024-01-23 ENCOUNTER — Encounter (HOSPITAL_COMMUNITY): Payer: Self-pay | Admitting: Internal Medicine

## 2024-01-26 ENCOUNTER — Encounter: Admitting: Rheumatology

## 2024-01-26 ENCOUNTER — Other Ambulatory Visit (INDEPENDENT_AMBULATORY_CARE_PROVIDER_SITE_OTHER): Payer: PRIVATE HEALTH INSURANCE

## 2024-01-26 DIAGNOSIS — D72829 Elevated white blood cell count, unspecified: Secondary | ICD-10-CM | POA: Diagnosis not present

## 2024-01-26 LAB — COMPREHENSIVE METABOLIC PANEL WITH GFR
ALT: 10 U/L (ref 3–35)
AST: 20 U/L (ref 5–37)
Albumin: 3.6 g/dL (ref 3.5–5.2)
Alkaline Phosphatase: 80 U/L (ref 39–117)
BUN: 12 mg/dL (ref 6–23)
CO2: 26 meq/L (ref 19–32)
Calcium: 9.1 mg/dL (ref 8.4–10.5)
Chloride: 100 meq/L (ref 96–112)
Creatinine, Ser: 0.97 mg/dL (ref 0.40–1.20)
GFR: 56.59 mL/min — ABNORMAL LOW
Glucose, Bld: 113 mg/dL — ABNORMAL HIGH (ref 70–99)
Potassium: 4.3 meq/L (ref 3.5–5.1)
Sodium: 134 meq/L — ABNORMAL LOW (ref 135–145)
Total Bilirubin: 0.4 mg/dL (ref 0.2–1.2)
Total Protein: 7.5 g/dL (ref 6.0–8.3)

## 2024-01-26 LAB — CBC WITH DIFFERENTIAL/PLATELET
Basophils Absolute: 0 K/uL (ref 0.0–0.1)
Basophils Relative: 0.2 % (ref 0.0–3.0)
Eosinophils Absolute: 0.2 K/uL (ref 0.0–0.7)
Eosinophils Relative: 1 % (ref 0.0–5.0)
HCT: 38.2 % (ref 36.0–46.0)
Hemoglobin: 12.6 g/dL (ref 12.0–15.0)
Lymphocytes Relative: 4.9 % — ABNORMAL LOW (ref 12.0–46.0)
Lymphs Abs: 0.8 K/uL (ref 0.7–4.0)
MCHC: 33 g/dL (ref 30.0–36.0)
MCV: 91.7 fl (ref 78.0–100.0)
Monocytes Absolute: 0.7 K/uL (ref 0.1–1.0)
Monocytes Relative: 4.1 % (ref 3.0–12.0)
Neutro Abs: 14.9 K/uL — ABNORMAL HIGH (ref 1.4–7.7)
Neutrophils Relative %: 89.8 % — ABNORMAL HIGH (ref 43.0–77.0)
Platelets: 319 K/uL (ref 150.0–400.0)
RBC: 4.16 Mil/uL (ref 3.87–5.11)
RDW: 15.7 % — ABNORMAL HIGH (ref 11.5–15.5)
WBC: 16.6 K/uL — ABNORMAL HIGH (ref 4.0–10.5)

## 2024-01-26 NOTE — Telephone Encounter (Signed)
 Any contraindication to switch to Warfarin from Eliquis  from your perspective? If not, I'll help coordinate.   Thanks,  Reche GORMAN Finder, NP

## 2024-01-27 ENCOUNTER — Telehealth: Payer: Self-pay | Admitting: *Deleted

## 2024-01-27 ENCOUNTER — Ambulatory Visit: Payer: Self-pay | Admitting: Family Medicine

## 2024-01-27 ENCOUNTER — Encounter: Payer: Self-pay | Admitting: Gastroenterology

## 2024-01-27 ENCOUNTER — Ambulatory Visit (INDEPENDENT_AMBULATORY_CARE_PROVIDER_SITE_OTHER): Admitting: Gastroenterology

## 2024-01-27 VITALS — BP 118/76 | Ht 64.0 in | Wt 140.0 lb

## 2024-01-27 DIAGNOSIS — R630 Anorexia: Secondary | ICD-10-CM | POA: Diagnosis not present

## 2024-01-27 DIAGNOSIS — Z8719 Personal history of other diseases of the digestive system: Secondary | ICD-10-CM | POA: Diagnosis not present

## 2024-01-27 DIAGNOSIS — R0789 Other chest pain: Secondary | ICD-10-CM

## 2024-01-27 DIAGNOSIS — R634 Abnormal weight loss: Secondary | ICD-10-CM

## 2024-01-27 DIAGNOSIS — K219 Gastro-esophageal reflux disease without esophagitis: Secondary | ICD-10-CM

## 2024-01-27 DIAGNOSIS — K5900 Constipation, unspecified: Secondary | ICD-10-CM

## 2024-01-27 DIAGNOSIS — I480 Paroxysmal atrial fibrillation: Secondary | ICD-10-CM | POA: Insufficient documentation

## 2024-01-27 DIAGNOSIS — D72829 Elevated white blood cell count, unspecified: Secondary | ICD-10-CM

## 2024-01-27 NOTE — Telephone Encounter (Signed)
 Called patient after receiving a new anticoagulation referral. She states that the [facility] was able to get her a 30 day supply of eliquis  so she will have enough for a month.   Advised that once she gets down to 2 weeks supply left of the eliquis  to call the Anticoagulation Clinic and we will give her special instructions and a new patient appointment. She is aware in the beginning she will have frequent INR checks, which is weekly until stable then the checks will be extended weekly. She is somewhat familiar with this as her husband is on warfarin. She states she will call back around 02/10/24 and discuss appointment date and how to transition to warfarin. She is aware will send in the warfarin at that call.    Dx-Afib Currently on amiodarone 

## 2024-01-27 NOTE — Patient Instructions (Signed)
 You have been scheduled for a Barium Esophogram at [facility] (1st floor of the hospital) on Thursday February 25, 2024 at 9:00 am. Please arrive 30 minutes prior to your appointment for registration. Make certain not to have anything to eat or drink 3 hours prior to your test. If you need to reschedule for any reason, please contact radiology at [phone] to do so. __________________________________________________________________ A barium swallow is an examination that concentrates on views of the esophagus. This tends to be a double contrast exam (barium and two liquids which, when combined, create a gas to distend the wall of the oesophagus) or single contrast (non-ionic iodine based). The study is usually tailored to your symptoms so a good history is essential. Attention is paid during the study to the form, structure and configuration of the esophagus, looking for functional disorders (such as aspiration, dysphagia, achalasia, motility and reflux) EXAMINATION You may be asked to change into a gown, depending on the type of swallow being performed. A radiologist and radiographer will perform the procedure. The radiologist will advise you of the type of contrast selected for your procedure and direct you during the exam. You will be asked to stand, sit or lie in several different positions and to hold a small amount of fluid in your mouth before being asked to swallow while the imaging is performed .In some instances you may be asked to swallow barium coated marshmallows to assess the motility of a solid food bolus. The exam can be recorded as a digital or video fluoroscopy procedure. POST PROCEDURE It will take 1-2 days for the barium to pass through your system. To facilitate this, it is important, unless otherwise directed, to increase your fluids for the next 24-48hrs and to resume your normal diet.  This test typically takes about 30 minutes to  perform. __________________________________________________________________________________  INCREASE WATER INTAKE  INCREASE DIETARY FIBER  CONSIDER ADDING A FIBER SUPPLEMENT SUCH AS  METAMUCIL OR BENEFIBER  TAKE MIRALAX 1 CAPFUL DAILY. ADJUST DOSE ACCORDING TO RESPONSE  _______________________________________________________  If your blood pressure at your visit was 140/90 or greater, please contact your primary care physician to follow up on this.  _______________________________________________________  If you are age 46 or older, your body mass index should be between 23-30. Your Body mass index is 24.03 kg/m. If this is out of the aforementioned range listed, please consider follow up with your Primary Care Provider.  If you are age 86 or younger, your body mass index should be between 19-25. Your Body mass index is 24.03 kg/m. If this is out of the aformentioned range listed, please consider follow up with your Primary Care Provider.   ________________________________________________________  The [facility] providers would like to encourage you to use MYCHART to communicate with providers for non-urgent requests or questions.  Due to long hold times on the telephone, sending your provider a message by [facility] may be a faster and more efficient way to get a response.  Please allow 48 business hours for a response.  Please remember that this is for non-urgent requests.  _______________________________________________________  [facility] is using a team-based approach to care.  Your team is made up of your doctor and two to three APPS. Our APPS (Nurse Practitioners and Physician Assistants) work with your physician to ensure care continuity for you. They are fully qualified to address your health concerns and develop a treatment plan. They communicate directly with your gastroenterologist to care for you. Seeing the Advanced Practice Practitioners on  your physician's team  can help you by facilitating care more promptly, often allowing for earlier appointments, access to diagnostic testing, procedures, and other specialty referrals.

## 2024-01-27 NOTE — Progress Notes (Signed)
 "  Christy Moore [id number] 1945-03-17   Chief Complaint: Hospital follow up  Referring Provider: Micheal Wolm LELON, MD Primary GI MD: Dr. Shila  HPI: Oluwaseyi Tull Christy Moore is a 79 y.o. female with past medical history of GERD, esophageal narrowing s/p dilation 2012 with Dr. Luis, HTN, hypothyroidism, seizures, Sjogren syndrome, ILD, bronchiectasis, MGUS, depression, cardiomegaly who presents today for hospital follow up.    Seen in office 11/06/2020 by Dr. Teressa for chronic GERD, Sjogren's syndrome with dry mouth.  Was advised that it was safe for her to remain on famotidine  40 mg as well as to take a morning famotidine  20 or 40 mg.   She has history of dysphagia and a benign GE junction stricture.  EGD 2004, Dr. Avram treated with [facility] dilator.  Transferred care to Dr. Luis.  EGD around 2012, with repeat dilation and good relief of her symptoms.  Repeat EGD 08/2020 after establishing care with Dr. Teressa found mild nonspecific gastritis.  Biopsies from stomach and esophagus were all normal.  She had a colonoscopy 08/2020 and found no polyps, colon was randomly biopsied due to recent diarrheal illness and biopsies were all normal.   05/20/2023 patient seen by PCP with chronic cough with concern for Sjogren's syndrome and lung involvement.  Also with recurrent dysphagia recently in the setting of chronic GERD for which she is referred to us .  I saw patient 06/18/2023 at which time she was doing well and had not had any acid reflux, heartburn, regurgitation, or dysphagia in the past 3 weeks.  Previously was having the symptoms but they improved after she was treated with 2-week course of doxycycline  for pneumonia.  She was planning to have further testing with pulmonology including a chest CT and the next couple weeks and opted to hold off on any further evaluation of her dysphagia and GERD at that time.  Plan was to have her restart famotidine  if she had a recurrence of her GERD symptoms and  if recurrence of dysphagia plan for EGD with dilation, or alternatively start with barium swallow.  Patient was admitted to the hospital 12/15/2023 to 12/18/2023 after presenting with central chest pain radiating to her back which occurred while she was watching TV.  Also endorsed shortness of breath, cough, and subjective fever.  On presentation was in mild sinus tachycardia, found to have fever of 101.6, tachypneic, tachycardic, hypoxic, placed on 2 L of oxygen.  Lab work showed potassium 5.5, lactate of 2.1.  COVID/flu/RSV negative.  UA not suspicious for UTI.  Blood cultures collected.  CT chest/abdomen/pelvis showed cardiomegaly, trace left pleural effusion, chronic bibasilar scarring/bronchiectasis, no evidence of PE.  Patient started on antibiotics.  GI consulted for suspicion of severe GERD/esophagitis , plan for EGD.  She  also went into new onset A-fib/cardiology consulted.  Diagnosed with SIRS, started on antibiotics.  Chest x-ray follow-up did not show any new pneumonia.  Was on bronchodilators for acute hypoxic respiratory failure. Plan per cardiology regarding her new onset A-fib was to start Eliquis  after her EGD.  Echo showed LVEF of 50 to 55%.  Initial plan regarding atypical chest pain in setting of GERD with history of esophageal stricture was to perform EGD with dilation the day after initial consult, however overnight patient had interval cardiopulmonary events, GI issues were nonurgent and advised to hold off on endoscopy and consider doing this in outpatient setting.  Patient began Eliquis  5 mg twice daily on 12/16.  Plan per A-fib clinic note 12/28/2023 was to  continue that dose.  She was also started on amiodarone .  01/11/2024 to 01/16/2024 patient hospitalized for sepsis due to pneumonia.  Presented to the ED with chest pain which she stated felt like muscle soreness.  Endorsed decreased appetite, early satiety without dysphagia, unintentional 5 pound weight loss.  CTA chest was  negative for PE.  Did have new small bilateral pleural effusions with associated passive atelectasis in the lower lobes, without pulmonary infarction, edema, or infection.  Mild pericardial thickening noted.  Discharged on p.o. Ceftin .   Discussed the use of AI scribe software for clinical note transcription with the patient, who gave verbal consent to proceed.  History of Present Illness Christy Moore is a 79 year old female with atrial fibrillation on Eliquis  and prior esophageal stricture who presents for evaluation of esophageal symptoms and gastrointestinal follow-up after recent hospitalizations.  Esophageal Symptoms: - Prior esophageal stricture with previous recommendations for upper endoscopy and possible dilation, not performed due to cardiac events  - No current dysphagia or trouble swallowing - No acid reflux symptoms since initial hospitalization - Not taking acid-suppressing medications - Previous use of pantoprazole  and omeprazole  caused upper abdominal pain, described as stomach feeling like a punching bag, which resolved after discontinuation  Appetite and Weight Changes: - Decreased appetite over the past two months - Unintentional weight loss of nearly 10 pounds - Attributes some weight loss to improved adherence to Synthroid , now taken alone at 6 a.m. daily - Thyroid  function checked earlier this month and was normal - No early satiety  Bowel Habits: - Intermittent constipation, particularly after hospitalizations and antibiotic use - Stools described as raisins and marbles - Difficulty maintaining adequate hydration - Senokot and Miralax available at home; hesitant to use Senokot due to prior cramping - Uses Miralax and fiber supplements as needed - Last week, had two bowel movements daily, described as almost normal - No current use of dicyclomine   - Colonoscopy in 2022 was normal  Recent Hospitalizations and Cardiac Events: - Hospitalized in December  for infection, during which she experienced chest pain and developed atrial fibrillation, attributed to medications - Significant cardiac event with heart pause of 17 to 20 seconds, requiring rapid response intervention - Discharged the following day and initially felt well - Subsequently developed pleurisy and was diagnosed with a right-sided pneumonia - Admitted for one day, then transitioned to hospital-at-home program with IV antibiotics, oxygen, and close monitoring - Medications transitioned from IV to oral; completed treatment without complications - No supplemental oxygen required at home since then  Musculoskeletal Pain: - 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 Biopsied to check for H. pylori.  - The examination was otherwise normal.  - Biopsies were taken with a cold forceps from the esopahgus to check for Eosinophilic Esophagitis. Path: 2. Surgical [P], gastric - MILD CHRONIC GASTRITIS WITHOUT ACTIVITY - NO INTESTINAL METAPLASIA IDENTIFIED - SEE COMMENT 3. Surgical [P], distal esophagus - REACTIVE SQUAMOUS MUCOSA WITH INCREASED INTRAEPITHELIAL LYMPHOCYTES AND RARE EOSINOPHILS - SEE COMMENT 4. Surgical [P], proximal esophagus - REACTIVE SQUAMOUS MUCOSA WITH INCREASED INTRAEPITHELIAL LYMPHOCYTES AND RARE EOSINOPHILS - SEE COMMENT *Negative for H. pylori   Colonoscopy 08/13/2020 - The  examined portion of the ileum was normal.  - Diverticulosis in the left colon.  - The examination was otherwise normal on direct and retroflexion views.  - Biopsies were taken with a cold forceps from the entire colon for evaluation  of microscopic colitis. Path: 1. Surgical [P], colon nos, random sites - BENIGN COLONIC MUCOSA - NO ACTIVE INFLAMMATION OR EVIDENCE OF MICROSCOPIC COLITIS - NO HIGH GRADE DYSPLASIA OR MALIGNANCY IDENTIFIED   EGD 10/21/2002 - Esophageal stricture in distal esophagus, dilated   Past Medical History:  Diagnosis Date   Acute respiratory failure with hypoxia (HCC) 12/15/2023   Arthritis    Cardiomegaly    Chronic pneumonia    Depression    GERD (gastroesophageal reflux disease)    Hypertension    Hypothyroidism    Paroxysmal atrial fibrillation (HCC) 01/12/2024   Raynaud disease    Seizures (HCC)    Sinus tachycardia    Sjogren's syndrome    Status post dilation of esophageal narrowing 2012   Dr. Luis    Past Surgical History:  Procedure Laterality Date   BASAL CELL CARCINOMA EXCISION  2024   CATARACT EXTRACTION, BILATERAL  2024   HAND TENDON SURGERY  02/09/2007   tendon transfer-Dr. Camella   NASAL SEPTUM SURGERY     operated on by Dr. Floy 20 years ago   OVARIAN CYST REMOVAL     Right Sided Salpingo-oopherectomy   SALPINGOOPHORECTOMY     WISDOM TOOTH EXTRACTION      Current Outpatient Medications  Medication Sig Dispense Refill   acetaminophen  (TYLENOL ) 500 MG tablet Take 500 mg by mouth daily as needed for mild pain (pain score 1-3) or moderate pain (pain score 4-6).     albuterol  (VENTOLIN  HFA) 108 (90 Base) MCG/ACT inhaler Inhale 1-2 puffs into the lungs every 4 (four) hours as needed for wheezing or shortness of breath.     amiodarone  (PACERONE ) 200 MG tablet Take 1 tablet (200 mg total) by mouth 2 (two) times daily for 14 days, THEN 1 tablet (200 mg total) daily. 60 tablet 2   [Paused] amLODipine  (NORVASC ) 5 MG tablet Take 5 mg by mouth daily.     apixaban  (ELIQUIS ) 5 MG TABS tablet Take 1 tablet (5 mg total) by mouth 2 (two) times daily. 60 tablet 5   cyclobenzaprine  (FLEXERIL ) 10 MG tablet Take 1 tablet (10 mg total) by mouth 3 (three) times daily as needed  for muscle spasms. 60 tablet 0   dicyclomine  (BENTYL ) 10 MG capsule Take 1 capsule (10 mg total) by mouth 3 (three) times daily as needed for spasms. 30 capsule 0   famotidine  (PEPCID ) 20 MG tablet Take 1 tablet (20 mg total) by mouth daily as needed for heartburn or indigestion (reflux, stomach upset). 20 tablet 0   fluticasone  (FLONASE ) 50 MCG/ACT nasal spray Place 2 sprays into both nostrils daily as needed for allergies.     folic acid  (FOLVITE ) 1 MG tablet Take 2 tablets (2 mg total) by mouth daily. 180 tablet 3   levothyroxine  (SYNTHROID ) 100 MCG tablet TAKE 1 TABLET BY MOUTH EVERY DAY 90 tablet 1   lidocaine  (XYLOCAINE ) 5 % ointment Apply 1 Application topically as needed. 35.44 g 0   [Paused] methotrexate  (RHEUMATREX) 2.5 MG tablet Take 5 tablets (12.5 mg total) by mouth once a week. (Patient taking differently: Take 7.5 mg by mouth See admin instructions. Take 7.5 mg twice daily once a week on Sunday.) 60 tablet 0   metoprolol  tartrate (LOPRESSOR ) 25 MG tablet  Take 1 tablet (25 mg total) by mouth 2 (two) times daily. 60 tablet 1   nitroGLYCERIN  (NITROSTAT ) 0.4 MG SL tablet Place 1 tablet (0.4 mg total) under the tongue every 5 (five) minutes as needed for chest pain. 20 tablet 0   ondansetron  (ZOFRAN -ODT) 4 MG disintegrating tablet Take 1 tablet (4 mg total) by mouth 2 (two) times daily. 20 tablet 0   senna-docusate (SENOKOT-S) 8.6-50 MG tablet Take 1 tablet by mouth at bedtime as needed for mild constipation. 20 tablet 0   No current facility-administered medications for this visit.    Allergies as of 01/27/2024 - Review Complete 01/27/2024  Allergen Reaction Noted   Protonix  [pantoprazole ] Other (See Comments) 01/05/2024   Albuterol -budesonide Other (See Comments) 06/18/2023   Amoxicillin -pot clavulanate Nausea Only 12/20/2007   Azithromycin  Nausea Only 12/20/2007   Broccoli jacobo oleracea] Other (See Comments) 08/25/2023   Celecoxib Nausea Only 12/20/2007   Ciprofloxacin  Nausea Only 12/20/2007   Doxycycline  Other (See Comments) 12/20/2007   Erythromycin Nausea Only 12/20/2007   Hydrocodone Nausea Only 12/20/2007   Hydrocodone-acetaminophen  Other (See Comments) 12/20/2007   Levofloxacin Other (See Comments) 12/20/2007   Omeprazole  Nausea And Vomiting 12/14/2020   Penicillins Other (See Comments) 10/03/2018   Propoxyphene n-acetaminophen  Other (See Comments) 12/20/2007   Rofecoxib Nausea Only 12/20/2007   Sulfa antibiotics Other (See Comments) 10/03/2018   Tetanus toxoid Rash 12/20/2007    Family History  Problem Relation Age of Onset   Cancer Mother        breast and lung   Stroke Mother 44   Hypothyroidism Mother    Cancer Father        prostate   Heart attack Father 51   Alcohol abuse Maternal Grandmother    Alcohol abuse Maternal Grandfather    Allergies Other        Grandmother-pt states she takes after grandmother who had multiple allergies   Colon cancer Neg Hx    Esophageal cancer Neg Hx    Stomach cancer Neg Hx    Rectal cancer Neg Hx     Social History[1]   Review of Systems:    Constitutional: Unintentional weight loss of 10 pounds (possibly explained by recent hospitalizations), no fever or chills Cardiovascular: No chest pain Respiratory: No SOB  Gastrointestinal: See HPI and otherwise negative   Physical Exam:  Vital signs: BP 118/76   Ht 5' 4 (1.626 m)   Wt 140 lb (63.5 kg)   BMI 24.03 kg/m   Wt Readings from Last 3 Encounters:  01/27/24 140 lb (63.5 kg)  01/19/24 147 lb (66.7 kg)  01/14/24 147 lb 8 oz (66.9 kg)     Constitutional: Pleasant, well-appearing female in NAD, alert and cooperative Head:  Normocephalic and atraumatic.  Respiratory: Respirations even and unlabored. Lungs clear to auscultation bilaterally.  No wheezes, crackles, or rhonchi.  Cardiovascular:  Regular rate and rhythm. No murmurs. No peripheral edema. Gastrointestinal:  Soft, nondistended, nontender. No rebound or guarding. Normal bowel  sounds. No appreciable masses or hepatomegaly. Rectal:  Not performed.  Neurologic:  Alert and oriented x4;  grossly normal neurologically.  Skin:   Dry and intact without significant lesions or rashes. Psychiatric: Oriented to person, place and time. Demonstrates good judgement and reason without abnormal affect or behaviors.   Echocardiogram 05/22/2020 1. Left ventricular ejection fraction, by estimation, is 50 to 55% . The left ventricle has low normal function. The left ventricle has no regional wall motion abnormalities. Left ventricular diastolic parameters are consistent  with Grade I diastolic dysfunction ( impaired relaxation) . Elevated left atrial pressure. The average left ventricular global longitudinal strain is - 22. 3 % . The global longitudinal strain is normal.  2. Right ventricular systolic function is normal. The right ventricular size is normal. Tricuspid regurgitation signal is inadequate for assessing PA pressure.  3. The mitral valve is normal in structure. Mild mitral valve regurgitation. No evidence of mitral stenosis.  4. The aortic valve is tricuspid. Aortic valve regurgitation is not visualized. Mild aortic valve sclerosis is present, with no evidence of aortic valve stenosis.  5. The inferior vena cava is normal in size with greater than 50% respiratory variability, suggesting right atrial pressure of 3 mmHg.  Assessment/Plan:   Assessment & Plan GERD History of esophageal stricture Atypical chest pain Decreased appetite Patient seen today for hospital follow-up.  It had been discussed that she would have EGD with dilation in outpatient setting following her hospitalization in December.  She had been having some atypical chest pain and has history of esophageal stricture s/p dilation.  At this time patient denies any dysphagia.  Denies any acid reflux or heartburn symptoms and is not on a PPI.  States that PPIs have previously made her feel worse and cause stomach  pain.  She is not particularly interested in an upper endoscopy.  States her chest pain has improved and seems to be musculoskeletal, relieved with heat and Tylenol . She does have decreased appetite which is present for the last couple months as well as some unintentional weight loss, she attributes some of this to not taking her Synthroid  properly.  May also be explained by her recent hospitalizations.  She is nontender on abdominal exam today.  Has a new diagnosis of A-fib and is on Eliquis .  Likely would need to wait at least a couple more months until we would be able to get approval to hold her Eliquis  prior to an endoscopy. Did recommend patient to have barium swallow study to evaluate for any mass or stricture which she is agreeable to.  Will have her follow-up in office in a couple months with Dr. Nandigam to discuss potential endoscopic evaluation.  - Ordered barium swallow to evaluate for esophageal mass, stricture, or dysmotility.  - Follow up 2-3 months with Dr. Shila to revisit EGD  Constipation Intermittent mild constipation post-hospitalization and antibiotic use. Prefers to avoid stimulant laxatives.  - Recommended trial of polyethylene glycol one capful daily, with dose adjustment based on response. - Advised increasing dietary fiber and considering a fiber supplement such as psyllium or wheat dextrin. - Instructed to monitor for hematochezia or persistent symptoms and notify the office if these occur.   Camie Furbish, PA-C  Gastroenterology 01/27/2024, 11:01 AM  Patient Care Team: Micheal Wolm ORN, MD as PCP - General (Family Medicine) Raford Riggs, MD as PCP - Cardiology (Cardiology) Liane Sharyne MATSU, [facility] (Inactive) as Pharmacist (Pharmacist)       [1]  Social History Tobacco Use   Smoking status: Never    Passive exposure: Past   Smokeless tobacco: Never   Tobacco comments:    Never smoked 12/28/23  Vaping Use   Vaping status: Never Used   Substance Use Topics   Alcohol use: Not Currently    Comment: rarely   Drug use: No   "

## 2024-01-28 NOTE — Telephone Encounter (Signed)
 Will need to pick up 1 week supply of Eliquis  today.  She will need new patient slot for Coumadin Clinic. There are available slots (please confirm prior to calling): 02/03/24 at 10:30 am 02/04/24 at 2:30 pm 02/05/24 at 10:30 am  She will start Warfarin 5mg  daily. Send 30 day supply with 0 refills to preferred pharmacy. On day 3 of Warfarin, stop Eliquis . On day 5 or 6 of Warfarin, will need to establish with [facility] in 'new patient' slot.   As such, if her Coumadin Clinic visit is 1/28 (start Warfarin 1/24 - stop Eliquis  1/26). If first Coumadin Clinic visit is 1/29 (start Warfarin 1/25 - stop Eliquis  1/27). If first Coumadin Clinic visit is 1/30 (start Warfarin 1/26 - stop Eliquis  1/28).    Christy Moore S Denaisha Swango, NP

## 2024-01-28 NOTE — Addendum Note (Signed)
 Addended by: METTA KRISTEN CROME on: 01/28/2024 08:18 AM   Modules accepted: Orders

## 2024-01-29 ENCOUNTER — Telehealth: Payer: Self-pay

## 2024-01-29 ENCOUNTER — Telehealth: Payer: Self-pay | Admitting: *Deleted

## 2024-01-29 NOTE — Telephone Encounter (Signed)
 Please see result note

## 2024-01-29 NOTE — Telephone Encounter (Signed)
 Copied from CRM #[id number]. Topic: General - Other >> Jan 29, 2024  8:56 AM Mercedes MATSU wrote: Reason for CRM: Patient called in stating that she had a missed call from CMA [facility]kell. Patient is requesting a call back and can be reached at [phone].

## 2024-01-29 NOTE — Telephone Encounter (Signed)
 Copied from CRM #[id number]. Topic: General - Other >> Jan 29, 2024  8:56 AM Mercedes MATSU wrote: Reason for CRM: Patient called in stating that she had a missed call from CMA Mykell. Patient is requesting a call back and can be reached at [phone]. >> Jan 29, 2024 11:32 AM Macario HERO wrote: Patient called regarding missed all from Mykal. Advised message was sent and he will return call.

## 2024-02-02 ENCOUNTER — Ambulatory Visit: Payer: Self-pay | Admitting: Pulmonary Disease

## 2024-02-25 ENCOUNTER — Ambulatory Visit (HOSPITAL_COMMUNITY): Payer: PRIVATE HEALTH INSURANCE

## 2024-02-25 ENCOUNTER — Inpatient Hospital Stay: Payer: PRIVATE HEALTH INSURANCE | Admitting: Internal Medicine

## 2024-02-25 ENCOUNTER — Inpatient Hospital Stay: Payer: PRIVATE HEALTH INSURANCE

## 2024-02-26 ENCOUNTER — Ambulatory Visit: Admitting: Rheumatology

## 2024-03-23 ENCOUNTER — Ambulatory Visit: Payer: PRIVATE HEALTH INSURANCE | Admitting: Pulmonary Disease

## 2024-03-25 ENCOUNTER — Ambulatory Visit: Payer: PRIVATE HEALTH INSURANCE | Admitting: Gastroenterology

## 2024-04-19 ENCOUNTER — Ambulatory Visit: Payer: PRIVATE HEALTH INSURANCE | Admitting: Rheumatology
